# Patient Record
Sex: Female | Born: 1940 | Race: White | Hispanic: No | Marital: Married | Smoking: Former smoker
Health system: Southern US, Community
[De-identification: ages and names within clinical notes are randomized; demographics above are authoritative.]

## PROBLEM LIST (undated history)

## (undated) DIAGNOSIS — G43909 Migraine, unspecified, not intractable, without status migrainosus: Secondary | ICD-10-CM

## (undated) DIAGNOSIS — H409 Unspecified glaucoma: Secondary | ICD-10-CM

## (undated) DIAGNOSIS — J449 Chronic obstructive pulmonary disease, unspecified: Secondary | ICD-10-CM

## (undated) DIAGNOSIS — I10 Essential (primary) hypertension: Secondary | ICD-10-CM

## (undated) DIAGNOSIS — M8580 Other specified disorders of bone density and structure, unspecified site: Secondary | ICD-10-CM

## (undated) DIAGNOSIS — F5104 Psychophysiologic insomnia: Secondary | ICD-10-CM

## (undated) DIAGNOSIS — M545 Low back pain, unspecified: Secondary | ICD-10-CM

## (undated) DIAGNOSIS — E669 Obesity, unspecified: Secondary | ICD-10-CM

## (undated) DIAGNOSIS — R0602 Shortness of breath: Secondary | ICD-10-CM

## (undated) DIAGNOSIS — F0390 Unspecified dementia without behavioral disturbance: Secondary | ICD-10-CM

## (undated) DIAGNOSIS — T7840XA Allergy, unspecified, initial encounter: Secondary | ICD-10-CM

## (undated) DIAGNOSIS — J9383 Other pneumothorax: Secondary | ICD-10-CM

## (undated) DIAGNOSIS — K429 Umbilical hernia without obstruction or gangrene: Principal | ICD-10-CM

## (undated) DIAGNOSIS — J189 Pneumonia, unspecified organism: Secondary | ICD-10-CM

## (undated) DIAGNOSIS — M419 Scoliosis, unspecified: Secondary | ICD-10-CM

## (undated) DIAGNOSIS — K5730 Diverticulosis of large intestine without perforation or abscess without bleeding: Secondary | ICD-10-CM

## (undated) DIAGNOSIS — K219 Gastro-esophageal reflux disease without esophagitis: Secondary | ICD-10-CM

## (undated) DIAGNOSIS — E785 Hyperlipidemia, unspecified: Secondary | ICD-10-CM

## (undated) DIAGNOSIS — Z79890 Hormone replacement therapy (postmenopausal): Secondary | ICD-10-CM

## (undated) DIAGNOSIS — F419 Anxiety disorder, unspecified: Secondary | ICD-10-CM

## (undated) DIAGNOSIS — M1990 Unspecified osteoarthritis, unspecified site: Secondary | ICD-10-CM

## (undated) DIAGNOSIS — D649 Anemia, unspecified: Secondary | ICD-10-CM

## (undated) HISTORY — DX: Migraine, unspecified, not intractable, without status migrainosus: G43.909

## (undated) HISTORY — DX: Low back pain: M54.5

## (undated) HISTORY — PX: HIP ADDUCTOR TENOTOMY: SHX1747

## (undated) HISTORY — PX: SHOULDER ARTHROSCOPY: SHX128

## (undated) HISTORY — PX: LUMBAR DISC SURGERY: SHX700

## (undated) HISTORY — DX: Essential (primary) hypertension: I10

## (undated) HISTORY — DX: Other pneumothorax: J93.83

## (undated) HISTORY — DX: Chronic obstructive pulmonary disease, unspecified: J44.9

## (undated) HISTORY — DX: Hyperlipidemia, unspecified: E78.5

## (undated) HISTORY — PX: OTHER SURGICAL HISTORY: SHX169

## (undated) HISTORY — DX: Unspecified glaucoma: H40.9

## (undated) HISTORY — DX: Obesity, unspecified: E66.9

## (undated) HISTORY — DX: Psychophysiologic insomnia: F51.04

## (undated) HISTORY — DX: Other specified disorders of bone density and structure, unspecified site: M85.80

## (undated) HISTORY — DX: Diverticulosis of large intestine without perforation or abscess without bleeding: K57.30

## (undated) HISTORY — DX: Unspecified osteoarthritis, unspecified site: M19.90

## (undated) HISTORY — DX: Anxiety disorder, unspecified: F41.9

## (undated) HISTORY — DX: Allergy, unspecified, initial encounter: T78.40XA

## (undated) HISTORY — DX: Hormone replacement therapy: Z79.890

## (undated) HISTORY — DX: Umbilical hernia without obstruction or gangrene: K42.9

## (undated) HISTORY — PX: TONSILLECTOMY: SUR1361

## (undated) HISTORY — PX: TONSILLECTOMY: SHX5217

## (undated) HISTORY — PX: EYE SURGERY: SHX253

## (undated) HISTORY — DX: Low back pain, unspecified: M54.50

## (undated) HISTORY — PX: WRIST FRACTURE SURGERY: SHX121

## (undated) HISTORY — DX: Gastro-esophageal reflux disease without esophagitis: K21.9

---

## 1999-07-10 ENCOUNTER — Ambulatory Visit (HOSPITAL_COMMUNITY): Admission: RE | Admit: 1999-07-10 | Discharge: 1999-07-10 | Payer: Self-pay | Admitting: Internal Medicine

## 1999-07-10 ENCOUNTER — Encounter: Payer: Self-pay | Admitting: Internal Medicine

## 1999-10-05 ENCOUNTER — Other Ambulatory Visit: Admission: RE | Admit: 1999-10-05 | Discharge: 1999-10-05 | Payer: Self-pay | Admitting: Internal Medicine

## 2000-10-12 ENCOUNTER — Ambulatory Visit (HOSPITAL_COMMUNITY): Admission: RE | Admit: 2000-10-12 | Discharge: 2000-10-12 | Payer: Self-pay | Admitting: Internal Medicine

## 2000-10-12 ENCOUNTER — Encounter: Payer: Self-pay | Admitting: Internal Medicine

## 2000-11-09 ENCOUNTER — Other Ambulatory Visit: Admission: RE | Admit: 2000-11-09 | Discharge: 2000-11-09 | Payer: Self-pay | Admitting: Internal Medicine

## 2002-06-10 ENCOUNTER — Encounter: Payer: Self-pay | Admitting: Internal Medicine

## 2002-06-10 ENCOUNTER — Encounter: Admission: RE | Admit: 2002-06-10 | Discharge: 2002-06-10 | Payer: Self-pay | Admitting: Internal Medicine

## 2002-06-26 ENCOUNTER — Encounter: Payer: Self-pay | Admitting: Internal Medicine

## 2002-06-26 ENCOUNTER — Encounter: Admission: RE | Admit: 2002-06-26 | Discharge: 2002-06-26 | Payer: Self-pay | Admitting: Internal Medicine

## 2002-07-17 ENCOUNTER — Encounter: Payer: Self-pay | Admitting: Specialist

## 2002-07-25 ENCOUNTER — Encounter (INDEPENDENT_AMBULATORY_CARE_PROVIDER_SITE_OTHER): Payer: Self-pay | Admitting: Specialist

## 2002-07-25 ENCOUNTER — Observation Stay (HOSPITAL_COMMUNITY): Admission: RE | Admit: 2002-07-25 | Discharge: 2002-07-26 | Payer: Self-pay | Admitting: Specialist

## 2002-07-25 ENCOUNTER — Encounter: Payer: Self-pay | Admitting: Specialist

## 2003-03-18 ENCOUNTER — Ambulatory Visit (HOSPITAL_COMMUNITY): Admission: RE | Admit: 2003-03-18 | Discharge: 2003-03-18 | Payer: Self-pay | Admitting: Ophthalmology

## 2003-07-18 ENCOUNTER — Other Ambulatory Visit: Admission: RE | Admit: 2003-07-18 | Discharge: 2003-07-18 | Payer: Self-pay | Admitting: Internal Medicine

## 2003-12-30 ENCOUNTER — Inpatient Hospital Stay (HOSPITAL_COMMUNITY): Admission: RE | Admit: 2003-12-30 | Discharge: 2003-12-31 | Payer: Self-pay | Admitting: Specialist

## 2004-07-31 ENCOUNTER — Encounter: Admission: RE | Admit: 2004-07-31 | Discharge: 2004-07-31 | Payer: Self-pay | Admitting: Internal Medicine

## 2004-08-10 ENCOUNTER — Encounter: Admission: RE | Admit: 2004-08-10 | Discharge: 2004-08-10 | Payer: Self-pay | Admitting: Internal Medicine

## 2004-12-29 ENCOUNTER — Ambulatory Visit: Payer: Self-pay | Admitting: Internal Medicine

## 2005-01-28 ENCOUNTER — Ambulatory Visit: Payer: Self-pay | Admitting: Internal Medicine

## 2005-08-05 ENCOUNTER — Encounter: Admission: RE | Admit: 2005-08-05 | Discharge: 2005-08-05 | Payer: Self-pay | Admitting: Internal Medicine

## 2005-08-24 ENCOUNTER — Ambulatory Visit: Payer: Self-pay | Admitting: Internal Medicine

## 2006-09-08 ENCOUNTER — Encounter: Admission: RE | Admit: 2006-09-08 | Discharge: 2006-09-08 | Payer: Self-pay | Admitting: Internal Medicine

## 2007-01-10 ENCOUNTER — Ambulatory Visit: Payer: Self-pay | Admitting: Internal Medicine

## 2007-01-10 LAB — CONVERTED CEMR LAB
ALT: 14 units/L (ref 0–40)
AST: 29 units/L (ref 0–37)
Alkaline Phosphatase: 128 units/L — ABNORMAL HIGH (ref 39–117)
Basophils Relative: 0.8 % (ref 0.0–1.0)
Bilirubin, Direct: 0.1 mg/dL (ref 0.0–0.3)
CO2: 29 meq/L (ref 19–32)
Calcium: 9.3 mg/dL (ref 8.4–10.5)
Creatinine, Ser: 0.8 mg/dL (ref 0.4–1.2)
Eosinophils Relative: 3.3 % (ref 0.0–5.0)
GFR calc Af Amer: 93 mL/min
Glucose, Bld: 113 mg/dL — ABNORMAL HIGH (ref 70–99)
HDL: 39.6 mg/dL (ref >39.0)
Hemoglobin: 13.4 g/dL (ref 12.0–15.0)
Lymphocytes Relative: 20.9 % (ref 12.0–46.0)
Neutro Abs: 7.3 10*3/uL (ref 1.4–7.7)
Platelets: 323 10*3/uL (ref 150–400)
RDW: 12.5 % (ref 11.5–14.6)
Total Bilirubin: 0.5 mg/dL (ref 0.3–1.2)
Total CHOL/HDL Ratio: 4.2
Total Protein: 6.8 g/dL (ref 6.0–8.3)
VLDL: 54 mg/dL — ABNORMAL HIGH (ref 0–40)
WBC: 10.4 10*3/uL (ref 4.5–10.5)

## 2007-01-23 ENCOUNTER — Ambulatory Visit: Payer: Self-pay | Admitting: Internal Medicine

## 2007-03-02 ENCOUNTER — Ambulatory Visit: Payer: Self-pay | Admitting: Internal Medicine

## 2007-03-02 LAB — CONVERTED CEMR LAB
ALT: 16 units/L (ref 0–40)
AST: 26 units/L (ref 0–37)
Albumin: 3.5 g/dL (ref 3.5–5.2)
Alkaline Phosphatase: 154 units/L — ABNORMAL HIGH (ref 39–117)
Cholesterol: 147 mg/dL (ref 0–200)
Total CHOL/HDL Ratio: 4
VLDL: 73 mg/dL — ABNORMAL HIGH (ref 0–40)

## 2007-03-28 ENCOUNTER — Ambulatory Visit: Payer: Self-pay | Admitting: Internal Medicine

## 2007-09-18 ENCOUNTER — Encounter: Admission: RE | Admit: 2007-09-18 | Discharge: 2007-09-18 | Payer: Self-pay | Admitting: Internal Medicine

## 2007-10-01 ENCOUNTER — Encounter: Payer: Self-pay | Admitting: Internal Medicine

## 2007-10-01 DIAGNOSIS — E663 Overweight: Secondary | ICD-10-CM | POA: Insufficient documentation

## 2007-10-01 DIAGNOSIS — G43909 Migraine, unspecified, not intractable, without status migrainosus: Secondary | ICD-10-CM | POA: Insufficient documentation

## 2007-10-01 DIAGNOSIS — M1990 Unspecified osteoarthritis, unspecified site: Secondary | ICD-10-CM | POA: Insufficient documentation

## 2007-10-01 DIAGNOSIS — J939 Pneumothorax, unspecified: Secondary | ICD-10-CM | POA: Insufficient documentation

## 2007-10-01 DIAGNOSIS — M810 Age-related osteoporosis without current pathological fracture: Secondary | ICD-10-CM | POA: Insufficient documentation

## 2007-10-01 DIAGNOSIS — J930 Spontaneous tension pneumothorax: Secondary | ICD-10-CM | POA: Insufficient documentation

## 2007-10-01 DIAGNOSIS — E785 Hyperlipidemia, unspecified: Secondary | ICD-10-CM

## 2007-10-01 DIAGNOSIS — M545 Low back pain: Secondary | ICD-10-CM

## 2007-10-01 DIAGNOSIS — I10 Essential (primary) hypertension: Secondary | ICD-10-CM | POA: Insufficient documentation

## 2007-10-01 DIAGNOSIS — I87309 Chronic venous hypertension (idiopathic) without complications of unspecified lower extremity: Secondary | ICD-10-CM | POA: Insufficient documentation

## 2007-10-01 DIAGNOSIS — K219 Gastro-esophageal reflux disease without esophagitis: Secondary | ICD-10-CM | POA: Insufficient documentation

## 2007-11-15 ENCOUNTER — Ambulatory Visit: Payer: Self-pay | Admitting: Internal Medicine

## 2008-09-21 ENCOUNTER — Ambulatory Visit: Payer: Self-pay | Admitting: Family Medicine

## 2008-09-21 DIAGNOSIS — J069 Acute upper respiratory infection, unspecified: Secondary | ICD-10-CM | POA: Insufficient documentation

## 2008-10-01 ENCOUNTER — Encounter: Admission: RE | Admit: 2008-10-01 | Discharge: 2008-10-01 | Payer: Self-pay | Admitting: Internal Medicine

## 2008-12-12 ENCOUNTER — Ambulatory Visit: Payer: Self-pay | Admitting: Internal Medicine

## 2008-12-12 DIAGNOSIS — R5383 Other fatigue: Secondary | ICD-10-CM

## 2008-12-12 DIAGNOSIS — R5381 Other malaise: Secondary | ICD-10-CM

## 2008-12-13 LAB — CONVERTED CEMR LAB
Bilirubin, Direct: 0.1 mg/dL (ref 0.0–0.3)
CO2: 31 meq/L (ref 19–32)
Calcium: 9.6 mg/dL (ref 8.4–10.5)
Chloride: 104 meq/L (ref 96–112)
Creatinine, Ser: 0.7 mg/dL (ref 0.4–1.2)
Eosinophils Relative: 4.4 % (ref 0.0–5.0)
Folate: >20 ng/mL
GFR calc non Af Amer: 89 mL/min
HCT: 36.7 % (ref 36.0–46.0)
HDL: 37.4 mg/dL — ABNORMAL LOW (ref >39.0)
Hgb A1c MFr Bld: 6.2 % — ABNORMAL HIGH (ref 4.6–6.0)
LDL Cholesterol: 55 mg/dL (ref 0–99)
Monocytes Absolute: 0.5 10*3/uL (ref 0.1–1.0)
Monocytes Relative: 6.9 % (ref 3.0–12.0)
Neutrophils Relative %: 53.7 % (ref 43.0–77.0)
Platelets: 296 10*3/uL (ref 150–400)
RDW: 12.7 % (ref 11.5–14.6)
Sed Rate: 29 mm/hr — ABNORMAL HIGH (ref 0–22)
Sodium: 141 meq/L (ref 135–145)
Total Bilirubin: 0.7 mg/dL (ref 0.3–1.2)
Total CHOL/HDL Ratio: 3.5
Triglycerides: 191 mg/dL — ABNORMAL HIGH (ref 0–149)
VLDL: 38 mg/dL (ref 0–40)
Vit D, 1,25-Dihydroxy: 33 (ref 30–89)
Vitamin B-12: 377 pg/mL (ref 211–911)
WBC: 7.8 10*3/uL (ref 4.5–10.5)

## 2008-12-16 ENCOUNTER — Telehealth: Payer: Self-pay | Admitting: Internal Medicine

## 2008-12-16 ENCOUNTER — Telehealth (INDEPENDENT_AMBULATORY_CARE_PROVIDER_SITE_OTHER): Payer: Self-pay | Admitting: *Deleted

## 2009-02-06 ENCOUNTER — Ambulatory Visit: Payer: Self-pay | Admitting: Internal Medicine

## 2009-02-06 DIAGNOSIS — E119 Type 2 diabetes mellitus without complications: Secondary | ICD-10-CM

## 2009-02-10 ENCOUNTER — Telehealth (INDEPENDENT_AMBULATORY_CARE_PROVIDER_SITE_OTHER): Payer: Self-pay | Admitting: *Deleted

## 2009-03-03 ENCOUNTER — Ambulatory Visit: Payer: Self-pay | Admitting: Gastroenterology

## 2009-03-17 ENCOUNTER — Ambulatory Visit: Payer: Self-pay | Admitting: Gastroenterology

## 2009-03-17 ENCOUNTER — Encounter: Payer: Self-pay | Admitting: Internal Medicine

## 2009-03-17 ENCOUNTER — Encounter: Payer: Self-pay | Admitting: Gastroenterology

## 2009-03-17 LAB — HM COLONOSCOPY

## 2009-03-18 ENCOUNTER — Encounter: Payer: Self-pay | Admitting: Gastroenterology

## 2009-05-27 ENCOUNTER — Ambulatory Visit: Payer: Self-pay | Admitting: Internal Medicine

## 2009-05-27 DIAGNOSIS — J449 Chronic obstructive pulmonary disease, unspecified: Secondary | ICD-10-CM | POA: Insufficient documentation

## 2009-06-09 ENCOUNTER — Ambulatory Visit: Payer: Self-pay | Admitting: Internal Medicine

## 2009-10-02 ENCOUNTER — Encounter: Admission: RE | Admit: 2009-10-02 | Discharge: 2009-10-02 | Payer: Self-pay | Admitting: Internal Medicine

## 2009-10-29 ENCOUNTER — Telehealth: Payer: Self-pay | Admitting: Internal Medicine

## 2009-11-04 ENCOUNTER — Ambulatory Visit: Payer: Self-pay | Admitting: Internal Medicine

## 2009-11-04 DIAGNOSIS — J019 Acute sinusitis, unspecified: Secondary | ICD-10-CM

## 2009-12-02 ENCOUNTER — Ambulatory Visit: Payer: Self-pay | Admitting: Internal Medicine

## 2009-12-02 DIAGNOSIS — F411 Generalized anxiety disorder: Secondary | ICD-10-CM | POA: Insufficient documentation

## 2009-12-02 DIAGNOSIS — M549 Dorsalgia, unspecified: Secondary | ICD-10-CM | POA: Insufficient documentation

## 2009-12-02 LAB — CONVERTED CEMR LAB
BUN: 7 mg/dL (ref 6–23)
Basophils Absolute: 0.1 10*3/uL (ref 0.0–0.1)
Basophils Relative: 0.8 % (ref 0.0–3.0)
CO2: 29 meq/L (ref 19–32)
Calcium: 9.7 mg/dL (ref 8.4–10.5)
Chloride: 102 meq/L (ref 96–112)
Cholesterol: 156 mg/dL (ref 0–200)
Creatinine, Ser: 0.7 mg/dL (ref 0.4–1.2)
Direct LDL: 70.8 mg/dL
Eosinophils Relative: 3.8 % (ref 0.0–5.0)
Glucose, Bld: 112 mg/dL — ABNORMAL HIGH (ref 70–99)
Hemoglobin: 12.9 g/dL (ref 12.0–15.0)
Lymphocytes Relative: 32.7 % (ref 12.0–46.0)
Monocytes Relative: 7.5 % (ref 3.0–12.0)
Neutro Abs: 4.5 10*3/uL (ref 1.4–7.7)
RBC: 3.93 M/uL (ref 3.87–5.11)
RDW: 12.4 % (ref 11.5–14.6)
Saturation Ratios: 24.5 % (ref 20.0–50.0)
Transferrin: 204.3 mg/dL — ABNORMAL LOW (ref 212.0–360.0)
Triglycerides: 310 mg/dL — ABNORMAL HIGH (ref 0.0–149.0)
WBC: 8.2 10*3/uL (ref 4.5–10.5)

## 2010-10-05 ENCOUNTER — Encounter: Admission: RE | Admit: 2010-10-05 | Discharge: 2010-10-05 | Payer: Self-pay | Admitting: Internal Medicine

## 2010-10-29 ENCOUNTER — Telehealth: Payer: Self-pay | Admitting: Internal Medicine

## 2010-11-18 ENCOUNTER — Encounter: Payer: Self-pay | Admitting: Internal Medicine

## 2010-11-18 ENCOUNTER — Ambulatory Visit: Payer: Self-pay | Admitting: Internal Medicine

## 2010-11-18 DIAGNOSIS — M899 Disorder of bone, unspecified: Secondary | ICD-10-CM | POA: Insufficient documentation

## 2010-11-18 DIAGNOSIS — K5730 Diverticulosis of large intestine without perforation or abscess without bleeding: Secondary | ICD-10-CM | POA: Insufficient documentation

## 2010-11-18 DIAGNOSIS — Q2733 Arteriovenous malformation of digestive system vessel: Secondary | ICD-10-CM

## 2010-11-18 DIAGNOSIS — M949 Disorder of cartilage, unspecified: Secondary | ICD-10-CM

## 2010-11-18 DIAGNOSIS — M542 Cervicalgia: Secondary | ICD-10-CM | POA: Insufficient documentation

## 2010-11-18 LAB — CONVERTED CEMR LAB
AST: 29 units/L (ref 0–37)
Albumin: 3.9 g/dL (ref 3.5–5.2)
Alkaline Phosphatase: 99 units/L (ref 39–117)
Basophils Relative: 0.9 % (ref 0.0–3.0)
CO2: 30 meq/L (ref 19–32)
Calcium: 9.8 mg/dL (ref 8.4–10.5)
Direct LDL: 100.8 mg/dL
HDL: 41.2 mg/dL (ref >39.00)
Hemoglobin: 13.3 g/dL (ref 12.0–15.0)
Lymphocytes Relative: 23.6 % (ref 12.0–46.0)
MCHC: 34.2 g/dL (ref 30.0–36.0)
Monocytes Relative: 6 % (ref 3.0–12.0)
Neutro Abs: 8.2 10*3/uL — ABNORMAL HIGH (ref 1.4–7.7)
Potassium: 4.9 meq/L (ref 3.5–5.1)
RBC: 3.99 M/uL (ref 3.87–5.11)
Sodium: 142 meq/L (ref 135–145)
Total CHOL/HDL Ratio: 4
Total Protein: 6.8 g/dL (ref 6.0–8.3)
Triglycerides: 251 mg/dL — ABNORMAL HIGH (ref 0.0–149.0)

## 2010-12-20 ENCOUNTER — Encounter: Payer: Self-pay | Admitting: Internal Medicine

## 2010-12-31 NOTE — Progress Notes (Signed)
Phone Note Refill Request Message from:  Fax from Pharmacy on October 29, 2010 8:52 AM  Refills Requested: Medication #1:  SIMVASTATIN 40 MG TABS Take 1 tablet by mouth once a day   Dosage confirmed as above?Dosage Confirmed   Last Refilled: 10/31/2009   Notes: [facility]  Medication #2:  ATENOLOL 50 MG TABS 1 by mouth once daily   Dosage confirmed as above?Dosage Confirmed   Last Refilled: 10/31/2009   Notes: [facility]  Medication #3:  BENAZEPRIL HCL 40 MG TABS 1po once daily   Dosage confirmed as above?Dosage Confirmed   Last Refilled: 10/31/2009   Notes: [facility]  Medication #4:  OMEPRAZOLE 20 MG CPDR Take 2 capsule by mouth once a day   Dosage confirmed as above?Dosage Confirmed   Last Refilled: 10/31/2009   Notes: [facility] Initial call taken by: Robin Ewing CMA (AAMA),  October 29, 2010 8:53 AM    Prescriptions: SIMVASTATIN 40 MG TABS (SIMVASTATIN) Take 1 tablet by mouth once a day  #90 x 0   Entered by:   Scharlene Gloss CMA (AAMA)   Authorized by:   Corwin Levins MD   Signed by:   Scharlene Gloss CMA (AAMA) on 10/29/2010   Method used:   Faxed to ...       [facility] (mail-order)             , [place]         Ph:        Fax: [phone]   RxID:   [id number] OMEPRAZOLE 20 MG CPDR (OMEPRAZOLE) Take 2 capsule by mouth once a day  #60 x 0   Entered by:   Zella Ball Ewing CMA (AAMA)   Authorized by:   Corwin Levins MD   Signed by:   Scharlene Gloss CMA (AAMA) on 10/29/2010   Method used:   Faxed to ...       [facility] (mail-order)             , [place]         Ph:        Fax: [phone]   RxID:   [id number] MELOXICAM 15 MG TABS (MELOXICAM) Take 1 tablet by mouth once a day  #90 x 0   Entered by:   Zella Ball Ewing CMA (AAMA)   Authorized by:   Corwin Levins MD   Signed by:   Scharlene Gloss CMA (AAMA) on 10/29/2010   Method used:   Faxed to ...       [facility] (mail-order)             , [place]         Ph:        Fax: [phone]   RxID:    [id number] FUROSEMIDE 40 MG TABS (FUROSEMIDE) 1 tablet by mouth once a day  #90 x 0   Entered by:   Zella Ball Ewing CMA (AAMA)   Authorized by:   Corwin Levins MD   Signed by:   Scharlene Gloss CMA (AAMA) on 10/29/2010   Method used:   Faxed to ...       [facility] (mail-order)             , [place]         Ph:        Fax: [phone]   RxID:   [id number] BENAZEPRIL HCL 40 MG TABS (BENAZEPRIL HCL) 1po once daily  #90 x 0   Entered  by:   Scharlene Gloss CMA (AAMA)   Authorized by:   Corwin Levins MD   Signed by:   Scharlene Gloss CMA (AAMA) on 10/29/2010   Method used:   Faxed to ...       [facility] (mail-order)             , [place]         Ph:        Fax: [phone]   RxID:   [id number] ATENOLOL 50 MG TABS (ATENOLOL) 1 by mouth once daily  #90 x 0   Entered by:   Zella Ball Ewing CMA (AAMA)   Authorized by:   Corwin Levins MD   Signed by:   Scharlene Gloss CMA (AAMA) on 10/29/2010   Method used:   Faxed to ...       [facility] (mail-order)             , [place]         Ph:        Fax: [phone]   RxID:   [id number]

## 2010-12-31 NOTE — Assessment & Plan Note (Signed)
Summary: neck pain/cd   Vital Signs:  Patient profile:   70 year old female Height:      64 inches Weight:      167 pounds BMI:     28.77 O2 Sat:      98 % on Room air Temp:     98.4 degrees F oral Pulse rate:   74 / minute BP sitting:   172 / 100  (left arm) Cuff size:   regular  Vitals Entered ByZella Ball Ewing (December 02, 2009 11:12 AM)  O2 Flow:  Room air CC: neck pain/RE   CC:  neck pain/RE.  History of Present Illness: here with 1 wk right side post neck pain with radiation to the right shoulder only - does not go further but does have some tingling to the shoulder area aswell;  no ohter RUE pain, weakness, or numbness; severity apporox 8/10;  worse at night to lie at night and not as bad during the day but still constant;  incrased pillows and heating pad and icy hot no help,  worst  is trygin to lie flat so trying to sleep with head and shoulder somewhat elevated;  some pain does radiate to the right occiput area;  massage at the begnning did seem to help somewhat but "came right back";  c/o increased stress before pain started related to family issues and ex-duaghter in law;  the 9yo child stays with the pt part time of the week;  no falls or heavy lifing prior to pain, and no fever, wt loss, and no injury involved.  Does have some "burning up" all the time "for years"  Has FROM of the right shoudler but can "feel worse" to the right upper back. Pt denies CP, sob, doe, wheezing, orthopnea, pnd, worsening LE edema, palps, dizziness or syncope  Pt denies new neuro symptoms such as headache, facial or extremity weakness , although she does c/o increased forgetfullness, but not clear if from increased stress.   Pt denies polydipsia, polyuria, or low sugar symptoms such as shakiness improved with eating.  Overall good compliance with meds, trying to follow low chol, DM diet, wt stable, little excercise however   Problems Prior to Update: 1)  Fatigue  (ICD-780.79) 2)  Anxiety   (ICD-300.00) 3)  Back Pain  (ICD-724.5) 4)  Sinusitis- Acute-nos  (ICD-461.9) 5)  COPD  (ICD-496) 6)  Routine General Medical Exam@[facility]  (ICD-V70.0) 7)  Diabetes Mellitus, Type II  (ICD-250.00) 8)  Fatigue  (ICD-780.79) 9)  Uri  (ICD-465.9) 10)  Postmenopausal On Hormone Replacement Therapy  (ICD-V07.4) 11)  Osteoporosis  (ICD-733.00) 12)  Low Back Pain  (ICD-724.2) 13)  Osteoarthritis  (ICD-715.90) 14)  Gerd  (ICD-530.81) 15)  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 1 By Mouth Once Daily 2)  Benazepril Hcl 40 Mg Tabs (Benazepril Hcl) .Marland Kitchen.. 1po Once Daily 3)  Effexor Xr 150 Mg Cp24 (Venlafaxine Hcl) .Marland Kitchen.. 1 By Mouth Two Times A Day 4)  Fosamax 70 Mg Tabs (Alendronate Sodium) .Marland Kitchen.. 1 Tablet By Mouth Once A Week 5)  Furosemide 40 Mg Tabs (Furosemide) .Marland Kitchen.. 1 Tablet By Mouth Once A Day 6)  Meloxicam 15 Mg Tabs (Meloxicam) .... Take 1 Tablet By Mouth Once A  Day 7)  Omeprazole 20 Mg Cpdr (Omeprazole) .... Take 2 Capsule By Mouth Once A Day 8)  Simvastatin 40 Mg Tabs (Simvastatin) .... Take 1 Tablet By Mouth Once A Day 9)  Cetirizine Hcl 10 Mg  Tabs (Cetirizine Hcl) .Marland Kitchen.. 1 By Mouth Once Daily Prn 10)  Ecotrin Low Strength 81 Mg  Tbec (Aspirin) .Marland Kitchen.. 1 By Mouth Qd 11)  Promethazine-Codeine 6.25-10 Mg/62ml Syrp (Promethazine-Codeine) .Marland Kitchen.. 1 Tsp By Mouth Q 6 Hrs As Needed Cough 12)  Clarithromycin 500 Mg Tabs (Clarithromycin) .Marland Kitchen.. 1 By Mouth Two Times A Day  Current Medications (verified): 1)  Atenolol 50 Mg Tabs (Atenolol) .Marland Kitchen.. 1 By Mouth Once Daily 2)  Benazepril Hcl 40 Mg Tabs (Benazepril Hcl) .Marland Kitchen.. 1po Once Daily 3)  Effexor Xr 150 Mg Cp24 (Venlafaxine Hcl) .Marland Kitchen.. 1 By Mouth Two Times A Day 4)  Fosamax 70 Mg Tabs  (Alendronate Sodium) .Marland Kitchen.. 1 Tablet By Mouth Once A Week 5)  Furosemide 40 Mg Tabs (Furosemide) .Marland Kitchen.. 1 Tablet By Mouth Once A Day 6)  Meloxicam 15 Mg Tabs (Meloxicam) .... Take 1 Tablet By Mouth Once A Day 7)  Omeprazole 20 Mg Cpdr (Omeprazole) .... Take 2 Capsule By Mouth Once A Day 8)  Simvastatin 40 Mg Tabs (Simvastatin) .... Take 1 Tablet By Mouth Once A Day 9)  Cetirizine Hcl 10 Mg  Tabs (Cetirizine Hcl) .Marland Kitchen.. 1 By Mouth Once Daily Prn 10)  Ecotrin Low Strength 81 Mg  Tbec (Aspirin) .Marland Kitchen.. 1 By Mouth Qd 11)  Flexeril 5 Mg Tabs (Cyclobenzaprine Hcl) .Marland Kitchen.. 1po Three Times A Day As Needed Pain 12)  Hydrocodone-Acetaminophen 5-325 Mg Tabs (Hydrocodone-Acetaminophen) .Marland Kitchen.. 1po Q 6 Hrs As Needed 13)  Diazepam 5 Mg Tabs (Diazepam) .... 1/2 - 1 By Mouth Two Times A Day As Needed 14)  Prednisone 10 Mg Tabs (Prednisone) .... 3po Qd For 3days, Then 2po Qd For 3days, Then 1po Qd For 3days, Then Stop  Allergies (verified): 1)  ! Pcn 2)  ! Codeine 3)  ! 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   Head:  normocephalic and atraumatic.   Eyes:  vision grossly intact, pupils equal, and pupils round.   Ears:  R ear normal and L ear normal.   Nose:  no  external deformity and no nasal discharge.   Mouth:  no gingival abnormalities and pharynx pink and moist.   Neck:  supple and no masses.  and decreased ROM to horizontal turning head to right by approx 10 - 20 degrees Lungs:  normal respiratory effort and normal breath sounds.   Heart:  normal rate and regular rhythm.   Abdomen:  soft, non-tender, and normal bowel sounds.   Msk:  has mild decrased ROM to turn head to right horizontal by about 10 degress, o/w cervical FROM; no spine tender throughout; has marked tender right  trapezoid diffusely Extremities:  no edema, no erythema  Neurologic:  cranial nerves II-XII intact, strength normal in all extremities, and sensation intact to light touch.   Psych:  moderately anxious.     Impression & Recommendations:  Problem # 1:  BACK PAIN (ICD-724.5)  Her updated medication list for this problem includes:    Meloxicam 15 Mg Tabs (Meloxicam) .Marland Kitchen... Take 1 tablet by mouth once a day    Ecotrin Low Strength 81 Mg Tbec (Aspirin) .Marland Kitchen... 1 by mouth qd    Flexeril 5 Mg Tabs (Cyclobenzaprine hcl) .Marland Kitchen... 1po three times a day as needed pain    Hydrocodone-acetaminophen 5-325 Mg Tabs (Hydrocodone-acetaminophen) .Marland Kitchen... 1po q 6 hrs as needed c/w severe trapezoid strain with some ? of underloying cervical DJD and or even radiculitis - for now tx with pain med, muscle relaxer, and pred pack; to f/u any worsening s/s   Problem # 2:  ANXIETY (ICD-300.00)  Her updated medication list for this problem includes:    Effexor Xr 150 Mg Cp24 (Venlafaxine hcl) .Marland Kitchen... 1 by mouth two times a day    Diazepam 5 Mg Tabs (Diazepam) .Marland Kitchen... 1/2 - 1 by mouth two times a day as needed for diazepam two times a day as needed, lower dose, hopeful short term pending improvememnt of family issues, declines counseling at this time. Continue all previous medications as before this visit   Problem # 3:  DIABETES MELLITUS, TYPE II (ICD-250.00)  Her updated medication list for this problem  includes:    Benazepril Hcl 40 Mg Tabs (Benazepril hcl) .Marland Kitchen... 1po once daily    Ecotrin Low Strength 81 Mg Tbec (Aspirin) .Marland Kitchen... 1 by mouth qd  Orders: TLB-Lipid Panel (80061-LIPID) TLB-BMP (Basic Metabolic Panel-BMET) (80048-METABOL) TLB-A1C / Hgb A1C (Glycohemoglobin) (83036-A1C)  Labs Reviewed: Creat: 0.7 (12/12/2008)    Reviewed HgBA1c results: 6.2 (12/12/2008) stable overall by hx and exam, ok to continue meds/tx as is   Problem # 4:  FATIGUE (ICD-780.79) exam benign, to check labs below; follow with expectant management   Orders: TLB-Sedimentation Rate (ESR) (85652-ESR) TLB-TSH (Thyroid Stimulating Hormone) (84443-TSH) TLB-IBC Pnl (Iron/FE;Transferrin) (83550-IBC) TLB-CBC Platelet - w/Differential (85025-CBCD) TLB-B12 + Folate Pnl (16109_60454-U98/JXB)  Complete Medication List: 1)  Atenolol 50 Mg Tabs (Atenolol) .Marland Kitchen.. 1 by mouth once daily 2)  Benazepril Hcl 40 Mg Tabs (Benazepril hcl) .Marland Kitchen.. 1po once daily 3)  Effexor Xr 150 Mg Cp24 (Venlafaxine hcl) .Marland Kitchen.. 1 by mouth two times a day 4)  Fosamax 70 Mg Tabs (Alendronate sodium) .Marland Kitchen.. 1 tablet by mouth once a week 5)  Furosemide 40 Mg Tabs (Furosemide) .Marland Kitchen.. 1 tablet by mouth once a day 6)  Meloxicam 15 Mg Tabs (Meloxicam) .... Take 1 tablet by mouth once a day 7)  Omeprazole 20 Mg Cpdr (Omeprazole) .... Take 2 capsule by mouth once a day 8)  Simvastatin 40 Mg Tabs (Simvastatin) .... Take 1 tablet by mouth once a day 9)  Cetirizine Hcl 10 Mg Tabs (Cetirizine hcl) .Marland Kitchen.. 1 by mouth once daily prn 10)  Ecotrin Low Strength 81 Mg Tbec (Aspirin) .Marland Kitchen.. 1 by mouth qd 11)  Flexeril 5 Mg Tabs (Cyclobenzaprine hcl) .Marland Kitchen.. 1po three times a day as needed pain 12)  Hydrocodone-acetaminophen 5-325 Mg Tabs (Hydrocodone-acetaminophen) .Marland Kitchen.. 1po q 6 hrs as needed 13)  Diazepam 5 Mg Tabs (Diazepam) .... 1/2 - 1 by mouth two times a day as needed 14)  Prednisone 10 Mg Tabs (Prednisone) .... 3po qd for 3days, then 2po qd for 3days, then 1po  qd for 3days,  then stop  Patient Instructions: 1)  Please take all new medications as prescribed 2)  Continue all previous medications as before this visit  3)  Please go to the Lab in the basement for your blood and/or urine tests today 4)  Please schedule a follow-up appointment in 6 months or sooner if needed Prescriptions: PREDNISONE 10 MG TABS (PREDNISONE) 3po qd for 3days, then 2po qd for 3days, then 1po qd for 3days, then stop  #18 x 0   Entered and Authorized by:   Corwin Levins MD   Signed by:   Corwin Levins MD on 12/02/2009   Method used:   Print then Give to Patient   RxID:   [id number] DIAZEPAM 5 MG TABS (DIAZEPAM) 1/2 - 1 by mouth two times a day as needed  #60 x 1   Entered and Authorized by:   Corwin Levins MD   Signed by:   Corwin Levins MD on 12/02/2009   Method used:   Print then Give to Patient   RxID:   [id number] FLEXERIL 5 MG TABS (CYCLOBENZAPRINE HCL) 1po three times a day as needed pain  #60 x 1   Entered and Authorized by:   Corwin Levins MD   Signed by:   Corwin Levins MD on 12/02/2009   Method used:   Print then Give to Patient   RxID:   [id number] HYDROCODONE-ACETAMINOPHEN 5-325 MG TABS (HYDROCODONE-ACETAMINOPHEN) 1po q 6 hrs as needed  #60 x 1   Entered and Authorized by:   Corwin Levins MD   Signed by:   Corwin Levins MD on 12/02/2009   Method used:   Print then Give to Patient   RxID:   [id number]

## 2010-12-31 NOTE — Assessment & Plan Note (Signed)
Summary: YEARLY MEDICARE PHYSICAL-LB   Vital Signs:  Patient profile:   70 year old female Height:      64.5 inches Weight:      160.50 pounds BMI:     27.22 O2 Sat:      96 % on Room air Temp:     97.7 degrees F oral Pulse rate:   89 / minute BP sitting:   134 / 82  (left arm) Cuff size:   regular  Vitals Entered By: Zella Ball Ewing CMA Duncan Dull) (November 18, 2010 8:48 AM)  O2 Flow:  Room air  Preventive Care Screening  Bone Density:    Date:  06/11/2009    Next Due:  05/2011    Results:  abnormal std dev  Mammogram:    Next Due:  09/2011  Last Flu Shot:    Date:  09/29/2010    Results:  given   CC: yearly/RE   CC:  yearly/RE.  History of Present Illness: here for f/u; overall doing OK. Pt denies CP, worsening sob, doe, wheezing, orthopnea, pnd, worsening LE edema, palps, dizziness or syncope  Pt denies new neuro symptoms such as headache, facial or extremity weakness  Pt denies polydipsia, polyuria, or low sugar symptoms such as shakiness improved with eating.  Overall good compliance with meds, trying to follow low chol, DM diet, wt stable, little excercise however No fever, wt loss, night sweats, loss of appetite or other constitutional symptoms  Denies worsening depressive symptoms, suicidal ideation, or panic.   Overall good compliance with meds, and good tolerability.  Pt states good ability with ADL's, low fall risk, home safety reviewed and adequate, no significant change in hearing or vision.  Does have what seems to her some spontanesou bruising to the arms only (no other overt bleeding or bruising), has some ongoing problem with getting to sleep as well as getting to sleep, with ongoing anxiety,  also some right neck achy pain, mild, without radiation , intermittent, for 3 whks, without radiatoin, UE pain/weak/numb, bowel or bladder change, gait change, other injury or fall .  Does have ongoing fatigue as well , butr no daytime somnolence  Preventive  Screening-Counseling & Management      Drug Use:  no.    Problems Prior to Update: 1)  Neck Pain, Right  (ICD-723.1) 2)  Arteriovenous Malformation, Colon  (ICD-747.61) 3)  Diverticulosis, Colon  (ICD-562.10) 4)  Osteopenia  (ICD-733.90) 5)  Fatigue  (ICD-780.79) 6)  Anxiety  (ICD-300.00) 7)  Back Pain  (ICD-724.5) 8)  Sinusitis- Acute-nos  (ICD-461.9) 9)  COPD  (ICD-496) 10)  Routine General Medical Exam@[facility]  (ICD-V70.0) 11)  Diabetes Mellitus, Type II  (ICD-250.00) 12)  Fatigue  (ICD-780.79) 13)  Uri  (ICD-465.9) 14)  Postmenopausal On Hormone Replacement Therapy  (ICD-V07.4) 15)  Osteoporosis  (ICD-733.00) 16)  Low Back Pain  (ICD-724.2) 17)  Osteoarthritis  (ICD-715.90) 18)  Gerd  (ICD-530.81) 19)  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 1 By Mouth Once Daily 2)  Benazepril Hcl 40 Mg Tabs (Benazepril Hcl) .Marland Kitchen.. 1po Once Daily 3)  Effexor Xr 150 Mg Cp24 (Venlafaxine Hcl) .Marland Kitchen.. 1 By Mouth Two Times A Day 4)  Fosamax 70 Mg Tabs (Alendronate Sodium) .Marland Kitchen.. 1 Tablet By  Mouth Once A Week 5)  Furosemide 40 Mg Tabs (Furosemide) .Marland Kitchen.. 1 Tablet By Mouth Once A Day 6)  Meloxicam 15 Mg Tabs (Meloxicam) .... Take 1 Tablet By Mouth Once A Day 7)  Omeprazole 20 Mg Cpdr (Omeprazole) .... Take 2 Capsule By Mouth Once A Day 8)  Simvastatin 40 Mg Tabs (Simvastatin) .... Take 1 Tablet By Mouth Once A Day 9)  Cetirizine Hcl 10 Mg  Tabs (Cetirizine Hcl) .Marland Kitchen.. 1 By Mouth Once Daily Prn 10)  Ecotrin Low Strength 81 Mg  Tbec (Aspirin) .Marland Kitchen.. 1 By Mouth Qd 11)  Flexeril 5 Mg Tabs (Cyclobenzaprine Hcl) .Marland Kitchen.. 1po Three Times A Day As Needed Pain 12)  Hydrocodone-Acetaminophen 5-325 Mg Tabs (Hydrocodone-Acetaminophen) .Marland Kitchen.. 1po Q 6  Hrs As Needed 13)  Diazepam 5 Mg Tabs (Diazepam) .... 1/2 - 1 By Mouth Two Times A Day As Needed 14)  Prednisone 10 Mg Tabs (Prednisone) .... 3po Qd For 3days, Then 2po Qd For 3days, Then 1po Qd For 3days, Then Stop  Current Medications (verified): 1)  Atenolol 50 Mg Tabs (Atenolol) .Marland Kitchen.. 1 By Mouth Once Daily 2)  Benazepril Hcl 40 Mg Tabs (Benazepril Hcl) .Marland Kitchen.. 1po Once Daily 3)  Effexor Xr 150 Mg Cp24 (Venlafaxine Hcl) .Marland Kitchen.. 1 By Mouth Two Times A Day 4)  Fosamax 70 Mg Tabs (Alendronate Sodium) .Marland Kitchen.. 1 Tablet By Mouth Once A Week 5)  Furosemide 40 Mg Tabs (Furosemide) .Marland Kitchen.. 1 Tablet By Mouth Once A Day 6)  Meloxicam 15 Mg Tabs (Meloxicam) .... Take 1 Tablet By Mouth Once A Day 7)  Omeprazole 20 Mg Cpdr (Omeprazole) .... Take 2 Capsule By Mouth Once A Day 8)  Simvastatin 40 Mg Tabs (Simvastatin) .... Take 1 Tablet By Mouth Once A Day 9)  Cetirizine Hcl 10 Mg  Tabs (Cetirizine Hcl) .Marland Kitchen.. 1 By Mouth Once Daily Prn 10)  Ecotrin Low Strength 81 Mg  Tbec (Aspirin) .Marland Kitchen.. 1 By Mouth Qd 11)  Flexeril 5 Mg Tabs (Cyclobenzaprine Hcl) .Marland Kitchen.. 1po Three Times A Day As Needed Pain 12)  Hydrocodone-Acetaminophen 5-325 Mg Tabs (Hydrocodone-Acetaminophen) .Marland Kitchen.. 1po Q 6 Hrs As Needed 13)  Prednisone 10 Mg Tabs (Prednisone) .... 3po Qd For 3days, Then 2po Qd For 3days, Then 1po Qd For 3days, Then Stop 14)  Vitamin D3 1000 Unit Caps (Cholecalciferol) .Marland Kitchen.. 1 By Mouth Once Daily 15)  Multivitamins  Tabs (Multiple Vitamin) .Marland Kitchen.. 1 By Mouth Once Daily 16)  Calcium 600 Mg Tabs (Calcium) .Marland Kitchen.. 1 By Mouth Two Times A Day 17)  Fish Oil 1000 Mg Caps (Omega-3 Fatty Acids) .Marland Kitchen.. 1 By Mouth Once Daily 18)  Fluconazole 150 Mg Tabs (Fluconazole) .Marland Kitchen.. 1po Once Daily Per Dr Terri Piedra For Thrush/derm 19)  Zolpidem Tartrate 5 Mg Tabs (Zolpidem Tartrate) .Marland Kitchen.. 1po At Bedtime As Needed For Sleep  Allergies (verified): 1)  ! Pcn 2)  ! Codeine 3)  ! 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   Head:  normocephalic and atraumatic.   Eyes:  vision grossly intact, pupils equal, and pupils round.   Ears:  R ear normal and L ear normal.   Nose:  no external deformity and no nasal discharge.   Mouth:  no gingival abnormalities and pharynx pink and moist.   Neck:  supple and no masses.  and decreased ROM to horizontal turning head to right by approx 10 - 20 degrees, but nontender, no swelling, no rash Lungs:  normal respiratory effort and normal breath sounds.   Heart:  normal rate and regular rhythm.   Msk:  no joint tenderness and no joint swelling.   Extremities:  no edema, no erythema  Neurologic:  strength normal in all extremities, gait normal, and DTRs symmetrical and normal.     Impression & Recommendations:  Problem # 1:  FATIGUE (ICD-780.79) Assessment Deteriorated  exam benign, to check labs below; follow with expectant management   Orders: TLB-BMP (Basic Metabolic Panel-BMET) (80048-METABOL) TLB-CBC Platelet - w/Differential (85025-CBCD) TLB-Hepatic/Liver Function  Pnl (80076-HEPATIC) TLB-TSH (Thyroid Stimulating Hormone) (84443-TSH)  Problem # 2:  NECK PAIN, RIGHT (ICD-723.1) Assessment: Deteriorated  Her updated medication list for this problem includes:    Meloxicam 15 Mg Tabs (Meloxicam) .Marland Kitchen... Take 1 tablet by mouth once a day    Ecotrin Low Strength 81 Mg Tbec (Aspirin) .Marland Kitchen... 1 by mouth qd    Flexeril 5 Mg Tabs (Cyclobenzaprine hcl) .Marland Kitchen... 1po three times a day as needed pain    Hydrocodone-acetaminophen 5-325 Mg Tabs (Hydrocodone-acetaminophen) .Marland Kitchen... 1po q 6 hrs as needed nontender , suspect underlying spinal disc dz flare;  treat as above, f/u any worsening signs or symptoms   Problem # 3:  DIABETES MELLITUS, TYPE II (ICD-250.00)  Her updated medication list for this problem includes:    Benazepril Hcl 40 Mg Tabs (Benazepril hcl) .Marland Kitchen... 1po once daily    Ecotrin Low Strength 81 Mg Tbec (Aspirin) .Marland Kitchen... 1 by mouth qd  Labs Reviewed: Creat: 0.7 (12/02/2009)    Reviewed HgBA1c results: 6.1 (12/02/2009)  6.2 (12/12/2008) stable overall by hx and exam, ok to continue meds/tx as is , Pt to cont DM diet, excercise, wt control efforts; to check labs today   Orders: TLB-A1C / Hgb A1C (Glycohemoglobin) (83036-A1C) TLB-Microalbumin/Creat Ratio, Urine (82043-MALB) TLB-Lipid Panel (80061-LIPID)  Problem # 4:  HYPERTENSION (ICD-401.9)  Her updated medication list for this problem includes:    Atenolol 50 Mg Tabs (Atenolol) .Marland Kitchen... 1 by mouth once daily    Benazepril Hcl 40 Mg Tabs (Benazepril hcl) .Marland Kitchen... 1po once daily    Furosemide 40 Mg Tabs (Furosemide) .Marland Kitchen... 1 tablet by mouth once a day  BP today: 134/82 Prior BP: 172/100 (12/02/2009)  Labs Reviewed: K+: 4.7 (12/02/2009) Creat: : 0.7 (12/02/2009)   Chol: 156 (12/02/2009)   HDL: 40.20 (12/02/2009)   LDL: 55 (12/12/2008)   TG: 310.0 (12/02/2009) stable overall by hx and exam, ok to continue meds/tx as is   Problem # 5:  HYPERLIPIDEMIA (ICD-272.4)  Her updated medication list for this problem  includes:    Simvastatin 40 Mg Tabs (Simvastatin) .Marland Kitchen... Take 1 tablet by mouth once a day  Labs Reviewed: SGOT: 29 (12/12/2008)   SGPT: 15 (12/12/2008)   HDL:40.20 (12/02/2009), 37.4 (12/12/2008)  LDL:55 (  12/12/2008), DEL (03/02/2007)  Chol:156 (12/02/2009), 131 (12/12/2008)  Trig:310.0 (12/02/2009), 191 (12/12/2008) stable overall by hx and exam, ok to continue meds/tx as is, Pt to continue diet efforts, good med tolerance; to check labs - goal LDL less than 70   Complete Medication List: 1)  Atenolol 50 Mg Tabs (Atenolol) .Marland Kitchen.. 1 by mouth once daily 2)  Benazepril Hcl 40 Mg Tabs (Benazepril hcl) .Marland Kitchen.. 1po once daily 3)  Effexor Xr 150 Mg Cp24 (Venlafaxine hcl) .Marland Kitchen.. 1 by mouth two times a day 4)  Fosamax 70 Mg Tabs (Alendronate sodium) .Marland Kitchen.. 1 tablet by mouth once a week 5)  Furosemide 40 Mg Tabs (Furosemide) .Marland Kitchen.. 1 tablet by mouth once a day 6)  Meloxicam 15 Mg Tabs (Meloxicam) .... Take 1 tablet by mouth once a day 7)  Omeprazole 20 Mg Cpdr (Omeprazole) .... Take 2 capsule by mouth once a day 8)  Simvastatin 40 Mg Tabs (Simvastatin) .... Take 1 tablet by mouth once a day 9)  Cetirizine Hcl 10 Mg Tabs (Cetirizine hcl) .Marland Kitchen.. 1 by mouth once daily prn 10)  Ecotrin Low Strength 81 Mg Tbec (Aspirin) .Marland Kitchen.. 1 by mouth qd 11)  Flexeril 5 Mg Tabs (Cyclobenzaprine hcl) .Marland Kitchen.. 1po three times a day as needed pain 12)  Hydrocodone-acetaminophen 5-325 Mg Tabs (Hydrocodone-acetaminophen) .Marland Kitchen.. 1po q 6 hrs as needed 13)  Prednisone 10 Mg Tabs (Prednisone) .... 3po qd for 3days, then 2po qd for 3days, then 1po qd for 3days, then stop 14)  Vitamin D3 1000 Unit Caps (Cholecalciferol) .Marland Kitchen.. 1 by mouth once daily 15)  Multivitamins Tabs (Multiple vitamin) .Marland Kitchen.. 1 by mouth once daily 16)  Calcium 600 Mg Tabs (Calcium) .Marland Kitchen.. 1 by mouth two times a day 17)  Fish Oil 1000 Mg Caps (Omega-3 fatty acids) .Marland Kitchen.. 1 by mouth once daily 18)  Fluconazole 150 Mg Tabs (Fluconazole) .Marland Kitchen.. 1po once daily per dr Terri Piedra for  thrush/derm 19)  Zolpidem Tartrate 5 Mg Tabs (Zolpidem tartrate) .Marland Kitchen.. 1po at bedtime as needed for sleep  Other Orders: EKG w/ Interpretation (93000)  Patient Instructions: 1)  Please take all new medications as prescribed - fhe pain medicines for the neck, and the sleeping medicine 2)  Continue all previous medications as before this visit  3)  Please go to the Lab in the basement for your blood and/or urine tests today 4)  Please call the number on the [facility] Card for results of your testing 5)  Please schedule a follow-up appointment in 6 months Prescriptions: CETIRIZINE HCL 10 MG  TABS (CETIRIZINE HCL) 1 by mouth once daily prn  #90 x 3   Entered and Authorized by:   Corwin Levins MD   Signed by:   Corwin Levins MD on 11/18/2010   Method used:   Faxed to ...       [facility] (mail-order)             , [place]         Ph: [phone]       Fax: [phone]   RxID:   [id number] SIMVASTATIN 40 MG TABS (SIMVASTATIN) Take 1 tablet by mouth once a day  #90 x 3   Entered and Authorized by:   Corwin Levins MD   Signed by:   Corwin Levins MD on 11/18/2010   Method used:   Faxed to ...       [facility] Environmental education officer)             ,  [place]         Ph: [phone]       Fax: [phone]   RxID:   [id number] OMEPRAZOLE 20 MG CPDR (OMEPRAZOLE) Take 2 capsule by mouth once a day  #180 x 3   Entered and Authorized by:   Corwin Levins MD   Signed by:   Corwin Levins MD on 11/18/2010   Method used:   Faxed to ...       [facility] (mail-order)             , [place]         Ph: [phone]       Fax: [phone]   RxID:   [id number] MELOXICAM 15 MG TABS (MELOXICAM) Take 1 tablet by mouth once a day  #90 x 3   Entered and Authorized by:   Corwin Levins MD   Signed by:   Corwin Levins MD on 11/18/2010   Method used:   Faxed to ...       [facility] (mail-order)             , [place]         Ph: [phone]       Fax: [phone]   RxID:    [id number] FUROSEMIDE 40 MG TABS (FUROSEMIDE) 1 tablet by mouth once a day  #90 x 3   Entered and Authorized by:   Corwin Levins MD   Signed by:   Corwin Levins MD on 11/18/2010   Method used:   Faxed to ...       [facility] (mail-order)             , [place]         Ph: [phone]       Fax: [phone]   RxID:   [id number] FOSAMAX 70 MG TABS (ALENDRONATE SODIUM) 1 tablet by mouth once a week  #12 x 3   Entered and Authorized by:   Corwin Levins MD   Signed by:   Corwin Levins MD on 11/18/2010   Method used:   Faxed to ...       [facility] (mail-order)             , [place]         Ph: [phone]       Fax: [phone]   RxID:   [id number] EFFEXOR XR 150 MG CP24 (VENLAFAXINE HCL) 1 by mouth two times a day  #180 x 3   Entered and Authorized by:   Corwin Levins MD   Signed by:   Corwin Levins MD on 11/18/2010   Method used:   Faxed to ...       [facility] (mail-order)             , [place]         Ph: [phone]       Fax: [phone]   RxID:   [id number] BENAZEPRIL HCL 40 MG TABS (BENAZEPRIL HCL) 1po once daily  #90 x 3   Entered and Authorized by:   Corwin Levins MD   Signed by:   Corwin Levins MD on 11/18/2010   Method used:   Faxed to ...       [facility] (mail-order)             , [place]         Ph: [phone]  Fax: [phone]   RxID:   [id number] ATENOLOL 50 MG TABS (ATENOLOL) 1 by mouth once daily  #90 x 3   Entered and Authorized by:   Corwin Levins MD   Signed by:   Corwin Levins MD on 11/18/2010   Method used:   Faxed to ...       [facility] (mail-order)             , [place]         Ph: [phone]       Fax: [phone]   RxID:   [id number] PREDNISONE 10 MG TABS (PREDNISONE) 3po qd for 3days, then 2po qd for 3days, then 1po qd for 3days, then stop  #18 x 0   Entered and Authorized by:   Corwin Levins MD   Signed by:   Corwin Levins MD on 11/18/2010   Method used:   Print then Give to Patient    RxID:   [id number] HYDROCODONE-ACETAMINOPHEN 5-325 MG TABS (HYDROCODONE-ACETAMINOPHEN) 1po q 6 hrs as needed  #60 x 1   Entered and Authorized by:   Corwin Levins MD   Signed by:   Corwin Levins MD on 11/18/2010   Method used:   Print then Give to Patient   RxID:   [id number] FLEXERIL 5 MG TABS (CYCLOBENZAPRINE HCL) 1po three times a day as needed pain  #90 x 1   Entered and Authorized by:   Corwin Levins MD   Signed by:   Corwin Levins MD on 11/18/2010   Method used:   Print then Give to Patient   RxID:   [id number] ZOLPIDEM TARTRATE 5 MG TABS (ZOLPIDEM TARTRATE) 1po at bedtime as needed for sleep  #30 x 5   Entered and Authorized by:   Corwin Levins MD   Signed by:   Corwin Levins MD on 11/18/2010   Method used:   Print then Give to Patient   RxID:   [id number]    Orders Added: 1)  EKG w/ Interpretation [93000] 2)  TLB-BMP (Basic Metabolic Panel-BMET) [80048-METABOL] 3)  TLB-CBC Platelet - w/Differential [85025-CBCD] 4)  TLB-Hepatic/Liver Function Pnl [80076-HEPATIC] 5)  TLB-TSH (Thyroid Stimulating Hormone) [84443-TSH] 6)  TLB-A1C / Hgb A1C (Glycohemoglobin) [83036-A1C] 7)  TLB-Microalbumin/Creat Ratio, Urine [82043-MALB] 8)  TLB-Lipid Panel [80061-LIPID] 9)  Est. Patient Level IV [69485]

## 2011-04-16 NOTE — H&P (Signed)
NAME:  Tara Neal, Tara Neal                              ACCOUNT NO.:  [id number]   MEDICAL RECORD NO.:  [id number]                   PATIENT TYPE:   LOCATION:                                       FACILITY:   PHYSICIAN:  [id number]                                DATE OF BIRTH:   DATE OF ADMISSION:  07/25/2002  DATE OF DISCHARGE:                                HISTORY & PHYSICAL   CHIEF COMPLAINT:  Left lower extremity radicular pain and numbness.   HISTORY OF PRESENT ILLNESS:  The patient is a 70 year old female who comes  to clinic for the left lower extremity radicular pain and numbness.  She has  had radicular pain for the last two to three months.  She denies any  specific antecedent injury.  Pain is worse with activity and better with  rest.  She has numbness on the outer aspect of the left foot.  The pain is  particularly disabling at this time, worse when she sits and better when she  stands.  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  Dr. Shelle Iron has discussed the  risks and benefits with the patient and the patient wished to proceed with  procedure.   PAST MEDICAL HISTORY:  1. Hypertension.  2. Gastroesophageal reflux disease.  3. Osteoporosis.   PAST SURGICAL HISTORY:  Tonsils, removal of a growth in her throat,  bilateral cataracts.   MEDICATIONS:  1. Atenolol 50 mg one p.o. q.d.  2. Protonix 40 mg one p.o. q.d.  3. HCTZ 25 mg one p.o. q.d.  4. Actonel 37 mg one p.o. q.week.  5. Darvocet-N 100 one p.o. t.i.d.  6. Vicodin one to two p.o. q.4-6h. p.r.n.   ALLERGIES:  PENICILLIN causes a rash, CODEINE causes nausea, SULFA causes  nausea.   SOCIAL HISTORY:  The patient is married.  She denies any alcohol, tobacco  use.  She lives in a [place] house with about a four inch step up into the  house.  Husband will be her caregiver after surgery.   FAMILY HISTORY:  Mother deceased coronary artery disease,  hypertension,  diabetes, and stroke.  Father died when she was 12 years old so medical  history not known.   REVIEW OF SYMPTOMS:  GENERAL:  Denies fevers, chills, night sweats, bleeding  tendencies.  CNS:  Positive headache.  Denies blurry/double vision,  seizures, paralysis.  RESPIRATORY:  Denies shortness of breath, productive  cough, hemoptysis.  CARDIOVASCULAR:  Denies chest pain, angina, orthopnea.  GASTROINTESTINAL:  Denies nausea, vomiting, diarrhea, constipation, melena,  bloody stools.  GENITOURINARY:  Denies dysuria, hematuria, discharge.  MUSCULOSKELETAL:  Pertinent to HPI.   PHYSICAL EXAMINATION:  VITAL SIGNS:  Blood pressure 140/80, pulse 88,  respirations 12.  GENERAL:  Well-developed, well-nourished 70 year old female.  NECK:  Supple.  No carotid bruit.  CHEST:  Clear to auscultation bilaterally.  No wheezes or crackles.  HEART:  Regular rate and rhythm.  No murmurs, rubs, or gallops.  ABDOMEN:  Soft, nontender, nondistended.  Positive bowel sounds x4.  EXTREMITIES:  The patient walks with an antalgic gait.  She is tender in the  gluteus on the left.  Straight leg raise produces marked buttock, posterior  thigh, and calf pain on the left, negative on the right.  She has decreased  sensation in S1 dermatome, slightly diminished repetitive plantar flexion  and Achilles reflex on left.  Hips, knees, and ankles all have pain with  range of motion and periphery.  Pulses are intact.  Motor is 5/5.  SKIN:  No rashes or lesions.  NEUROLOGIC:  Alert and oriented x3.   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   IMPRESSION:  1. 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  2. Hypertension.  3. Gastroesophageal reflux disease.  4. Osteoporosis.   PLAN OF ACTION:  The patient will be admitted to [facility] on  July 25, 2002 to undergo microdiskectomy at L5-S1 and excision of synovial  cyst by Dr. Jene Every.     Clarene Reamer, [place]                      [id number]    SW/MEDQ  D:  07/16/2002  T:  07/16/2002  Job:  [id number]

## 2011-04-16 NOTE — Op Note (Signed)
TNAMEAVAEH, EWER                           ACCOUNT NO.:  [id number]   MEDICAL RECORD NO.:  [id number]                   PATIENT TYPE:  AMB   LOCATION:  DAY                                  FACILITY:  [facility]   PHYSICIAN:  Javier Docker, M.D.              DATE OF BIRTH:  [birth year]   DATE OF PROCEDURE:  07/25/2002  DATE OF DISCHARGE:                                 OPERATIVE REPORT   PREOPERATIVE DIAGNOSES:  Spinal stenosis, lateral recess stenosis, synovial  cyst L5-S1 left.   POSTOPERATIVE DIAGNOSES:  Spinal stenosis, lateral recess stenosis, synovial  cyst L5-S1 left.   PROCEDURE:  Hemilaminectomy L5-S1, lateral recess decompression with S1  foraminotomy, excision of synovial cyst.   BRIEF HISTORY:  A 70 year old with S1 radiculopathy, MRI indicating synovial  cyst compressing the S1 nerve root and lateral recess stenosis. Operative  intervention was indicated for decompression of the S1 nerve root by lateral  recessed decompression, excision of the synovial cyst. The risks and  benefits were discussed including bleeding, infection, injury to  neurovascular structures, no change in symptoms, recurrence of the cyst,  return of pathology, etc.   TECHNIQUE:  The patient in the supine position after an adequate level of  anesthesia and 1 gm of Kefzol, she was placed on the [place] frame. All bony  prominences were well padded. The lumbar region was prepped and draped in  the usual sterile fashion. Two 18 gauge spinal needles were utilized to  localize the L5-S1 interspace, confirmed with x-ray. An incision was made  through the spinous process of L5-S1, subcutaneous tissue was dissected,  electrocautery utilized to achieve hemostasis. The dorsolumbar fascia  identified and divided in line with the skin incision. The paraspinous  muscles were elevated from the lamina of L5 and S1. A McCullough retractor  was placed. The operating microscope draped and brought in the  surgical  field after Penfield 4 was placed confirming the interlaminar space. I  performed a hemilaminotomy with a 2 and a 3 mm Kerrison over the caudad edge  of L5-S1 piecemeal. I detached the ligamentum flavum from the cephalad edge  of S1 with straight curette and then performed a foraminotomy of S1  enlarging the laminotomy. I identified the S1 nerve root in the foramen  distally, tracked it proximally to the pedicle. There was a large  inflammatory mass compressing against the S1 nerve root that appeared to be  emanating from the L5-S1 joint. I meticulously dissected the mass from the  thecal sac and the S1 nerve root and it was not confluent with that but  marginally adherent. After I fully dissected and mobilized the thecal sac  and the S1 nerve root from this, we piecemeal removed the mass measuring a  centimeter by a centimeter. It appeared to be a synovial cyst although  somewhat atypical, we therefore sent it to pathology for frozen section.  This was then fully excised. Bipolar electrocautery was utilized to achieve  hemostasis, there was an epidural venous plexus noted. I examined the disk  at L5-S1 and there was no evidence of herniation. The foramen of L5 was  widely patent. The S1 foramen was widely patent, the S1 nerve root was  freely mobile following the excision with a 1 cm medial excursion. The wound  was copiously irrigated, bipolar electrocautery utilized to achieve  hemostasis. Inspection revealed no evidence of CSF leakage or active  bleeding. The S1 nerve root was found to be erythematous and edematous. I  placed thrombin soaked Gelfoam in the laminotomy defect, removed the  McCullough retractor. The paraspinous muscle was inspected with no evidence  of active bleeding. The dorsolumbar fascia was reapproximated with #1 Vicryl  interrupted figure-of-eight sutures. The subcutaneous tissue was  reapproximated with 2-0 Vicryl simple sutures. The skin was  reapproximated  with 4-0 subcuticular Prolene, reinforced with Steri-Strips. A sterile  dressing was applied. She was placed supine on the hospital bed, extubated  without difficulty and transported to the recovery room in satisfactory  condition.   The patient tolerated the procedure well with no complications.                                               Javier Docker, M.D.    JCB/MEDQ  D:  07/25/2002  T:  07/26/2002  Job:  [id number]

## 2011-04-16 NOTE — Op Note (Signed)
Tara Neal, Tara Neal                            ACCOUNT NO.:  [id number]   MEDICAL RECORD NO.:  [id number]                   PATIENT TYPE:  INP   LOCATION:  2899                                 FACILITY:  [facility]   PHYSICIAN:  Jene Every, M.D.                 DATE OF BIRTH:  1941/02/23   DATE OF PROCEDURE:  12/30/2003  DATE OF DISCHARGE:                                 OPERATIVE REPORT   PREOPERATIVE DIAGNOSIS:  Spinal stenosis, recurrent L5-S1, left.   POSTOPERATIVE DIAGNOSIS:  Spinal stenosis, recurrent L5-S1, left.   PROCEDURE:  1. Redo compression L5-S1 with foraminotomies of L5 and S1, and     decompression of the L5 and the S1 nerve roots.  2. Partial medial hemi-fasciectomies.  3. Removal of synovial cyst.   ANESTHESIA:  General.   SURGEON:  Jene Every, M.D.   ASSISTANT:  Roma Schanz, P.A.   INDICATIONS FOR PROCEDURE:  This 70 year old with degenerative scoliosis and  facet arthropathy and neural foraminal stenosis.  Operative intervention was  indicated for a decompression at L5 and S1 nerve roots.  She had a previous  synovial cyst and sinus.  She had subluxation of the facet at L5-S1.  We  discussed a decompression of L5 and S1 on the left.  The risks and limits  have been discussed, including bleeding, infection, hemorrhage, no change in  the symptoms, worsening of symptoms, need for a full fusion of the  degenerative scoliosis in the future.  We are trying to avoid that.   DESCRIPTION OF PROCEDURE:  With the patient in the supine position after the  induction of general anesthesia and 500 mg of vancomycin.  She was placed  prone on the [place] frame and all bony prominences were well-padded.  The  lumbar area was prepped and draped in the usual sterile fashion.  The  previous surgical incision was utilized.  We extended the incision cephalad  and caudad.  The subcutaneous tissue was checked by __________  to achieve  hemostasis.  We divided the fascia in  line with the skin incision.  The  paraspinous muscles were elevated from the lamina of 5 and partial of 4 and  of S-1.  Partially elevated the paraspinous musculature on the right.  A  Kocher was placed and two radiographs were obtained, demonstrating the 4 and  the 5 spinous processes.  These were then removed.  She had an increased  lumbosacral angle.  The McCullough retractor was placed.  The operating  microscope was draped and brought onto the surgical field.  First a curet  was utilized to skeletonize the lamina of L5-S1.  The facet was  hypertrophied.  It was first subluxed.  I performed a hemilaminotomy of the  caudad edge of L5 with a 2 mm and a 3 mm Kerrison, entering the joint of L5-  S1.  There was a large synovial  cyst extending into the lateral recess at L5-  S1.  I undercut the facet and the superior articular process of S1 with a 2  mm Kerrison.  It was found to project into the L5 foramen.  L5 and S1  stenosis with synovial cyst as well compressing the lateral recess.  We  removed the cephalad edge of the superior articular facet of S1, utilizing a  2 mm Kerrison.  We removed the hypertrophic ligament of flavum as well as a  synovial cyst.  Bipolar cautery was utilized to achieve hemostasis over the  venous plexus around the L5 root.  __________  were placed out in the L5  foramen and found to be widely patent following the foraminotomy.  We  decompressed the lateral recess of L5 and S1.  The disk as expected, no  evidence of herniation.  A foraminotomy was performed at S1, decompressing  the S1 nerve root as well.  After the decompression, the S1 and L5 nerve  roots were fully mobile with no residual compression.  Bipolar current was  utilized to achieve strict hemostasis.  Again re-inspection revealed old  decompression of the L5 root.  We could see the pedicle of L5 and of S1.  There was no residual S tissue.  Next, we copiously irrigated with  antibiotic irrigation.   Thrombin-soaked Gelfoam was placed in the laminar  defect.  Electrocautery was utilized to achieve strict hemostasis.  We  removed the [facility] retractor.  The paraspinous muscle was __________  .  Electrocautery was utilized to achieve hemostasis.  Placed a Hemovac drain  and brought out through a lateral stab wound in the skin.  Repaired the  dorsal lumbar fascia with #1 Vicryl in figure-of-eight sutures.  The  subcutaneous tissue reapproximated with #2-0 Vicryl .  The skin was  reapproximated with staples.  The wound was dressed sterilely.  She was  placed supine on the hospital bed and extubated without difficulty, and  transported to the recovery room in satisfactory condition.   There were no complications.  The estimated blood loss was approximately 50  mL.                                               Jene Every, M.D.    Cordelia Pen  D:  12/30/2003  T:  12/30/2003  Job:  [id number]

## 2011-04-16 NOTE — Op Note (Signed)
Tara Neal, Tara Neal                            ACCOUNT NO.:  [id number]   MEDICAL RECORD NO.:  [id number]                   PATIENT TYPE:  OIB   LOCATION:  5727                                 FACILITY:  [facility]   PHYSICIAN:  Alford Highland. Rankin, M.D.                DATE OF BIRTH:  December 18, 1940   DATE OF PROCEDURE:  03/18/2003  DATE OF DISCHARGE:  03/18/2003                                 OPERATIVE REPORT   PREOPERATIVE DIAGNOSES:  1. Epiretinal membrane, left eye, with macular topographic distortion.  2. Stage 1 macular hole, left eye, secondary to #1.   POSTOPERATIVE DIAGNOSES:  1. Epiretinal membrane, left eye, with macular topographic distortion.  2. Stage 1 macular hole, left eye, secondary to #1.   PROCEDURE:  Posterior vitrectomy with membrane peel, left eye.   SURGEON:  Alford Highland. Rankin, M.D.   ANESTHESIA:  General endotracheal anesthesia.   INDICATION FOR PROCEDURE:  The patient is a 70 year old woman with  significant impairment of activities of daily living on the basis of visual  acuity impairment from epiretinal membrane with topographic distortion of  the left eye affecting reading ability.  This is an attempt to surgically  remove and release the traction and the macular hole.  The patient  understands the risks of anesthesia, including the rare occurrence of death,  and loss to the eye including but not limited to hemorrhage, infection,  scarring, need for additional surgery, no change in vision, loss of vision,  and progressive disease despite intervention.  An appropriate signed consent  was obtained.   DESCRIPTION OF PROCEDURE:  She was taken to the operating room. In the  operating room general endotracheal anesthesia administered without  difficulty.  The left periocular region was sterilely prepped and draped in  the usual ophthalmic fashion.  A lid speculum applied.  Conjunctival  peritomy fashioned temporally and superonasally.  A 4 mm infusion was  secured  3.5 mm posterior to the limbus in the inferotemporal quadrant.  Placement in the vitreous cavity verified visually.  Superior sclerotomies  were then fashioned.  The wall microscope placed in position and BIOM  attached.  Core vitrectomy was then begun.  Iatrogenic posterior vitreous  detachment was then created.  Vitreous skirt elevated 360 degrees.  DORC  forceps were then used to engage the internal limiting membrane.  There was  a thin layer of cortical vitreous and this was removed, followed by  epiretinal membrane and the internal limiting membrane, which was removed as  a single sheet overlying the foveal region.  No complications occurred.  The  hole did not open.  There was no loss of the roof.  At this time it was  deemed not necessary to perform fluid-air exchange.  The peripheral retina  was inspected and found to be free of retinal holes or tears.  The superior  sclerotomy was then closed with  7-0 Vicryl suture.  The infusion removed and  similarly closed with 7-0 Vicryl suture.  Conjunctiva closed with 7-0 Vicryl  suture.  Subconjunctival injections of steroid applied.  Intravenous Cipro  given for prophylaxis.  The patient tolerated the procedure well without  complications.                                               Alford Highland Rankin, M.D.   GAR/MEDQ  D:  03/18/2003  T:  03/19/2003  Job:  [id number]

## 2011-04-16 NOTE — H&P (Signed)
NAMEARRIE, BORRELLI                            ACCOUNT NO.:  [id number]   MEDICAL RECORD NO.:  [id number]                   PATIENT TYPE:  INP   LOCATION:  5003                                 FACILITY:  [facility]   PHYSICIAN:  Jene Every, M.D.                 DATE OF BIRTH:  [birth year]   DATE OF ADMISSION:  12/30/2003  DATE OF DISCHARGE:                                HISTORY & PHYSICAL   CHIEF COMPLAINT:  Left lower extremity pain.   HISTORY:  Ms. Urquidi is a 70 year old female who was treated in 2003 for a  left lower extremity pain.  In August 2003, she underwent a lateral recess  decompression and excision of synovial cyst at the L5-S1 level.  The patient  did quite well for some time but noticed a slow reoccurrence of her  symptoms.  The patient underwent conservative treatment including selective  nerve root blocks, which did help for some time, however, she did note  worsening of her symptoms to the point where it was disabling.  Different  treatment options were discussed with the patient including continued  analgesics as well as continued selective nerve root blocks versus surgical  intervention.  The risks and benefits of this surgery were discussed with  the patient and she wishes to proceed with a foraminotomy at the L5-S1  level.   MEDICAL HISTORY:  Medical history is significant for:  1. Hypertension.  2. Gastroesophageal reflux disease.  3. Hyperlipidemia.   CURRENT MEDICATIONS:  1. Atenolol 50 mg 1 p.o. daily.  2. Actonel 35 mg 1 p.o. q.wk.  3. Protonix 40 mg 1 p.o. daily.  4. Lasix 40 mg 1 p.o. daily.  5. Effexor XR 150 mg 1 p.o. daily.  6. Mobic 7.5 mg 1 p.o. daily.  7. Hydrocodone 5/500 mg p.r.n.  8. Zanaflex 4 mg p.r.n.  9. Levaquin 500 mg daily for recent cold.  10.      Bromfenex 1 or 2 b.i.d.   ALLERGIES:  Allergies include CODEINE, SULFA and PENICILLIN which cause  nausea and vomiting as well as a rash.   PREVIOUS SURGERIES:  1. Tonsillectomy.  2. Cataracts bilaterally.  3. Lung collapse many years ago.  4. Retinal repair in 2004.   SOCIAL HISTORY:  The patient is married.  She is a housewife.  She does not  drink any alcohol.  She did smoke for approximately 20 years but has also  stopped for the past 20 years.  Husband will be her care-giver following  surgery.   FAMILY HISTORY:  Mother significant for coronary artery disease,  hypertension, diabetes, CVA as well as osteoarthritis.   REVIEW OF SYSTEMS:  GENERAL:  The patient denies any fevers, chills, night  sweats or bleeding tendencies.  CNS:  No blurred or double vision, seizure,  headache or paralysis.  RESPIRATORY:  No shortness of breath, productive  cough or hemoptysis.  CARDIAC:  No chest pain, angina or orthopnea.  GU:  No  dysuria, hematuria or discharge.  GI:  No nausea, vomiting, diarrhea,  constipation, melena or bloody stools.  MUSCULOSKELETAL:  Pertinent as in  HPI.   PHYSICAL EXAMINATION:  VITAL SIGNS:  On physical exam, pulse is 76,  respirations 16, BP 140/90.  GENERAL:  This is a well-developed, well-nourished 70 year old female in  mild distress.  HEENT:  Atraumatic, normocephalic.  Pupils equal, round and reactive to  light.  EOMs intact.  NECK:  Neck supple with no lymphadenopathy.  CHEST:  Chest clear to auscultation bilaterally.  No rhonchi, wheezes or  rales.  BREASTS AND GU:  Not examined, not pertinent to HPI.  HEART:  Regular rate and rhythm without murmurs, gallops or rubs.  ABDOMEN:  Abdomen is soft, nontender and nondistended.  Bowel sounds x4.  SKIN:  No rashes or lesions are noted.  EXTREMITIES:  The patient does have positive straight leg raise on the left  that produces buttocks tissue,  thigh and calf pain.  EHL is __________  in  comparison to the contralateral side.  Sensation is intact to light touch.   IMPRESSION:  Left lower extremity radicular pain at the L5 nerve root  distribution secondary to spinal stenosis.   PLAN:   The patient will undergo a lateral recess decompression with  foraminotomy at L5 on the left with partial facetectomy.  The patient did  receive medical clearance from [facility].      Roma Schanz, P.A.                   Jene Every, M.D.    CS/MEDQ  D:  12/31/2003  T:  12/31/2003  Job:  [id number]

## 2011-05-17 ENCOUNTER — Other Ambulatory Visit: Payer: Self-pay | Admitting: Internal Medicine

## 2011-05-17 NOTE — Telephone Encounter (Signed)
Faxed hardcopy to pharmacy. 

## 2011-06-07 ENCOUNTER — Other Ambulatory Visit: Payer: Self-pay | Admitting: Internal Medicine

## 2011-06-07 NOTE — Telephone Encounter (Signed)
Faxed hardcopy to pharmacy. 

## 2011-09-01 ENCOUNTER — Other Ambulatory Visit: Payer: Self-pay | Admitting: Internal Medicine

## 2011-09-01 DIAGNOSIS — Z1231 Encounter for screening mammogram for malignant neoplasm of breast: Secondary | ICD-10-CM

## 2011-10-13 ENCOUNTER — Ambulatory Visit
Admission: RE | Admit: 2011-10-13 | Discharge: 2011-10-13 | Disposition: A | Discharge status: Home / Self Care | Payer: Medicare Other | Source: Ambulatory Visit | Attending: Internal Medicine | Admitting: Internal Medicine

## 2011-10-13 DIAGNOSIS — Z1231 Encounter for screening mammogram for malignant neoplasm of breast: Secondary | ICD-10-CM

## 2011-12-03 ENCOUNTER — Other Ambulatory Visit: Payer: Self-pay

## 2011-12-03 MED ORDER — ATENOLOL 50 MG PO TABS: 50.0000 mg | ORAL_TABLET | Freq: Every day | ORAL | Status: DC

## 2011-12-03 MED ORDER — OMEPRAZOLE 20 MG PO CPDR: 20.0000 mg | DELAYED_RELEASE_CAPSULE | Freq: Every day | ORAL | Status: DC

## 2011-12-03 MED ORDER — FUROSEMIDE 40 MG PO TABS: 40.0000 mg | ORAL_TABLET | Freq: Every day | ORAL | Status: DC

## 2011-12-03 MED ORDER — SIMVASTATIN 40 MG PO TABS: 40.0000 mg | ORAL_TABLET | Freq: Every day | ORAL | Status: DC

## 2011-12-11 ENCOUNTER — Encounter: Payer: Self-pay | Admitting: Internal Medicine

## 2011-12-11 DIAGNOSIS — Z0000 Encounter for general adult medical examination without abnormal findings: Secondary | ICD-10-CM | POA: Insufficient documentation

## 2011-12-13 ENCOUNTER — Encounter: Payer: Self-pay | Admitting: Internal Medicine

## 2011-12-13 ENCOUNTER — Other Ambulatory Visit (INDEPENDENT_AMBULATORY_CARE_PROVIDER_SITE_OTHER): Payer: Medicare Other

## 2011-12-13 ENCOUNTER — Ambulatory Visit (INDEPENDENT_AMBULATORY_CARE_PROVIDER_SITE_OTHER): Payer: Medicare Other | Admitting: Internal Medicine

## 2011-12-13 ENCOUNTER — Other Ambulatory Visit: Payer: Self-pay | Admitting: Internal Medicine

## 2011-12-13 VITALS — BP 162/100 | HR 75 | Temp 97.9°F | Ht 64.0 in | Wt 150.1 lb

## 2011-12-13 DIAGNOSIS — R5381 Other malaise: Secondary | ICD-10-CM | POA: Diagnosis not present

## 2011-12-13 DIAGNOSIS — M25559 Pain in unspecified hip: Secondary | ICD-10-CM | POA: Diagnosis not present

## 2011-12-13 DIAGNOSIS — E119 Type 2 diabetes mellitus without complications: Secondary | ICD-10-CM

## 2011-12-13 DIAGNOSIS — E785 Hyperlipidemia, unspecified: Secondary | ICD-10-CM

## 2011-12-13 DIAGNOSIS — R5383 Other fatigue: Secondary | ICD-10-CM | POA: Diagnosis not present

## 2011-12-13 DIAGNOSIS — M25561 Pain in right knee: Secondary | ICD-10-CM

## 2011-12-13 DIAGNOSIS — M25551 Pain in right hip: Secondary | ICD-10-CM | POA: Insufficient documentation

## 2011-12-13 DIAGNOSIS — I10 Essential (primary) hypertension: Secondary | ICD-10-CM

## 2011-12-13 DIAGNOSIS — M25569 Pain in unspecified knee: Secondary | ICD-10-CM

## 2011-12-13 LAB — CBC WITH DIFFERENTIAL/PLATELET
Basophils Absolute: 0.1 10*3/uL (ref 0.0–0.1)
Eosinophils Relative: 1.6 % (ref 0.0–5.0)
HCT: 37.3 % (ref 36.0–46.0)
Hemoglobin: 12.7 g/dL (ref 12.0–15.0)
Lymphs Abs: 3.3 10*3/uL (ref 0.7–4.0)
MCV: 96.5 fl (ref 78.0–100.0)
Monocytes Absolute: 0.7 10*3/uL (ref 0.1–1.0)
Neutro Abs: 6.5 10*3/uL (ref 1.4–7.7)
Platelets: 328 10*3/uL (ref 150.0–400.0)
RDW: 12.9 % (ref 11.5–14.6)

## 2011-12-13 LAB — HEPATIC FUNCTION PANEL
AST: 23 U/L (ref 0–37)
Alkaline Phosphatase: 88 U/L (ref 39–117)
Total Bilirubin: 0.6 mg/dL (ref 0.3–1.2)

## 2011-12-13 LAB — URINALYSIS, ROUTINE W REFLEX MICROSCOPIC
Bilirubin Urine: NEGATIVE
Ketones, ur: NEGATIVE
Leukocytes, UA: NEGATIVE
Specific Gravity, Urine: 1.01 (ref 1.000–1.030)
Urine Glucose: NEGATIVE
pH: 6 (ref 5.0–8.0)

## 2011-12-13 LAB — BASIC METABOLIC PANEL
Calcium: 9.3 mg/dL (ref 8.4–10.5)
GFR: 73.26 mL/min (ref >60.00)
Potassium: 4.4 mEq/L (ref 3.5–5.1)
Sodium: 139 mEq/L (ref 135–145)

## 2011-12-13 LAB — LIPID PANEL
HDL: 44.9 mg/dL (ref >39.00)
Total CHOL/HDL Ratio: 4
VLDL: 56.4 mg/dL — ABNORMAL HIGH (ref 0.0–40.0)

## 2011-12-13 LAB — TSH: TSH: 0.96 u[IU]/mL (ref 0.35–5.50)

## 2011-12-13 MED ORDER — BENAZEPRIL HCL 40 MG PO TABS: 40.0000 mg | ORAL_TABLET | Freq: Every day | ORAL | Status: DC

## 2011-12-13 MED ORDER — FUROSEMIDE 40 MG PO TABS: 40.0000 mg | ORAL_TABLET | Freq: Every day | ORAL | Status: DC

## 2011-12-13 MED ORDER — HYDROCODONE-ACETAMINOPHEN 5-325 MG PO TABS: ORAL_TABLET | ORAL | Status: DC

## 2011-12-13 MED ORDER — ALENDRONATE SODIUM 70 MG PO TABS: 70.0000 mg | ORAL_TABLET | ORAL | Status: DC

## 2011-12-13 MED ORDER — SIMVASTATIN 40 MG PO TABS: 40.0000 mg | ORAL_TABLET | Freq: Every day | ORAL | Status: DC

## 2011-12-13 MED ORDER — MELOXICAM 15 MG PO TABS: 15.0000 mg | ORAL_TABLET | Freq: Every day | ORAL | Status: DC

## 2011-12-13 MED ORDER — ZOLPIDEM TARTRATE 5 MG PO TABS: 5.0000 mg | ORAL_TABLET | Freq: Every evening | ORAL | Status: DC | PRN

## 2011-12-13 MED ORDER — ATENOLOL 50 MG PO TABS: 50.0000 mg | ORAL_TABLET | Freq: Every day | ORAL | Status: DC

## 2011-12-13 MED ORDER — VENLAFAXINE HCL ER 150 MG PO CP24: 150.0000 mg | ORAL_CAPSULE | Freq: Two times a day (BID) | ORAL | Status: DC

## 2011-12-13 MED ORDER — OMEPRAZOLE 20 MG PO CPDR: 20.0000 mg | DELAYED_RELEASE_CAPSULE | Freq: Every day | ORAL | Status: DC

## 2011-12-13 NOTE — Assessment & Plan Note (Signed)
stable overall by hx and exam, most recent data reviewed with pt, and pt to continue medical treatment as before  Lab Results  Component Value Date   HGBA1C 6.0 11/18/2010

## 2011-12-13 NOTE — Assessment & Plan Note (Signed)
stable overall by hx and exam, most recent data reviewed with pt, and pt to continue medical treatment as before  Lab Results  Component Value Date   LDLCALC 55 12/12/2008

## 2011-12-13 NOTE — Patient Instructions (Addendum)
Continue all other medications as before Please go to LAB in the Basement for the blood and/or urine tests to be done today Please call the phone number [phone] (the PhoneTree System) for results of testing in 2-3 days;  When calling, simply dial the number, and when prompted enter the MRN number above (the Medical Record Number) and the # key, then the message should start. You will be contacted regarding the referral for: Dr Lequita Halt for the right hip All of the refills were sent to [facility], except for the hydrocodone pain medication Please return in 6 months, or sooner if needed

## 2011-12-13 NOTE — Assessment & Plan Note (Signed)
Etiology unclear, Exam otherwise benign, to check labs as documented, follow with expectant management  

## 2011-12-13 NOTE — Assessment & Plan Note (Signed)
C/ post traumatic swelling after twist and fall episode, o/w stable, Continue all other medications as before, should improve further over next 2-3 wks, consider ortho if pain persists or worsens

## 2011-12-13 NOTE — Assessment & Plan Note (Signed)
stable overall by hx and exam, most recent data reviewed with pt, and pt to continue medical treatment as before, follow closely as BP elev today likely situational

## 2011-12-13 NOTE — Progress Notes (Signed)
Subjective:    Patient ID: Tara Neal, female    DOB: 14-Sep-1941, 71 y.o.   MRN: [id number]  HPI  Here with several complaints; has ongoing 3-4 months moderate pain to right lateral hip area, worse to palpate and lie on right side, worse to walk, not better with anything, no radiation.  Also with a pain/soreness to the anteromed right knee with puffiness after an episode of off balance and fell backwards without other injury but believes she twisted the knee, pain overall mild, no radiation, worse to walk and palpate and improving, but concerned since it has lasted 2 wks.  Also mentions a pain to the right groin, mild, intermittent for several wks assoc with numbness to the anterior thigh, but wouldn't have mentioned it except for the other two pains on the same side. No back pain and no bowel or bladder change, fever, wt loss,  worsening LE pain/numbness/weakness, gait change or other falls. Pt denies chest pain, increased sob or doe, wheezing, orthopnea, PND, increased LE swelling, palpitations, dizziness or syncope.  Pt denies new neurological symptoms such as new headache, or facial or extremity weakness or numbness except for the above.   Pt denies polydipsia, polyuria, or low sugar symptoms such as weakness or confusion improved with po intake.  Pt states overall good compliance with meds, trying to follow lower cholesterol, diabetic diet, wt overall stable but little exercise however.     Does have sense of ongoing fatigue, but denies signficant hypersomnolence. Past Medical History  Diagnosis Date  . Hyperlipidemia   . Hypertension   . Mild obesity   . Migraine headache   . Spontaneous pneumothorax   . GERD (gastroesophageal reflux disease)   . Osteoarthritis   . Low back pain   . Osteoporosis   . Hormone replacement therapy (postmenopausal)   . Diabetes mellitus   . Anxiety   . Osteopenia   . COPD (chronic obstructive pulmonary disease)   . Diverticulosis of colon    Past Surgical  History  Procedure Date  . Tonsillectomy   . Lumbar disc surgery      x 2  . Eye surgery     cataract extraction    reports that she has quit smoking. She does not have any smokeless tobacco history on file. She reports that she does not drink alcohol or use illicit drugs. family history includes Diabetes in her mother; Heart disease in her mother; and Hyperlipidemia in her mother. Allergies  Allergen Reactions  . Codeine     REACTION: Nausea  . Penicillins     REACTION: Rash  . Sulfonamide Derivatives     REACTION: Nausea   Current Outpatient Prescriptions on File Prior to Visit  Medication Sig Dispense Refill  . calcium carbonate (OS-CAL) 600 MG TABS Take 600 mg by mouth 2 (two) times daily with a meal.      . cetirizine (ZYRTEC) 10 MG tablet Take 10 mg by mouth daily.      . Cholecalciferol (VITAMIN D3) 1000 UNITS CAPS Take 1 capsule by mouth daily.      . Multiple Vitamin (MULTIVITAMIN) tablet Take 1 tablet by mouth daily.      Marland Kitchen aspirin 81 MG tablet Take 160 mg by mouth daily.       Review of Systems Review of Systems  Constitutional: Negative for diaphoresis and unexpected weight change.  HENT: Negative for drooling and tinnitus.   Eyes: Negative for photophobia and visual disturbance.  Respiratory: Negative for choking  and stridor.   Gastrointestinal: Negative for vomiting and blood in stool.  Genitourinary: Negative for hematuria and decreased urine volume.  Musculoskeletal: Negative for gait problem.  Skin: Negative for color change and wound.  Neurological: Negative for tremors and numbness.  Psychiatric/Behavioral: Negative for decreased concentration. The patient is not hyperactive.       Objective:   Physical Exam BP 162/100  Pulse 75  Temp(Src) 97.9 F (36.6 C) (Oral)  Ht 5\' 4"  (1.626 m)  Wt 150 lb 2 oz (68.096 kg)  BMI 25.77 kg/m2  SpO2 97% Physical Exam  VS noted Constitutional: Pt appears well-developed and well-nourished.  HENT: Head:  Normocephalic.  Right Ear: External ear normal.  Left Ear: External ear normal.  Eyes: Conjunctivae and EOM are normal. Pupils are equal, round, and reactive to light.  Neck: Normal range of motion. Neck supple.  Cardiovascular: Normal rate and regular rhythm.   Pulmonary/Chest: Effort normal and breath sounds normal.  Abd:  Soft, NT, non-distended, + BS Neurological: Pt is alert. No cranial nerve deficit.  Skin: Skin is warm. No erythema.  Psychiatric: Pt behavior is normal. Thought content normal.  Right knee with mild tender/swelling medial joint line o/w FROM, NT Tender over right lateral ihp over greater trochanter Right hip with FROM    Assessment & Plan:

## 2011-12-13 NOTE — Assessment & Plan Note (Signed)
C/w bursitis, cont pain med, for ortho referral - dr Lequita Halt per pt request, also with ? Element of right hip joint DJD and/or meralgia, .follwoj

## 2011-12-14 LAB — MICROALBUMIN / CREATININE URINE RATIO: Microalb Creat Ratio: 1.2 mg/g (ref 0.0–30.0)

## 2011-12-14 LAB — LDL CHOLESTEROL, DIRECT: Direct LDL: 79.6 mg/dL

## 2011-12-20 DIAGNOSIS — H5315 Visual distortions of shape and size: Secondary | ICD-10-CM | POA: Diagnosis not present

## 2011-12-20 DIAGNOSIS — Z961 Presence of intraocular lens: Secondary | ICD-10-CM | POA: Diagnosis not present

## 2012-01-12 ENCOUNTER — Telehealth: Payer: Self-pay

## 2012-01-12 NOTE — Telephone Encounter (Signed)
Patient did check with insurance company on the shingles shot.  Please call in rx for it at [facility] and [address] as insurance will pay. Call back number when complete [phone]

## 2012-01-12 NOTE — Telephone Encounter (Signed)
Order done hardcopy on rx paper to [facility] to fax

## 2012-01-12 NOTE — Telephone Encounter (Signed)
Called left message to call back 

## 2012-01-12 NOTE — Telephone Encounter (Signed)
Faxed rx to [facility] and [address] Called informed the patient

## 2012-01-13 ENCOUNTER — Telehealth: Payer: Self-pay

## 2012-01-13 NOTE — Telephone Encounter (Signed)
Patient called to inform [facility] did not receive rx faxed 01/12/12  for shingles vaccine. The patient was informed to pickup rx at front desk to take to pharmacy.

## 2012-01-14 DIAGNOSIS — M25569 Pain in unspecified knee: Secondary | ICD-10-CM | POA: Diagnosis not present

## 2012-01-14 DIAGNOSIS — M1710 Unilateral primary osteoarthritis, unspecified knee: Secondary | ICD-10-CM | POA: Diagnosis not present

## 2012-01-14 DIAGNOSIS — M25559 Pain in unspecified hip: Secondary | ICD-10-CM | POA: Diagnosis not present

## 2012-02-24 DIAGNOSIS — M25559 Pain in unspecified hip: Secondary | ICD-10-CM | POA: Diagnosis not present

## 2012-03-01 DIAGNOSIS — M25559 Pain in unspecified hip: Secondary | ICD-10-CM | POA: Diagnosis not present

## 2012-03-07 DIAGNOSIS — M25559 Pain in unspecified hip: Secondary | ICD-10-CM | POA: Diagnosis not present

## 2012-03-09 ENCOUNTER — Other Ambulatory Visit: Payer: Self-pay

## 2012-03-09 DIAGNOSIS — M25559 Pain in unspecified hip: Secondary | ICD-10-CM | POA: Diagnosis not present

## 2012-03-09 MED ORDER — FUROSEMIDE 40 MG PO TABS: 40.0000 mg | ORAL_TABLET | Freq: Every day | ORAL | Status: DC

## 2012-03-09 MED ORDER — OMEPRAZOLE 20 MG PO CPDR: 20.0000 mg | DELAYED_RELEASE_CAPSULE | Freq: Every day | ORAL | Status: DC

## 2012-03-09 MED ORDER — VENLAFAXINE HCL ER 150 MG PO CP24: 150.0000 mg | ORAL_CAPSULE | Freq: Two times a day (BID) | ORAL | Status: DC

## 2012-03-09 MED ORDER — ATENOLOL 50 MG PO TABS: 50.0000 mg | ORAL_TABLET | Freq: Every day | ORAL | Status: DC

## 2012-03-09 MED ORDER — SIMVASTATIN 40 MG PO TABS: 40.0000 mg | ORAL_TABLET | Freq: Every day | ORAL | Status: DC

## 2012-03-14 DIAGNOSIS — M25559 Pain in unspecified hip: Secondary | ICD-10-CM | POA: Diagnosis not present

## 2012-03-16 DIAGNOSIS — M25559 Pain in unspecified hip: Secondary | ICD-10-CM | POA: Diagnosis not present

## 2012-03-21 DIAGNOSIS — M25559 Pain in unspecified hip: Secondary | ICD-10-CM | POA: Diagnosis not present

## 2012-03-23 DIAGNOSIS — M25569 Pain in unspecified knee: Secondary | ICD-10-CM | POA: Diagnosis not present

## 2012-03-28 DIAGNOSIS — M25569 Pain in unspecified knee: Secondary | ICD-10-CM | POA: Diagnosis not present

## 2012-04-03 DIAGNOSIS — M25569 Pain in unspecified knee: Secondary | ICD-10-CM | POA: Diagnosis not present

## 2012-04-11 DIAGNOSIS — M25559 Pain in unspecified hip: Secondary | ICD-10-CM | POA: Diagnosis not present

## 2012-04-13 ENCOUNTER — Other Ambulatory Visit: Payer: Self-pay | Admitting: Orthopedic Surgery

## 2012-04-13 ENCOUNTER — Encounter (HOSPITAL_COMMUNITY): Payer: Self-pay | Admitting: Pharmacy Technician

## 2012-04-13 NOTE — Progress Notes (Signed)
Preoperative surgical orders have been place into the [facility] hospital system for Tara Neal on 04/13/2012, 5:34 PM  by Patrica Duel for surgery on 04/26/2012.  Preop Hip orders including Experel Injecion, IV Tylenol, and IV Decadron as long as there are no contraindications to the above medications.

## 2012-04-18 ENCOUNTER — Encounter (HOSPITAL_COMMUNITY): Payer: Self-pay

## 2012-04-18 ENCOUNTER — Ambulatory Visit (HOSPITAL_COMMUNITY)
Admission: RE | Admit: 2012-04-18 | Discharge: 2012-04-18 | Disposition: A | Discharge status: Home / Self Care | Payer: Medicare Other | Source: Ambulatory Visit | Attending: Orthopedic Surgery | Admitting: Orthopedic Surgery

## 2012-04-18 ENCOUNTER — Encounter (HOSPITAL_COMMUNITY)
Admission: RE | Admit: 2012-04-18 | Discharge: 2012-04-18 | Disposition: A | Discharge status: Home / Self Care | Payer: Medicare Other | Source: Ambulatory Visit | Attending: Orthopedic Surgery | Admitting: Orthopedic Surgery

## 2012-04-18 DIAGNOSIS — Z01812 Encounter for preprocedural laboratory examination: Secondary | ICD-10-CM | POA: Diagnosis not present

## 2012-04-18 DIAGNOSIS — I7090 Unspecified atherosclerosis: Secondary | ICD-10-CM | POA: Diagnosis not present

## 2012-04-18 DIAGNOSIS — Z01818 Encounter for other preprocedural examination: Secondary | ICD-10-CM | POA: Diagnosis not present

## 2012-04-18 DIAGNOSIS — Z01811 Encounter for preprocedural respiratory examination: Secondary | ICD-10-CM | POA: Diagnosis not present

## 2012-04-18 DIAGNOSIS — J449 Chronic obstructive pulmonary disease, unspecified: Secondary | ICD-10-CM | POA: Diagnosis not present

## 2012-04-18 HISTORY — DX: Anemia, unspecified: D64.9

## 2012-04-18 HISTORY — DX: Shortness of breath: R06.02

## 2012-04-18 HISTORY — DX: Pneumonia, unspecified organism: J18.9

## 2012-04-18 LAB — BASIC METABOLIC PANEL
Calcium: 10 mg/dL (ref 8.4–10.5)
Creatinine, Ser: 0.87 mg/dL (ref 0.50–1.10)
GFR calc Af Amer: 76 mL/min — ABNORMAL LOW (ref >90)

## 2012-04-18 LAB — CBC
MCH: 32.6 pg (ref 26.0–34.0)
MCV: 97.7 fL (ref 78.0–100.0)
Platelets: 303 10*3/uL (ref 150–400)
RDW: 12.8 % (ref 11.5–15.5)

## 2012-04-18 NOTE — Patient Instructions (Signed)
20 Tara Neal  04/18/2012   Your procedure is scheduled on:  04/26/12 130pm-230pm  Report to [facility] at 1100 AM.  Call this number if you have problems the morning of surgery: [phone]   Remember:   Do not eat food:After Midnight.  May have clear liquids:until Midnight .    Take these medicines the morning of surgery with A SIP OF WATER:   Do not wear jewelry, make-up or nail polish.  Do not wear lotions, powders, or perfume  Do not shave 48 hours prior to surgery. .  Do not bring valuables to the hospital.  Contacts, dentures or bridgework may not be worn into surgery.  Leave suitcase in the car. After surgery it may be brought to your room.  For patients admitted to the hospital, checkout time is 11:00 AM the day of discharge.     Special Instructions: CHG Shower Use Special Wash: 1/2 bottle night before surgery and 1/2 bottle morning of surgery. shower chin to toes with CHG.  Wash face and private parts with regular soap.    Please read over the following fact sheets that you were given: MRSA Information, Incentive Spirometry Fact Sheet, coughing and deep breathing exercises, leg exercises

## 2012-04-22 ENCOUNTER — Encounter: Payer: Self-pay | Admitting: Internal Medicine

## 2012-04-22 DIAGNOSIS — G4700 Insomnia, unspecified: Secondary | ICD-10-CM | POA: Insufficient documentation

## 2012-04-22 DIAGNOSIS — F5104 Psychophysiologic insomnia: Secondary | ICD-10-CM

## 2012-04-22 HISTORY — DX: Psychophysiologic insomnia: F51.04

## 2012-04-26 ENCOUNTER — Ambulatory Visit (HOSPITAL_COMMUNITY)
Admission: RE | Admit: 2012-04-26 | Discharge: 2012-04-26 | Disposition: A | Discharge status: Home / Self Care | Payer: Medicare Other | Source: Ambulatory Visit | Attending: Orthopedic Surgery | Admitting: Orthopedic Surgery

## 2012-04-26 ENCOUNTER — Encounter (HOSPITAL_COMMUNITY)
Admission: RE | Disposition: A | Discharge status: Home / Self Care | Payer: Self-pay | Source: Ambulatory Visit | Attending: Orthopedic Surgery

## 2012-04-26 ENCOUNTER — Ambulatory Visit (HOSPITAL_COMMUNITY): Payer: Medicare Other | Admitting: Anesthesiology

## 2012-04-26 ENCOUNTER — Encounter (HOSPITAL_COMMUNITY): Payer: Self-pay | Admitting: *Deleted

## 2012-04-26 ENCOUNTER — Encounter (HOSPITAL_COMMUNITY): Payer: Self-pay | Admitting: Anesthesiology

## 2012-04-26 DIAGNOSIS — M810 Age-related osteoporosis without current pathological fracture: Secondary | ICD-10-CM | POA: Insufficient documentation

## 2012-04-26 DIAGNOSIS — D649 Anemia, unspecified: Secondary | ICD-10-CM | POA: Diagnosis not present

## 2012-04-26 DIAGNOSIS — K219 Gastro-esophageal reflux disease without esophagitis: Secondary | ICD-10-CM | POA: Diagnosis not present

## 2012-04-26 DIAGNOSIS — E785 Hyperlipidemia, unspecified: Secondary | ICD-10-CM | POA: Diagnosis not present

## 2012-04-26 DIAGNOSIS — J4489 Other specified chronic obstructive pulmonary disease: Secondary | ICD-10-CM | POA: Insufficient documentation

## 2012-04-26 DIAGNOSIS — M7061 Trochanteric bursitis, right hip: Secondary | ICD-10-CM | POA: Diagnosis present

## 2012-04-26 DIAGNOSIS — E119 Type 2 diabetes mellitus without complications: Secondary | ICD-10-CM | POA: Diagnosis not present

## 2012-04-26 DIAGNOSIS — I10 Essential (primary) hypertension: Secondary | ICD-10-CM | POA: Diagnosis not present

## 2012-04-26 DIAGNOSIS — M76899 Other specified enthesopathies of unspecified lower limb, excluding foot: Secondary | ICD-10-CM | POA: Insufficient documentation

## 2012-04-26 DIAGNOSIS — E669 Obesity, unspecified: Secondary | ICD-10-CM | POA: Insufficient documentation

## 2012-04-26 DIAGNOSIS — J449 Chronic obstructive pulmonary disease, unspecified: Secondary | ICD-10-CM | POA: Diagnosis not present

## 2012-04-26 DIAGNOSIS — Z79899 Other long term (current) drug therapy: Secondary | ICD-10-CM | POA: Diagnosis not present

## 2012-04-26 DIAGNOSIS — M6688 Spontaneous rupture of other tendons, other: Secondary | ICD-10-CM | POA: Insufficient documentation

## 2012-04-26 DIAGNOSIS — IMO0002 Reserved for concepts with insufficient information to code with codable children: Secondary | ICD-10-CM | POA: Diagnosis not present

## 2012-04-26 HISTORY — PX: EXCISION/RELEASE BURSA HIP: SHX5014

## 2012-04-26 LAB — GLUCOSE, CAPILLARY: Glucose-Capillary: 122 mg/dL — ABNORMAL HIGH (ref 70–99)

## 2012-04-26 SURGERY — RELEASE, BURSA, TROCHANTERIC
Anesthesia: General | Site: Hip | Laterality: Right | Wound class: Clean

## 2012-04-26 MED ORDER — ACETAMINOPHEN 10 MG/ML IV SOLN
INTRAVENOUS | Status: AC
  Filled 2012-04-26: qty 100

## 2012-04-26 MED ORDER — HYDROMORPHONE HCL PF 1 MG/ML IJ SOLN
INTRAMUSCULAR | Status: AC
  Filled 2012-04-26: qty 1

## 2012-04-26 MED ORDER — KETOROLAC TROMETHAMINE 30 MG/ML IJ SOLN: 15.0000 mg | Freq: Once | INTRAMUSCULAR | Status: DC | PRN

## 2012-04-26 MED ORDER — METHOCARBAMOL 500 MG PO TABS: 500.0000 mg | ORAL_TABLET | Freq: Four times a day (QID) | ORAL | Status: AC | PRN

## 2012-04-26 MED ORDER — SODIUM CHLORIDE 0.9 % IJ SOLN
INTRAMUSCULAR | Status: DC | PRN
  Administered 2012-04-26: 50 mL

## 2012-04-26 MED ORDER — ONDANSETRON HCL 4 MG PO TABS: 4.0000 mg | ORAL_TABLET | Freq: Three times a day (TID) | ORAL | Status: AC | PRN

## 2012-04-26 MED ORDER — DEXAMETHASONE SODIUM PHOSPHATE 10 MG/ML IJ SOLN
10.0000 mg | Freq: Once | INTRAMUSCULAR | Status: AC
  Administered 2012-04-26: 10 mg via INTRAVENOUS

## 2012-04-26 MED ORDER — BUPIVACAINE LIPOSOME 1.3 % IJ SUSP
20.0000 mL | Freq: Once | INTRAMUSCULAR | Status: DC
  Filled 2012-04-26: qty 20

## 2012-04-26 MED ORDER — VANCOMYCIN HCL IN DEXTROSE 1-5 GM/200ML-% IV SOLN
1000.0000 mg | INTRAVENOUS | Status: AC
  Administered 2012-04-26: 1000 mg via INTRAVENOUS

## 2012-04-26 MED ORDER — LACTATED RINGERS IV SOLN
INTRAVENOUS | Status: DC
  Administered 2012-04-26: 1000 mL via INTRAVENOUS
  Administered 2012-04-26: 15:00:00 via INTRAVENOUS

## 2012-04-26 MED ORDER — PROPOFOL 10 MG/ML IV EMUL
INTRAVENOUS | Status: DC | PRN
  Administered 2012-04-26: 150 mg via INTRAVENOUS

## 2012-04-26 MED ORDER — SODIUM CHLORIDE 0.9 % IV SOLN: INTRAVENOUS | Status: DC

## 2012-04-26 MED ORDER — ROCURONIUM BROMIDE 100 MG/10ML IV SOLN
INTRAVENOUS | Status: DC | PRN
  Administered 2012-04-26: 2 mg via INTRAVENOUS

## 2012-04-26 MED ORDER — LIDOCAINE HCL (CARDIAC) 20 MG/ML IV SOLN
INTRAVENOUS | Status: DC | PRN
  Administered 2012-04-26: 50 mg via INTRAVENOUS

## 2012-04-26 MED ORDER — EPHEDRINE SULFATE 50 MG/ML IJ SOLN
INTRAMUSCULAR | Status: DC | PRN
  Administered 2012-04-26 (×2): 10 mg via INTRAVENOUS

## 2012-04-26 MED ORDER — ACETAMINOPHEN 10 MG/ML IV SOLN
1000.0000 mg | Freq: Once | INTRAVENOUS | Status: AC
  Administered 2012-04-26: 1000 mg via INTRAVENOUS

## 2012-04-26 MED ORDER — SUCCINYLCHOLINE CHLORIDE 20 MG/ML IJ SOLN
INTRAMUSCULAR | Status: DC | PRN
  Administered 2012-04-26: 100 mg via INTRAVENOUS

## 2012-04-26 MED ORDER — HYDROMORPHONE HCL 2 MG PO TABS: 2.0000 mg | ORAL_TABLET | ORAL | Status: AC | PRN

## 2012-04-26 MED ORDER — PROMETHAZINE HCL 25 MG/ML IJ SOLN: 6.2500 mg | INTRAMUSCULAR | Status: DC | PRN

## 2012-04-26 MED ORDER — HYDROMORPHONE HCL PF 1 MG/ML IJ SOLN
0.2500 mg | INTRAMUSCULAR | Status: DC | PRN
  Administered 2012-04-26 (×4): 0.5 mg via INTRAVENOUS

## 2012-04-26 MED ORDER — FENTANYL CITRATE 0.05 MG/ML IJ SOLN
INTRAMUSCULAR | Status: DC | PRN
  Administered 2012-04-26: 100 ug via INTRAVENOUS
  Administered 2012-04-26 (×3): 50 ug via INTRAVENOUS

## 2012-04-26 MED ORDER — ONDANSETRON HCL 4 MG/2ML IJ SOLN
INTRAMUSCULAR | Status: DC | PRN
  Administered 2012-04-26: 4 mg via INTRAVENOUS

## 2012-04-26 MED ORDER — BUPIVACAINE LIPOSOME 1.3 % IJ SUSP
INTRAMUSCULAR | Status: DC | PRN
  Administered 2012-04-26: 20 mL

## 2012-04-26 MED ORDER — VANCOMYCIN HCL IN DEXTROSE 1-5 GM/200ML-% IV SOLN
INTRAVENOUS | Status: AC
  Filled 2012-04-26: qty 200

## 2012-04-26 MED ORDER — 0.9 % SODIUM CHLORIDE (POUR BTL) OPTIME
TOPICAL | Status: DC | PRN
  Administered 2012-04-26: 1000 mL

## 2012-04-26 MED ORDER — CHLORHEXIDINE GLUCONATE 4 % EX LIQD
60.0000 mL | Freq: Once | CUTANEOUS | Status: DC
  Filled 2012-04-26: qty 60

## 2012-04-26 SURGICAL SUPPLY — 36 items
BAG SPEC THK2 15X12 ZIP CLS (MISCELLANEOUS) ×1
BAG ZIPLOCK 12X15 (MISCELLANEOUS) ×2 IMPLANT
BIT DRILL 2.8X128 (BIT) ×1 IMPLANT
BLADE EXTENDED COATED 6.5IN (ELECTRODE) ×1 IMPLANT
CLOTH BEACON ORANGE TIMEOUT ST (SAFETY) ×2 IMPLANT
DRAPE INCISE IOBAN 66X45 STRL (DRAPES) ×2 IMPLANT
DRAPE ORTHO SPLIT 77X108 STRL (DRAPES) ×4
DRAPE POUCH INSTRU U-SHP 10X18 (DRAPES) ×2 IMPLANT
DRAPE SURG ORHT 6 SPLT 77X108 (DRAPES) ×2 IMPLANT
DRAPE U-SHAPE 47X51 STRL (DRAPES) ×2 IMPLANT
DRSG ADAPTIC 3X8 NADH LF (GAUZE/BANDAGES/DRESSINGS) ×2 IMPLANT
DRSG MEPILEX BORDER 4X4 (GAUZE/BANDAGES/DRESSINGS) IMPLANT
DRSG MEPILEX BORDER 4X8 (GAUZE/BANDAGES/DRESSINGS) ×1 IMPLANT
ELECT REM PT RETURN 9FT ADLT (ELECTROSURGICAL) ×2
ELECTRODE REM PT RTRN 9FT ADLT (ELECTROSURGICAL) ×1 IMPLANT
GLOVE BIO SURGEON STRL SZ7.5 (GLOVE) ×2 IMPLANT
GLOVE BIO SURGEON STRL SZ8 (GLOVE) ×3 IMPLANT
GOWN STRL NON-REIN LRG LVL3 (GOWN DISPOSABLE) ×3 IMPLANT
GOWN STRL REIN XL XLG (GOWN DISPOSABLE) ×2 IMPLANT
IV SET HUBERPLUS 22X1 SAFETY (NEEDLE) ×4 IMPLANT
MANIFOLD NEPTUNE II (INSTRUMENTS) ×2 IMPLANT
NS IRRIG 1000ML POUR BTL (IV SOLUTION) ×2 IMPLANT
PACK TOTAL JOINT (CUSTOM PROCEDURE TRAY) ×2 IMPLANT
PASSER SUT SWANSON 36MM LOOP (INSTRUMENTS) ×1 IMPLANT
POSITIONER SURGICAL ARM (MISCELLANEOUS) ×2 IMPLANT
SPONGE GAUZE 4X4 12PLY (GAUZE/BANDAGES/DRESSINGS) ×2 IMPLANT
STAPLER VISISTAT 35W (STAPLE) ×1 IMPLANT
STRIP CLOSURE SKIN 1/2X4 (GAUZE/BANDAGES/DRESSINGS) ×3 IMPLANT
SUT ETHIBOND NAB CT1 #1 30IN (SUTURE) ×2 IMPLANT
SUT MNCRL AB 4-0 PS2 18 (SUTURE) ×2 IMPLANT
SUT VIC AB 1 CT1 27 (SUTURE) ×4
SUT VIC AB 1 CT1 27XBRD ANTBC (SUTURE) ×3 IMPLANT
SUT VIC AB 2-0 CT1 27 (SUTURE) ×6
SUT VIC AB 2-0 CT1 TAPERPNT 27 (SUTURE) ×3 IMPLANT
SYR 30ML LL (SYRINGE) ×2 IMPLANT
TOWEL OR 17X26 10 PK STRL BLUE (TOWEL DISPOSABLE) ×4 IMPLANT

## 2012-04-26 NOTE — H&P (Signed)
CC- Tara Neal is a 71 y.o. female who presents with right hip pain  Hip Pain: Patient complains of right hip pain. Onset of the symptoms was several months ago. Inciting event: none. Current symptoms include lateral pain and limp. Associated symptoms: none. Aggravating symptoms: going up and down stairs, rising after sitting and walking. Patient has had no prior hip problems. Evaluation to date: plain films, which were normal and MRI showing gluteal tendon tear.  Treatment to date: injection.  Past Medical History  Diagnosis Date  . Hyperlipidemia   . Hypertension   . Mild obesity   . Migraine headache   . Spontaneous pneumothorax   . GERD (gastroesophageal reflux disease)   . Osteoarthritis   . Low back pain   . Osteoporosis   . Hormone replacement therapy (postmenopausal)   . Anxiety   . Osteopenia   . COPD (chronic obstructive pulmonary disease)   . Diverticulosis of colon   . Pneumonia     hx of 5 years ago   . Shortness of breath     with exertion   . Anemia     hx of   . Diabetes mellitus     pt on no medications  . Chronic insomnia 04/22/2012    Past Surgical History  Procedure Date  . Tonsillectomy   . Lumbar disc surgery      x 2  . Eye surgery     cataract extraction  . Other surgical history     benign growth removed from right under ear at age 43     Prior to Admission medications   Medication Sig Start Date End Date Taking? Authorizing Provider  alendronate (FOSAMAX) 70 MG tablet Take 70 mg by mouth every 7 (seven) days. On Mondays. Take with a full glass of water on an empty stomach. 12/13/11   Corwin Levins, MD  aspirin 81 MG tablet Take 81 mg by mouth daily with breakfast.     Historical Provider, MD  atenolol (TENORMIN) 50 MG tablet Take 50 mg by mouth daily with breakfast. 03/09/12 03/09/13  Corwin Levins, MD  benazepril (LOTENSIN) 40 MG tablet Take 40 mg by mouth daily with breakfast. 12/13/11   Corwin Levins, MD  calcium carbonate (OS-CAL) 600 MG TABS  Take 600 mg by mouth 2 (two) times daily with a meal.    Historical Provider, MD  cetirizine (ZYRTEC) 10 MG tablet Take 10 mg by mouth daily with breakfast.     Historical Provider, MD  Cholecalciferol (VITAMIN D3) 1000 UNITS CAPS Take 1 capsule by mouth daily.    Historical Provider, MD  furosemide (LASIX) 40 MG tablet Take 40 mg by mouth daily with breakfast. 03/09/12 03/09/13  Corwin Levins, MD  HYDROcodone-acetaminophen (NORCO) 5-325 MG per tablet Take 1 tablet by mouth every 6 (six) hours as needed. 1 tab by mouth every 6 hrs as needed for pain 12/13/11   Corwin Levins, MD  meloxicam [facility]) 15 MG tablet Take 15 mg by mouth daily with breakfast. 12/13/11   Corwin Levins, MD  Multiple Vitamin (MULTIVITAMIN) tablet Take 1 tablet by mouth daily.    Historical Provider, MD  omeprazole (PRILOSEC) 20 MG capsule Take 20 mg by mouth daily with breakfast. 03/09/12 03/09/13  Corwin Levins, MD  simvastatin (ZOCOR) 40 MG tablet Take 40 mg by mouth daily with breakfast. 03/09/12 03/09/13  Corwin Levins, MD  venlafaxine XR (EFFEXOR-XR) 150 MG 24 hr capsule Take 150 mg  by mouth daily with breakfast. 03/09/12   Corwin Levins, MD    Physical Examination: General appearance - alert, well appearing, and in no distress Mental status - alert, oriented to person, place, and time Chest - clear to auscultation, no wheezes, rales or rhonchi, symmetric air entry Heart - normal rate, regular rhythm, normal S1, S2, no murmurs, rubs, clicks or gallops Abdomen - soft, nontender, nondistended, no masses or organomegaly Neurological - alert, oriented, normal speech, no focal findings or movement disorder noted  A right hip exam was performed. TENDERNESS: lateral ROM: normal STRENGTH: normal GAIT: antalgic  ASSESSMENT/PLAN: Right hip intractable bursitis and gluteal tendon tear- Plan bursectomy and gluteal tendon repair. Discussed in detail with patient who elects to proceed. Goals are decreased pain and improved function, both of  which should be achieved.  Gus Rankin Cullen Vanallen, MD    04/26/2012, 11:57 AM

## 2012-04-26 NOTE — Anesthesia Preprocedure Evaluation (Signed)
Anesthesia Evaluation  Patient identified by MRN, date of birth, ID band Patient awake    Reviewed: Allergy & Precautions, H&P , NPO status , Patient's Chart, lab work & pertinent test results  Airway Mallampati: II TM Distance: <3 FB Neck ROM: Full    Dental No notable dental hx.    Pulmonary neg pulmonary ROS, COPD breath sounds clear to auscultation  Pulmonary exam normal       Cardiovascular hypertension, Pt. on medications Rhythm:Regular Rate:Normal     Neuro/Psych Anxiety negative neurological ROS     GI/Hepatic Neg liver ROS, GERD-  Medicated,  Endo/Other  negative endocrine ROS  Renal/GU negative Renal ROS  negative genitourinary   Musculoskeletal negative musculoskeletal ROS (+)   Abdominal   Peds negative pediatric ROS (+)  Hematology negative hematology ROS (+)   Anesthesia Other Findings   Reproductive/Obstetrics negative OB ROS                           Anesthesia Physical Anesthesia Plan  ASA: II  Anesthesia Plan: General   Post-op Pain Management:    Induction: Intravenous  Airway Management Planned: Oral ETT  Additional Equipment:   Intra-op Plan:   Post-operative Plan: Extubation in OR  Informed Consent: I have reviewed the patients History and Physical, chart, labs and discussed the procedure including the risks, benefits and alternatives for the proposed anesthesia with the patient or authorized representative who has indicated his/her understanding and acceptance.   Dental advisory given  Plan Discussed with: CRNA  Anesthesia Plan Comments:         Anesthesia Quick Evaluation

## 2012-04-26 NOTE — Interval H&P Note (Signed)
History and Physical Interval Note:  04/26/2012 1:48 PM  Tara Neal  has presented today for surgery, with the diagnosis of right hip bursitis   The various methods of treatment have been discussed with the patient and family. After consideration of risks, benefits and other options for treatment, the patient has consented to  Procedure(s) (LRB): EXCISION/RELEASE BURSA HIP (Right) as a surgical intervention .  The patients' history has been reviewed, patient examined, no change in status, stable for surgery.  I have reviewed the patients' chart and labs.  Questions were answered to the patient's satisfaction.     Loanne [name]

## 2012-04-26 NOTE — Brief Op Note (Signed)
04/26/2012  3:02 PM  PATIENT:  Tara Neal  71 y.o. female  PRE-OPERATIVE DIAGNOSIS:  right hip bursitis, gluteal tendon tear  POST-OPERATIVE DIAGNOSIS:  right hip bursitis, gluteal tendon tear  PROCEDURE:  Procedure(s) (LRB): EXCISION/RELEASE BURSA HIP (Right) OPEN SURGICAL REPAIR OF GLUTEAL TENDON (Right)  SURGEON:  Surgeon(s) and Role:    * Loanne [name], MD - Primary  PHYSICIAN ASSISTANT:   ASSISTANTS: Avel Peace, PA-C   ANESTHESIA:   general  EBL:  Total I/O In: 1000 [I.V.:1000] Out: -   BLOOD ADMINISTERED:none  DRAINS: none   LOCAL MEDICATIONS USED:  OTHER Exparel  DICTATION: .Other Dictation: Dictation Number [id number]  PLAN OF CARE: Discharge to home after PACU  PATIENT DISPOSITION:  PACU - hemodynamically stable.  Tara Rankin Abuk Selleck, MD    04/26/2012, 3:07 PM

## 2012-04-26 NOTE — Transfer of Care (Signed)
Immediate Anesthesia Transfer of Care Note  Patient: Tara Neal  Procedure(s) Performed: Procedure(s) (LRB): EXCISION/RELEASE BURSA HIP (Right) OPEN SURGICAL REPAIR OF GLUTEAL TENDON (Right)  Patient Location: PACU  Anesthesia Type: General  Level of Consciousness: awake, sedated and patient cooperative  Airway & Oxygen Therapy: Patient Spontanous Breathing and Patient connected to face mask oxygen  Post-op Assessment: Report given to PACU RN and Post -op Vital signs reviewed and stable  Post vital signs: Reviewed and stable  Complications: No apparent anesthesia complications

## 2012-04-26 NOTE — Preoperative (Signed)
Beta Blockers   Reason not to administer Beta Blockers:Took atenolol 50 mg po this am at 0900.

## 2012-04-26 NOTE — Anesthesia Postprocedure Evaluation (Signed)
  Anesthesia Post-op Note  Patient: Tara Neal  Procedure(s) Performed: Procedure(s) (LRB): EXCISION/RELEASE BURSA HIP (Right) OPEN SURGICAL REPAIR OF GLUTEAL TENDON (Right)  Patient Location: PACU  Anesthesia Type: General  Level of Consciousness: awake and alert   Airway and Oxygen Therapy: Patient Spontanous Breathing  Post-op Pain: mild  Post-op Assessment: Post-op Vital signs reviewed, Patient's Cardiovascular Status Stable, Respiratory Function Stable, Patent Airway and No signs of Nausea or vomiting  Post-op Vital Signs: stable  Complications: No apparent anesthesia complications

## 2012-04-27 ENCOUNTER — Encounter (HOSPITAL_COMMUNITY): Payer: Self-pay | Admitting: Orthopedic Surgery

## 2012-04-27 NOTE — Op Note (Signed)
[name], [name]                  ACCOUNT NO.:  [id number]  MEDICAL RECORD NO.:  [id number]  LOCATION:  [facility]                         FACILITY:  [facility]  PHYSICIAN:  Ollen Gross, M.D.    DATE OF BIRTH:  Jun 11, 1941  DATE OF PROCEDURE: DATE OF DISCHARGE:  04/26/2012                              OPERATIVE REPORT   PREOPERATIVE DIAGNOSIS:  Intractable bursitis, right hip with gluteus minimus tendon tear.  POSTOPERATIVE DIAGNOSIS:  Intractable bursitis, right hip with gluteus minimus tendon tear.  PROCEDURE:  Right hip bursectomy with gluteus minimus tendon repair.  SURGEON:  Ollen Gross, M.D.  ASSISTANT:  Alexzandrew L. Perkins, P.A.C.  ANESTHESIA:  General.  ESTIMATED BLOOD LOSS:  Minimal.  DRAINS:  None.  COMPLICATION:  None.  CONDITION:  Stable to Recovery.  BRIEF CLINICAL NOTE:  [name] is a 71 year old female, intractable right hip pain.  She has attempted injections, physical therapy, and medications without benefit.  Exam and history suggested a tendon tear. This was confirmed by MRI.  She presents now for bursectomy and tendon repair.  PROCEDURE IN DETAIL:  After successful administration of general anesthetic, the patient was placed in left lateral decubitus position with the right side up and held with the hip positioner.  Right lower extremity was isolated from perineum with plastic drapes and prepped and draped in usual sterile fashion.  Short lateral incision was made, centered over the tip of the greater trochanter.  The skin was cut with a 10 blade through subcutaneous tissue to the level of fascia lata which incised in line with the skin incision.  Retractors were placed and the bursa was identified.  The bursa was removed.  It was calcified thickened bursa.  The gluteus medius muscle and tendon appeared intact. I split between the fibers of the medius to get to the minimus.  There was an obvious tear with about a cm retraction of the posterior half  of the gluteus minimus tendon.  I placed Ethibond sutures through the free edges of the tendon.  I then drilled two holes in the tip of the greater trochanter.  I passed the suture passer through the holes in, brought the edges of the suture up through those.  They were tied over the greater trochanter and edge of the tendon is directly advanced to the trochanter.  It felt to be a very stable repair.  Meticulous hemostasis was achieved.  Then the wound was copiously irrigated with saline solution.  Fascia lata was closed with interrupted #1 Vicryl leaving open a triangular area over the tip of the greater trochanter.  I removed that tissue.  This was to prevent rubbing of the tendon over the tip of the trochanter.  Prior to this, I injected a total of 20 mL of Exparel mixed with 50 mL of saline into the gluteal muscles, the fascia lata, and subcu tissues.  Subcu was then closed with interrupted #1 and interrupted 2-0 Vicryl and subcuticular running 4-0 Monocryl.  Incision was cleaned and dried, and Steri-Strips and a bulky sterile dressing applied.  She was then awakened and transported to Recovery in stable condition.     Ollen Gross, M.D.  FA/MEDQ  D:  04/26/2012  T:  04/27/2012  Job:  [id number]

## 2012-08-01 DIAGNOSIS — M76899 Other specified enthesopathies of unspecified lower limb, excluding foot: Secondary | ICD-10-CM | POA: Diagnosis not present

## 2012-08-02 DIAGNOSIS — Z23 Encounter for immunization: Secondary | ICD-10-CM | POA: Diagnosis not present

## 2012-08-19 ENCOUNTER — Encounter (HOSPITAL_COMMUNITY): Payer: Self-pay | Admitting: Emergency Medicine

## 2012-08-19 ENCOUNTER — Emergency Department (HOSPITAL_COMMUNITY): Payer: Medicare Other

## 2012-08-19 ENCOUNTER — Emergency Department (HOSPITAL_COMMUNITY)
Admission: EM | Admit: 2012-08-19 | Discharge: 2012-08-19 | Disposition: A | Discharge status: Home / Self Care | Payer: Medicare Other | Source: Home / Self Care | Attending: Emergency Medicine | Admitting: Emergency Medicine

## 2012-08-19 DIAGNOSIS — Z79899 Other long term (current) drug therapy: Secondary | ICD-10-CM | POA: Insufficient documentation

## 2012-08-19 DIAGNOSIS — Z043 Encounter for examination and observation following other accident: Secondary | ICD-10-CM | POA: Diagnosis not present

## 2012-08-19 DIAGNOSIS — Y92009 Unspecified place in unspecified non-institutional (private) residence as the place of occurrence of the external cause: Secondary | ICD-10-CM | POA: Insufficient documentation

## 2012-08-19 DIAGNOSIS — E119 Type 2 diabetes mellitus without complications: Secondary | ICD-10-CM | POA: Insufficient documentation

## 2012-08-19 DIAGNOSIS — S52599A Other fractures of lower end of unspecified radius, initial encounter for closed fracture: Secondary | ICD-10-CM | POA: Insufficient documentation

## 2012-08-19 DIAGNOSIS — S52509A Unspecified fracture of the lower end of unspecified radius, initial encounter for closed fracture: Secondary | ICD-10-CM

## 2012-08-19 DIAGNOSIS — I10 Essential (primary) hypertension: Secondary | ICD-10-CM | POA: Insufficient documentation

## 2012-08-19 DIAGNOSIS — J4489 Other specified chronic obstructive pulmonary disease: Secondary | ICD-10-CM | POA: Insufficient documentation

## 2012-08-19 DIAGNOSIS — W010XXA Fall on same level from slipping, tripping and stumbling without subsequent striking against object, initial encounter: Secondary | ICD-10-CM | POA: Insufficient documentation

## 2012-08-19 DIAGNOSIS — J449 Chronic obstructive pulmonary disease, unspecified: Secondary | ICD-10-CM | POA: Diagnosis not present

## 2012-08-19 HISTORY — DX: Scoliosis, unspecified: M41.9

## 2012-08-19 MED ORDER — OXYCODONE-ACETAMINOPHEN 5-325 MG PO TABS
1.0000 | ORAL_TABLET | Freq: Once | ORAL | Status: AC
  Administered 2012-08-19: 1 via ORAL
  Filled 2012-08-19: qty 1

## 2012-08-19 MED ORDER — OXYCODONE-ACETAMINOPHEN 5-325 MG PO TABS: 1.0000 | ORAL_TABLET | Freq: Four times a day (QID) | ORAL | Status: DC | PRN

## 2012-08-19 NOTE — ED Provider Notes (Signed)
History     CSN: [id number]  Arrival date & time 08/19/12  1346   First MD Initiated Contact with Patient 08/19/12 1406      Chief Complaint  Patient presents with  . Wrist Pain    (Consider location/radiation/quality/duration/timing/severity/associated sxs/prior treatment) HPI Comments: 71 y/o female presents tot he ED with her family with elft wrist pain s/p tripping and falling in her garden earlier today. States she fell with her hand stretched out. Denies hitting her head or LOC. Pain has increased since coming to ED rated 10/10 radiating up her arm. Swelling is increasing. Has not tried any alleviating factors due to coming directly to ED. Denies any numbness or tingling in her arm or hand.   Patient is a 71 y.o. female presenting with wrist pain. The history is provided by the patient and a relative.  Wrist Pain Associated symptoms include joint swelling. Pertinent negatives include no numbness.    Past Medical History  Diagnosis Date  . Hyperlipidemia   . Hypertension   . Mild obesity   . Migraine headache   . Spontaneous pneumothorax   . GERD (gastroesophageal reflux disease)   . Osteoarthritis   . Low back pain   . Osteoporosis   . Hormone replacement therapy (postmenopausal)   . Anxiety   . Osteopenia   . COPD (chronic obstructive pulmonary disease)   . Diverticulosis of colon   . Pneumonia     hx of 5 years ago   . Shortness of breath     with exertion   . Anemia     hx of   . Diabetes mellitus     pt on no medications  . Chronic insomnia 04/22/2012  . Scoliosis     Past Surgical History  Procedure Date  . Tonsillectomy   . Lumbar disc surgery      x 2  . Eye surgery     cataract extraction  . Other surgical history     benign growth removed from right under ear at age 72   . Excision/release bursa hip 04/26/2012    Procedure: EXCISION/RELEASE BURSA HIP;  Surgeon: Loanne [name], MD;  Location: [facility];  Service: Orthopedics;  Laterality: Right;    . Hip adductor tenotomy     Family History  Problem Relation Age of Onset  . Hyperlipidemia Mother   . Heart disease Mother   . Diabetes Mother     History  Substance Use Topics  . Smoking status: Former Smoker -- 20 years    Types: Cigarettes    Quit date: 11/29/1981  . Smokeless tobacco: Never Used  . Alcohol Use: No    OB History    Grav Para Term Preterm Abortions TAB SAB Ect Mult Living                  Review of Systems  Constitutional: Negative for activity change.  Musculoskeletal: Positive for joint swelling.       Left wrist pain  Skin: Negative for color change.  Neurological: Negative for syncope and numbness.       No LOC    Allergies  Codeine; Penicillins; and Sulfonamide derivatives  Home Medications   Current Outpatient Rx  Name Route Sig Dispense Refill  . ALENDRONATE SODIUM 70 MG PO TABS Oral Take 70 mg by mouth every 7 (seven) days. On Mondays. Take with a full glass of water on an empty stomach.    . ATENOLOL 50 MG PO TABS Oral  Take 50 mg by mouth daily with breakfast.    . BENAZEPRIL HCL 40 MG PO TABS Oral Take 40 mg by mouth daily with breakfast.    . CETIRIZINE HCL 10 MG PO TABS Oral Take 10 mg by mouth daily with breakfast.     . VITAMIN D3 1000 UNITS PO CAPS Oral Take 1 capsule by mouth daily.    . FUROSEMIDE 40 MG PO TABS Oral Take 40 mg by mouth daily with breakfast.    . HYDROCODONE-ACETAMINOPHEN 5-325 MG PO TABS Oral Take 1 tablet by mouth every 6 (six) hours as needed. 1 tab by mouth every 6 hrs as needed for pain    . MELOXICAM 15 MG PO TABS Oral Take 15 mg by mouth daily with breakfast.    . ONE-DAILY MULTI VITAMINS PO TABS Oral Take 1 tablet by mouth daily.    Marland Kitchen OMEPRAZOLE 20 MG PO CPDR Oral Take 20 mg by mouth daily.     Marland Kitchen SIMVASTATIN 40 MG PO TABS Oral Take 40 mg by mouth daily with breakfast.    . VENLAFAXINE HCL ER 150 MG PO CP24 Oral Take 150 mg by mouth daily with breakfast.    . ASPIRIN 81 MG PO TABS Oral Take 81 mg by  mouth daily with breakfast.     . CALCIUM CARBONATE 600 MG PO TABS Oral Take 600 mg by mouth 2 (two) times daily with a meal.      BP 145/80  Pulse 70  Temp 98.1 F (36.7 C) (Oral)  SpO2 100%  Physical Exam  Constitutional: She is oriented to person, place, and time. She appears well-developed and well-nourished. No distress.  HENT:  Head: Normocephalic and atraumatic.  Eyes: Conjunctivae normal are normal.  Neck: Normal range of motion.  Cardiovascular: Normal rate, regular rhythm and normal heart sounds.   Pulses:      Radial pulses are 2+ on the left side.       Capillary refill < 3 seconds  Pulmonary/Chest: Effort normal and breath sounds normal.  Musculoskeletal:       Left wrist: She exhibits decreased range of motion, tenderness, bony tenderness, swelling and deformity (hand dorsally displaced).       Left hand: She exhibits tenderness (mild) and swelling (mild). She exhibits normal capillary refill. normal sensation noted.  Neurological: She is alert and oriented to person, place, and time. No sensory deficit.  Skin: Skin is warm, dry and intact. No bruising and no ecchymosis noted.  Psychiatric: She has a normal mood and affect. Her speech is normal and behavior is normal.    ED Course  Procedures (including critical care time)  Labs Reviewed - No data to display Dg Wrist Complete Left  08/19/2012  *RADIOLOGY REPORT*  Clinical Data: Left wrist pain post fall  LEFT WRIST - COMPLETE 3+ VIEW  Comparison: None.  Findings: Four views of the left wrist submitted.  There is a displaced mild impacted fracture of distal left radius.  IMPRESSION: Displaced mild impacted fracture distal left radius.   Original Report Authenticated By: Natasha Mead, M.D.      1. Fracture, radius, distal       MDM  71 y/o female with displaced mildly impacted fracture of left distal radius. Obvious deformity on exam. Dr. Shelle Iron consulted- spoke with him on phone who feels as if she can be splinted  with follow up with Dr. Amanda Pea Monday since he is the hand specialist. Splint and sling applied. Pain medication given. Case  discussed with Dr. Lynelle Doctor who also evaluated patient and agrees with plan of care.      Trevor Mace, PA-C 08/19/12 1610

## 2012-08-19 NOTE — ED Provider Notes (Signed)
Medical screening examination/treatment/procedure(s) were performed by non-physician practitioner and as supervising physician I was immediately available for consultation/collaboration.   Celene Kras, MD 08/19/12 [phone]

## 2012-08-19 NOTE — ED Notes (Signed)
Pt fell outside in flower bed while working, left wrist has pain, swelling, and deformity. Pulses present radial and ulnar, can move fingers but painful

## 2012-08-21 ENCOUNTER — Encounter (HOSPITAL_COMMUNITY): Payer: Self-pay | Admitting: *Deleted

## 2012-08-21 ENCOUNTER — Other Ambulatory Visit: Payer: Self-pay | Admitting: Orthopedic Surgery

## 2012-08-21 DIAGNOSIS — S52599A Other fractures of lower end of unspecified radius, initial encounter for closed fracture: Secondary | ICD-10-CM | POA: Diagnosis not present

## 2012-08-21 MED ORDER — CHLORHEXIDINE GLUCONATE 4 % EX LIQD: 60.0000 mL | Freq: Once | CUTANEOUS | Status: DC

## 2012-08-21 MED ORDER — SODIUM CHLORIDE 0.45 % IV SOLN: INTRAVENOUS | Status: DC

## 2012-08-21 MED ORDER — VANCOMYCIN HCL IN DEXTROSE 1-5 GM/200ML-% IV SOLN
1000.0000 mg | INTRAVENOUS | Status: AC
  Administered 2012-08-22: 1000 mg via INTRAVENOUS
  Filled 2012-08-21: qty 200

## 2012-08-22 ENCOUNTER — Encounter (HOSPITAL_COMMUNITY): Payer: Self-pay | Admitting: Anesthesiology

## 2012-08-22 ENCOUNTER — Encounter (HOSPITAL_COMMUNITY): Payer: Self-pay | Admitting: Pharmacy Technician

## 2012-08-22 ENCOUNTER — Encounter (HOSPITAL_COMMUNITY)
Admission: RE | Disposition: A | Discharge status: Home / Self Care | Payer: Self-pay | Source: Ambulatory Visit | Attending: Orthopedic Surgery

## 2012-08-22 ENCOUNTER — Encounter (HOSPITAL_COMMUNITY): Payer: Self-pay | Admitting: *Deleted

## 2012-08-22 ENCOUNTER — Ambulatory Visit (HOSPITAL_COMMUNITY): Payer: Medicare Other | Admitting: Anesthesiology

## 2012-08-22 ENCOUNTER — Observation Stay (HOSPITAL_COMMUNITY)
Admission: RE | Admit: 2012-08-22 | Discharge: 2012-08-23 | Disposition: A | Discharge status: Home / Self Care | Payer: Medicare Other | Source: Ambulatory Visit | Attending: Orthopedic Surgery | Admitting: Orthopedic Surgery

## 2012-08-22 DIAGNOSIS — S52509A Unspecified fracture of the lower end of unspecified radius, initial encounter for closed fracture: Secondary | ICD-10-CM

## 2012-08-22 DIAGNOSIS — J449 Chronic obstructive pulmonary disease, unspecified: Secondary | ICD-10-CM | POA: Insufficient documentation

## 2012-08-22 DIAGNOSIS — K219 Gastro-esophageal reflux disease without esophagitis: Secondary | ICD-10-CM | POA: Diagnosis not present

## 2012-08-22 DIAGNOSIS — I10 Essential (primary) hypertension: Secondary | ICD-10-CM | POA: Insufficient documentation

## 2012-08-22 DIAGNOSIS — S52599A Other fractures of lower end of unspecified radius, initial encounter for closed fracture: Principal | ICD-10-CM | POA: Insufficient documentation

## 2012-08-22 DIAGNOSIS — E119 Type 2 diabetes mellitus without complications: Secondary | ICD-10-CM | POA: Insufficient documentation

## 2012-08-22 DIAGNOSIS — Y92009 Unspecified place in unspecified non-institutional (private) residence as the place of occurrence of the external cause: Secondary | ICD-10-CM | POA: Insufficient documentation

## 2012-08-22 DIAGNOSIS — E669 Obesity, unspecified: Secondary | ICD-10-CM | POA: Diagnosis not present

## 2012-08-22 DIAGNOSIS — W010XXA Fall on same level from slipping, tripping and stumbling without subsequent striking against object, initial encounter: Secondary | ICD-10-CM | POA: Insufficient documentation

## 2012-08-22 DIAGNOSIS — M549 Dorsalgia, unspecified: Secondary | ICD-10-CM | POA: Diagnosis not present

## 2012-08-22 DIAGNOSIS — J4489 Other specified chronic obstructive pulmonary disease: Secondary | ICD-10-CM | POA: Insufficient documentation

## 2012-08-22 LAB — COMPREHENSIVE METABOLIC PANEL
Albumin: 3.7 g/dL (ref 3.5–5.2)
Alkaline Phosphatase: 74 U/L (ref 39–117)
BUN: 19 mg/dL (ref 6–23)
CO2: 26 mEq/L (ref 19–32)
Chloride: 99 mEq/L (ref 96–112)
Potassium: 3.9 mEq/L (ref 3.5–5.1)
Total Bilirubin: 0.5 mg/dL (ref 0.3–1.2)

## 2012-08-22 LAB — CBC WITH DIFFERENTIAL/PLATELET
Basophils Relative: 0 % (ref 0–1)
Hemoglobin: 11.7 g/dL — ABNORMAL LOW (ref 12.0–15.0)
Lymphocytes Relative: 44 % (ref 12–46)
Lymphs Abs: 5 10*3/uL — ABNORMAL HIGH (ref 0.7–4.0)
MCHC: 33.3 g/dL (ref 30.0–36.0)
Monocytes Relative: 7 % (ref 3–12)
Neutro Abs: 5.4 10*3/uL (ref 1.7–7.7)
Neutrophils Relative %: 47 % (ref 43–77)
RBC: 3.72 MIL/uL — ABNORMAL LOW (ref 3.87–5.11)
WBC: 11.4 10*3/uL — ABNORMAL HIGH (ref 4.0–10.5)

## 2012-08-22 LAB — APTT: aPTT: 33 seconds (ref 24–37)

## 2012-08-22 LAB — SURGICAL PCR SCREEN: Staphylococcus aureus: NEGATIVE

## 2012-08-22 LAB — PROTIME-INR: Prothrombin Time: 13.3 seconds (ref 11.6–15.2)

## 2012-08-22 SURGERY — OPEN REDUCTION INTERNAL FIXATION (ORIF) DISTAL RADIUS FRACTURE
Anesthesia: General | Site: Wrist | Laterality: Left | Wound class: Clean

## 2012-08-22 MED ORDER — BENAZEPRIL HCL 40 MG PO TABS
40.0000 mg | ORAL_TABLET | Freq: Every day | ORAL | Status: DC
  Administered 2012-08-23: 40 mg via ORAL
  Filled 2012-08-22 (×2): qty 1

## 2012-08-22 MED ORDER — METHOCARBAMOL 100 MG/ML IJ SOLN
500.0000 mg | Freq: Four times a day (QID) | INTRAVENOUS | Status: DC | PRN
  Administered 2012-08-22: 500 mg via INTRAVENOUS
  Filled 2012-08-22: qty 5

## 2012-08-22 MED ORDER — ONDANSETRON HCL 4 MG PO TABS: 4.0000 mg | ORAL_TABLET | Freq: Four times a day (QID) | ORAL | Status: DC | PRN

## 2012-08-22 MED ORDER — METHOCARBAMOL 500 MG PO TABS
500.0000 mg | ORAL_TABLET | Freq: Four times a day (QID) | ORAL | Status: DC
  Administered 2012-08-23 (×2): 500 mg via ORAL
  Filled 2012-08-22 (×5): qty 1

## 2012-08-22 MED ORDER — 0.9 % SODIUM CHLORIDE (POUR BTL) OPTIME
TOPICAL | Status: DC | PRN
  Administered 2012-08-22: 1000 mL

## 2012-08-22 MED ORDER — DOCUSATE SODIUM 100 MG PO CAPS
100.0000 mg | ORAL_CAPSULE | Freq: Two times a day (BID) | ORAL | Status: DC
  Administered 2012-08-23: 100 mg via ORAL
  Filled 2012-08-22: qty 1

## 2012-08-22 MED ORDER — ACETAMINOPHEN 10 MG/ML IV SOLN
INTRAVENOUS | Status: AC
  Filled 2012-08-22: qty 100

## 2012-08-22 MED ORDER — VITAMIN C 500 MG PO TABS
1000.0000 mg | ORAL_TABLET | Freq: Every day | ORAL | Status: DC
  Administered 2012-08-23: 1000 mg via ORAL
  Filled 2012-08-22: qty 2

## 2012-08-22 MED ORDER — ACETAMINOPHEN 10 MG/ML IV SOLN
INTRAVENOUS | Status: DC | PRN
  Administered 2012-08-22: 1000 mg via INTRAVENOUS

## 2012-08-22 MED ORDER — FENTANYL CITRATE 0.05 MG/ML IJ SOLN
INTRAMUSCULAR | Status: AC
  Administered 2012-08-22: 50 ug via INTRAVENOUS
  Filled 2012-08-22: qty 2

## 2012-08-22 MED ORDER — VITAMIN D3 25 MCG (1000 UNIT) PO TABS
1000.0000 [IU] | ORAL_TABLET | Freq: Every day | ORAL | Status: DC
  Administered 2012-08-23: 1000 [IU] via ORAL
  Filled 2012-08-22: qty 1

## 2012-08-22 MED ORDER — PHENYLEPHRINE HCL 10 MG/ML IJ SOLN
INTRAMUSCULAR | Status: DC | PRN
  Administered 2012-08-22: 50 ug via INTRAVENOUS

## 2012-08-22 MED ORDER — CALCIUM CARBONATE 1250 (500 CA) MG PO TABS
1.0000 | ORAL_TABLET | Freq: Two times a day (BID) | ORAL | Status: DC
  Administered 2012-08-23: 500 mg via ORAL
  Filled 2012-08-22 (×3): qty 1

## 2012-08-22 MED ORDER — SIMVASTATIN 40 MG PO TABS
40.0000 mg | ORAL_TABLET | Freq: Every day | ORAL | Status: DC
  Administered 2012-08-23: 40 mg via ORAL
  Filled 2012-08-22 (×2): qty 1

## 2012-08-22 MED ORDER — VANCOMYCIN HCL IN DEXTROSE 1-5 GM/200ML-% IV SOLN
1000.0000 mg | INTRAVENOUS | Status: DC
  Administered 2012-08-23: 1000 mg via INTRAVENOUS
  Filled 2012-08-22: qty 200

## 2012-08-22 MED ORDER — PROPOFOL 10 MG/ML IV EMUL
INTRAVENOUS | Status: DC | PRN
  Administered 2012-08-22: 150 mL via INTRAVENOUS

## 2012-08-22 MED ORDER — LORATADINE 10 MG PO TABS
10.0000 mg | ORAL_TABLET | Freq: Every day | ORAL | Status: DC
  Administered 2012-08-23: 10 mg via ORAL
  Filled 2012-08-22: qty 1

## 2012-08-22 MED ORDER — FENTANYL CITRATE 0.05 MG/ML IJ SOLN
INTRAMUSCULAR | Status: DC | PRN
  Administered 2012-08-22: 100 ug via INTRAVENOUS

## 2012-08-22 MED ORDER — LACTATED RINGERS IV SOLN: INTRAVENOUS | Status: DC

## 2012-08-22 MED ORDER — ALPRAZOLAM 0.5 MG PO TABS: 0.5000 mg | ORAL_TABLET | Freq: Four times a day (QID) | ORAL | Status: DC | PRN

## 2012-08-22 MED ORDER — CALCIUM CARBONATE 1250 (500 CA) MG PO TABS
600.0000 mg | ORAL_TABLET | Freq: Two times a day (BID) | ORAL | Status: DC
  Filled 2012-08-22: qty 0.5

## 2012-08-22 MED ORDER — ADULT MULTIVITAMIN W/MINERALS CH
1.0000 | ORAL_TABLET | Freq: Every day | ORAL | Status: DC
  Administered 2012-08-23: 1 via ORAL
  Filled 2012-08-22: qty 1

## 2012-08-22 MED ORDER — LACTATED RINGERS IV SOLN
INTRAVENOUS | Status: DC | PRN
  Administered 2012-08-22 (×2): via INTRAVENOUS

## 2012-08-22 MED ORDER — MUPIROCIN 2 % EX OINT
TOPICAL_OINTMENT | Freq: Two times a day (BID) | CUTANEOUS | Status: DC
  Filled 2012-08-22: qty 22

## 2012-08-22 MED ORDER — ONDANSETRON HCL 4 MG/2ML IJ SOLN
INTRAMUSCULAR | Status: DC | PRN
  Administered 2012-08-22: 4 mg via INTRAVENOUS

## 2012-08-22 MED ORDER — MUPIROCIN 2 % EX OINT
TOPICAL_OINTMENT | CUTANEOUS | Status: AC
  Filled 2012-08-22: qty 22

## 2012-08-22 MED ORDER — TAPENTADOL HCL 50 MG PO TABS
100.0000 mg | ORAL_TABLET | ORAL | Status: DC | PRN
  Administered 2012-08-23 (×2): 100 mg via ORAL
  Filled 2012-08-22 (×2): qty 2

## 2012-08-22 MED ORDER — PANTOPRAZOLE SODIUM 40 MG PO TBEC
40.0000 mg | DELAYED_RELEASE_TABLET | Freq: Every day | ORAL | Status: DC
  Administered 2012-08-23: 40 mg via ORAL
  Filled 2012-08-22: qty 1

## 2012-08-22 MED ORDER — FUROSEMIDE 40 MG PO TABS
40.0000 mg | ORAL_TABLET | Freq: Every day | ORAL | Status: DC
  Administered 2012-08-23: 40 mg via ORAL
  Filled 2012-08-22 (×2): qty 1

## 2012-08-22 MED ORDER — ONDANSETRON HCL 4 MG/2ML IJ SOLN: 4.0000 mg | Freq: Four times a day (QID) | INTRAMUSCULAR | Status: DC | PRN

## 2012-08-22 MED ORDER — VENLAFAXINE HCL ER 150 MG PO CP24
150.0000 mg | ORAL_CAPSULE | Freq: Every day | ORAL | Status: DC
  Administered 2012-08-23: 150 mg via ORAL
  Filled 2012-08-22 (×2): qty 1

## 2012-08-22 MED ORDER — PHENYLEPHRINE HCL 10 MG/ML IJ SOLN
INTRAMUSCULAR | Status: DC | PRN
  Administered 2012-08-22: 80 ug via INTRAVENOUS

## 2012-08-22 MED ORDER — DEXTROSE 5 % IV SOLN
INTRAVENOUS | Status: DC | PRN
  Administered 2012-08-22: 18:00:00 via INTRAVENOUS

## 2012-08-22 MED ORDER — METHOCARBAMOL 500 MG PO TABS
500.0000 mg | ORAL_TABLET | Freq: Four times a day (QID) | ORAL | Status: DC | PRN
  Administered 2012-08-23: 500 mg via ORAL
  Filled 2012-08-22 (×2): qty 1

## 2012-08-22 MED ORDER — MIDAZOLAM HCL 5 MG/5ML IJ SOLN
INTRAMUSCULAR | Status: DC | PRN
  Administered 2012-08-22: 1 mg via INTRAVENOUS

## 2012-08-22 MED ORDER — PROMETHAZINE HCL 25 MG RE SUPP: 12.5000 mg | Freq: Four times a day (QID) | RECTAL | Status: DC | PRN

## 2012-08-22 MED ORDER — LIDOCAINE HCL (CARDIAC) 20 MG/ML IV SOLN
INTRAVENOUS | Status: DC | PRN
  Administered 2012-08-22: 80 mg via INTRAVENOUS

## 2012-08-22 MED ORDER — ATENOLOL 50 MG PO TABS
50.0000 mg | ORAL_TABLET | Freq: Every day | ORAL | Status: DC
  Administered 2012-08-23: 50 mg via ORAL
  Filled 2012-08-22 (×2): qty 1

## 2012-08-22 MED ORDER — FENTANYL CITRATE 0.05 MG/ML IJ SOLN
INTRAMUSCULAR | Status: AC
  Filled 2012-08-22: qty 2

## 2012-08-22 MED ORDER — FENTANYL CITRATE 0.05 MG/ML IJ SOLN
25.0000 ug | INTRAMUSCULAR | Status: DC | PRN
  Administered 2012-08-22 (×3): 50 ug via INTRAVENOUS

## 2012-08-22 MED ORDER — EPHEDRINE SULFATE 50 MG/ML IJ SOLN
INTRAMUSCULAR | Status: DC | PRN
  Administered 2012-08-22: 10 mg via INTRAVENOUS
  Administered 2012-08-22: 50 mg via INTRAVENOUS
  Administered 2012-08-22 (×2): 15 mg via INTRAVENOUS
  Administered 2012-08-22: 10 mg via INTRAVENOUS

## 2012-08-22 SURGICAL SUPPLY — 63 items
BANDAGE ELASTIC 4 VELCRO ST LF (GAUZE/BANDAGES/DRESSINGS) ×3 IMPLANT
BANDAGE GAUZE ELAST BULKY 4 IN (GAUZE/BANDAGES/DRESSINGS) ×2 IMPLANT
BIT DRILL 2 FAST STEP (BIT) ×1 IMPLANT
BIT DRILL 2.5X4 QC (BIT) ×1 IMPLANT
BLADE SURG ROTATE 9660 (MISCELLANEOUS) IMPLANT
BNDG CMPR 9X4 STRL LF SNTH (GAUZE/BANDAGES/DRESSINGS) ×1
BNDG ESMARK 4X9 LF (GAUZE/BANDAGES/DRESSINGS) ×2 IMPLANT
CLOTH BEACON ORANGE TIMEOUT ST (SAFETY) ×2 IMPLANT
CORDS BIPOLAR (ELECTRODE) ×2 IMPLANT
COVER SURGICAL LIGHT HANDLE (MISCELLANEOUS) ×2 IMPLANT
CUFF TOURNIQUET SINGLE 18IN (TOURNIQUET CUFF) ×2 IMPLANT
CUFF TOURNIQUET SINGLE 24IN (TOURNIQUET CUFF) IMPLANT
DECANTER SPIKE VIAL GLASS SM (MISCELLANEOUS) IMPLANT
DRAIN TLS ROUND 10FR (DRAIN) IMPLANT
DRAPE INCISE IOBAN 66X45 STRL (DRAPES) IMPLANT
DRAPE OEC MINIVIEW 54X84 (DRAPES) IMPLANT
DRAPE U-SHAPE 47X51 STRL (DRAPES) ×2 IMPLANT
ELECT REM PT RETURN 9FT ADLT (ELECTROSURGICAL) ×2
ELECTRODE REM PT RTRN 9FT ADLT (ELECTROSURGICAL) IMPLANT
GAUZE XEROFORM 1X8 LF (GAUZE/BANDAGES/DRESSINGS) ×1 IMPLANT
GLOVE ORTHO TXT STRL SZ7.5 (GLOVE) ×2 IMPLANT
GLOVE SS BIOGEL STRL SZ 8 (GLOVE) ×1 IMPLANT
GLOVE SUPERSENSE BIOGEL SZ 8 (GLOVE) ×1
GOWN PREVENTION PLUS XLARGE (GOWN DISPOSABLE) ×2 IMPLANT
GOWN STRL NON-REIN LRG LVL3 (GOWN DISPOSABLE) ×4 IMPLANT
GOWN STRL REIN XL XLG (GOWN DISPOSABLE) ×2 IMPLANT
KIT BASIN OR (CUSTOM PROCEDURE TRAY) ×2 IMPLANT
KIT ROOM TURNOVER OR (KITS) ×2 IMPLANT
LOOP VESSEL MAXI BLUE (MISCELLANEOUS) ×1 IMPLANT
MANIFOLD NEPTUNE II (INSTRUMENTS) ×1 IMPLANT
NDL BLUNT 16X1.5 OR ONLY (NEEDLE) IMPLANT
NEEDLE 22X1 1/2 (OR ONLY) (NEEDLE) IMPLANT
NEEDLE BLUNT 16X1.5 OR ONLY (NEEDLE) IMPLANT
NS IRRIG 1000ML POUR BTL (IV SOLUTION) ×2 IMPLANT
PACK ORTHO EXTREMITY (CUSTOM PROCEDURE TRAY) ×2 IMPLANT
PAD ARMBOARD 7.5X6 YLW CONV (MISCELLANEOUS) ×3 IMPLANT
PAD CAST 4YDX4 CTTN HI CHSV (CAST SUPPLIES) ×1 IMPLANT
PADDING CAST COTTON 4X4 STRL (CAST SUPPLIES) ×2
PEG SUBCHONDRAL SMOOTH 2.0X18 (Peg) ×2 IMPLANT
PEG SUBCHONDRAL SMOOTH 2.0X20 (Peg) ×3 IMPLANT
PEG SUBCHONDRAL SMOOTH 2.0X22 (Peg) ×1 IMPLANT
PLATE STAN 24.4X59.5 LT (Plate) ×1 IMPLANT
SAFETY SPLINT ×1 IMPLANT
SCREW BN 12X3.5XNS CORT TI (Screw) IMPLANT
SCREW BN 13X3.5XNS CORT TI (Screw) IMPLANT
SCREW CORT 3.5X10 LNG (Screw) ×1 IMPLANT
SCREW CORT 3.5X12 (Screw) ×4 IMPLANT
SCREW CORT 3.5X13 (Screw) ×2 IMPLANT
SPONGE GAUZE 4X4 12PLY (GAUZE/BANDAGES/DRESSINGS) ×3 IMPLANT
SPONGE LAP 4X18 X RAY DECT (DISPOSABLE) ×2 IMPLANT
STAPLER VISISTAT 35W (STAPLE) ×1 IMPLANT
SUCTION FRAZIER TIP 10 FR DISP (SUCTIONS) ×2 IMPLANT
SUT ETHILON 4 0 PS 2 18 (SUTURE) ×1 IMPLANT
SUT PROLENE 4 0 PS 2 18 (SUTURE) ×2 IMPLANT
SUT VIC AB 3-0 FS2 27 (SUTURE) ×2 IMPLANT
SYR CONTROL 10ML LL (SYRINGE) IMPLANT
SYSTEM CHEST DRAIN TLS 7FR (DRAIN) ×1 IMPLANT
TOWEL OR 17X24 6PK STRL BLUE (TOWEL DISPOSABLE) ×2 IMPLANT
TOWEL OR 17X26 10 PK STRL BLUE (TOWEL DISPOSABLE) ×2 IMPLANT
TUBE CONNECTING 12X1/4 (SUCTIONS) ×2 IMPLANT
TUBE EVACUATION TLS (MISCELLANEOUS) ×1 IMPLANT
WATER STERILE IRR 1000ML POUR (IV SOLUTION) ×1 IMPLANT
YANKAUER SUCT BULB TIP NO VENT (SUCTIONS) IMPLANT

## 2012-08-22 NOTE — Preoperative (Signed)
Beta Blockers   Took Atenolol @ 8:25 this am

## 2012-08-22 NOTE — Progress Notes (Signed)
ANTIBIOTIC CONSULT NOTE - INITIAL  Pharmacy Consult for vancomycin  Indication: surgical prophylaxis  Allergies  Allergen Reactions  . Codeine     REACTION: Nausea  . Penicillins     REACTION: Rash  . Sulfonamide Derivatives     REACTION: Nausea  . Percocet (Oxycodone-Acetaminophen) Itching    Patient Measurements: Height: 5\' 4"  (162.6 cm) Weight: 145 lb (65.772 kg) IBW/kg (Calculated) : 54.7    Vital Signs: Temp: 97.9 F (36.6 C) (09/24 2213) Temp src: Oral (09/24 1552) BP: 100/50 mmHg (09/24 2213) Pulse Rate: 73  (09/24 2213) Intake/Output from previous day:   Intake/Output from this shift: Total I/O In: 200 [I.V.:150; IV Piggyback:50] Out: 25 [Blood:25]  Labs:  [facility] 08/22/12 1555  WBC 11.4*  HGB 11.7*  PLT 318  LABCREA --  CREATININE 0.91   Estimated Creatinine Clearance: 53.7 ml/min (by C-G formula based on Cr of 0.91). No results found for this basename: VANCOTROUGH:2,VANCOPEAK:2,VANCORANDOM:2,GENTTROUGH:2,GENTPEAK:2,GENTRANDOM:2,TOBRATROUGH:2,TOBRAPEAK:2,TOBRARND:2,AMIKACINPEAK:2,AMIKACINTROU:2,AMIKACIN:2, in the last 72 hours   Microbiology: Recent Results (from the past 720 hour(s))  SURGICAL PCR SCREEN     Status: Normal   Collection Time   08/22/12  3:26 PM      Component Value Range Status Comment   MRSA, PCR NEGATIVE  NEGATIVE Final    Staphylococcus aureus NEGATIVE  NEGATIVE Final     Medical History: Past Medical History  Diagnosis Date  . Hyperlipidemia   . Hypertension   . Mild obesity   . Spontaneous pneumothorax     1968  . GERD (gastroesophageal reflux disease)   . Osteoarthritis   . Low back pain   . Osteoporosis   . Hormone replacement therapy (postmenopausal)   . Anxiety   . Osteopenia   . COPD (chronic obstructive pulmonary disease)   . Diverticulosis of colon   . Shortness of breath     with exertion   . Anemia     hx of   . Chronic insomnia 04/22/2012  . Scoliosis   . Pneumonia     hx of 5 years ago   .  Migraine headache     denies   Assessment: 71 year old female s/p ORIF left distal radius fracture. Orders for vancomycin for post-op prophylaxis given pcn allergy. Scr within normal limits at 0.9.  Goal of Therapy:  Vancomycin trough level 10-15 mcg/ml  Plan:  Vancomycin 1g iv q 24h Follow LOT Severiano Gilbert 08/22/2012,10:16 PM

## 2012-08-22 NOTE — Op Note (Signed)
See full dictation #[id number] Dominica Severin MD

## 2012-08-22 NOTE — Anesthesia Preprocedure Evaluation (Addendum)
Anesthesia Evaluation  Patient identified by MRN, date of birth, ID band Patient awake    Reviewed: Allergy & Precautions, H&P , NPO status , Patient's Chart, lab work & pertinent test results, reviewed documented beta blocker date and time   Airway Mallampati: II TM Distance: >3 FB     Dental  (+) Edentulous Upper   Pulmonary neg shortness of breath, neg pneumonia -, COPD         Cardiovascular Exercise Tolerance: Good hypertension, Pt. on medications and Pt. on home beta blockers Rhythm:Regular Rate:Normal  11:31:40 [facility] PRE ROUTINE RECORD Normal sinus rhythm with sinus arrhythmia Non-specific ST-t changes Normal ECG No significant change since last tracing   Neuro/Psych Anxiety    GI/Hepatic GERD-  Medicated and Controlled,  Endo/Other  diabetes, Well Controlled, Type 2  Renal/GU      Musculoskeletal  (+) Arthritis -, Osteoarthritis,    Abdominal   Peds  Hematology   Anesthesia Other Findings   Reproductive/Obstetrics                        Anesthesia Physical Anesthesia Plan  ASA: III  Anesthesia Plan: General   Post-op Pain Management:    Induction: Intravenous  Airway Management Planned: Oral ETT  Additional Equipment:   Intra-op Plan:   Post-operative Plan: Possible Post-op intubation/ventilation  Informed Consent:   Plan Discussed with: Anesthesiologist, CRNA and Surgeon  Anesthesia Plan Comments:         Anesthesia Quick Evaluation

## 2012-08-22 NOTE — H&P (Signed)
Tara Neal is an 71 y.o. female.   Chief Complaint: Fx Left wrist HPI: Marland KitchenMarland KitchenPatient presents for evaluation and treatment of the of their upper extremity predicament. The patient denies neck back chest or of abdominal pain. The patient notes that they have no lower extremity problems. The patient from primarily complains of the upper extremity pain noted.  Past Medical History  Diagnosis Date  . Hyperlipidemia   . Hypertension   . Mild obesity   . Spontaneous pneumothorax     1968  . GERD (gastroesophageal reflux disease)   . Osteoarthritis   . Low back pain   . Osteoporosis   . Hormone replacement therapy (postmenopausal)   . Anxiety   . Osteopenia   . COPD (chronic obstructive pulmonary disease)   . Diverticulosis of colon   . Shortness of breath     with exertion   . Anemia     hx of   . Chronic insomnia 04/22/2012  . Scoliosis   . Pneumonia     hx of 5 years ago   . Migraine headache     denies    Past Surgical History  Procedure Date  . Tonsillectomy   . Lumbar disc surgery      x 2  . Other surgical history     benign growth removed from right under ear at age 47   . Excision/release bursa hip 04/26/2012    Procedure: EXCISION/RELEASE BURSA HIP;  Surgeon: Loanne [name], MD;  Location: [facility];  Service: Orthopedics;  Laterality: Right;  . Hip adductor tenotomy   . Eye surgery     - bil and Retina surgery to left eye  . Tonsillectomy     Family History  Problem Relation Age of Onset  . Hyperlipidemia Mother   . Heart disease Mother   . Diabetes Mother    Social History:  reports that she quit smoking about 30 years ago. Her smoking use included Cigarettes. She quit after 20 years of use. She has never used smokeless tobacco. She reports that she drinks about 1.8 ounces of alcohol per week. She reports that she does not use illicit drugs.  Allergies:  Allergies  Allergen Reactions  . Codeine     REACTION: Nausea  . Penicillins     REACTION: Rash  .  Sulfonamide Derivatives     REACTION: Nausea  . Percocet (Oxycodone-Acetaminophen) Itching    Medications Prior to Admission  Medication Sig Dispense Refill  . alendronate (FOSAMAX) 70 MG tablet Take 70 mg by mouth every 7 (seven) days. On Saturdays. Take with a full glass of water on an empty stomach.      Marland Kitchen atenolol (TENORMIN) 50 MG tablet Take 50 mg by mouth daily with breakfast.      . benazepril (LOTENSIN) 40 MG tablet Take 40 mg by mouth daily with breakfast.      . calcium carbonate (OS-CAL) 600 MG TABS Take 600 mg by mouth 2 (two) times daily with a meal.      . cetirizine (ZYRTEC) 10 MG tablet Take 10 mg by mouth daily with breakfast.       . Cholecalciferol (VITAMIN D3) 1000 UNITS CAPS Take 1 capsule by mouth daily.      . furosemide (LASIX) 40 MG tablet Take 40 mg by mouth daily with breakfast.      . HYDROcodone-acetaminophen (NORCO) 5-325 MG per tablet Take 1 tablet by mouth every 6 (six) hours as needed. 1 tab by mouth  every 6 hrs as needed for pain      . meloxicam (MOBIC) 15 MG tablet Take 15 mg by mouth daily with breakfast.      . methocarbamol (ROBAXIN) 500 MG tablet Take 500 mg by mouth 4 (four) times daily.      . Multiple Vitamin (MULTIVITAMIN) tablet Take 1 tablet by mouth daily.      Marland Kitchen omeprazole (PRILOSEC) 20 MG capsule Take 20 mg by mouth daily.       . simvastatin (ZOCOR) 40 MG tablet Take 40 mg by mouth daily with breakfast.      . tapentadol (NUCYNTA) 50 MG TABS Take 50-100 mg by mouth every 4 (four) hours as needed. For pain      . venlafaxine XR (EFFEXOR-XR) 150 MG 24 hr capsule Take 150 mg by mouth daily with breakfast.        Results for orders placed during the hospital encounter of 08/22/12 (from the past 48 hour(s))  APTT     Status: Normal   Collection Time   08/22/12  3:55 PM      Component Value Range Comment   aPTT 33  24 - 37 seconds   CBC WITH DIFFERENTIAL     Status: Abnormal   Collection Time   08/22/12  3:55 PM      Component Value Range  Comment   WBC 11.4 (*) 4.0 - 10.5 K/uL    RBC 3.72 (*) 3.87 - 5.11 MIL/uL    Hemoglobin 11.7 (*) 12.0 - 15.0 g/dL    HCT 29.5 (*) 28.4 - 46.0 %    MCV 94.4  78.0 - 100.0 fL    MCH 31.5  26.0 - 34.0 pg    MCHC 33.3  30.0 - 36.0 g/dL    RDW 13.2  44.0 - 10.2 %    Platelets 318  150 - 400 K/uL    Neutrophils Relative 47  43 - 77 %    Neutro Abs 5.4  1.7 - 7.7 K/uL    Lymphocytes Relative 44  12 - 46 %    Lymphs Abs 5.0 (*) 0.7 - 4.0 K/uL    Monocytes Relative 7  3 - 12 %    Monocytes Absolute 0.8  0.1 - 1.0 K/uL    Eosinophils Relative 2  0 - 5 %    Eosinophils Absolute 0.2  0.0 - 0.7 K/uL    Basophils Relative 0  0 - 1 %    Basophils Absolute 0.0  0.0 - 0.1 K/uL   COMPREHENSIVE METABOLIC PANEL     Status: Abnormal   Collection Time   08/22/12  3:55 PM      Component Value Range Comment   Sodium 135  135 - 145 mEq/L    Potassium 3.9  3.5 - 5.1 mEq/L    Chloride 99  96 - 112 mEq/L    CO2 26  19 - 32 mEq/L    Glucose, Bld 92  70 - 99 mg/dL    BUN 19  6 - 23 mg/dL    Creatinine, Ser 7.25  0.50 - 1.10 mg/dL    Calcium 9.1  8.4 - 36.6 mg/dL    Total Protein 6.6  6.0 - 8.3 g/dL    Albumin 3.7  3.5 - 5.2 g/dL    AST 17  0 - 37 U/L    ALT 7  0 - 35 U/L    Alkaline Phosphatase 74  39 - 117 U/L    Total  Bilirubin 0.5  0.3 - 1.2 mg/dL    GFR calc non Af Amer 62 (*) >90 mL/min    GFR calc Af Amer 72 (*) >90 mL/min   PROTIME-INR     Status: Normal   Collection Time   08/22/12  3:55 PM      Component Value Range Comment   Prothrombin Time 13.3  11.6 - 15.2 seconds    INR 1.02  0.00 - 1.49    No results found.  Review of Systems  Constitutional: Negative.   Eyes: Negative.   Respiratory: Negative.   Cardiovascular: Negative.   Gastrointestinal: Negative.   Genitourinary: Negative.   Neurological: Negative.   Endo/Heme/Allergies: Negative.     Blood pressure 105/68, pulse 63, temperature 97.7 F (36.5 C), temperature source Oral, resp. rate 18, height 5\' 4"  (1.626 m), weight  65.772 kg (145 lb), SpO2 93.00%. Physical Exam ..The patient is alert and oriented in no acute distress the patient complains of pain in the affected upper extremity. The patient is noted to have a normal HEENT exam. Lung fields show equal chest expansion and no shortness of breath abdomen exam is nontender without distention. Lower extremity examination does not show any fracture dislocation or blood clot symptoms. Pelvis is stable neck and back are stable and nontender  Left wrist fx closed NVI Lt hand  Assessment/Plan Left distal radius fracture DIABETES MELLITUS, TYPE II HYPERLIPIDEMIA OBESITY, MILD ANXIETY MIGRAINE HEADACHE HYPERTENSION CHRONIC VENOUS HYPERTENSION WITHOUT COMPS COPD Pneumothorax GERD OSTEOARTHRITIS LOW BACK PAIN OSTEOPOROSIS FATIGUE DIVERTICULOSIS, COLON ARTERIOVENOUS MALFORMATION, COLON Preventative health care Right hip pain Right knee pain Chronic insomnia Trochanteric bursitis of right hip  .Marland KitchenWe are planning surgery for your upper extremity. The risk and benefits of surgery include risk of bleeding infection anesthesia damage to normal structures and failure of the surgery to accomplish its intended goals of relieving symptoms and restoring function with this in mind we'll going to proceed. I have specifically discussed with the patient the pre-and postoperative regime and the does and don'ts and risk and benefits in great detail. Risk and benefits of surgery also include risk of dystrophy chronic nerve pain failure of the healing process to go onto completion and other inherent risks of surgery The relavent the pathophysiology of the disease/injury process, as well as the alternatives for treatment and postoperative course of action has been discussed in great detail with the patient who desires to proceed.  We will do everything in our power to help you (the patient) restore function to the upper extremity. Is a pleasure to see this patient today.   Karen Chafe 08/22/2012, 5:46 PM

## 2012-08-22 NOTE — Transfer of Care (Signed)
Immediate Anesthesia Transfer of Care Note  Patient: Tara Neal  Procedure(s) Performed: Procedure(s) (LRB) with comments: OPEN REDUCTION INTERNAL FIXATION (ORIF) DISTAL RADIAL FRACTURE (Left) - OPEN REDUCTION INTERNAL FIXATION LEFT DISTAL RADIUS FRACURE WITH ALLOGRAFT AND AUTOGRAFT BONE GRAFT  REPAIR AND RECONSTRUCTION AS NECESSARY   Patient Location: PACU  Anesthesia Type: General and GA combined with regional for post-op pain  Level of Consciousness: sedated and patient cooperative  Airway & Oxygen Therapy: Patient Spontanous Breathing and Patient connected to nasal cannula oxygen  Post-op Assessment: Report given to PACU RN and Post -op Vital signs reviewed and stable  Post vital signs: Reviewed and stable  Complications: No apparent anesthesia complications

## 2012-08-22 NOTE — Anesthesia Postprocedure Evaluation (Signed)
  Anesthesia Post-op Note  Patient: Tara Neal  Procedure(s) Performed: Procedure(s) (LRB) with comments: OPEN REDUCTION INTERNAL FIXATION (ORIF) DISTAL RADIAL FRACTURE (Left) - OPEN REDUCTION INTERNAL FIXATION LEFT DISTAL RADIUS FRACURE WITH ALLOGRAFT AND AUTOGRAFT BONE GRAFT  REPAIR AND RECONSTRUCTION AS NECESSARY   Patient Location: PACU  Anesthesia Type: General  Level of Consciousness: awake, alert  and oriented  Airway and Oxygen Therapy: Patient Spontanous Breathing and Patient connected to nasal cannula oxygen  Post-op Pain: mild  Post-op Assessment: Post-op Vital signs reviewed  Post-op Vital Signs: Reviewed  Complications: No apparent anesthesia complications

## 2012-08-23 LAB — GLUCOSE, CAPILLARY

## 2012-08-23 MED ORDER — HYDROCODONE-ACETAMINOPHEN 5-325 MG PO TABS: 1.0000 | ORAL_TABLET | ORAL | Status: DC | PRN

## 2012-08-23 MED ORDER — HYDROCODONE-ACETAMINOPHEN 5-325 MG PO TABS
1.0000 | ORAL_TABLET | Freq: Four times a day (QID) | ORAL | Status: DC | PRN
  Administered 2012-08-23: 1 via ORAL
  Filled 2012-08-23: qty 1

## 2012-08-23 MED FILL — Mupirocin Oint 2%: CUTANEOUS | Qty: 22 | Status: AC

## 2012-08-23 NOTE — Op Note (Signed)
NAMELANDRY, Tara Neal                  ACCOUNT NO.:  [id number]  MEDICAL RECORD NO.:  [id number]  LOCATION:  6N26C                        FACILITY:  [facility]  PHYSICIAN:  Dionne Ano. [place] Current, M.D.DATE OF BIRTH:  Jul 29, 1941  DATE OF PROCEDURE: DATE OF DISCHARGE:                              OPERATIVE REPORT   PREOPERATIVE DIAGNOSIS:  Comminuted left distal radius fracture.  POSTOPERATIVE DIAGNOSIS:  Comminuted left distal radius fracture.  PROCEDURE: 1. Open reduction and internal fixation of left distal radius     fracture. 2. Sliding brachioradialis tenotomy. 3. Stress radiography.  SURGEON:  Dionne Ano. Amanda Pea, MD  ASSISTANT:  None.  COMPLICATION:  None.  ANESTHESIA:  General.  INDICATIONS:  A 71 year old female with displaced distal radius fracture.  She desires to proceed with surgical intervention and try to restore her anatomic integrity and functional characteristics of the wrist.  Options and operation was discussed at length, she desires to proceed.  OPERATION IN DETAIL:  The patient was seen by myself and anesthesia, taken to the operating suite, underwent smooth induction of general anesthesia.  She was laid supine, appropriately padded, and prepped and draped in usual sterile fashion with Betadine scrub and paint about the left upper extremity.  Once this was complete, we then called final time- out, secured the pre and postop checklist and made sure that she was sterilely draped.  At this juncture, I performed a volar radial incision.  Dissection was carried down and FCR tendon sheath was incised palmarly and dorsally.  Carpal canal contents were retracted ulnarly. Following this, pronator was incised, access to the fracture occurred. I then placed the DVR 4-hole plate.  I was able to restore the radial inclination, volar tilt, and radial height without difficulty.  The patient tolerated this well.  There were no complicating features. Standard AO technique was used  for insertion of the screws and the patient did have a sliding brachioradialis tenotomy performed during the surgery to prevent deforming forces off the styloid.  The patient tolerated this quite well.  There were no complicating features.  Thus, ORIF of the distal radius with DVR fracture system from [employer] was accomplished.  Excellent height, volar tilt and inclination were restored and I was pleased with this.  Following this, the area was irrigated, pronator was closed, intraoperative fluoroscopy demonstrated excellent range of motion and stability and I was quite pleased.  At this juncture, we then placed a TLS drain and sutured the wound with 1 Vicryl in the subcu followed by Prolene stitches.  The patient did have a stout lateral antebrachial cutaneous nerve branch, which was injured with the fracture.  I would suspect she would have some peri-incisional numbness postoperatively.  The median nerve was intact.  The patient will be monitored in the recovery room, admitted overnight for observation, antibiotics and general postop measures.  Should any problems occur, she will notify us.  She was placed in a short-arm splint.  Her distal radioulnar joint looked excellent and she had excellent mechanics and instability about the DRUJ on stress testing.  It has been absolutely pleasure to see her today and participating in her care.  We look forward to participate  in her postop recovery.     Dionne Ano. Amanda Pea, M.D.     [facility]  D:  08/22/2012  T:  08/23/2012  Job:  [id number]

## 2012-08-23 NOTE — Evaluation (Signed)
Occupational Therapy Evaluation Patient Details Name: Tara Neal MRN: [id number] DOB: [birth year] Today's Date: 08/23/2012 Time: 1140-1200 OT Time Calculation (min): 20 min  OT Assessment / Plan / Recommendation Clinical Impression  Pt presents to OT s/p wrist surgery. Pt educated in digit AROM and edema control. Pt will benefit from skilled OT at OP as MD indicates. Education complete regarding digit ROM, as well as edema control post surgery    OT Assessment  All further OT needs can be met in the next venue of care    Follow Up Recommendations  Outpatient OT;Other (comment) (as Md indicates)                Precautions / Restrictions Precautions Precaution Comments: NWB LUE Restrictions Other Position/Activity Restrictions: cast on L wrist       ADL  Transfers/Ambulation Related to ADLs: OT order for LUE finger ROM and edema control.  OT eval and educationfocused on L finger AROM including flexion, extension, and opposition, as well as keeping LUe elevevated on 3 pillows to prevent edema while sitting  or in bed. Education completed regarding digit ROM and elevation of LUE. ADL Comments: husband will help pt as needed with ADL activity    OT Diagnosis: Generalized weakness  OT Problem List: Decreased strength;Decreased range of motion   OT Goals    Visit Information  Last OT Received On: 08/23/12    Subjective Data  Subjective: how will i shower?  OT instructed pt to obtain a water proof cast sleeve from medical supply store.   Prior Functioning     Home Living Lives With: Spouse Available Help at Discharge: Family Type of Home: House            Cognition  Overall Cognitive Status: Appears within functional limits for tasks assessed/performed Arousal/Alertness: Awake/alert Orientation Level: Appears intact for tasks assessed Behavior During Session: [facility] for tasks performed    Extremity/Trunk Assessment Right Upper Extremity Assessment RUE  ROM/Strength/Tone: [facility] for tasks assessed Left Upper Extremity Assessment LUE ROM/Strength/Tone: Unable to fully assess LUE ROM/Strength/Tone Deficits: due to surgery                 End of Session OT - End of Session Activity Tolerance: Patient tolerated treatment well Patient left: in bed  GO     Hernan Turnage, Metro Kung 08/23/2012, 12:05 PM

## 2012-08-23 NOTE — Discharge Summary (Signed)
Physician Discharge Summary  Patient ID: Tara Neal MRN: [id number] DOB/AGE: 70/18/1942 70 y.o.  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 COMPS COPD Pneumothorax GERD OSTEOARTHRITIS LOW BACK PAIN OSTEOPOROSIS FATIGUE DIVERTICULOSIS, COLON ARTERIOVENOUS MALFORMATION, COLON Preventative health care Right hip pain Right knee pain Chronic insomnia Trochanteric bursitis of right hip  Discharge Diagnoses: S/P Orif left distal radius fracture DIABETES MELLITUS, TYPE II HYPERLIPIDEMIA OBESITY, MILD ANXIETY MIGRAINE HEADACHE HYPERTENSION CHRONIC VENOUS HYPERTENSION WITHOUT COMPS COPD Pneumothorax GERD OSTEOARTHRITIS LOW BACK PAIN OSTEOPOROSIS FATIGUE DIVERTICULOSIS, COLON ARTERIOVENOUS MALFORMATION, COLON Preventative health care Right hip pain Right knee pain Chronic insomnia Trochanteric bursitis of right hip   Discharged Condition: Improved Hospital Course: The patient is a very pleasant 71 year old female who is admitted 9/ 24/ 2013 after sustaining a left displaced distal radius fracture. The patient was seen initially in the office setting and was noted to have a displaced left distal radius fracture. We discussed with the patient given the degree of displacement and propensity for continued progressive collapse from arthritis and stiffness as well as chronic pain at recommendation for surgical intervention. Decision was made proceed with surgical endeavors. The patient was admitted 08/22/2012, and underwent open reduction internal fixation of her left distal radius fracture. She tolerated procedure very well there were no interoperative complications present, please see operative report for full details.  The patient was admitted for pain control, IV antibiotics, elevation, and close observation.  Postoperative day #1 patient is doing very well she was tolerating a regular diet without difficulties and voiding without difficulties. Her pain was well controlled on hydrocodone, she does have a codeine allergy but tolerates hydrocodone very well and without difficulties. She denied shortness of breath, fevers,she was noted to be moving the digits quite well and had no complicating features at that juncture. The decision was made to discharge her home to the care of her husband. We discussed with she and her husband all discharge instructions at  Consults: None  Treatments: Open reduction and internal fixation left distal radius fracture  Discharge Exam: Blood pressure 116/59, pulse 69, temperature 97.9 F (36.6 C), temperature source Oral, resp. rate 18, height 5\' 4"  (1.626 m), weight 65.772 kg (145 lb), SpO2 95.00%. Marland Kitchen.The patient is alert and oriented in no acute distress the patient complains of pain in the affected upper extremity. The patient is noted to have a normal HEENT exam. Lung fields show equal chest expansion and no shortness of breath abdomen exam is nontender without distention. Lower extremity examination does not show any fracture dislocation or blood clot symptoms. Pelvis is stable neck and back are stable and nontender  examination of the left upper extremity shows that her splint was clean and intact, she had excellent digital range of motion, neurovascularly she was intact, no signs of infection are present. I should note her drain was previously removed.  Disposition: 01-Home or Self Care  Discharge Orders    Future Orders Please Complete By Expires   Diet - low sodium heart healthy      Call MD / Call 911      Comments:   If you experience chest pain or shortness of breath, CALL 911 and be transported to the hospital emergency room.  If you develope a fever above 101 F, pus (white drainage) or increased drainage or redness at the wound, or calf pain, call  your surgeon's  office.   Constipation Prevention      Comments:   Drink plenty of fluids.  Prune juice may be helpful.  You may use a stool softener, such as Colace (over the counter) 100 mg twice a day.  Use MiraLax (over the counter) for constipation as needed.   Increase activity slowly as tolerated      Discharge instructions      Comments:   Marland KitchenMarland KitchenKeep bandage clean and dry.  Call for any problems.  No smoking.  Criteria for driving a car: you should be off your pain medicine for 7-8 hours, able to drive one handed(confident), thinking clearly and feeling able in your judgement to drive. Continue elevation as it will decrease swelling.  If instructed by MD move your fingers within the confines of the bandage/splint.  Use ice if instructed by your MD. Call immediately for any sudden loss of feeling in your hand/arm or change in functional abilities of the extremity. Marland Kitchen.We recommend that you to take vitamin C 1000 mg a day to promote healing we also recommend that if you require her pain medicine that he take a stool softener to prevent constipation as most pain medicines will have constipation side effects. We recommend either Peri-Colace or Senokot and recommend that you also consider adding MiraLAX to prevent the constipation affects from pain medicine if you are required to use them. These medicines are over the counter and maybe purchased at a local pharmacy.       Medication List     As of 08/23/2012 12:55 PM    STOP taking these medications         tapentadol 50 MG Tabs   Commonly known as: NUCYNTA      TAKE these medications         alendronate 70 MG tablet   Commonly known as: FOSAMAX   Take 70 mg by mouth every 7 (seven) days. On Saturdays. Take with a full glass of water on an empty stomach.      atenolol 50 MG tablet   Commonly known as: TENORMIN   Take 50 mg by mouth daily with breakfast.      benazepril 40 MG tablet   Commonly known as: LOTENSIN   Take 40 mg by mouth daily with  breakfast.      calcium carbonate 600 MG Tabs   Commonly known as: OS-CAL   Take 600 mg by mouth 2 (two) times daily with a meal.      cetirizine 10 MG tablet   Commonly known as: ZYRTEC   Take 10 mg by mouth daily with breakfast.      furosemide 40 MG tablet   Commonly known as: LASIX   Take 40 mg by mouth daily with breakfast.      HYDROcodone-acetaminophen 5-325 MG per tablet   Commonly known as: NORCO/VICODIN   Take 1 tablet by mouth every 6 (six) hours as needed. 1 tab by mouth every 6 hrs as needed for pain      HYDROcodone-acetaminophen 5-325 MG per tablet   Commonly known as: NORCO/VICODIN   Take 1 tablet by mouth every 4 (four) hours as needed.      meloxicam 15 MG tablet   Commonly known as: MOBIC   Take 15 mg by mouth daily with breakfast.      methocarbamol 500 MG tablet   Commonly known as: ROBAXIN   Take 500 mg by mouth 4 (four) times daily.  multivitamin tablet   Take 1 tablet by mouth daily.      omeprazole 20 MG capsule   Commonly known as: PRILOSEC   Take 20 mg by mouth daily.      simvastatin 40 MG tablet   Commonly known as: ZOCOR   Take 40 mg by mouth daily with breakfast.      venlafaxine XR 150 MG 24 hr capsule   Commonly known as: EFFEXOR-XR   Take 150 mg by mouth daily with breakfast.      Vitamin D3 1000 UNITS Caps   Take 1 capsule by mouth daily.           Follow-up Information    Follow up with Karen Chafe, MD. Schedule an appointment as soon as possible for a visit in 10 days. (call for any questions or concerns)    Contact information:   [address] [place] [address] [place] [address] [phone]          Signed: Sheran Lawless 08/23/2012, 12:55 PM

## 2012-08-24 NOTE — Progress Notes (Signed)
Discharge instructions reviewed with patient and husband. Verbalizes understanding. Printed AVS and prescriptions given to patient. Discharged home.

## 2012-08-24 NOTE — Op Note (Signed)
NAMEELLEEN, COULIBALY                  ACCOUNT NO.:  [id number]  MEDICAL RECORD NO.:  [id number]  LOCATION:  6N26C                        FACILITY:  [facility]  PHYSICIAN:  Dionne Ano. Markie Frith, M.D.DATE OF BIRTH:  08/05/1941  DATE OF PROCEDURE: DATE OF DISCHARGE:  08/23/2012                              OPERATIVE REPORT   ADDENDUM:  Ms. Montelongo fracture was greater than 3-part comminuted complex intra-articular fracture.  This was not noted in the dictation, but should be noted for purposes of description of the fracture.  Once again, she had a DVR plate and screw construct placed about a greater than 3-part intra-articular distal radius fracture.  She had intra- articular extension into the area in question.     Dionne Ano. Amanda Pea, M.D.     [facility]  D:  08/24/2012  T:  08/24/2012  Job:  [id number]

## 2012-08-31 DIAGNOSIS — S52599A Other fractures of lower end of unspecified radius, initial encounter for closed fracture: Secondary | ICD-10-CM | POA: Diagnosis not present

## 2012-09-05 DIAGNOSIS — M76899 Other specified enthesopathies of unspecified lower limb, excluding foot: Secondary | ICD-10-CM | POA: Diagnosis not present

## 2012-09-06 ENCOUNTER — Other Ambulatory Visit: Payer: Self-pay | Admitting: Internal Medicine

## 2012-09-06 DIAGNOSIS — Z1231 Encounter for screening mammogram for malignant neoplasm of breast: Secondary | ICD-10-CM

## 2012-09-21 ENCOUNTER — Other Ambulatory Visit: Payer: Self-pay

## 2012-09-21 DIAGNOSIS — S52599A Other fractures of lower end of unspecified radius, initial encounter for closed fracture: Secondary | ICD-10-CM | POA: Diagnosis not present

## 2012-09-21 DIAGNOSIS — M76899 Other specified enthesopathies of unspecified lower limb, excluding foot: Secondary | ICD-10-CM | POA: Diagnosis not present

## 2012-09-21 MED ORDER — ALENDRONATE SODIUM 70 MG PO TABS: 70.0000 mg | ORAL_TABLET | ORAL | Status: DC

## 2012-09-28 DIAGNOSIS — S52599A Other fractures of lower end of unspecified radius, initial encounter for closed fracture: Secondary | ICD-10-CM | POA: Diagnosis not present

## 2012-10-04 DIAGNOSIS — S52599A Other fractures of lower end of unspecified radius, initial encounter for closed fracture: Secondary | ICD-10-CM | POA: Diagnosis not present

## 2012-10-12 DIAGNOSIS — S52599A Other fractures of lower end of unspecified radius, initial encounter for closed fracture: Secondary | ICD-10-CM | POA: Diagnosis not present

## 2012-10-16 ENCOUNTER — Ambulatory Visit
Admission: RE | Admit: 2012-10-16 | Discharge: 2012-10-16 | Disposition: A | Discharge status: Home / Self Care | Payer: Medicare Other | Source: Ambulatory Visit | Attending: Internal Medicine | Admitting: Internal Medicine

## 2012-10-16 DIAGNOSIS — Z1231 Encounter for screening mammogram for malignant neoplasm of breast: Secondary | ICD-10-CM

## 2012-10-19 DIAGNOSIS — S52599A Other fractures of lower end of unspecified radius, initial encounter for closed fracture: Secondary | ICD-10-CM | POA: Diagnosis not present

## 2012-10-19 DIAGNOSIS — Z4789 Encounter for other orthopedic aftercare: Secondary | ICD-10-CM | POA: Diagnosis not present

## 2012-11-02 DIAGNOSIS — S52599A Other fractures of lower end of unspecified radius, initial encounter for closed fracture: Secondary | ICD-10-CM | POA: Diagnosis not present

## 2012-11-09 DIAGNOSIS — S52599A Other fractures of lower end of unspecified radius, initial encounter for closed fracture: Secondary | ICD-10-CM | POA: Diagnosis not present

## 2012-11-16 DIAGNOSIS — S52599A Other fractures of lower end of unspecified radius, initial encounter for closed fracture: Secondary | ICD-10-CM | POA: Diagnosis not present

## 2012-11-20 DIAGNOSIS — M25519 Pain in unspecified shoulder: Secondary | ICD-10-CM | POA: Diagnosis not present

## 2012-11-20 DIAGNOSIS — S52599A Other fractures of lower end of unspecified radius, initial encounter for closed fracture: Secondary | ICD-10-CM | POA: Diagnosis not present

## 2012-11-25 DIAGNOSIS — M25519 Pain in unspecified shoulder: Secondary | ICD-10-CM | POA: Diagnosis not present

## 2012-12-04 DIAGNOSIS — S52599A Other fractures of lower end of unspecified radius, initial encounter for closed fracture: Secondary | ICD-10-CM | POA: Diagnosis not present

## 2012-12-20 DIAGNOSIS — H52209 Unspecified astigmatism, unspecified eye: Secondary | ICD-10-CM | POA: Diagnosis not present

## 2012-12-20 DIAGNOSIS — H5315 Visual distortions of shape and size: Secondary | ICD-10-CM | POA: Diagnosis not present

## 2012-12-20 DIAGNOSIS — H40019 Open angle with borderline findings, low risk, unspecified eye: Secondary | ICD-10-CM | POA: Diagnosis not present

## 2012-12-20 DIAGNOSIS — Z961 Presence of intraocular lens: Secondary | ICD-10-CM | POA: Diagnosis not present

## 2012-12-21 ENCOUNTER — Encounter: Payer: Self-pay | Admitting: Internal Medicine

## 2012-12-21 ENCOUNTER — Ambulatory Visit (INDEPENDENT_AMBULATORY_CARE_PROVIDER_SITE_OTHER)
Admission: RE | Admit: 2012-12-21 | Discharge: 2012-12-21 | Disposition: A | Discharge status: Home / Self Care | Payer: Medicare Other | Source: Ambulatory Visit | Attending: Internal Medicine | Admitting: Internal Medicine

## 2012-12-21 ENCOUNTER — Other Ambulatory Visit (INDEPENDENT_AMBULATORY_CARE_PROVIDER_SITE_OTHER): Payer: Medicare Other

## 2012-12-21 ENCOUNTER — Ambulatory Visit (INDEPENDENT_AMBULATORY_CARE_PROVIDER_SITE_OTHER): Payer: Medicare Other | Admitting: Internal Medicine

## 2012-12-21 VITALS — BP 134/88 | HR 65 | Temp 98.0°F | Ht 64.5 in | Wt 146.0 lb

## 2012-12-21 DIAGNOSIS — D649 Anemia, unspecified: Secondary | ICD-10-CM | POA: Insufficient documentation

## 2012-12-21 DIAGNOSIS — M810 Age-related osteoporosis without current pathological fracture: Secondary | ICD-10-CM

## 2012-12-21 DIAGNOSIS — E538 Deficiency of other specified B group vitamins: Secondary | ICD-10-CM

## 2012-12-21 DIAGNOSIS — E119 Type 2 diabetes mellitus without complications: Secondary | ICD-10-CM | POA: Diagnosis not present

## 2012-12-21 DIAGNOSIS — J209 Acute bronchitis, unspecified: Secondary | ICD-10-CM

## 2012-12-21 DIAGNOSIS — J449 Chronic obstructive pulmonary disease, unspecified: Secondary | ICD-10-CM

## 2012-12-21 LAB — CBC WITH DIFFERENTIAL/PLATELET
Basophils Relative: 0.5 % (ref 0.0–3.0)
Eosinophils Absolute: 0.1 10*3/uL (ref 0.0–0.7)
Eosinophils Relative: 1.5 % (ref 0.0–5.0)
HCT: 38.1 % (ref 36.0–46.0)
Lymphs Abs: 2.7 10*3/uL (ref 0.7–4.0)
MCHC: 34 g/dL (ref 30.0–36.0)
MCV: 95.7 fl (ref 78.0–100.0)
Monocytes Absolute: 0.8 10*3/uL (ref 0.1–1.0)
Neutro Abs: 5.4 10*3/uL (ref 1.4–7.7)
RBC: 3.98 Mil/uL (ref 3.87–5.11)
WBC: 9.1 10*3/uL (ref 4.5–10.5)

## 2012-12-21 LAB — LDL CHOLESTEROL, DIRECT: Direct LDL: 69.4 mg/dL

## 2012-12-21 LAB — HEMOGLOBIN A1C: Hgb A1c MFr Bld: 5.8 % (ref 4.6–6.5)

## 2012-12-21 LAB — LIPID PANEL
Cholesterol: 153 mg/dL (ref 0–200)
HDL: 40.1 mg/dL (ref >39.00)
Total CHOL/HDL Ratio: 4
VLDL: 55.8 mg/dL — ABNORMAL HIGH (ref 0.0–40.0)

## 2012-12-21 LAB — BASIC METABOLIC PANEL
BUN: 16 mg/dL (ref 6–23)
Chloride: 104 mEq/L (ref 96–112)
Creatinine, Ser: 0.9 mg/dL (ref 0.4–1.2)
GFR: 65.61 mL/min (ref >60.00)
Potassium: 4 mEq/L (ref 3.5–5.1)

## 2012-12-21 LAB — HEPATIC FUNCTION PANEL
Bilirubin, Direct: 0 mg/dL (ref 0.0–0.3)
Total Bilirubin: 0.5 mg/dL (ref 0.3–1.2)

## 2012-12-21 LAB — IBC PANEL: Transferrin: 219.3 mg/dL (ref 212.0–360.0)

## 2012-12-21 MED ORDER — FUROSEMIDE 40 MG PO TABS: 40.0000 mg | ORAL_TABLET | Freq: Every day | ORAL | Status: DC

## 2012-12-21 MED ORDER — HYDROCOD POLST-CHLORPHEN POLST 10-8 MG/5ML PO LQCR: 5.0000 mL | Freq: Two times a day (BID) | ORAL | Status: DC | PRN

## 2012-12-21 MED ORDER — BENAZEPRIL HCL 40 MG PO TABS: 40.0000 mg | ORAL_TABLET | Freq: Every day | ORAL | Status: DC

## 2012-12-21 MED ORDER — LEVOFLOXACIN 250 MG PO TABS: 250.0000 mg | ORAL_TABLET | Freq: Every day | ORAL | Status: DC

## 2012-12-21 MED ORDER — ATENOLOL 50 MG PO TABS: 50.0000 mg | ORAL_TABLET | Freq: Every day | ORAL | Status: DC

## 2012-12-21 MED ORDER — ALENDRONATE SODIUM 70 MG PO TABS: 70.0000 mg | ORAL_TABLET | ORAL | Status: DC

## 2012-12-21 MED ORDER — MELOXICAM 15 MG PO TABS: 15.0000 mg | ORAL_TABLET | Freq: Every day | ORAL | Status: DC

## 2012-12-21 MED ORDER — OMEPRAZOLE 20 MG PO CPDR: DELAYED_RELEASE_CAPSULE | ORAL | Status: DC

## 2012-12-21 MED ORDER — SIMVASTATIN 40 MG PO TABS: 40.0000 mg | ORAL_TABLET | Freq: Every day | ORAL | Status: DC

## 2012-12-21 NOTE — Progress Notes (Signed)
Subjective:    Patient ID: Tara Neal, female    DOB: January 24, 1941, 72 y.o.   MRN: [id number]  HPI  Here with husband, Here with acute onset mild to mod 2-3 days ST, HA, general weakness and malaise, with prod cough greenish sputum, but Pt denies chest pain, increased sob or doe, wheezing, orthopnea, PND, increased LE swelling, palpitations, dizziness or syncope.  Pt denies polydipsia, polyuria, or low sugar symptoms Pt states overall good compliance with meds, trying to follow lower cholesterol diet, wt overall stable but little exercise however.     Due for feb 4 left rotater cuff surgury; also fell with broken wrist about sept with surgury - Dr Amanda Pea.  No overt bleeding or bruising on current meds.  Needs mutl refills.  Prilosec inadvertantly prescribed at one per day with most recent rx,, normally requires 2 per day,  Has some recent breakthrough, o/w ,Denies worseng abd pain, dysphagia, n/v, bowel change or blood. Past Medical History  Diagnosis Date  . Hyperlipidemia   . Hypertension   . Mild obesity   . Spontaneous pneumothorax     1968  . GERD (gastroesophageal reflux disease)   . Osteoarthritis   . Low back pain   . Osteoporosis   . Hormone replacement therapy (postmenopausal)   . Anxiety   . Osteopenia   . COPD (chronic obstructive pulmonary disease)   . Diverticulosis of colon   . Shortness of breath     with exertion   . Anemia     hx of   . Chronic insomnia 04/22/2012  . Scoliosis   . Pneumonia     hx of 5 years ago   . Migraine headache     denies   Past Surgical History  Procedure Date  . Tonsillectomy   . Lumbar disc surgery      x 2  . Other surgical history     benign growth removed from right under ear at age 89   . Excision/release bursa hip 04/26/2012    Procedure: EXCISION/RELEASE BURSA HIP;  Surgeon: Loanne [name], MD;  Location: [facility];  Service: Orthopedics;  Laterality: Right;  . Hip adductor tenotomy   . Eye surgery     - bil and Retina surgery to  left eye  . Tonsillectomy     reports that she quit smoking about 31 years ago. Her smoking use included Cigarettes. She quit after 20 years of use. She has never used smokeless tobacco. She reports that she drinks about 1.8 ounces of alcohol per week. She reports that she does not use illicit drugs. family history includes Diabetes in her mother; Heart disease in her mother; and Hyperlipidemia in her mother. Allergies  Allergen Reactions  . Codeine     REACTION: Nausea  . Penicillins     REACTION: Rash  . Sulfonamide Derivatives     REACTION: Nausea  . Percocet (Oxycodone-Acetaminophen) Itching   Current Outpatient Prescriptions on File Prior to Visit  Medication Sig Dispense Refill  . atenolol (TENORMIN) 50 MG tablet Take 1 tablet (50 mg total) by mouth daily with breakfast.  90 tablet  3  . benazepril (LOTENSIN) 40 MG tablet Take 1 tablet (40 mg total) by mouth daily with breakfast.  90 tablet  3  . calcium carbonate (OS-CAL) 600 MG TABS Take 600 mg by mouth 2 (two) times daily with a meal.      . cetirizine (ZYRTEC) 10 MG tablet Take 10 mg by mouth daily with  breakfast.       . Cholecalciferol (VITAMIN D3) 1000 UNITS CAPS Take 1 capsule by mouth daily.      . furosemide (LASIX) 40 MG tablet Take 1 tablet (40 mg total) by mouth daily with breakfast.  90 tablet  3  . HYDROcodone-acetaminophen (NORCO) 5-325 MG per tablet Take 1 tablet by mouth every 6 (six) hours as needed. 1 tab by mouth every 6 hrs as needed for pain      . HYDROcodone-acetaminophen (NORCO/VICODIN) 5-325 MG per tablet Take 1 tablet by mouth every 4 (four) hours as needed.  40 tablet  0  . methocarbamol (ROBAXIN) 500 MG tablet Take 500 mg by mouth 4 (four) times daily.      . Multiple Vitamin (MULTIVITAMIN) tablet Take 1 tablet by mouth daily.      Marland Kitchen omeprazole (PRILOSEC) 20 MG capsule Take 2 by mouth daily  180 capsule  3  . simvastatin (ZOCOR) 40 MG tablet Take 1 tablet (40 mg total) by mouth daily with breakfast.  90  tablet  3  . venlafaxine XR (EFFEXOR-XR) 150 MG 24 hr capsule Take 150 mg by mouth daily with breakfast.         Review of Systems  Constitutional: Negative for unexpected weight change, or unusual diaphoresis  HENT: Negative for tinnitus.   Eyes: Negative for photophobia and visual disturbance.  Respiratory: Negative for choking and stridor.   Gastrointestinal: Negative for vomiting and blood in stool.  Genitourinary: Negative for hematuria and decreased urine volume.  Musculoskeletal: Negative for acute joint swelling Skin: Negative for color change and wound.  Neurological: Negative for tremors and numbness other than noted  Psychiatric/Behavioral: Negative for decreased concentration or  hyperactivity.       Objective:   Physical Exam BP 134/88  Pulse 65  Temp 98 F (36.7 C) (Oral)  Ht 5' 4.5" (1.638 m)  Wt 146 lb (66.225 kg)  BMI 24.67 kg/m2  SpO2 98% VS noted, mild ill Constitutional: Pt appears well-developed and well-nourished.  HENT: Head: NCAT.  Right Ear: External ear normal.  Left Ear: External ear normal.  Bilat tm's with mild erythema.  Max sinus areas mild tender.  Pharynx with mild erythema, no exudate Eyes: Conjunctivae and EOM are normal. Pupils are equal, round, and reactive to light.  Neck: Normal range of motion. Neck supple.  Cardiovascular: Normal rate and regular rhythm.   Pulmonary/Chest: Effort normal and breath sounds normal.  - no rales or wheezing Neurological: Pt is alert. Not confused  Skin: Skin is warm. No erythema. No bruising noted Psychiatric: Pt behavior is normal. Thought content normal.  Recent wrist fx healed    Assessment & Plan:

## 2012-12-21 NOTE — Patient Instructions (Addendum)
Please take all new medication as prescribed Please continue all other medications as before, and refills have been done if requested. The prilosec is refilled at 2 per day Please go to the LAB in the Basement (turn left off the elevator) for the tests to be done today You will be contacted by phone if any changes need to be made immediately.  Otherwise, you will receive a letter about your results with an explanation, but please check with MyChart first. Thank you for enrolling in MyChart. Please follow the instructions below to securely access your online medical record. MyChart allows you to send messages to your doctor, view your test results, renew your prescriptions, schedule appointments, and more. To Log into My Chart online, please go by [employer] or [employer] to [email or web address], or download the MyChart App from the [employer] of [employer] .  Your Username is: [email or web address] (pass:  Engineer, drilling) Please send a Engineer, water on Mychart later today. Please schedule the bone density test before leaving today at the scheduling desk (where you check out) Tara Neal with your surgury for the left shoulder. Please return in 6 months, or sooner if needed

## 2012-12-23 ENCOUNTER — Encounter: Payer: Self-pay | Admitting: Internal Medicine

## 2012-12-23 NOTE — Assessment & Plan Note (Signed)
With recent wrist fx, due for f/u dxa

## 2012-12-23 NOTE — Assessment & Plan Note (Signed)
stable overall by history and exam, recent data reviewed with pt, and pt to continue medical treatment as before,  to f/u any worsening symptoms or concerns Lab Results  Component Value Date   HGBA1C 5.8 12/21/2012

## 2012-12-23 NOTE — Assessment & Plan Note (Addendum)
Mild to mod, for antibx course,  to f/u any worsening symptoms or concerns  Note:  Total time for pt hx, exam, review of record with pt in the room, determination of diagnoses and plan for further eval and tx is > 40 min, with over 50% spent in coordination and counseling of patient 

## 2012-12-23 NOTE — Assessment & Plan Note (Signed)
stable overall by history and exam, recent data reviewed with pt, and pt to continue medical treatment as before,  to f/u any worsening symptoms or concerns, for cbc f/u Lab Results  Component Value Date   WBC 9.1 12/21/2012   HGB 12.9 12/21/2012   HCT 38.1 12/21/2012   MCV 95.7 12/21/2012   PLT 279.0 12/21/2012

## 2012-12-23 NOTE — Assessment & Plan Note (Signed)
stable overall by history and exam, recent data reviewed with pt, and pt to continue medical treatment as before,  to f/u any worsening symptoms or concerns SpO2 Readings from Last 3 Encounters:  12/21/12 98%  08/23/12 95%  08/23/12 95%

## 2012-12-28 ENCOUNTER — Encounter: Payer: Medicare Other | Admitting: Internal Medicine

## 2013-01-02 DIAGNOSIS — G8918 Other acute postprocedural pain: Secondary | ICD-10-CM | POA: Diagnosis not present

## 2013-01-02 DIAGNOSIS — M24119 Other articular cartilage disorders, unspecified shoulder: Secondary | ICD-10-CM | POA: Diagnosis not present

## 2013-01-02 DIAGNOSIS — M719 Bursopathy, unspecified: Secondary | ICD-10-CM | POA: Diagnosis not present

## 2013-01-02 DIAGNOSIS — M19019 Primary osteoarthritis, unspecified shoulder: Secondary | ICD-10-CM | POA: Diagnosis not present

## 2013-01-03 DIAGNOSIS — S43429A Sprain of unspecified rotator cuff capsule, initial encounter: Secondary | ICD-10-CM | POA: Diagnosis not present

## 2013-01-10 ENCOUNTER — Other Ambulatory Visit: Payer: Self-pay | Admitting: Internal Medicine

## 2013-01-12 DIAGNOSIS — S43429A Sprain of unspecified rotator cuff capsule, initial encounter: Secondary | ICD-10-CM | POA: Diagnosis not present

## 2013-01-15 DIAGNOSIS — S43429A Sprain of unspecified rotator cuff capsule, initial encounter: Secondary | ICD-10-CM | POA: Diagnosis not present

## 2013-01-18 DIAGNOSIS — S43429A Sprain of unspecified rotator cuff capsule, initial encounter: Secondary | ICD-10-CM | POA: Diagnosis not present

## 2013-01-22 DIAGNOSIS — S43429A Sprain of unspecified rotator cuff capsule, initial encounter: Secondary | ICD-10-CM | POA: Diagnosis not present

## 2013-01-26 DIAGNOSIS — S43429A Sprain of unspecified rotator cuff capsule, initial encounter: Secondary | ICD-10-CM | POA: Diagnosis not present

## 2013-02-01 DIAGNOSIS — S43429A Sprain of unspecified rotator cuff capsule, initial encounter: Secondary | ICD-10-CM | POA: Diagnosis not present

## 2013-02-06 DIAGNOSIS — H4011X0 Primary open-angle glaucoma, stage unspecified: Secondary | ICD-10-CM | POA: Diagnosis not present

## 2013-02-06 DIAGNOSIS — H409 Unspecified glaucoma: Secondary | ICD-10-CM | POA: Diagnosis not present

## 2013-02-08 DIAGNOSIS — S43429A Sprain of unspecified rotator cuff capsule, initial encounter: Secondary | ICD-10-CM | POA: Diagnosis not present

## 2013-02-12 DIAGNOSIS — S43429A Sprain of unspecified rotator cuff capsule, initial encounter: Secondary | ICD-10-CM | POA: Diagnosis not present

## 2013-02-15 DIAGNOSIS — S43429A Sprain of unspecified rotator cuff capsule, initial encounter: Secondary | ICD-10-CM | POA: Diagnosis not present

## 2013-02-16 ENCOUNTER — Telehealth: Payer: Self-pay | Admitting: Internal Medicine

## 2013-02-16 MED ORDER — SIMVASTATIN 40 MG PO TABS: 40.0000 mg | ORAL_TABLET | Freq: Every day | ORAL | Status: DC

## 2013-02-16 NOTE — Telephone Encounter (Signed)
Request refill on simvastatin, Please send into to [facility]. Pt request call once sent

## 2013-02-16 NOTE — Telephone Encounter (Signed)
Patient informed. 

## 2013-02-19 DIAGNOSIS — S43429A Sprain of unspecified rotator cuff capsule, initial encounter: Secondary | ICD-10-CM | POA: Diagnosis not present

## 2013-02-22 DIAGNOSIS — S43429A Sprain of unspecified rotator cuff capsule, initial encounter: Secondary | ICD-10-CM | POA: Diagnosis not present

## 2013-02-22 DIAGNOSIS — Z4889 Encounter for other specified surgical aftercare: Secondary | ICD-10-CM | POA: Diagnosis not present

## 2013-02-26 DIAGNOSIS — S43429A Sprain of unspecified rotator cuff capsule, initial encounter: Secondary | ICD-10-CM | POA: Diagnosis not present

## 2013-03-01 DIAGNOSIS — S43429A Sprain of unspecified rotator cuff capsule, initial encounter: Secondary | ICD-10-CM | POA: Diagnosis not present

## 2013-03-05 DIAGNOSIS — S43429A Sprain of unspecified rotator cuff capsule, initial encounter: Secondary | ICD-10-CM | POA: Diagnosis not present

## 2013-03-08 DIAGNOSIS — S43429A Sprain of unspecified rotator cuff capsule, initial encounter: Secondary | ICD-10-CM | POA: Diagnosis not present

## 2013-03-12 DIAGNOSIS — S43429A Sprain of unspecified rotator cuff capsule, initial encounter: Secondary | ICD-10-CM | POA: Diagnosis not present

## 2013-03-15 DIAGNOSIS — S43429A Sprain of unspecified rotator cuff capsule, initial encounter: Secondary | ICD-10-CM | POA: Diagnosis not present

## 2013-03-19 DIAGNOSIS — S43429A Sprain of unspecified rotator cuff capsule, initial encounter: Secondary | ICD-10-CM | POA: Diagnosis not present

## 2013-03-22 DIAGNOSIS — S43429A Sprain of unspecified rotator cuff capsule, initial encounter: Secondary | ICD-10-CM | POA: Diagnosis not present

## 2013-03-23 DIAGNOSIS — H409 Unspecified glaucoma: Secondary | ICD-10-CM | POA: Diagnosis not present

## 2013-03-23 DIAGNOSIS — H401290 Low-tension glaucoma, unspecified eye, stage unspecified: Secondary | ICD-10-CM | POA: Diagnosis not present

## 2013-03-26 DIAGNOSIS — S43429A Sprain of unspecified rotator cuff capsule, initial encounter: Secondary | ICD-10-CM | POA: Diagnosis not present

## 2013-03-29 DIAGNOSIS — S43429A Sprain of unspecified rotator cuff capsule, initial encounter: Secondary | ICD-10-CM | POA: Diagnosis not present

## 2013-04-02 DIAGNOSIS — S43429A Sprain of unspecified rotator cuff capsule, initial encounter: Secondary | ICD-10-CM | POA: Diagnosis not present

## 2013-04-09 DIAGNOSIS — S43429A Sprain of unspecified rotator cuff capsule, initial encounter: Secondary | ICD-10-CM | POA: Diagnosis not present

## 2013-04-18 DIAGNOSIS — S43429A Sprain of unspecified rotator cuff capsule, initial encounter: Secondary | ICD-10-CM | POA: Diagnosis not present

## 2013-04-19 DIAGNOSIS — Z4889 Encounter for other specified surgical aftercare: Secondary | ICD-10-CM | POA: Diagnosis not present

## 2013-04-24 DIAGNOSIS — S43429A Sprain of unspecified rotator cuff capsule, initial encounter: Secondary | ICD-10-CM | POA: Diagnosis not present

## 2013-04-26 DIAGNOSIS — S43429A Sprain of unspecified rotator cuff capsule, initial encounter: Secondary | ICD-10-CM | POA: Diagnosis not present

## 2013-04-30 DIAGNOSIS — S43429A Sprain of unspecified rotator cuff capsule, initial encounter: Secondary | ICD-10-CM | POA: Diagnosis not present

## 2013-05-03 DIAGNOSIS — S43429A Sprain of unspecified rotator cuff capsule, initial encounter: Secondary | ICD-10-CM | POA: Diagnosis not present

## 2013-05-07 ENCOUNTER — Other Ambulatory Visit: Payer: Self-pay

## 2013-05-07 DIAGNOSIS — S43429A Sprain of unspecified rotator cuff capsule, initial encounter: Secondary | ICD-10-CM | POA: Diagnosis not present

## 2013-05-07 MED ORDER — VENLAFAXINE HCL ER 150 MG PO CP24: 150.0000 mg | ORAL_CAPSULE | Freq: Every day | ORAL | Status: DC

## 2013-05-10 DIAGNOSIS — S43429A Sprain of unspecified rotator cuff capsule, initial encounter: Secondary | ICD-10-CM | POA: Diagnosis not present

## 2013-05-14 DIAGNOSIS — S43429A Sprain of unspecified rotator cuff capsule, initial encounter: Secondary | ICD-10-CM | POA: Diagnosis not present

## 2013-05-17 DIAGNOSIS — S43429A Sprain of unspecified rotator cuff capsule, initial encounter: Secondary | ICD-10-CM | POA: Diagnosis not present

## 2013-05-17 DIAGNOSIS — M76899 Other specified enthesopathies of unspecified lower limb, excluding foot: Secondary | ICD-10-CM | POA: Diagnosis not present

## 2013-05-17 DIAGNOSIS — M7020 Olecranon bursitis, unspecified elbow: Secondary | ICD-10-CM | POA: Diagnosis not present

## 2013-05-17 DIAGNOSIS — Z4889 Encounter for other specified surgical aftercare: Secondary | ICD-10-CM | POA: Diagnosis not present

## 2013-05-21 DIAGNOSIS — S43429A Sprain of unspecified rotator cuff capsule, initial encounter: Secondary | ICD-10-CM | POA: Diagnosis not present

## 2013-05-22 DIAGNOSIS — H4011X0 Primary open-angle glaucoma, stage unspecified: Secondary | ICD-10-CM | POA: Diagnosis not present

## 2013-05-22 DIAGNOSIS — Z961 Presence of intraocular lens: Secondary | ICD-10-CM | POA: Diagnosis not present

## 2013-05-22 DIAGNOSIS — H401290 Low-tension glaucoma, unspecified eye, stage unspecified: Secondary | ICD-10-CM | POA: Diagnosis not present

## 2013-05-28 DIAGNOSIS — S43429A Sprain of unspecified rotator cuff capsule, initial encounter: Secondary | ICD-10-CM | POA: Diagnosis not present

## 2013-05-31 DIAGNOSIS — S43429A Sprain of unspecified rotator cuff capsule, initial encounter: Secondary | ICD-10-CM | POA: Diagnosis not present

## 2013-07-04 ENCOUNTER — Other Ambulatory Visit: Payer: Self-pay

## 2013-07-24 ENCOUNTER — Encounter: Payer: Self-pay | Admitting: Internal Medicine

## 2013-07-24 ENCOUNTER — Ambulatory Visit (INDEPENDENT_AMBULATORY_CARE_PROVIDER_SITE_OTHER): Payer: Medicare Other | Admitting: Internal Medicine

## 2013-07-24 VITALS — BP 140/80 | HR 61 | Temp 97.0°F | Ht 64.0 in | Wt 144.4 lb

## 2013-07-24 DIAGNOSIS — M25559 Pain in unspecified hip: Secondary | ICD-10-CM | POA: Diagnosis not present

## 2013-07-24 DIAGNOSIS — L989 Disorder of the skin and subcutaneous tissue, unspecified: Secondary | ICD-10-CM

## 2013-07-24 DIAGNOSIS — K429 Umbilical hernia without obstruction or gangrene: Secondary | ICD-10-CM | POA: Diagnosis not present

## 2013-07-24 DIAGNOSIS — I10 Essential (primary) hypertension: Secondary | ICD-10-CM | POA: Diagnosis not present

## 2013-07-24 DIAGNOSIS — M25551 Pain in right hip: Secondary | ICD-10-CM

## 2013-07-24 HISTORY — DX: Umbilical hernia without obstruction or gangrene: K42.9

## 2013-07-24 MED ORDER — HYDROCODONE-ACETAMINOPHEN 5-325 MG PO TABS: 1.0000 | ORAL_TABLET | Freq: Four times a day (QID) | ORAL | Status: DC | PRN

## 2013-07-24 NOTE — Progress Notes (Signed)
Subjective:    Patient ID: Tara Neal, female    DOB: 1941-04-24, 72 y.o.   MRN: [id number]  HPI  Here with 1 mo enlarging lump at the navel, no pain or n/v, bowel change, fever.  Also with a skin lesion to LUQ for approx 6 mo no change but wanted looked at.  Also s/p right hip surgury x 2 yrs, but has persistent significant tender and soreness at the site, worse with walking, stil require hydrocodone.  Pt denies chest pain, increased sob or doe, wheezing, orthopnea, PND, increased LE swelling, palpitations, dizziness or syncope.  Pt denies new neurological symptoms such as new headache, or facial or extremity weakness or numbness Past Medical History  Diagnosis Date  . Hyperlipidemia   . Hypertension   . Mild obesity   . Spontaneous pneumothorax     1968  . GERD (gastroesophageal reflux disease)   . Osteoarthritis   . Low back pain   . Osteoporosis   . Hormone replacement therapy (postmenopausal)   . Anxiety   . Osteopenia   . COPD (chronic obstructive pulmonary disease)   . Diverticulosis of colon   . Shortness of breath     with exertion   . Anemia     hx of   . Chronic insomnia 04/22/2012  . Scoliosis   . Pneumonia     hx of 5 years ago   . Migraine headache     denies  . Umbilical hernia 07/24/2013   Past Surgical History  Procedure Laterality Date  . Tonsillectomy    . Lumbar disc surgery       x 2  . Other surgical history      benign growth removed from right under ear at age 31   . Excision/release bursa hip  04/26/2012    Procedure: EXCISION/RELEASE BURSA HIP;  Surgeon: Loanne [name], MD;  Location: [facility];  Service: Orthopedics;  Laterality: Right;  . Hip adductor tenotomy    . Eye surgery      - bil and Retina surgery to left eye  . Tonsillectomy      reports that she quit smoking about 31 years ago. Her smoking use included Cigarettes. She smoked 0.00 packs per day for 20 years. She has never used smokeless tobacco. She reports that she drinks about 1.8  ounces of alcohol per week. She reports that she does not use illicit drugs. family history includes Diabetes in her mother; Heart disease in her mother; Hyperlipidemia in her mother. Allergies  Allergen Reactions  . Codeine     REACTION: Nausea  . Penicillins     REACTION: Rash  . Sulfonamide Derivatives     REACTION: Nausea  . Percocet [Oxycodone-Acetaminophen] Itching   Current Outpatient Prescriptions on File Prior to Visit  Medication Sig Dispense Refill  . alendronate (FOSAMAX) 70 MG tablet Take 1 tablet (70 mg total) by mouth every 7 (seven) days. On Saturdays. Take with a full glass of water on an empty stomach.  12 tablet  3  . atenolol (TENORMIN) 50 MG tablet TAKE 1 TABLET EVERY DAY  90 tablet  3  . benazepril (LOTENSIN) 40 MG tablet Take 1 tablet (40 mg total) by mouth daily with breakfast.  90 tablet  3  . calcium carbonate (OS-CAL) 600 MG TABS Take 600 mg by mouth 2 (two) times daily with a meal.      . cetirizine (ZYRTEC) 10 MG tablet Take 10 mg by mouth daily with breakfast.       .  chlorpheniramine-HYDROcodone (TUSSIONEX PENNKINETIC ER) 10-8 MG/5ML LQCR Take 5 mLs by mouth every 12 (twelve) hours as needed.  140 mL  1  . Cholecalciferol (VITAMIN D3) 1000 UNITS CAPS Take 1 capsule by mouth daily.      . furosemide (LASIX) 40 MG tablet TAKE 1 TABLET EVERY DAY  90 tablet  3  . meloxicam (MOBIC) 15 MG tablet Take 1 tablet (15 mg total) by mouth daily with breakfast.  90 tablet  3  . methocarbamol (ROBAXIN) 500 MG tablet Take 500 mg by mouth 4 (four) times daily.      . Multiple Vitamin (MULTIVITAMIN) tablet Take 1 tablet by mouth daily.      Marland Kitchen omeprazole (PRILOSEC) 20 MG capsule Take 2 by mouth daily  180 capsule  3  . simvastatin (ZOCOR) 40 MG tablet Take 1 tablet (40 mg total) by mouth daily with breakfast.  90 tablet  3  . venlafaxine XR (EFFEXOR-XR) 150 MG 24 hr capsule Take 1 capsule (150 mg total) by mouth daily with breakfast.  90 capsule  2   No current  facility-administered medications on file prior to visit.      Review of Systems  Constitutional: Negative for unexpected weight change, or unusual diaphoresis  HENT: Negative for tinnitus.   Eyes: Negative for photophobia and visual disturbance.  Respiratory: Negative for choking and stridor.   Gastrointestinal: Negative for vomiting and blood in stool.  Genitourinary: Negative for hematuria and decreased urine volume.  Musculoskeletal: Negative for acute joint swelling Skin: Negative for color change and wound.  Neurological: Negative for tremors and numbness other than noted  Psychiatric/Behavioral: Negative for decreased concentration or  hyperactivity.       Objective:   Physical Exam BP 140/80  Pulse 61  Temp(Src) 97 F (36.1 C) (Oral)  Ht 5\' 4"  (1.626 m)  Wt 144 lb 6 oz (65.488 kg)  BMI 24.77 kg/m2  SpO2 97% VS noted, not ill appearing Constitutional: Pt appears well-developed and well-nourished.  HENT: Head: NCAT.  Right Ear: External ear normal.  Left Ear: External ear normal.  Eyes: Conjunctivae and EOM are normal. Pupils are equal, round, and reactive to light.  Neck: Normal range of motion. Neck supple.  Cardiovascular: Normal rate and regular rhythm.   Pulmonary/Chest: Effort normal and breath sounds normal.  Abd:  Soft, NT, non-distended, + BS, with smiall umbilical hernia NT and reducible Neurological: Pt is alert. Not confused  Skin: Skin is warm. No erythema. Skin lesion LUQ flat, tan, 1/2 cm, c/w keratosis, has several on back as well Psychiatric: Pt behavior is normal. Thought content normal.     Assessment & Plan:

## 2013-07-24 NOTE — Assessment & Plan Note (Signed)
Unclear etiology, for pain control.refill, refer ortho

## 2013-07-24 NOTE — Assessment & Plan Note (Signed)
stable overall by history and exam, recent data reviewed with pt, and pt to continue medical treatment as before,  to f/u any worsening symptoms or concerns BP Readings from Last 3 Encounters:  07/24/13 140/80  12/21/12 134/88  08/23/12 91/56

## 2013-07-24 NOTE — Assessment & Plan Note (Signed)
New but asympt, ok to follow

## 2013-07-24 NOTE — Assessment & Plan Note (Signed)
C/w keratosis by exam, ok to follow

## 2013-07-24 NOTE — Patient Instructions (Signed)
Please return if the hernia has pain in the future Please continue all other medications as before, and refills have been done if requested, including the pain medication You will be contacted regarding the referral for: Dr Lequita Halt

## 2013-08-10 DIAGNOSIS — M76899 Other specified enthesopathies of unspecified lower limb, excluding foot: Secondary | ICD-10-CM | POA: Diagnosis not present

## 2013-08-16 DIAGNOSIS — M25559 Pain in unspecified hip: Secondary | ICD-10-CM | POA: Diagnosis not present

## 2013-08-20 DIAGNOSIS — M25559 Pain in unspecified hip: Secondary | ICD-10-CM | POA: Diagnosis not present

## 2013-08-22 DIAGNOSIS — M25559 Pain in unspecified hip: Secondary | ICD-10-CM | POA: Diagnosis not present

## 2013-08-27 DIAGNOSIS — M25559 Pain in unspecified hip: Secondary | ICD-10-CM | POA: Diagnosis not present

## 2013-08-29 DIAGNOSIS — M25559 Pain in unspecified hip: Secondary | ICD-10-CM | POA: Diagnosis not present

## 2013-09-03 DIAGNOSIS — M25559 Pain in unspecified hip: Secondary | ICD-10-CM | POA: Diagnosis not present

## 2013-09-04 DIAGNOSIS — Z23 Encounter for immunization: Secondary | ICD-10-CM | POA: Diagnosis not present

## 2013-09-05 DIAGNOSIS — M25559 Pain in unspecified hip: Secondary | ICD-10-CM | POA: Diagnosis not present

## 2013-09-10 DIAGNOSIS — M25559 Pain in unspecified hip: Secondary | ICD-10-CM | POA: Diagnosis not present

## 2013-09-13 DIAGNOSIS — M25559 Pain in unspecified hip: Secondary | ICD-10-CM | POA: Diagnosis not present

## 2013-09-18 DIAGNOSIS — M25559 Pain in unspecified hip: Secondary | ICD-10-CM | POA: Diagnosis not present

## 2013-09-19 ENCOUNTER — Ambulatory Visit (INDEPENDENT_AMBULATORY_CARE_PROVIDER_SITE_OTHER): Payer: BLUE CROSS/BLUE SHIELD | Admitting: General Surgery

## 2013-09-25 DIAGNOSIS — H4011X0 Primary open-angle glaucoma, stage unspecified: Secondary | ICD-10-CM | POA: Diagnosis not present

## 2013-09-25 DIAGNOSIS — H401290 Low-tension glaucoma, unspecified eye, stage unspecified: Secondary | ICD-10-CM | POA: Diagnosis not present

## 2013-09-25 DIAGNOSIS — H409 Unspecified glaucoma: Secondary | ICD-10-CM | POA: Diagnosis not present

## 2013-10-02 ENCOUNTER — Encounter (INDEPENDENT_AMBULATORY_CARE_PROVIDER_SITE_OTHER): Payer: Self-pay | Admitting: Surgery

## 2013-10-02 ENCOUNTER — Ambulatory Visit (INDEPENDENT_AMBULATORY_CARE_PROVIDER_SITE_OTHER): Payer: Medicare Other | Admitting: Surgery

## 2013-10-02 VITALS — BP 144/82 | HR 64 | Resp 16 | Ht 64.5 in | Wt 146.2 lb

## 2013-10-02 DIAGNOSIS — K429 Umbilical hernia without obstruction or gangrene: Secondary | ICD-10-CM

## 2013-10-02 NOTE — Progress Notes (Signed)
Patient ID: Tara Neal, female   DOB: [birth year], 72 y.o.   MRN: [id number]  Chief Complaint  Patient presents with  . New Evaluation    eval poss umb hernia    HPI Tara Neal is a 72 y.o. female.  Referred by Dr. Oliver Barre for evaluation of umbilical hernia  HPI This is a 72 year old female who presents with a one-year history of an enlarging bulge in her umbilicus. This has become fairly large and uncomfortable. It is very noticeable. She denies any obstructive symptoms. She was examined by Dr. Jonny Ruiz who diagnosed an umbilical hernia. She is now referred for surgical evaluation. She has not had any previous  Abdominal surgery.    Past Medical History  Diagnosis Date  . Hyperlipidemia   . Hypertension   . Mild obesity   . Spontaneous pneumothorax     1968  . GERD (gastroesophageal reflux disease)   . Osteoarthritis   . Low back pain   . Osteoporosis   . Hormone replacement therapy (postmenopausal)   . Anxiety   . Osteopenia   . COPD (chronic obstructive pulmonary disease)   . Diverticulosis of colon   . Shortness of breath     with exertion   . Anemia     hx of   . Chronic insomnia 04/22/2012  . Scoliosis   . Pneumonia     hx of 5 years ago   . Migraine headache     denies  . Umbilical hernia 07/24/2013    Past Surgical History  Procedure Laterality Date  . Tonsillectomy    . Lumbar disc surgery       x 2  . Other surgical history      benign growth removed from right under ear at age 37   . Excision/release bursa hip  04/26/2012    Procedure: EXCISION/RELEASE BURSA HIP;  Surgeon: Loanne [name], MD;  Location: [facility];  Service: Orthopedics;  Laterality: Right;  . Hip adductor tenotomy    . Eye surgery      - bil and Retina surgery to left eye  . Tonsillectomy      Family History  Problem Relation Age of Onset  . Hyperlipidemia Mother   . Heart disease Mother   . Diabetes Mother     Social History History  Substance Use Topics  . Smoking status:  Former Smoker -- 20 years    Types: Cigarettes    Quit date: 11/29/1981  . Smokeless tobacco: Never Used  . Alcohol Use: 1.8 oz/week    3 Glasses of wine per week    Allergies  Allergen Reactions  . Codeine     REACTION: Nausea  . Penicillins     REACTION: Rash  . Sulfonamide Derivatives     REACTION: Nausea  . Percocet [Oxycodone-Acetaminophen] Itching    Current Outpatient Prescriptions  Medication Sig Dispense Refill  . alendronate (FOSAMAX) 70 MG tablet Take 1 tablet (70 mg total) by mouth every 7 (seven) days. On Saturdays. Take with a full glass of water on an empty stomach.  12 tablet  3  . atenolol (TENORMIN) 50 MG tablet TAKE 1 TABLET EVERY DAY  90 tablet  3  . benazepril (LOTENSIN) 40 MG tablet Take 1 tablet (40 mg total) by mouth daily with breakfast.  90 tablet  3  . calcium carbonate (OS-CAL) 600 MG TABS Take 600 mg by mouth 2 (two) times daily with a meal.      .  cetirizine (ZYRTEC) 10 MG tablet Take 10 mg by mouth daily with breakfast.       . chlorpheniramine-HYDROcodone (TUSSIONEX PENNKINETIC ER) 10-8 MG/5ML LQCR Take 5 mLs by mouth every 12 (twelve) hours as needed.  140 mL  1  . Cholecalciferol (VITAMIN D3) 1000 UNITS CAPS Take 1 capsule by mouth daily.      . furosemide (LASIX) 40 MG tablet TAKE 1 TABLET EVERY DAY  90 tablet  3  . meloxicam (MOBIC) 15 MG tablet Take 1 tablet (15 mg total) by mouth daily with breakfast.  90 tablet  3  . methocarbamol (ROBAXIN) 500 MG tablet Take 500 mg by mouth 4 (four) times daily.      . Multiple Vitamin (MULTIVITAMIN) tablet Take 1 tablet by mouth daily.      Marland Kitchen omeprazole (PRILOSEC) 20 MG capsule Take 2 by mouth daily  180 capsule  3  . simvastatin (ZOCOR) 40 MG tablet Take 1 tablet (40 mg total) by mouth daily with breakfast.  90 tablet  3  . venlafaxine XR (EFFEXOR-XR) 150 MG 24 hr capsule Take 1 capsule (150 mg total) by mouth daily with breakfast.  90 capsule  2  . HYDROcodone-acetaminophen (NORCO/VICODIN) 5-325 MG per  tablet Take 1 tablet by mouth every 6 (six) hours as needed. 1 tab by mouth every 6 hrs as needed for pain  60 tablet  1   No current facility-administered medications for this visit.    Review of Systems Review of Systems  Constitutional: Negative for fever, chills and unexpected weight change.  HENT: Negative for congestion, hearing loss, sore throat, trouble swallowing and voice change.   Eyes: Negative for visual disturbance.  Respiratory: Negative for cough and wheezing.   Cardiovascular: Negative for chest pain, palpitations and leg swelling.  Gastrointestinal: Positive for abdominal distention. Negative for nausea, vomiting, abdominal pain, diarrhea, constipation, blood in stool and anal bleeding.  Genitourinary: Negative for hematuria, vaginal bleeding and difficulty urinating.  Musculoskeletal: Positive for arthralgias, back pain and joint swelling.  Skin: Negative for rash and wound.  Neurological: Negative for seizures, syncope and headaches.  Hematological: Negative for adenopathy. Does not bruise/bleed easily.  Psychiatric/Behavioral: Negative for confusion.    Blood pressure 144/82, pulse 64, resp. rate 16, height 5' 4.5" (1.638 m), weight 146 lb 3.2 oz (66.316 kg).  Physical Exam Physical Exam WDWN in NAD HEENT:  EOMI, sclera anicteric Neck:  No masses, no thyromegaly Lungs:  CTA bilaterally; normal respiratory effort CV:  Regular rate and rhythm; no murmurs Abd:  +bowel sounds, soft, non-tender, Protruding umbilicus-partially reducible. Ext:  Well-perfused; no edema Skin:  Warm, dry; no sign of jaundice  Data Reviewed None  Assessment    Umbilical hernia-reducible     Plan    Umbilical hernia repair with mesh. The surgical procedure has been discussed with the patient.  Potential risks, benefits, alternative treatments, and expected outcomes have been explained.  All of the patient's questions at this time have been answered.  The likelihood of reaching the  patient's treatment goal is good.  The patient understand the proposed surgical procedure and wishes to proceed.         Tara Armetta K. 10/02/2013, 11:24 AM

## 2013-10-15 DIAGNOSIS — K429 Umbilical hernia without obstruction or gangrene: Secondary | ICD-10-CM | POA: Diagnosis not present

## 2013-10-15 HISTORY — PX: HERNIA REPAIR: SHX51

## 2013-10-16 ENCOUNTER — Telehealth (INDEPENDENT_AMBULATORY_CARE_PROVIDER_SITE_OTHER): Payer: Self-pay | Admitting: *Deleted

## 2013-10-16 ENCOUNTER — Other Ambulatory Visit (INDEPENDENT_AMBULATORY_CARE_PROVIDER_SITE_OTHER): Payer: Self-pay | Admitting: *Deleted

## 2013-10-16 MED ORDER — OXYCODONE-ACETAMINOPHEN 5-325 MG PO TABS: 1.0000 | ORAL_TABLET | ORAL | Status: DC | PRN

## 2013-10-16 NOTE — Telephone Encounter (Signed)
I called pt to check on her postoperatively.  She states she is doing good just a little sore, but less than expected.  She states she is eating fine.  She did have some itching yesterday evening; however, this has resolved.  I informed pt of her post op appt with Dr. Corliss Skains on 12/8 with an arrival time of 1:20pm.  She is agreeable with this appt.  I also instructed pt to call our office if she has any questions or concerns.

## 2013-10-24 ENCOUNTER — Telehealth: Payer: Self-pay

## 2013-10-24 MED ORDER — ATENOLOL 50 MG PO TABS: 50.0000 mg | ORAL_TABLET | Freq: Every day | ORAL | Status: DC

## 2013-10-24 MED ORDER — ALENDRONATE SODIUM 70 MG PO TABS: 70.0000 mg | ORAL_TABLET | ORAL | Status: DC

## 2013-10-24 NOTE — Telephone Encounter (Signed)
mailorder

## 2013-10-31 ENCOUNTER — Other Ambulatory Visit: Payer: Self-pay

## 2013-10-31 DIAGNOSIS — Z1231 Encounter for screening mammogram for malignant neoplasm of breast: Secondary | ICD-10-CM

## 2013-11-05 ENCOUNTER — Ambulatory Visit (INDEPENDENT_AMBULATORY_CARE_PROVIDER_SITE_OTHER): Payer: Medicare Other | Admitting: Surgery

## 2013-11-05 ENCOUNTER — Encounter (INDEPENDENT_AMBULATORY_CARE_PROVIDER_SITE_OTHER): Payer: Self-pay | Admitting: Surgery

## 2013-11-05 VITALS — BP 128/84 | HR 71 | Temp 98.8°F | Resp 16 | Ht 64.0 in | Wt 147.8 lb

## 2013-11-05 DIAGNOSIS — K429 Umbilical hernia without obstruction or gangrene: Secondary | ICD-10-CM

## 2013-11-05 NOTE — Progress Notes (Signed)
Status post umbilical hernia repair with mesh on 10/15/13 for a 1.5 cm hernia defect. She is doing quite well. She is no longer using pain medication. Her incision is well-healed with no sign of infection. No sign of recurrent hernia. She may resume full extubated. Followup as needed.  Wilmon Arms. Corliss Skains, MD, [facility]  General/ Trauma Surgery  11/05/2013 1:40 PM

## 2013-11-23 ENCOUNTER — Encounter: Payer: Self-pay | Admitting: Internal Medicine

## 2013-11-23 ENCOUNTER — Ambulatory Visit (INDEPENDENT_AMBULATORY_CARE_PROVIDER_SITE_OTHER): Payer: Medicare Other | Admitting: Internal Medicine

## 2013-11-23 VITALS — BP 112/72 | HR 79 | Temp 99.2°F | Wt 146.1 lb

## 2013-11-23 DIAGNOSIS — R05 Cough: Secondary | ICD-10-CM | POA: Diagnosis not present

## 2013-11-23 DIAGNOSIS — J329 Chronic sinusitis, unspecified: Secondary | ICD-10-CM

## 2013-11-23 MED ORDER — AZITHROMYCIN 250 MG PO TABS: ORAL_TABLET | ORAL | Status: DC

## 2013-11-23 MED ORDER — HYDROCOD POLST-CHLORPHEN POLST 10-8 MG/5ML PO LQCR: 5.0000 mL | Freq: Two times a day (BID) | ORAL | Status: DC | PRN

## 2013-11-23 NOTE — Patient Instructions (Signed)
It was good to see you today.  Zpak antibiotics and prescription cough syrup - Your prescription(s) have been submitted to your pharmacy (syrup written prescription given to you to take to your pharmacy). Please take as directed and contact our office if you believe you are having problem(s) with the medication(s).  Alternate between ibuprofen and tylenol for aches, pain and fever symptoms as discussed  Hydrate, rest and call if worse or unimproved

## 2013-11-23 NOTE — Progress Notes (Signed)
Pre-visit discussion using our clinic review tool. No additional management support is needed unless otherwise documented below in the visit note.  

## 2013-11-23 NOTE — Progress Notes (Signed)
Subjective:    Patient ID: Tara Neal, female    DOB: 1941/03/01, 72 y.o.   MRN: [id number]  URI  This is a new problem. The current episode started yesterday. The problem has been gradually worsening. There has been no fever. The fever has been present for 1 to 2 days. Associated symptoms include congestion (nasal>chest), coughing, ear pain, headaches, a plugged ear sensation, sneezing and a sore throat. Pertinent negatives include no chest pain, diarrhea, dysuria, joint pain, joint swelling, nausea, neck pain, rash, rhinorrhea, sinus pain, swollen glands, vomiting or wheezing. She has tried acetaminophen (Mucinex DM) for the symptoms. The treatment provided mild relief.  Family concerned due to pt hx pneumonia and spouse need for back surgery next week  Past Medical History  Diagnosis Date  . Hyperlipidemia   . Hypertension   . Mild obesity   . Spontaneous pneumothorax     1968  . GERD (gastroesophageal reflux disease)   . Osteoarthritis   . Low back pain   . Osteoporosis   . Hormone replacement therapy (postmenopausal)   . Anxiety   . Osteopenia   . COPD (chronic obstructive pulmonary disease)   . Diverticulosis of colon   . Shortness of breath     with exertion   . Anemia     hx of   . Chronic insomnia 04/22/2012  . Scoliosis   . Pneumonia     hx of 5 years ago   . Migraine headache     denies  . Umbilical hernia 07/24/2013    Review of Systems  HENT: Positive for congestion (nasal>chest), ear pain, sneezing and sore throat. Negative for rhinorrhea.   Respiratory: Positive for cough. Negative for wheezing.   Cardiovascular: Negative for chest pain.  Gastrointestinal: Negative for nausea, vomiting and diarrhea.  Genitourinary: Negative for dysuria.  Musculoskeletal: Negative for joint pain and neck pain.  Skin: Negative for rash.  Neurological: Positive for headaches.       Objective:   Physical Exam BP 112/72  Pulse 79  Temp(Src) 99.2 F (37.3 C) (Oral)  Wt  146 lb 1.9 oz (66.28 kg)  SpO2 97% Wt Readings from Last 3 Encounters:  11/23/13 146 lb 1.9 oz (66.28 kg)  11/05/13 147 lb 12.8 oz (67.042 kg)  10/02/13 146 lb 3.2 oz (66.316 kg)   Constitutional: She appears well-developed and well-nourished. Mildly fatigued but no acute distress. spouse at side HENT: Head: Normocephalic and atraumatic. Ears: B TMs ok, no erythema or effusion; Nose: swollen turbinates, thick DC. Mouth/Throat: Oropharynx is red but clear and moist. No oropharyngeal exudate.  Eyes: Conjunctivae and EOM are normal. Pupils are equal, round, and reactive to light. No scleral icterus.  Neck: Normal range of motion. Neck supple. No LAD or JVD present. No thyromegaly present.  Cardiovascular: Normal rate, regular rhythm and normal heart sounds.  No murmur heard. No BLE edema. Pulmonary/Chest: Effort normal. Scattered rhonchi but no respiratory distress. She has no wheezes.  Neurological: She is alert and oriented to person, place, and time. No cranial nerve deficit. Coordination, balance, strength, speech and gait are normal.  Skin: Skin is warm and dry. No rash noted. No erythema.  Psychiatric: She has a normal mood and affect. Her behavior is normal. Judgment and thought content normal.   Lab Results  Component Value Date   WBC 9.1 12/21/2012   HGB 12.9 12/21/2012   HCT 38.1 12/21/2012   PLT 279.0 12/21/2012   GLUCOSE 93 12/21/2012   CHOL 153  12/21/2012   TRIG 279.0* 12/21/2012   HDL 40.10 12/21/2012   LDLDIRECT 69.4 12/21/2012   LDLCALC 55 12/12/2008   ALT 8 12/21/2012   AST 24 12/21/2012   NA 139 12/21/2012   K 4.0 12/21/2012   CL 104 12/21/2012   CREATININE 0.9 12/21/2012   BUN 16 12/21/2012   CO2 27 12/21/2012   TSH 0.96 12/13/2011   INR 1.02 08/22/2012   HGBA1C 5.8 12/21/2012   MICROALBUR 0.6 12/13/2011       Assessment & Plan:   Cough, early bronchitis - given comorbid disease and history of pneumonia, will treat with empiric antibiotics for atypical infection though  explained lack of efficacy for facial and body aches and viral disease  Tussionex -Patient request same, reports tolerance of same in past without adverse reaction despite reported allergy  Patient to call if symptoms worse or unimproved

## 2013-11-26 ENCOUNTER — Other Ambulatory Visit: Payer: Self-pay | Admitting: Internal Medicine

## 2013-12-05 ENCOUNTER — Other Ambulatory Visit: Payer: Self-pay | Admitting: *Deleted

## 2013-12-05 MED ORDER — SIMVASTATIN 40 MG PO TABS: 40.0000 mg | ORAL_TABLET | Freq: Every day | ORAL | Status: DC

## 2013-12-05 MED ORDER — FUROSEMIDE 40 MG PO TABS: 40.0000 mg | ORAL_TABLET | Freq: Every day | ORAL | Status: DC

## 2013-12-05 MED ORDER — MELOXICAM 15 MG PO TABS: 15.0000 mg | ORAL_TABLET | Freq: Every day | ORAL | Status: DC

## 2013-12-05 MED ORDER — BENAZEPRIL HCL 40 MG PO TABS: 40.0000 mg | ORAL_TABLET | Freq: Every day | ORAL | Status: DC

## 2013-12-05 NOTE — Telephone Encounter (Signed)
Done erx 

## 2013-12-10 ENCOUNTER — Ambulatory Visit
Admission: RE | Admit: 2013-12-10 | Discharge: 2013-12-10 | Disposition: A | Discharge status: Home / Self Care | Payer: Medicare Other | Source: Ambulatory Visit

## 2013-12-10 DIAGNOSIS — Z1231 Encounter for screening mammogram for malignant neoplasm of breast: Secondary | ICD-10-CM

## 2013-12-10 LAB — HM MAMMOGRAPHY

## 2013-12-11 ENCOUNTER — Encounter: Payer: Self-pay | Admitting: Internal Medicine

## 2013-12-11 ENCOUNTER — Ambulatory Visit (INDEPENDENT_AMBULATORY_CARE_PROVIDER_SITE_OTHER): Payer: Medicare Other | Admitting: Internal Medicine

## 2013-12-11 VITALS — BP 120/82 | HR 78 | Temp 98.0°F | Ht 64.0 in | Wt 148.1 lb

## 2013-12-11 DIAGNOSIS — J209 Acute bronchitis, unspecified: Secondary | ICD-10-CM

## 2013-12-11 DIAGNOSIS — I10 Essential (primary) hypertension: Secondary | ICD-10-CM

## 2013-12-11 DIAGNOSIS — E119 Type 2 diabetes mellitus without complications: Secondary | ICD-10-CM | POA: Diagnosis not present

## 2013-12-11 DIAGNOSIS — J441 Chronic obstructive pulmonary disease with (acute) exacerbation: Secondary | ICD-10-CM | POA: Diagnosis not present

## 2013-12-11 MED ORDER — VENLAFAXINE HCL ER 150 MG PO CP24: 150.0000 mg | ORAL_CAPSULE | Freq: Every day | ORAL | Status: DC

## 2013-12-11 MED ORDER — PREDNISONE 10 MG PO TABS: ORAL_TABLET | ORAL | Status: DC

## 2013-12-11 MED ORDER — OMEPRAZOLE 20 MG PO CPDR: DELAYED_RELEASE_CAPSULE | ORAL | Status: DC

## 2013-12-11 MED ORDER — BENAZEPRIL HCL 40 MG PO TABS: 40.0000 mg | ORAL_TABLET | Freq: Every day | ORAL | Status: DC

## 2013-12-11 MED ORDER — SIMVASTATIN 40 MG PO TABS: 40.0000 mg | ORAL_TABLET | Freq: Every day | ORAL | Status: DC

## 2013-12-11 MED ORDER — MELOXICAM 15 MG PO TABS: 15.0000 mg | ORAL_TABLET | Freq: Every day | ORAL | Status: DC

## 2013-12-11 MED ORDER — HYDROCODONE-HOMATROPINE 5-1.5 MG/5ML PO SYRP: 5.0000 mL | ORAL_SOLUTION | Freq: Four times a day (QID) | ORAL | Status: DC | PRN

## 2013-12-11 MED ORDER — LEVOFLOXACIN 250 MG PO TABS: 250.0000 mg | ORAL_TABLET | Freq: Every day | ORAL | Status: DC

## 2013-12-11 MED ORDER — FUROSEMIDE 40 MG PO TABS: 40.0000 mg | ORAL_TABLET | Freq: Every day | ORAL | Status: DC

## 2013-12-11 MED ORDER — ATENOLOL 50 MG PO TABS: 50.0000 mg | ORAL_TABLET | Freq: Every day | ORAL | Status: DC

## 2013-12-11 MED ORDER — ALENDRONATE SODIUM 70 MG PO TABS: 70.0000 mg | ORAL_TABLET | ORAL | Status: DC

## 2013-12-11 NOTE — Assessment & Plan Note (Signed)
Mild to mod, for predpack asd,  to f/u any worsening symptoms or concerns 

## 2013-12-11 NOTE — Assessment & Plan Note (Signed)
stable overall by history and exam, recent data reviewed with pt, and pt to continue medical treatment as before,  to f/u any worsening symptoms or concerns Lab Results  Component Value Date   HGBA1C 5.8 12/21/2012   To f/u with any onset polys with steroid tx, or Cbg > 200

## 2013-12-11 NOTE — Assessment & Plan Note (Signed)
stable overall by history and exam, recent data reviewed with pt, and pt to continue medical treatment as before,  to f/u any worsening symptoms or concerns BP Readings from Last 3 Encounters:  12/11/13 120/82  11/23/13 112/72  11/05/13 128/84

## 2013-12-11 NOTE — Progress Notes (Signed)
Subjective:    Patient ID: Tara Neal, female    DOB: [birth year], 73 y.o.   MRN: [id number]  HPI  Here with acute onset mild to mod 2-3 days after 4 wks illness overall -  ST, HA, general weakness and malaise, with prod cough greenish sputum, but Pt denies chest pain, increased sob or doe, wheezing, orthopnea, PND, increased LE swelling, palpitations, dizziness or syncope, with few wheezes.  Pt denies polydipsia, polyuria Past Medical History  Diagnosis Date  . Hyperlipidemia   . Hypertension   . Mild obesity   . Spontaneous pneumothorax     1968  . GERD (gastroesophageal reflux disease)   . Osteoarthritis   . Low back pain   . Osteoporosis   . Hormone replacement therapy (postmenopausal)   . Anxiety   . Osteopenia   . COPD (chronic obstructive pulmonary disease)   . Diverticulosis of colon   . Shortness of breath     with exertion   . Anemia     hx of   . Chronic insomnia 04/22/2012  . Scoliosis   . Pneumonia     hx of 5 years ago   . Migraine headache     denies  . Umbilical hernia 2/45/8099   Past Surgical History  Procedure Laterality Date  . Tonsillectomy    . Lumbar disc surgery       x 2  . Other surgical history      benign growth removed from right under ear at age 20   . Excision/release bursa hip  04/26/2012    Procedure: EXCISION/RELEASE BURSA HIP;  Surgeon: Gearlean Alf, MD;  Location: [facility];  Service: Orthopedics;  Laterality: Right;  . Hip adductor tenotomy    . Eye surgery      - bil and Retina surgery to left eye  . Tonsillectomy    . Hernia repair  83/38/2505    umbilical hernia    reports that she quit smoking about 32 years ago. Her smoking use included Cigarettes. She smoked 0.00 packs per day for 20 years. She has never used smokeless tobacco. She reports that she drinks about 1.8 ounces of alcohol per week. She reports that she does not use illicit drugs. family history includes Diabetes in her mother; Heart disease in her mother;  Hyperlipidemia in her mother. Allergies  Allergen Reactions  . Codeine     REACTION: Nausea  . Penicillins     REACTION: Rash  . Sulfonamide Derivatives     REACTION: Nausea  . Percocet [Oxycodone-Acetaminophen] Itching   Current Outpatient Prescriptions on File Prior to Visit  Medication Sig Dispense Refill  . calcium carbonate (OS-CAL) 600 MG TABS Take 600 mg by mouth 2 (two) times daily with a meal.      . cetirizine (ZYRTEC) 10 MG tablet Take 10 mg by mouth daily with breakfast.       . chlorpheniramine-HYDROcodone (TUSSIONEX PENNKINETIC ER) 10-8 MG/5ML LQCR Take 5 mLs by mouth every 12 (twelve) hours as needed.  140 mL  0  . Cholecalciferol (VITAMIN D3) 1000 UNITS CAPS Take 1 capsule by mouth daily.      Marland Kitchen HYDROcodone-acetaminophen (NORCO/VICODIN) 5-325 MG per tablet Take 1 tablet by mouth every 6 (six) hours as needed. 1 tab by mouth every 6 hrs as needed for pain  60 tablet  1  . latanoprost (XALATAN) 0.005 % ophthalmic solution       . methocarbamol (ROBAXIN) 500 MG tablet Take 500 mg  by mouth 4 (four) times daily.      . Multiple Vitamin (MULTIVITAMIN) tablet Take 1 tablet by mouth daily.      Marland Kitchen oxyCODONE-acetaminophen (ROXICET) 5-325 MG per tablet Take 1-2 tablets by mouth every 4 (four) hours as needed (Given at discharge from [facility]).  30 tablet  0   No current facility-administered medications on file prior to visit.   Review of Systems  Constitutional: Negative for unexpected weight change, or unusual diaphoresis  HENT: Negative for tinnitus.   Eyes: Negative for photophobia and visual disturbance.  Respiratory: Negative for choking and stridor.   Gastrointestinal: Negative for vomiting and blood in stool.  Genitourinary: Negative for hematuria and decreased urine volume.  Musculoskeletal: Negative for acute joint swelling Skin: Negative for color change and wound.  Neurological: Negative for tremors and numbness other than noted  Psychiatric/Behavioral: Negative for  decreased concentration or  hyperactivity.       Objective:   Physical Exam BP 120/82  Pulse 78  Temp(Src) 98 F (36.7 C) (Oral)  Ht 5\' 4"  (1.626 m)  Wt 148 lb 2 oz (67.189 kg)  BMI 25.41 kg/m2  SpO2 96% VS noted, mild ill Constitutional: Pt appears well-developed and well-nourished.  HENT: Head: NCAT.  Right Ear: External ear normal.  Left Ear: External ear normal.  Bilat tm's with mild erythema.  Max sinus areas non tender.  Pharynx with mild erythema, no exudate Eyes: Conjunctivae and EOM are normal. Pupils are equal, round, and reactive to light.  Neck: Normal range of motion. Neck supple.  Cardiovascular: Normal rate and regular rhythm.   Pulmonary/Chest: Effort normal and breath sounds decr bilat with few wheezes bilat.  Neurological: Pt is alert. Not confused  Skin: Skin is warm. No erythema.  Psychiatric: Pt behavior is normal. Thought content normal.     Assessment & Plan:

## 2013-12-11 NOTE — Progress Notes (Signed)
Pre-visit discussion using our clinic review tool. No additional management support is needed unless otherwise documented below in the visit note.  

## 2013-12-11 NOTE — Patient Instructions (Signed)
Please take all new medication as prescribed - the antibiotic, cough med, and low dose prednisone for just a few days Please continue all other medications as before, and refills have been done if requested. Please have the pharmacy call with any other refills you may need.  Please remember to sign up for My Chart if you have not done so, as this will be important to you in the future with finding out test results, communicating by private email, and scheduling acute appointments online when needed.

## 2013-12-11 NOTE — Assessment & Plan Note (Signed)
Mild to mod, for antibx course,  to f/u any worsening symptoms or concerns 

## 2013-12-12 ENCOUNTER — Telehealth: Payer: Self-pay | Admitting: Internal Medicine

## 2013-12-12 NOTE — Telephone Encounter (Signed)
Relevant patient education assigned to patient using Emmi. ° °

## 2014-01-18 ENCOUNTER — Encounter: Payer: Self-pay | Admitting: Gastroenterology

## 2014-02-01 ENCOUNTER — Other Ambulatory Visit: Payer: Self-pay

## 2014-02-01 MED ORDER — BENAZEPRIL HCL 40 MG PO TABS: 40.0000 mg | ORAL_TABLET | Freq: Every day | ORAL | Status: DC

## 2014-02-01 MED ORDER — SIMVASTATIN 40 MG PO TABS: 40.0000 mg | ORAL_TABLET | Freq: Every day | ORAL | Status: DC

## 2014-02-01 MED ORDER — FUROSEMIDE 40 MG PO TABS: 40.0000 mg | ORAL_TABLET | Freq: Every day | ORAL | Status: DC

## 2014-02-01 MED ORDER — MELOXICAM 15 MG PO TABS: 15.0000 mg | ORAL_TABLET | Freq: Every day | ORAL | Status: DC

## 2014-02-01 NOTE — Addendum Note (Signed)
Addended by: Sharon Seller B on: 02/01/2014 01:19 PM   Modules accepted: Orders

## 2014-02-16 ENCOUNTER — Encounter (HOSPITAL_COMMUNITY): Payer: Self-pay | Admitting: Emergency Medicine

## 2014-02-16 ENCOUNTER — Emergency Department (HOSPITAL_COMMUNITY): Payer: Medicare Other

## 2014-02-16 ENCOUNTER — Emergency Department (HOSPITAL_COMMUNITY)
Admission: EM | Admit: 2014-02-16 | Discharge: 2014-02-16 | Disposition: A | Discharge status: Home / Self Care | Payer: Medicare Other | Attending: Emergency Medicine | Admitting: Emergency Medicine

## 2014-02-16 DIAGNOSIS — Z880 Allergy status to penicillin: Secondary | ICD-10-CM | POA: Insufficient documentation

## 2014-02-16 DIAGNOSIS — R609 Edema, unspecified: Secondary | ICD-10-CM | POA: Diagnosis not present

## 2014-02-16 DIAGNOSIS — F411 Generalized anxiety disorder: Secondary | ICD-10-CM | POA: Insufficient documentation

## 2014-02-16 DIAGNOSIS — S59909A Unspecified injury of unspecified elbow, initial encounter: Secondary | ICD-10-CM | POA: Diagnosis not present

## 2014-02-16 DIAGNOSIS — S1093XA Contusion of unspecified part of neck, initial encounter: Principal | ICD-10-CM

## 2014-02-16 DIAGNOSIS — M1990 Unspecified osteoarthritis, unspecified site: Secondary | ICD-10-CM | POA: Diagnosis not present

## 2014-02-16 DIAGNOSIS — I10 Essential (primary) hypertension: Secondary | ICD-10-CM | POA: Diagnosis not present

## 2014-02-16 DIAGNOSIS — Z79899 Other long term (current) drug therapy: Secondary | ICD-10-CM | POA: Diagnosis not present

## 2014-02-16 DIAGNOSIS — M4120 Other idiopathic scoliosis, site unspecified: Secondary | ICD-10-CM | POA: Diagnosis not present

## 2014-02-16 DIAGNOSIS — IMO0002 Reserved for concepts with insufficient information to code with codable children: Secondary | ICD-10-CM | POA: Diagnosis not present

## 2014-02-16 DIAGNOSIS — M25539 Pain in unspecified wrist: Secondary | ICD-10-CM | POA: Insufficient documentation

## 2014-02-16 DIAGNOSIS — Z87891 Personal history of nicotine dependence: Secondary | ICD-10-CM | POA: Insufficient documentation

## 2014-02-16 DIAGNOSIS — K219 Gastro-esophageal reflux disease without esophagitis: Secondary | ICD-10-CM | POA: Diagnosis not present

## 2014-02-16 DIAGNOSIS — W010XXA Fall on same level from slipping, tripping and stumbling without subsequent striking against object, initial encounter: Secondary | ICD-10-CM | POA: Insufficient documentation

## 2014-02-16 DIAGNOSIS — S199XXA Unspecified injury of neck, initial encounter: Secondary | ICD-10-CM | POA: Diagnosis not present

## 2014-02-16 DIAGNOSIS — S0003XA Contusion of scalp, initial encounter: Secondary | ICD-10-CM | POA: Insufficient documentation

## 2014-02-16 DIAGNOSIS — J449 Chronic obstructive pulmonary disease, unspecified: Secondary | ICD-10-CM | POA: Diagnosis not present

## 2014-02-16 DIAGNOSIS — M810 Age-related osteoporosis without current pathological fracture: Secondary | ICD-10-CM | POA: Diagnosis not present

## 2014-02-16 DIAGNOSIS — Y9389 Activity, other specified: Secondary | ICD-10-CM | POA: Insufficient documentation

## 2014-02-16 DIAGNOSIS — Y929 Unspecified place or not applicable: Secondary | ICD-10-CM | POA: Insufficient documentation

## 2014-02-16 DIAGNOSIS — E785 Hyperlipidemia, unspecified: Secondary | ICD-10-CM | POA: Diagnosis not present

## 2014-02-16 DIAGNOSIS — W19XXXA Unspecified fall, initial encounter: Secondary | ICD-10-CM

## 2014-02-16 DIAGNOSIS — J4489 Other specified chronic obstructive pulmonary disease: Secondary | ICD-10-CM | POA: Insufficient documentation

## 2014-02-16 DIAGNOSIS — S0993XA Unspecified injury of face, initial encounter: Secondary | ICD-10-CM | POA: Diagnosis not present

## 2014-02-16 DIAGNOSIS — S59919A Unspecified injury of unspecified forearm, initial encounter: Secondary | ICD-10-CM | POA: Diagnosis not present

## 2014-02-16 DIAGNOSIS — M25569 Pain in unspecified knee: Secondary | ICD-10-CM | POA: Diagnosis not present

## 2014-02-16 DIAGNOSIS — S0083XA Contusion of other part of head, initial encounter: Principal | ICD-10-CM

## 2014-02-16 NOTE — ED Provider Notes (Signed)
CSN: [id number]     Arrival date & time 02/16/14  1336 History  This chart was scribed for non-physician practitioner Margarita Mail working with Hoy Morn, MD by Donato Schultz, ED Scribe. This patient was seen in room WTR5/WTR5 and the patient's care was started at 2:30 PM.     Chief Complaint  Patient presents with  . Fall  . Facial Injury  . Wrist Pain  . Knee Pain     Patient is a 73 y.o. female presenting with fall, facial injury, wrist pain, and knee pain. The history is provided by the patient and the spouse. No language interpreter was used.  Fall Pertinent negatives include no chest pain and no abdominal pain.  Facial Injury Wrist Pain Pertinent negatives include no chest pain and no abdominal pain.  Knee Pain  HPI Comments: Tara Neal is a 73 y.o. female who presents to the Emergency Department complaining of abrasions on the bridge of her nose and right knee and pain to her left wrist that started today at 1:30 PM after the patient tripped over a pipe and fell onto gravel.  She denies chest pain, abdominal pain, and epistaxis as associated symptoms.  The patient's husband states that the patient broke left wrist in September 2014.  She states that she had a fracture repair surgery performed on the left wrist by Dr. Loney Loh but did not have any hardware placed in her wrist during the surgery.   She denies taking anticoagulants.  The patient states that she has a history of osteoporosis.    Past Medical History  Diagnosis Date  . Hyperlipidemia   . Hypertension   . Mild obesity   . Spontaneous pneumothorax     1968  . GERD (gastroesophageal reflux disease)   . Osteoarthritis   . Low back pain   . Osteoporosis   . Hormone replacement therapy (postmenopausal)   . Anxiety   . Osteopenia   . COPD (chronic obstructive pulmonary disease)   . Diverticulosis of colon   . Shortness of breath     with exertion   . Anemia     hx of   . Chronic insomnia 04/22/2012  .  Scoliosis   . Pneumonia     hx of 5 years ago   . Migraine headache     denies  . Umbilical hernia 2/48/2500   Past Surgical History  Procedure Laterality Date  . Tonsillectomy    . Lumbar disc surgery       x 2  . Other surgical history      benign growth removed from right under ear at age 48   . Excision/release bursa hip  04/26/2012    Procedure: EXCISION/RELEASE BURSA HIP;  Surgeon: Gearlean Alf, MD;  Location: [facility];  Service: Orthopedics;  Laterality: Right;  . Hip adductor tenotomy    . Eye surgery      - bil and Retina surgery to left eye  . Tonsillectomy    . Hernia repair  37/02/8888    umbilical hernia   Family History  Problem Relation Age of Onset  . Hyperlipidemia Mother   . Heart disease Mother   . Diabetes Mother    History  Substance Use Topics  . Smoking status: Former Smoker -- 20 years    Types: Cigarettes    Quit date: 11/29/1981  . Smokeless tobacco: Never Used  . Alcohol Use: 1.8 oz/week    3 Glasses of wine per week  OB History   Grav Para Term Preterm Abortions TAB SAB Ect Mult Living                 Review of Systems  Cardiovascular: Negative for chest pain.  Gastrointestinal: Negative for abdominal pain.  Skin: Positive for wound (bridge of nose and right knee).  All other systems reviewed and are negative.      Allergies  Codeine; Penicillins; Sulfonamide derivatives; and Percocet  Home Medications   Current Outpatient Rx  Name  Route  Sig  Dispense  Refill  . alendronate (FOSAMAX) 70 MG tablet   Oral   Take 1 tablet (70 mg total) by mouth every 7 (seven) days. On Saturdays. Take with a full glass of water on an empty stomach.   12 tablet   3   . Ascorbic Acid (VITAMIN C) 1000 MG tablet   Oral   Take 1,000 mg by mouth daily.         Marland Kitchen atenolol (TENORMIN) 50 MG tablet   Oral   Take 1 tablet (50 mg total) by mouth daily.   90 tablet   3   . benazepril (LOTENSIN) 40 MG tablet   Oral   Take 1 tablet (40 mg  total) by mouth daily with breakfast.   90 tablet   3   . calcium carbonate (OS-CAL) 600 MG TABS   Oral   Take 600 mg by mouth 2 (two) times daily with a meal.         . cetirizine (ZYRTEC) 10 MG tablet   Oral   Take 10 mg by mouth daily with breakfast.          . Cholecalciferol (VITAMIN D3) 1000 UNITS CAPS   Oral   Take 1 capsule by mouth daily.         . furosemide (LASIX) 40 MG tablet   Oral   Take 40 mg by mouth.         . latanoprost (XALATAN) 0.005 % ophthalmic solution   Both Eyes   Place 1 drop into both eyes at bedtime.          . meloxicam (MOBIC) 15 MG tablet   Oral   Take 15 mg by mouth daily.         . methocarbamol (ROBAXIN) 500 MG tablet   Oral   Take 500 mg by mouth every 6 (six) hours as needed for muscle spasms.          . Multiple Vitamin (MULTIVITAMIN) tablet   Oral   Take 1 tablet by mouth daily.         Marland Kitchen omeprazole (PRILOSEC) 20 MG capsule   Oral   Take 20 mg by mouth daily.         . simvastatin (ZOCOR) 40 MG tablet   Oral   Take 40 mg by mouth daily.         Marland Kitchen venlafaxine XR (EFFEXOR-XR) 150 MG 24 hr capsule   Oral   Take 150 mg by mouth 2 (two) times daily.          Triage Vitals: BP 165/63  Pulse 67  Temp(Src) 98.6 F (37 C) (Oral)  Resp 18  SpO2 100%  Physical Exam  Nursing note and vitals reviewed. Constitutional: She is oriented to person, place, and time. She appears well-developed and well-nourished. No distress.  HENT:  Head: Normocephalic and atraumatic.  Eyes: EOM are normal.  Neck: Neck supple. No tracheal deviation present.  Cardiovascular: Normal rate.   Pulmonary/Chest: Effort normal. No respiratory distress.  Musculoskeletal: Normal range of motion.  Clear deformity of the left wrist with swelling.  Full range of motion of the right knee.  No crepitus.    Neurological: She is alert and oriented to person, place, and time.  Skin: Skin is warm and dry.  3 cm hematoma over the right  eyebrow.  Punctate center of skin break.  Multiple superficial abrasion of the nose with areas of skin loss.  Bleeding is well controlled.    Psychiatric: She has a normal mood and affect. Her behavior is normal.    ED Course  Procedures (including critical care time)  DIAGNOSTIC STUDIES: Oxygen Saturation is 100% on room air, normal by my interpretation.    COORDINATION OF CARE: 2:35 PM- Discussed obtaining an x-ray of the patient's left wrist and nose and administering Motrin in the ED.  Discussed   The patient agreed to the treatment plan.    Labs Review Labs Reviewed - No data to display Imaging Review No results found.   EKG Interpretation None      MDM   Final diagnoses:  Fall    Patient X-Ray/CT negative for obvious fracture or dislocation. No neurologic or ocular disturbances. Wounds cleansed and TDAP updated. Pain managed in ED. Pt advised to follow up with Dr.Gramig for her wrist.and PCP if symptoms persist for possibility of missed fracture diagnosis. Patient given brace while in ED, conservative therapy recommended and discussed. Patient will be dc home & is agreeable with above plan.   I personally performed the services described in this documentation, which was scribed in my presence. The recorded information has been reviewed and is accurate.    Margarita Mail, PA-C 02/17/14 2119

## 2014-02-16 NOTE — Discharge Instructions (Signed)
Head Injury, Adult °You have received a head injury. It does not appear serious at this time. Headaches and vomiting are common following head injury. It should be easy to awaken from sleeping. Sometimes it is necessary for you to stay in the emergency department for a while for observation. Sometimes admission to the hospital may be needed. After injuries such as yours, most problems occur within the first 24 hours, but side effects may occur up to 7 10 days after the injury. It is important for you to carefully monitor your condition and contact your health care provider or seek immediate medical care if there is a change in your condition. °WHAT ARE THE TYPES OF HEAD INJURIES? °Head injuries can be as minor as a bump. Some head injuries can be more severe. More severe head injuries include: °· A jarring injury to the brain (concussion). °· A bruise of the brain (contusion). This mean there is bleeding in the brain that can cause swelling. °· A cracked skull (skull fracture). °· Bleeding in the brain that collects, clots, and forms a bump (hematoma). °WHAT CAUSES A HEAD INJURY? °A serious head injury is most likely to happen to someone who is in a car wreck and is not wearing a seat belt. Other causes of major head injuries include bicycle or motorcycle accidents, sports injuries, and falls. °HOW ARE HEAD INJURIES DIAGNOSED? °A complete history of the event leading to the injury and your current symptoms will be helpful in diagnosing head injuries. Many times, pictures of the brain, such as CT or MRI are needed to see the extent of the injury. Often, an overnight hospital stay is necessary for observation.  °WHEN SHOULD I SEEK IMMEDIATE MEDICAL CARE?  °You should get help right away if: °· You have confusion or drowsiness. °· You feel sick to your stomach (nauseous) or have continued, forceful vomiting. °· You have dizziness or unsteadiness that is getting worse. °· You have severe, continued headaches not  relieved by medicine. Only take over-the-counter or prescription medicines for pain, fever, or discomfort as directed by your health care provider. °· You do not have normal function of the arms or legs or are unable to walk. °· You notice changes in the black spots in the center of the colored part of your eye (pupil). °· You have a clear or bloody fluid coming from your nose or ears. °· You have a loss of vision. °During the next 24 hours after the injury, you must stay with someone who can watch you for the warning signs. This person should contact local emergency services (911 in the [place] if you have seizures, you become unconscious, or you are unable to wake up. °HOW CAN I PREVENT A HEAD INJURY IN THE FUTURE? °The most important factor for preventing major head injuries is avoiding motor vehicle accidents.  To minimize the potential for damage to your head, it is crucial to wear seat belts while riding in motor vehicles. Wearing helmets while bike riding and playing collision sports (like football) is also helpful. Also, avoiding dangerous activities around the house will further help reduce your risk of head injury.  °WHEN CAN I RETURN TO NORMAL ACTIVITIES AND ATHLETICS? °You should be reevaluated by your health care provider before returning to these activities. If you have any of the following symptoms, you should not return to activities or contact sports until 1 week after the symptoms have stopped: °· Persistent headache. °· Dizziness or vertigo. °· Poor attention and concentration. °·   Confusion.  Memory problems.  Nausea or vomiting.  Fatigue or tire easily.  Irritability.  Intolerant of bright lights or loud noises.  Anxiety or depression.  Disturbed sleep. MAKE SURE YOU:   Understand these instructions.  Will watch your condition.  Will get help right away if you are not doing well or get worse. Document Released: 11/15/2005 Document Revised: 09/05/2013 Document Reviewed:  07/23/2013 [facility] Patient Information 2014 [place]. Hematoma A hematoma is a collection of blood under the skin, in an organ, in a body space, in a joint space, or in other tissue. The blood can clot to form a lump that you can see and feel. The lump is often firm and may sometimes become sore and tender. Most hematomas get better in a few days to weeks. However, some hematomas may be serious and require medical care. Hematomas can range in size from very small to very large. CAUSES  A hematoma can be caused by a blunt or penetrating injury. It can also be caused by spontaneous leakage from a blood vessel under the skin. Spontaneous leakage from a blood vessel is more likely to occur in older people, especially those taking blood thinners. Sometimes, a hematoma can develop after certain medical procedures. SIGNS AND SYMPTOMS   A firm lump on the body.  Possible pain and tenderness in the area.  Bruising.Blue, dark blue, purple-red, or yellowish skin may appear at the site of the hematoma if the hematoma is close to the surface of the skin. For hematomas in deeper tissues or body spaces, the signs and symptoms may be subtle. For example, an intra-abdominal hematoma may cause abdominal pain, weakness, fainting, and shortness of breath. An intracranial hematoma may cause a headache or symptoms such as weakness, trouble speaking, or a change in consciousness. DIAGNOSIS  A hematoma can usually be diagnosed based on your medical history and a physical exam. Imaging tests may be needed if your health care provider suspects a hematoma in deeper tissues or body spaces, such as the abdomen, head, or chest. These tests may include ultrasonography or a CT scan.  TREATMENT  Hematomas usually go away on their own over time. Rarely does the blood need to be drained out of the body. Large hematomas or those that may affect vital organs will sometimes need surgical drainage or monitoring. HOME CARE  INSTRUCTIONS   Apply ice to the injured area:   Put ice in a plastic bag.   Place a towel between your skin and the bag.   Leave the ice on for 20 minutes, 2 3 times a day for the first 1 to 2 days.   After the first 2 days, switch to using warm compresses on the hematoma.   Elevate the injured area to help decrease pain and swelling. Wrapping the area with an elastic bandage may also be helpful. Compression helps to reduce swelling and promotes shrinking of the hematoma. Make sure the bandage is not wrapped too tight.   If your hematoma is on a lower extremity and is painful, crutches may be helpful for a couple days.   Only take over-the-counter or prescription medicines as directed by your health care provider. SEEK IMMEDIATE MEDICAL CARE IF:   You have increasing pain, or your pain is not controlled with medicine.   You have a fever.   You have worsening swelling or discoloration.   Your skin over the hematoma breaks or starts bleeding.   Your hematoma is in your chest or abdomen  and you have weakness, shortness of breath, or a change in consciousness.  Your hematoma is on your scalp (caused by a fall or injury) and you have a worsening headache or a change in alertness or consciousness. MAKE SURE YOU:   Understand these instructions.  Will watch your condition.  Will get help right away if you are not doing well or get worse. Document Released: 06/29/2004 Document Revised: 07/18/2013 Document Reviewed: 04/25/2013 [facility] Patient Information 2014 [place]. Fall Prevention and Home Safety Falls cause injuries and can affect all age groups. It is possible to use preventive measures to significantly decrease the likelihood of falls. There are many simple measures which can make your home safer and prevent falls. OUTDOORS  Repair cracks and edges of walkways and driveways.  Remove high doorway thresholds.  Trim shrubbery on the main path into your  home.  Have good outside lighting.  Clear walkways of tools, rocks, debris, and clutter.  Check that handrails are not broken and are securely fastened. Both sides of steps should have handrails.  Have leaves, snow, and ice cleared regularly.  Use sand or salt on walkways during winter months.  In the garage, clean up grease or oil spills. BATHROOM  Install night lights.  Install grab bars by the toilet and in the tub and shower.  Use non-skid mats or decals in the tub or shower.  Place a plastic non-slip stool in the shower to sit on, if needed.  Keep floors dry and clean up all water on the floor immediately.  Remove soap buildup in the tub or shower on a regular basis.  Secure bath mats with non-slip, double-sided rug tape.  Remove throw rugs and tripping hazards from the floors. BEDROOMS  Install night lights.  Make sure a bedside light is easy to reach.  Do not use oversized bedding.  Keep a telephone by your bedside.  Have a firm chair with side arms to use for getting dressed.  Remove throw rugs and tripping hazards from the floor. KITCHEN  Keep handles on pots and pans turned toward the center of the stove. Use back burners when possible.  Clean up spills quickly and allow time for drying.  Avoid walking on wet floors.  Avoid hot utensils and knives.  Position shelves so they are not too high or low.  Place commonly used objects within easy reach.  If necessary, use a sturdy step stool with a grab bar when reaching.  Keep electrical cables out of the way.  Do not use floor polish or wax that makes floors slippery. If you must use wax, use non-skid floor wax.  Remove throw rugs and tripping hazards from the floor. STAIRWAYS  Never leave objects on stairs.  Place handrails on both sides of stairways and use them. Fix any loose handrails. Make sure handrails on both sides of the stairways are as long as the stairs.  Check carpeting to make  sure it is firmly attached along stairs. Make repairs to worn or loose carpet promptly.  Avoid placing throw rugs at the top or bottom of stairways, or properly secure the rug with carpet tape to prevent slippage. Get rid of throw rugs, if possible.  Have an electrician put in a light switch at the top and bottom of the stairs. OTHER FALL PREVENTION TIPS  Wear low-heel or rubber-soled shoes that are supportive and fit well. Wear closed toe shoes.  When using a stepladder, make sure it is fully opened and both  spreaders are firmly locked. Do not climb a closed stepladder.  Add color or contrast paint or tape to grab bars and handrails in your home. Place contrasting color strips on first and last steps.  Learn and use mobility aids as needed. Install an electrical emergency response system.  Turn on lights to avoid dark areas. Replace light bulbs that burn out immediately. Get light switches that glow.  Arrange furniture to create clear pathways. Keep furniture in the same place.  Firmly attach carpet with non-skid or double-sided tape.  Eliminate uneven floor surfaces.  Select a carpet pattern that does not visually hide the edge of steps.  Be aware of all pets. OTHER HOME SAFETY TIPS  Set the water temperature for 120 F (48.8 C).  Keep emergency numbers on or near the telephone.  Keep smoke detectors on every level of the home and near sleeping areas. Document Released: 11/05/2002 Document Revised: 05/16/2012 Document Reviewed: 02/04/2012 [facility] Patient Information 2014 [place].

## 2014-02-16 NOTE — ED Notes (Signed)
She states she "tripped over a pipe", falling forward.  She has abrasions at bridge-of-nose area; and c/o pain at left wrist and fright knee areas.  She is in no distress.

## 2014-02-16 NOTE — ED Notes (Signed)
Pt tripped over piping outside while running in the house to grab something, had no loss of consciousness, or dizziness

## 2014-02-19 NOTE — ED Provider Notes (Signed)
Medical screening examination/treatment/procedure(s) were performed by non-physician practitioner and as supervising physician I was immediately available for consultation/collaboration.   EKG Interpretation None        Hoy Morn, MD 02/19/14 [phone]

## 2014-02-20 DIAGNOSIS — M25569 Pain in unspecified knee: Secondary | ICD-10-CM | POA: Diagnosis not present

## 2014-02-20 DIAGNOSIS — M79609 Pain in unspecified limb: Secondary | ICD-10-CM | POA: Diagnosis not present

## 2014-02-25 DIAGNOSIS — Z961 Presence of intraocular lens: Secondary | ICD-10-CM | POA: Diagnosis not present

## 2014-02-25 DIAGNOSIS — H4011X0 Primary open-angle glaucoma, stage unspecified: Secondary | ICD-10-CM | POA: Diagnosis not present

## 2014-02-25 DIAGNOSIS — H409 Unspecified glaucoma: Secondary | ICD-10-CM | POA: Diagnosis not present

## 2014-02-25 DIAGNOSIS — H52209 Unspecified astigmatism, unspecified eye: Secondary | ICD-10-CM | POA: Diagnosis not present

## 2014-03-06 DIAGNOSIS — IMO0001 Reserved for inherently not codable concepts without codable children: Secondary | ICD-10-CM | POA: Diagnosis not present

## 2014-03-06 DIAGNOSIS — M25539 Pain in unspecified wrist: Secondary | ICD-10-CM | POA: Diagnosis not present

## 2014-03-12 ENCOUNTER — Encounter: Payer: Self-pay | Admitting: Internal Medicine

## 2014-03-12 ENCOUNTER — Telehealth: Payer: Self-pay | Admitting: Internal Medicine

## 2014-03-12 ENCOUNTER — Ambulatory Visit (INDEPENDENT_AMBULATORY_CARE_PROVIDER_SITE_OTHER): Payer: Medicare Other | Admitting: Internal Medicine

## 2014-03-12 VITALS — BP 130/70 | HR 59 | Temp 98.2°F | Resp 15 | Wt 148.0 lb

## 2014-03-12 DIAGNOSIS — J069 Acute upper respiratory infection, unspecified: Secondary | ICD-10-CM

## 2014-03-12 DIAGNOSIS — J209 Acute bronchitis, unspecified: Secondary | ICD-10-CM | POA: Diagnosis not present

## 2014-03-12 MED ORDER — BIAXIN XL 500 MG PO TB24: 1000.0000 mg | ORAL_TABLET | Freq: Every day | ORAL | Status: DC

## 2014-03-12 NOTE — Progress Notes (Signed)
Pre visit review using our clinic review tool, if applicable. No additional management support is needed unless otherwise documented below in the visit note. 

## 2014-03-12 NOTE — Patient Instructions (Addendum)
Plain Mucinex (NOT D) for thick secretions ;force NON dairy fluids .   Nasal cleansing in the shower as discussed with lather of mild shampoo.After 10 seconds wash off lather while  exhaling through nostrils. Make sure that all residual soap is removed to prevent irritation.  Flonase OR Nasacort AQ 1 spray in each nostril twice a day as needed. Use the "crossover" technique into opposite nostril spraying toward opposite ear @ 45 degree angle, not straight up into nostril.  Use a Neti pot daily only  as needed for significant sinus congestion; going from open side to congested side . Plain Allegra (NOT D )  160 daily , Loratidine 10 mg , OR Zyrtec 10 mg @ bedtime  as needed for itchy eyes & sneezing. Hold the simvastatin until you've completed the antibiotic; it could raise the level of the statin. Also hold Alendronate while on antibiotic.

## 2014-03-12 NOTE — Progress Notes (Signed)
   Subjective:    Patient ID: Tara Neal, female    DOB: 08-Jun-1941, 73 y.o.   MRN: [id number]  HPI Symptoms began Thursday as clear rhinitis, progressing to frontal, maxillary, and dental pain.  She reports nasal secretions are now yellow-green; coughing up green-yellow sputum, unsure if PND or chest congestion; reports increased dyspnea since symptoms began.  She also reports her L eye has been itching for the last several days, with green discharge from eye this morning. Low-grade fever at home yesterday. She has been taking Tylenol and Mucinex DM at home.  Last abx was January for bronchitis - Levaquin. She has COPD.   Her husband was seen last week for dry cough; he is experiencing yellow sputum production with his symptoms at this time.   PMH significant for glaucoma.   Review of Systems Denies chills, sweats, sore throat, wheezing.     Objective:   Physical Exam General appearance:good health ;well nourished; no acute distress or increased work of breathing is present.  No  lymphadenopathy about the head, neck, or axilla noted.   Eyes: No conjunctival inflammation or lid edema is present. There is no scleral icterus.  Ears:  External ear exam shows no significant lesions or deformities.  Otoscopic examination reveals clear canals, tympanic membranes are intact bilaterally without bulging, retraction, inflammation or discharge.  Nose:  External nasal examination shows no deformity or inflammation. Nasal mucosa are pink and moist without lesions or exudates. No septal dislocation or deviation.No obstruction to airflow.   Oral exam: Dental hygiene is good; lips and gums are healthy appearing.There is no oropharyngeal erythema or exudate noted.   Neck:  No deformities, thyromegaly, masses, or tenderness noted.   Supple with full range of motion without pain.   Heart:  Normal rate and regular rhythm. S1 and S2 normal without gallop, murmur, click, rub or other extra sounds.    Lungs:No wheezes. No increased work of breathing.  Diffuse rales noted in R lower lobe.   Extremities:  No cyanosis, edema, or clubbing  noted    Skin: Warm & dry w/o jaundice or tenting.     Assessment & Plan:  #1 pneumonia v bronchitis ?

## 2014-03-12 NOTE — Telephone Encounter (Signed)
Pt went to [facility] in [place] to pick up the Biaxin.  [facility] does not carry the brand, but carries the generic.  Could she use the generic and can that be called in to the [facility].

## 2014-03-12 NOTE — Telephone Encounter (Signed)
Spoke with the pharmacist advised of MDs message to switch to generic

## 2014-03-12 NOTE — Progress Notes (Signed)
   Subjective:    Patient ID: Tara Neal, female    DOB: [birth year], 73 y.o.   MRN: [id number]  HPI Symptoms began Thursday as clear rhinitis. She reports nasal secretions are now yellow-green; coughing up green-yellow sputum, unsure if PND or chest congestion; reports increased dyspnea since symptoms began. She also complains L eye has been itching for the last several days, with green discharge from eye this morning.  Low-grade fever at home yesterday. She has been taking Tylenol and Mucinex DM at home.  Last abx was January for bronchitis - Levaquin. She has COPD.  Her husband was seen last week for dry cough; he is experiencing yellow sputum production with his symptoms at this time.  PMH significant for glaucoma.   Review of Systems   She denies chills, sweats, sore throat, or wheezing.  She had some frontal headache but this was not persistent and resolved with Tylenol. She is not having frontal headache or facial pain.     Objective:   Physical Exam General appearance:good health ;well nourished; no acute distress or increased work of breathing is present. No lymphadenopathy about the head, neck, or axilla noted.  Eyes: No conjunctival inflammation or lid edema is present. There is no scleral icterus. EOMI ; vision intact with lenses. Minimal scleral erythema w/o discharge Ears: External ear exam shows no significant lesions or deformities. Otoscopic examination reveals clear canals, tympanic membranes are intact bilaterally without bulging, retraction, inflammation or discharge.  Nose: External nasal examination shows no deformity or inflammation. Nasal mucosa are pink and moist without lesions or exudates. No septal dislocation or deviation.No obstruction to airflow. Hyponasal speech.  Oral exam: Dental hygiene is good; lips and gums are healthy appearing.There is no oropharyngeal erythema or exudate noted.  Neck: No deformities, thyromegaly, masses, or tenderness noted. Supple  with full range of motion without pain.  Heart: Normal rate and regular rhythm. S1 and S2 normal without gallop, murmur, click, rub or other extra sounds.  Lungs:No wheezes. No increased work of breathing. Diffuse rales noted in R lower lobe.  Extremities: No cyanosis, edema, or clubbing noted  Skin: Warm & dry w/o jaundice or tenting.          Assessment & Plan:  #1 ? acute bronchitis w/o bronchospasm #2  ? URI, acute Plan: See orders and recommendations

## 2014-03-12 NOTE — Telephone Encounter (Signed)
Generic is fine thanks!

## 2014-03-18 DIAGNOSIS — S62109A Fracture of unspecified carpal bone, unspecified wrist, initial encounter for closed fracture: Secondary | ICD-10-CM | POA: Diagnosis not present

## 2014-03-18 DIAGNOSIS — IMO0001 Reserved for inherently not codable concepts without codable children: Secondary | ICD-10-CM | POA: Diagnosis not present

## 2014-04-01 DIAGNOSIS — H409 Unspecified glaucoma: Secondary | ICD-10-CM | POA: Diagnosis not present

## 2014-04-01 DIAGNOSIS — Z961 Presence of intraocular lens: Secondary | ICD-10-CM | POA: Diagnosis not present

## 2014-04-01 DIAGNOSIS — T887XXA Unspecified adverse effect of drug or medicament, initial encounter: Secondary | ICD-10-CM | POA: Diagnosis not present

## 2014-04-01 DIAGNOSIS — H401290 Low-tension glaucoma, unspecified eye, stage unspecified: Secondary | ICD-10-CM | POA: Diagnosis not present

## 2014-04-03 DIAGNOSIS — M25569 Pain in unspecified knee: Secondary | ICD-10-CM | POA: Diagnosis not present

## 2014-04-03 DIAGNOSIS — S62109A Fracture of unspecified carpal bone, unspecified wrist, initial encounter for closed fracture: Secondary | ICD-10-CM | POA: Diagnosis not present

## 2014-04-04 ENCOUNTER — Encounter: Payer: Self-pay | Admitting: Internal Medicine

## 2014-04-04 ENCOUNTER — Ambulatory Visit (INDEPENDENT_AMBULATORY_CARE_PROVIDER_SITE_OTHER): Payer: Medicare Other | Admitting: Internal Medicine

## 2014-04-04 VITALS — BP 132/80 | HR 58 | Temp 98.7°F | Ht 64.5 in | Wt 144.4 lb

## 2014-04-04 DIAGNOSIS — J449 Chronic obstructive pulmonary disease, unspecified: Secondary | ICD-10-CM | POA: Diagnosis not present

## 2014-04-04 DIAGNOSIS — J209 Acute bronchitis, unspecified: Secondary | ICD-10-CM | POA: Diagnosis not present

## 2014-04-04 DIAGNOSIS — B370 Candidal stomatitis: Secondary | ICD-10-CM | POA: Diagnosis not present

## 2014-04-04 MED ORDER — NYSTATIN 100000 UNIT/ML MT SUSP: 500000.0000 [IU] | Freq: Four times a day (QID) | OROMUCOSAL | Status: DC

## 2014-04-04 NOTE — Assessment & Plan Note (Signed)
Mild to mod, for antibx course,  to f/u any worsening symptoms or concerns 

## 2014-04-04 NOTE — Assessment & Plan Note (Signed)
stable overall by history and exam, recent data reviewed with pt, and pt to continue medical treatment as before,  to f/u any worsening symptoms or concerns SpO2 Readings from Last 3 Encounters:  04/04/14 96%  03/12/14 96%  02/16/14 100%

## 2014-04-04 NOTE — Patient Instructions (Signed)
Please take all new medication as prescribed  Please continue all other medications as before, and refills have been done if requested.  Please have the pharmacy call with any other refills you may need.  Please continue your efforts at being more active, low cholesterol diet, and weight control.    

## 2014-04-04 NOTE — Assessment & Plan Note (Signed)
Recent symptoms resolved,  to f/u any worsening symptoms or concerns

## 2014-04-04 NOTE — Progress Notes (Signed)
Pre visit review using our clinic review tool, if applicable. No additional management support is needed unless otherwise documented below in the visit note. 

## 2014-04-04 NOTE — Progress Notes (Signed)
Subjective:    Patient ID: Tara Neal, female    DOB: December 27, 1940, 73 y.o.   MRN: [id number]  HPI  Here after pt finished biaxin course, with coating to mouth and tongue without pain but unusual for her, mild to mod for several days.   Pt denies fever, wt loss, night sweats, loss of appetite, or other constitutional symptoms  Pt denies chest pain, increased sob or doe, wheezing, orthopnea, PND, increased LE swelling, palpitations, dizziness or syncope.  Pt denies new neurological symptoms such as new headache, or facial or extremity weakness or numbness Past Medical History  Diagnosis Date  . Hyperlipidemia   . Hypertension   . Mild obesity   . Spontaneous pneumothorax     1968  . GERD (gastroesophageal reflux disease)   . Osteoarthritis   . Low back pain   . Osteoporosis   . Hormone replacement therapy (postmenopausal)   . Anxiety   . Osteopenia   . COPD (chronic obstructive pulmonary disease)   . Diverticulosis of colon   . Shortness of breath     with exertion   . Anemia     hx of   . Chronic insomnia 04/22/2012  . Scoliosis   . Pneumonia     hx of 5 years ago   . Migraine headache     denies  . Umbilical hernia 04/07/2584   Past Surgical History  Procedure Laterality Date  . Tonsillectomy    . Lumbar disc surgery       x 2  . Other surgical history      benign growth removed from right under ear at age 14   . Excision/release bursa hip  04/26/2012    Procedure: EXCISION/RELEASE BURSA HIP;  Surgeon: Gearlean Alf, MD;  Location: [facility];  Service: Orthopedics;  Laterality: Right;  . Hip adductor tenotomy    . Eye surgery      - bil and Retina surgery to left eye  . Tonsillectomy    . Hernia repair  27/78/2423    umbilical hernia    reports that she quit smoking about 32 years ago. Her smoking use included Cigarettes. She smoked 0.00 packs per day for 20 years. She has never used smokeless tobacco. She reports that she drinks about 1.8 ounces of alcohol per week.  She reports that she does not use illicit drugs. family history includes Diabetes in her mother; Heart disease in her mother; Hyperlipidemia in her mother. Allergies  Allergen Reactions  . Codeine     REACTION: Nausea  . Penicillins     REACTION: Rash  . Sulfonamide Derivatives     REACTION: Nausea  . Percocet [Oxycodone-Acetaminophen] Itching   Current Outpatient Prescriptions on File Prior to Visit  Medication Sig Dispense Refill  . alendronate (FOSAMAX) 70 MG tablet Take 1 tablet (70 mg total) by mouth every 7 (seven) days. On Saturdays. Take with a full glass of water on an empty stomach.  12 tablet  3  . Ascorbic Acid (VITAMIN C) 1000 MG tablet Take 1,000 mg by mouth daily.      Marland Kitchen atenolol (TENORMIN) 50 MG tablet Take 1 tablet (50 mg total) by mouth daily.  90 tablet  3  . benazepril (LOTENSIN) 40 MG tablet Take 1 tablet (40 mg total) by mouth daily with breakfast.  90 tablet  3  . BIAXIN XL 500 MG 24 hr tablet Take 2 tablets (1,000 mg total) by mouth daily.  14 tablet  0  .  calcium carbonate (OS-CAL) 600 MG TABS Take 600 mg by mouth 2 (two) times daily with a meal.      . cetirizine (ZYRTEC) 10 MG tablet Take 10 mg by mouth daily with breakfast.       . Cholecalciferol (VITAMIN D3) 1000 UNITS CAPS Take 1 capsule by mouth daily.      . furosemide (LASIX) 40 MG tablet Take 40 mg by mouth.      . latanoprost (XALATAN) 0.005 % ophthalmic solution Place 1 drop into both eyes at bedtime.       . meloxicam (MOBIC) 15 MG tablet Take 15 mg by mouth daily.      . methocarbamol (ROBAXIN) 500 MG tablet Take 500 mg by mouth every 6 (six) hours as needed for muscle spasms.       . Multiple Vitamin (MULTIVITAMIN) tablet Take 1 tablet by mouth daily.      Marland Kitchen omeprazole (PRILOSEC) 20 MG capsule Take 20 mg by mouth daily.      . simvastatin (ZOCOR) 40 MG tablet Take 40 mg by mouth daily.      Marland Kitchen venlafaxine XR (EFFEXOR-XR) 150 MG 24 hr capsule Take 150 mg by mouth 2 (two) times daily.       No  current facility-administered medications on file prior to visit.   Review of Systems  Constitutional: Negative for unusual diaphoresis or other sweats  HENT: Negative for ringing in ear Eyes: Negative for double vision or worsening visual disturbance.  Respiratory: Negative for choking and stridor.   Gastrointestinal: Negative for vomiting or other signifcant bowel change Genitourinary: Negative for hematuria or decreased urine volume.  Musculoskeletal: Negative for other MSK pain or swelling Skin: Negative for color change and worsening wound.  Neurological: Negative for tremors and numbness other than noted  Psychiatric/Behavioral: Negative for decreased concentration or agitation other than above       Objective:   Physical Exam BP 132/80  Pulse 58  Temp(Src) 98.7 F (37.1 C) (Oral)  Ht 5' 4.5" (1.638 m)  Wt 144 lb 6 oz (65.488 kg)  BMI 24.41 kg/m2  SpO2 96% VS noted,  Constitutional: Pt appears well-developed, well-nourished.  HENT: Head: NCAT.  Right Ear: External ear normal.  Left Ear: External ear normal.  Mouth with hard palate candida, lesser but on tongue diffuse white rash as well Eyes: . Pupils are equal, round, and reactive to light. Conjunctivae and EOM are normal Neck: Normal range of motion. Neck supple.  Cardiovascular: Normal rate and regular rhythm.   Pulmonary/Chest: Effort normal and breath sounds  - no rales or wheezing.  Neurological: Pt is alert. Not confused , motor grossly intact Skin: Skin is warm. No rash Psychiatric: Pt behavior is normal. No agitation.     Assessment & Plan:

## 2014-04-29 DIAGNOSIS — H409 Unspecified glaucoma: Secondary | ICD-10-CM | POA: Diagnosis not present

## 2014-04-29 DIAGNOSIS — Z961 Presence of intraocular lens: Secondary | ICD-10-CM | POA: Diagnosis not present

## 2014-04-29 DIAGNOSIS — H401290 Low-tension glaucoma, unspecified eye, stage unspecified: Secondary | ICD-10-CM | POA: Diagnosis not present

## 2014-05-01 DIAGNOSIS — S5290XD Unspecified fracture of unspecified forearm, subsequent encounter for closed fracture with routine healing: Secondary | ICD-10-CM | POA: Diagnosis not present

## 2014-06-03 ENCOUNTER — Telehealth: Payer: Self-pay | Admitting: *Deleted

## 2014-06-03 DIAGNOSIS — E119 Type 2 diabetes mellitus without complications: Secondary | ICD-10-CM

## 2014-06-03 NOTE — Telephone Encounter (Signed)
Patient is coming in for follow up DM and hyperlipidemia Lipid, a1c, bmet Diabetic bundle.

## 2014-06-06 ENCOUNTER — Other Ambulatory Visit (INDEPENDENT_AMBULATORY_CARE_PROVIDER_SITE_OTHER): Payer: Medicare Other

## 2014-06-06 DIAGNOSIS — E119 Type 2 diabetes mellitus without complications: Secondary | ICD-10-CM | POA: Diagnosis not present

## 2014-06-06 LAB — BASIC METABOLIC PANEL
BUN: 17 mg/dL (ref 6–23)
CO2: 28 mEq/L (ref 19–32)
Calcium: 9.6 mg/dL (ref 8.4–10.5)
Chloride: 103 mEq/L (ref 96–112)
Creatinine, Ser: 1 mg/dL (ref 0.4–1.2)
GFR: 59.93 mL/min — AB (ref >60.00)
Glucose, Bld: 104 mg/dL — ABNORMAL HIGH (ref 70–99)
POTASSIUM: 4.9 meq/L (ref 3.5–5.1)
SODIUM: 138 meq/L (ref 135–145)

## 2014-06-06 LAB — HEMOGLOBIN A1C: Hgb A1c MFr Bld: 5.8 % (ref 4.6–6.5)

## 2014-06-06 LAB — LIPID PANEL
Cholesterol: 172 mg/dL (ref 0–200)
HDL: 39.3 mg/dL (ref >39.00)
LDL Cholesterol: 47 mg/dL (ref 0–99)
NonHDL: 132.7
TRIGLYCERIDES: 429 mg/dL — AB (ref 0.0–149.0)
Total CHOL/HDL Ratio: 4
VLDL: 85.8 mg/dL — AB (ref 0.0–40.0)

## 2014-06-10 DIAGNOSIS — H409 Unspecified glaucoma: Secondary | ICD-10-CM | POA: Diagnosis not present

## 2014-06-10 DIAGNOSIS — Z961 Presence of intraocular lens: Secondary | ICD-10-CM | POA: Diagnosis not present

## 2014-06-10 DIAGNOSIS — H401290 Low-tension glaucoma, unspecified eye, stage unspecified: Secondary | ICD-10-CM | POA: Diagnosis not present

## 2014-06-10 DIAGNOSIS — H47239 Glaucomatous optic atrophy, unspecified eye: Secondary | ICD-10-CM | POA: Diagnosis not present

## 2014-06-19 ENCOUNTER — Ambulatory Visit (INDEPENDENT_AMBULATORY_CARE_PROVIDER_SITE_OTHER): Payer: Medicare Other | Admitting: Internal Medicine

## 2014-06-19 ENCOUNTER — Encounter: Payer: Self-pay | Admitting: Internal Medicine

## 2014-06-19 VITALS — BP 140/80 | HR 96 | Temp 98.2°F | Ht 64.0 in | Wt 151.2 lb

## 2014-06-19 DIAGNOSIS — E119 Type 2 diabetes mellitus without complications: Secondary | ICD-10-CM | POA: Diagnosis not present

## 2014-06-19 DIAGNOSIS — I10 Essential (primary) hypertension: Secondary | ICD-10-CM

## 2014-06-19 DIAGNOSIS — J449 Chronic obstructive pulmonary disease, unspecified: Secondary | ICD-10-CM

## 2014-06-19 DIAGNOSIS — Z0000 Encounter for general adult medical examination without abnormal findings: Secondary | ICD-10-CM

## 2014-06-19 DIAGNOSIS — E785 Hyperlipidemia, unspecified: Secondary | ICD-10-CM

## 2014-06-19 MED ORDER — ALENDRONATE SODIUM 70 MG PO TABS: 70.0000 mg | ORAL_TABLET | ORAL | Status: DC

## 2014-06-19 NOTE — Progress Notes (Signed)
Pre visit review using our clinic review tool, if applicable. No additional management support is needed unless otherwise documented below in the visit note. 

## 2014-06-19 NOTE — Assessment & Plan Note (Signed)
stable overall by history and exam, recent data reviewed with pt, and pt to continue medical treatment as before,  to f/u any worsening symptoms or concerns le Lab Results  Component Value Date   LDLCALC 47 06/06/2014

## 2014-06-19 NOTE — Patient Instructions (Addendum)
Please continue all other medications as before, and refills have been done if requested.  Please have the pharmacy call with any other refills you may need.  Please continue your efforts at being more active, low cholesterol diet, and weight control.  You are otherwise up to date with prevention measures today.  Please keep your appointments with your specialists as you may have planned  You will be contacted regarding the referral for: colonoscopy  Please return in 6 months, or sooner if needed

## 2014-06-19 NOTE — Progress Notes (Signed)
Subjective:    Patient ID: Tara Neal, female    DOB: [birth year], 73 y.o.   MRN: [id number]  HPI  Here to f/u; overall doing ok,  Pt denies chest pain, increased sob or doe, wheezing, orthopnea, PND, increased LE swelling, palpitations, dizziness or syncope.  Pt denies polydipsia, polyuria, or low sugar symptoms such as weakness or confusion improved with po intake.  Pt denies new neurological symptoms such as new headache, or facial or extremity weakness or numbness.   Pt states overall good compliance with meds, has been trying to follow lower cholesterol, diabetic diet, with wt overall stable,  but little exercise however. Past Medical History  Diagnosis Date  . Hyperlipidemia   . Hypertension   . Mild obesity   . Spontaneous pneumothorax     1968  . GERD (gastroesophageal reflux disease)   . Osteoarthritis   . Low back pain   . Osteoporosis   . Hormone replacement therapy (postmenopausal)   . Anxiety   . Osteopenia   . COPD (chronic obstructive pulmonary disease)   . Diverticulosis of colon   . Shortness of breath     with exertion   . Anemia     hx of   . Chronic insomnia 04/22/2012  . Scoliosis   . Pneumonia     hx of 5 years ago   . Migraine headache     denies  . Umbilical hernia 2/94/7654   Past Surgical History  Procedure Laterality Date  . Tonsillectomy    . Lumbar disc surgery       x 2  . Other surgical history      benign growth removed from right under ear at age 15   . Excision/release bursa hip  04/26/2012    Procedure: EXCISION/RELEASE BURSA HIP;  Surgeon: Gearlean Alf, MD;  Location: [facility];  Service: Orthopedics;  Laterality: Right;  . Hip adductor tenotomy    . Eye surgery      - bil and Retina surgery to left eye  . Tonsillectomy    . Hernia repair  65/01/5464    umbilical hernia    reports that she quit smoking about 32 years ago. Her smoking use included Cigarettes. She smoked 0.00 packs per day for 20 years. She has never used smokeless  tobacco. She reports that she drinks about 1.8 ounces of alcohol per week. She reports that she does not use illicit drugs. family history includes Diabetes in her mother; Heart disease in her mother; Hyperlipidemia in her mother. Allergies  Allergen Reactions  . Codeine     REACTION: Nausea  . Penicillins     REACTION: Rash  . Sulfonamide Derivatives     REACTION: Nausea  . Percocet [Oxycodone-Acetaminophen] Itching   Current Outpatient Prescriptions on File Prior to Visit  Medication Sig Dispense Refill  . Ascorbic Acid (VITAMIN C) 1000 MG tablet Take 1,000 mg by mouth daily.      Marland Kitchen atenolol (TENORMIN) 50 MG tablet Take 1 tablet (50 mg total) by mouth daily.  90 tablet  3  . benazepril (LOTENSIN) 40 MG tablet Take 1 tablet (40 mg total) by mouth daily with breakfast.  90 tablet  3  . BIAXIN XL 500 MG 24 hr tablet Take 2 tablets (1,000 mg total) by mouth daily.  14 tablet  0  . calcium carbonate (OS-CAL) 600 MG TABS Take 600 mg by mouth 2 (two) times daily with a meal.      .  cetirizine (ZYRTEC) 10 MG tablet Take 10 mg by mouth daily with breakfast.       . Cholecalciferol (VITAMIN D3) 1000 UNITS CAPS Take 1 capsule by mouth daily.      . furosemide (LASIX) 40 MG tablet Take 40 mg by mouth.      . latanoprost (XALATAN) 0.005 % ophthalmic solution Place 1 drop into both eyes at bedtime.       . meloxicam (MOBIC) 15 MG tablet Take 15 mg by mouth daily.      . methocarbamol (ROBAXIN) 500 MG tablet Take 500 mg by mouth every 6 (six) hours as needed for muscle spasms.       . Multiple Vitamin (MULTIVITAMIN) tablet Take 1 tablet by mouth daily.      Marland Kitchen nystatin (MYCOSTATIN) 100000 UNIT/ML suspension Take 5 mLs (500,000 Units total) by mouth 4 (four) times daily.  60 mL  0  . omeprazole (PRILOSEC) 20 MG capsule Take 20 mg by mouth daily.      . simvastatin (ZOCOR) 40 MG tablet Take 40 mg by mouth daily.      Marland Kitchen venlafaxine XR (EFFEXOR-XR) 150 MG 24 hr capsule Take 150 mg by mouth 2 (two) times  daily.       No current facility-administered medications on file prior to visit.   Review of Systems  Constitutional: Negative for unusual diaphoresis or other sweats  HENT: Negative for ringing in ear Eyes: Negative for double vision or worsening visual disturbance.  Respiratory: Negative for choking and stridor.   Gastrointestinal: Negative for vomiting or other signifcant bowel change Genitourinary: Negative for hematuria or decreased urine volume.  Musculoskeletal: Negative for other MSK pain or swelling Skin: Negative for color change and worsening wound.  Neurological: Negative for tremors and numbness other than noted  Psychiatric/Behavioral: Negative for decreased concentration or agitation other than above       Objective:   Physical Exam BP 140/80  Pulse 96  Temp(Src) 98.2 F (36.8 C) (Oral)  Ht 5\' 4"  (1.626 m)  Wt 151 lb 4 oz (68.607 kg)  BMI 25.95 kg/m2  SpO2 98% VS noted,  Constitutional: Pt appears well-developed, well-nourished.  HENT: Head: NCAT.  Right Ear: External ear normal.  Left Ear: External ear normal.  Eyes: . Pupils are equal, round, and reactive to light. Conjunctivae and EOM are normal Neck: Normal range of motion. Neck supple.  Cardiovascular: Normal rate and regular rhythm.   Pulmonary/Chest: Effort normal and breath sounds normal.  Neurological: Pt is alert. Not confused , motor grossly intact Skin: Skin is warm. No rash Psychiatric: Pt behavior is normal. No agitation.     Assessment & Plan:

## 2014-06-19 NOTE — Assessment & Plan Note (Signed)
stable overall by history and exam, recent data reviewed with pt, and pt to continue medical treatment as before,  to f/u any worsening symptoms or concerns SpO2 Readings from Last 3 Encounters:  06/19/14 98%  04/04/14 96%  03/12/14 96%

## 2014-06-19 NOTE — Assessment & Plan Note (Signed)
stable overall by history and exam, recent data reviewed with pt, and pt to continue medical treatment as before,  to f/u any worsening symptoms or concerns BP Readings from Last 3 Encounters:  06/19/14 140/80  04/04/14 132/80  03/12/14 130/70

## 2014-06-19 NOTE — Assessment & Plan Note (Signed)
stable overall by history and exam, recent data reviewed with pt, and pt to continue medical treatment as before,  to f/u any worsening symptoms or concerns Lab Results  Component Value Date   HGBA1C 5.8 06/06/2014

## 2014-06-20 ENCOUNTER — Encounter: Payer: Self-pay | Admitting: Gastroenterology

## 2014-07-30 ENCOUNTER — Encounter: Payer: Self-pay | Admitting: Gastroenterology

## 2014-08-13 ENCOUNTER — Ambulatory Visit (AMBULATORY_SURGERY_CENTER): Payer: Self-pay

## 2014-08-13 VITALS — Ht 64.5 in | Wt 153.6 lb

## 2014-08-13 DIAGNOSIS — Z86010 Personal history of colonic polyps: Secondary | ICD-10-CM

## 2014-08-13 MED ORDER — MOVIPREP 100 G PO SOLR: ORAL | Status: DC

## 2014-08-13 NOTE — Progress Notes (Signed)
Per pt, no allergies to soy or egg products.Pt not taking any weight loss meds or using  O2 at home. 

## 2014-08-21 DIAGNOSIS — Z23 Encounter for immunization: Secondary | ICD-10-CM | POA: Diagnosis not present

## 2014-08-27 ENCOUNTER — Encounter: Payer: Self-pay | Admitting: Gastroenterology

## 2014-08-27 ENCOUNTER — Ambulatory Visit (AMBULATORY_SURGERY_CENTER): Payer: Medicare Other | Admitting: Gastroenterology

## 2014-08-27 VITALS — BP 122/65 | HR 63 | Temp 96.7°F | Resp 16 | Ht 64.0 in | Wt 153.0 lb

## 2014-08-27 DIAGNOSIS — I10 Essential (primary) hypertension: Secondary | ICD-10-CM | POA: Diagnosis not present

## 2014-08-27 DIAGNOSIS — Z86010 Personal history of colonic polyps: Secondary | ICD-10-CM

## 2014-08-27 DIAGNOSIS — J449 Chronic obstructive pulmonary disease, unspecified: Secondary | ICD-10-CM | POA: Diagnosis not present

## 2014-08-27 DIAGNOSIS — E119 Type 2 diabetes mellitus without complications: Secondary | ICD-10-CM | POA: Diagnosis not present

## 2014-08-27 MED ORDER — SODIUM CHLORIDE 0.9 % IV SOLN: 500.0000 mL | INTRAVENOUS | Status: DC

## 2014-08-27 NOTE — Progress Notes (Signed)
Procedure ends, to recovery, report given and VSS. 

## 2014-08-27 NOTE — Patient Instructions (Signed)
YOU HAD AN ENDOSCOPIC PROCEDURE TODAY AT THE [facility]: Refer to the procedure report that was given to you for any specific questions about what was found during the examination.  If the procedure report does not answer your questions, please call your gastroenterologist to clarify.  If you requested that your care partner not be given the details of your procedure findings, then the procedure report has been included in a sealed envelope for you to review at your convenience later.  YOU SHOULD EXPECT: Some feelings of bloating in the abdomen. Passage of more gas than usual.  Walking can help get rid of the air that was put into your GI tract during the procedure and reduce the bloating. If you had a lower endoscopy (such as a colonoscopy or flexible sigmoidoscopy) you may notice spotting of blood in your stool or on the toilet paper. If you underwent a bowel prep for your procedure, then you may not have a normal bowel movement for a few days.  DIET: Your first meal following the procedure should be a light meal and then it is ok to progress to your normal diet.  A half-sandwich or bowl of soup is an example of a good first meal.  Heavy or fried foods are harder to digest and may make you feel nauseous or bloated.  Likewise meals heavy in dairy and vegetables can cause extra gas to form and this can also increase the bloating.  Drink plenty of fluids but you should avoid alcoholic beverages for 24 hours.  ACTIVITY: Your care partner should take you home directly after the procedure.  You should plan to take it easy, moving slowly for the rest of the day.  You can resume normal activity the day after the procedure however you should NOT DRIVE or use heavy machinery for 24 hours (because of the sedation medicines used during the test).    SYMPTOMS TO REPORT IMMEDIATELY: A gastroenterologist can be reached at any hour.  During normal business hours, 8:30 AM to 5:00 PM Monday through Friday,  call [phone].  After hours and on weekends, please call the GI answering service at [phone] who will take a message and have the physician on call contact you.   Following lower endoscopy (colonoscopy or flexible sigmoidoscopy):  Excessive amounts of blood in the stool  Significant tenderness or worsening of abdominal pains  Swelling of the abdomen that is new, acute  Fever of 100F or higher    FOLLOW UP: If any biopsies were taken you will be contacted by phone or by letter within the next 1-3 weeks.  Call your gastroenterologist if you have not heard about the biopsies in 3 weeks.  Our staff will call the home number listed on your records the next business day following your procedure to check on you and address any questions or concerns that you may have at that time regarding the information given to you following your procedure. This is a courtesy call and so if there is no answer at the home number and we have not heard from you through the emergency physician on call, we will assume that you have returned to your regular daily activities without incident.  SIGNATURES/CONFIDENTIALITY: You and/or your care partner have signed paperwork which will be entered into your electronic medical record.  These signatures attest to the fact that that the information above on your After Visit Summary has been reviewed and is understood.  Full responsibility of the confidentiality   of this discharge information lies with you and/or your care-partner.     

## 2014-08-27 NOTE — Op Note (Signed)
[place]  [address]. [place], [address]   COLONOSCOPY PROCEDURE REPORT  PATIENT: Tara, Neal  MR#: [id number] BIRTHDATE: 1941-09-25 , 73  yrs. old GENDER: female ENDOSCOPIST: Ladene Artist, MD, [employer] PROCEDURE DATE:  08/27/2014 PROCEDURE:   Colonoscopy, surveillance First Screening Colonoscopy - Avg.  risk and is 50 yrs.  old or older - No.  Prior Negative Screening - Now for repeat screening. N/A  History of Adenoma - Now for follow-up colonoscopy & has been > or = to 3 yrs.  Yes hx of adenoma.  Has been 3 or more years since last colonoscopy.  Polyps Removed Today? No.  Polyps Removed Today? No.  Recommend repeat exam, <10 yrs? Polyps Removed Today? No.  Recommend repeat exam, <10 yrs? Yes.  Polyps Removed Today? No.  Recommend repeat exam, <10 yrs? Yes.  High risk (family or personal hx). ASA CLASS:   Class II INDICATIONS:surveillance colonoscopy based on a history of adenomatous colonic polyp(s). MEDICATIONS: Monitored anesthesia care and Propofol 200 mg IV DESCRIPTION OF PROCEDURE:   After the risks benefits and alternatives of the procedure were thoroughly explained, informed consent was obtained.  The digital rectal exam revealed no abnormalities of the rectum.   The [id number]  endoscope was introduced through the anus and advanced to the cecum, which was identified by both the appendix and ileocecal valve. No adverse events experienced.   The quality of the prep was good, using MoviPrep  The instrument was then slowly withdrawn as the colon was fully examined.  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 Three  3-4 mm angiodysplastic lesions were found in the ascending colon. There was mild diverticulosis noted in the sigmoid colon.   he colon mucosa was otherwise normal.  Retroflexed views revealed internal Grade I hemorrhoids. The time to cecum=2 minutes 41 seconds.  Withdrawal time=9 minutes 1  seconds.  The scope was withdrawn and the procedure completed.  COMPLICATIONS: There were no immediate complications.  ENDOSCOPIC IMPRESSION: 1.   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   3 angiodysplastic lesions in the ascending colon 3.   Mild diverticulosis was noted in the sigmoid colon 4.   Grade I internal hemorrhoids  RECOMMENDATIONS: 1.  High fiber diet with liberal fluid intake. 2.  Repeat Colonoscopy in 5 years.  eSigned:  Ladene Artist, MD, [facility] 08/27/2014 11:08 AM

## 2014-08-28 ENCOUNTER — Telehealth: Payer: Self-pay | Admitting: *Deleted

## 2014-08-28 NOTE — Telephone Encounter (Signed)
  Follow up Call-  Call back number 08/27/2014  Post procedure Call Back phone  # [phone]  Permission to leave phone message Yes     Patient questions:  Do you have a fever, pain , or abdominal swelling? No. Pain Score  0 *  Have you tolerated food without any problems? Yes.    Have you been able to return to your normal activities? Yes.    Do you have any questions about your discharge instructions: Diet   No. Medications  No. Follow up visit  No.  Do you have questions or concerns about your Care? No.  Actions: * If pain score is 4 or above: No action needed, pain <4.

## 2014-09-04 DIAGNOSIS — H4011X2 Primary open-angle glaucoma, moderate stage: Secondary | ICD-10-CM | POA: Diagnosis not present

## 2014-11-13 DIAGNOSIS — H4011X2 Primary open-angle glaucoma, moderate stage: Secondary | ICD-10-CM | POA: Diagnosis not present

## 2014-11-18 ENCOUNTER — Ambulatory Visit (INDEPENDENT_AMBULATORY_CARE_PROVIDER_SITE_OTHER): Payer: Medicare Other | Admitting: Family

## 2014-11-18 ENCOUNTER — Encounter: Payer: Self-pay | Admitting: Family

## 2014-11-18 VITALS — BP 152/90 | HR 59 | Temp 98.0°F | Resp 18 | Ht 64.0 in | Wt 147.1 lb

## 2014-11-18 DIAGNOSIS — R05 Cough: Secondary | ICD-10-CM

## 2014-11-18 DIAGNOSIS — R059 Cough, unspecified: Secondary | ICD-10-CM | POA: Insufficient documentation

## 2014-11-18 MED ORDER — CLARITHROMYCIN 250 MG PO TABS: 250.0000 mg | ORAL_TABLET | Freq: Two times a day (BID) | ORAL | Status: DC

## 2014-11-18 MED ORDER — HYDROCODONE-HOMATROPINE 5-1.5 MG/5ML PO SYRP: 5.0000 mL | ORAL_SOLUTION | Freq: Three times a day (TID) | ORAL | Status: DC | PRN

## 2014-11-18 NOTE — Assessment & Plan Note (Signed)
Symptoms and exam consistent with developing bronchitis. Start clarithromycin. Hycodan as needed for cough and sleep. Continue over-the-counter medications as needed for supportive care and symptom relief. Follow up if symptoms worsen or fail to improve.

## 2014-11-18 NOTE — Progress Notes (Signed)
Pre visit review using our clinic review tool, if applicable. No additional management support is needed unless otherwise documented below in the visit note. 

## 2014-11-18 NOTE — Progress Notes (Signed)
Subjective:    Patient ID: Tara Neal, female    DOB: Apr 13, 1941, 73 y.o.   MRN: [id number]  Chief Complaint  Patient presents with  . Cough    cough and hoarsness, x3 days   HPI:  Tara Neal is a 73 y.o. female who presents today for an acute visit.  Acute symptoms of a cold started about 3 days ago. She has taken Mucinex, but it has had little effect. Currently experiencing a cough and hoarseness. Denies any fevers or sinus pressure. Has a significant history for developing bronchitis. Denies anything that makes it better or worse.  Allergies  Allergen Reactions  . Codeine     REACTION: Nausea  . Penicillins     REACTION: Rash  . Sulfonamide Derivatives     REACTION: Nausea  . Percocet [Oxycodone-Acetaminophen] Itching    Current Outpatient Prescriptions on File Prior to Visit  Medication Sig Dispense Refill  . alendronate (FOSAMAX) 70 MG tablet Take 1 tablet (70 mg total) by mouth every 7 (seven) days. On Saturdays. Take with a full glass of water on an empty stomach. 12 tablet 3  . Ascorbic Acid (VITAMIN C) 1000 MG tablet Take 1,000 mg by mouth daily.    Marland Kitchen atenolol (TENORMIN) 50 MG tablet Take 1 tablet (50 mg total) by mouth daily. 90 tablet 3  . benazepril (LOTENSIN) 40 MG tablet Take 1 tablet (40 mg total) by mouth daily with breakfast. 90 tablet 3  . BIAXIN XL 500 MG 24 hr tablet Take 2 tablets (1,000 mg total) by mouth daily. 14 tablet 0  . calcium carbonate (OS-CAL) 600 MG TABS Take 600 mg by mouth 2 (two) times daily with a meal.    . cetirizine (ZYRTEC) 10 MG tablet Take 10 mg by mouth daily with breakfast.     . Cholecalciferol (VITAMIN D3) 1000 UNITS CAPS Take 1 capsule by mouth daily.    . furosemide (LASIX) 40 MG tablet Take 40 mg by mouth.    . latanoprost (XALATAN) 0.005 % ophthalmic solution Place 1 drop into both eyes at bedtime.     . meloxicam (MOBIC) 15 MG tablet Take 15 mg by mouth daily.    . methocarbamol (ROBAXIN) 500 MG tablet Take 500 mg by  mouth every 6 (six) hours as needed for muscle spasms.     . Multiple Vitamin (MULTIVITAMIN) tablet Take 1 tablet by mouth daily.    Marland Kitchen nystatin (MYCOSTATIN) 100000 UNIT/ML suspension Take 5 mLs (500,000 Units total) by mouth 4 (four) times daily. 60 mL 0  . Omega-3 Fatty Acids (FISH OIL) 1200 MG CAPS Take by mouth. Take 2 a day    . omeprazole (PRILOSEC) 20 MG capsule Take 20 mg by mouth daily.    . simvastatin (ZOCOR) 40 MG tablet Take 40 mg by mouth daily.    . timolol (TIMOPTIC) 0.5 % ophthalmic solution Place 1 drop into both eyes daily.    Marland Kitchen venlafaxine XR (EFFEXOR-XR) 150 MG 24 hr capsule Take 150 mg by mouth 2 (two) times daily.     No current facility-administered medications on file prior to visit.     Review of Systems    See HPI  Objective:    BP 152/90 mmHg  Pulse 59  Temp(Src) 98 F (36.7 C) (Oral)  Resp 18  Ht 5\' 4"  (1.626 m)  Wt 147 lb 1.9 oz (66.733 kg)  BMI 25.24 kg/m2  SpO2 97% Nursing note and vital signs reviewed.  Physical Exam  Constitutional: She is oriented to person, place, and time. She appears well-developed and well-nourished. No distress.  HENT:  Right Ear: Hearing, tympanic membrane, external ear and ear canal normal.  Left Ear: Hearing, tympanic membrane, external ear and ear canal normal.  Nose: Nose normal. Right sinus exhibits no maxillary sinus tenderness and no frontal sinus tenderness. Left sinus exhibits no maxillary sinus tenderness and no frontal sinus tenderness.  Mouth/Throat: Uvula is midline, oropharynx is clear and moist and mucous membranes are normal.  Cardiovascular: Normal rate, regular rhythm, normal heart sounds and intact distal pulses.   Pulmonary/Chest: Effort normal and breath sounds normal.  Neurological: She is alert and oriented to person, place, and time.  Skin: Skin is warm and dry.  Psychiatric: She has a normal mood and affect. Her behavior is normal. Judgment and thought content normal.       Assessment &  Plan:

## 2014-11-18 NOTE — Patient Instructions (Signed)
Thank you for choosing [facility].  Summary/Instructions:  Your prescription(s) have been submitted to your pharmacy. Please take as directed and contact our office if you believe you are having problem(s) with the medication(s).  If your symptoms worsen or fail to improve, please contact our office for further instruction, or in case of emergency go directly to the emergency room at the closest medical facility.    Acute Bronchitis Bronchitis is inflammation of the airways that extend from the windpipe into the lungs (bronchi). The inflammation often causes mucus to develop. This leads to a cough, which is the most common symptom of bronchitis.  In acute bronchitis, the condition usually develops suddenly and goes away over time, usually in a couple weeks. Smoking, allergies, and asthma can make bronchitis worse. Repeated episodes of bronchitis may cause further lung problems.  CAUSES Acute bronchitis is most often caused by the same virus that causes a cold. The virus can spread from person to person (contagious) through coughing, sneezing, and touching contaminated objects. SIGNS AND SYMPTOMS   Cough.   Fever.   Coughing up mucus.   Body aches.   Chest congestion.   Chills.   Shortness of breath.   Sore throat.  DIAGNOSIS  Acute bronchitis is usually diagnosed through a physical exam. Your health care provider will also ask you questions about your medical history. Tests, such as chest X-rays, are sometimes done to rule out other conditions.  TREATMENT  Acute bronchitis usually goes away in a couple weeks. Oftentimes, no medical treatment is necessary. Medicines are sometimes given for relief of fever or cough. Antibiotic medicines are usually not needed but may be prescribed in certain situations. In some cases, an inhaler may be recommended to help reduce shortness of breath and control the cough. A cool mist vaporizer may also be used to help thin bronchial  secretions and make it easier to clear the chest.  HOME CARE INSTRUCTIONS  Get plenty of rest.   Drink enough fluids to keep your urine clear or pale yellow (unless you have a medical condition that requires fluid restriction). Increasing fluids may help thin your respiratory secretions (sputum) and reduce chest congestion, and it will prevent dehydration.   Take medicines only as directed by your health care provider.  If you were prescribed an antibiotic medicine, finish it all even if you start to feel better.  Avoid smoking and secondhand smoke. Exposure to cigarette smoke or irritating chemicals will make bronchitis worse. If you are a smoker, consider using nicotine gum or skin patches to help control withdrawal symptoms. Quitting smoking will help your lungs heal faster.   Reduce the chances of another bout of acute bronchitis by washing your hands frequently, avoiding people with cold symptoms, and trying not to touch your hands to your mouth, nose, or eyes.   Keep all follow-up visits as directed by your health care provider.  SEEK MEDICAL CARE IF: Your symptoms do not improve after 1 week of treatment.  SEEK IMMEDIATE MEDICAL CARE IF:  You develop an increased fever or chills.   You have chest pain.   You have severe shortness of breath.  You have bloody sputum.   You develop dehydration.  You faint or repeatedly feel like you are going to pass out.  You develop repeated vomiting.  You develop a severe headache. MAKE SURE YOU:   Understand these instructions.  Will watch your condition.  Will get help right away if you are not doing well or   get worse. Document Released: 12/23/2004 Document Revised: 04/01/2014 Document Reviewed: 05/08/2013 ExitCare Patient Information 2015 [employer]. This information is not intended to replace advice given to you by your health care provider. Make sure you discuss any questions you have with your health care  provider.  

## 2014-11-19 ENCOUNTER — Other Ambulatory Visit: Payer: Self-pay | Admitting: Internal Medicine

## 2014-11-20 ENCOUNTER — Telehealth: Payer: Self-pay | Admitting: *Deleted

## 2014-11-20 NOTE — Telephone Encounter (Signed)
[place] Night - Client TELEPHONE [place] Patient Name: Tara Neal Gender: Female DOB: May 15, 1941 Age: 74 Y 10 M 16 D Return Phone Number: [phone] (Primary) Address: City/State/Zip: [place] [employer] [facility] [place] Night - Client Client Site [place] - Night Physician [place], [place] Type Call Call Type Triage / [place] Name [facility] Relationship To Patient Spouse Return Phone Number [phone] (Primary) Chief Complaint Prescription Refill or Medication Request (non symptomatic) Initial Comment caller states wife was seen yesterday and prescribed a medication, the pharm still doesn't have script PreDisposition Call Doctor Nurse Assessment Nurse: Einar Gip, RN, Neoma Laming Date/Time Eilene Ghazi Time): 11/19/2014 6:01:45 PM Confirm and document reason for call. If symptomatic, describe symptoms. ---Patient states she was seen yesterday for cold and the pharmacy has still not gotten the prescription. Thinks it was an antibiotic. Allergic to penicillin, sulfa, percocet and codeine. [place]- [phone] Has the patient traveled out of the country within the last 30 days? ---Not Applicable Does the patient require triage? ---No Guidelines Guideline Title Affirmed Question Affirmed Notes Nurse Date/Time Eilene Ghazi Time) Medication Question Call [1] Prescription not at pharmacy AND [2] was prescribed today by PCP Einar Gip, RN, Neoma Laming 11/19/2014 6:05:44 PM Disp. Time Eilene Ghazi Time) Disposition Final User 11/19/2014 5:29:23 PM Send To Clinical Follow Up [place] 11/19/2014 6:09:56 PM Pharmacy Call Einar Gip, RN, Neoma Laming Reason: Pharmacy says the got a prescription for clindamycin but it is out of stock. States they have not checked with any other pharmacy to see if they have it available. States they will have the medication about lunch tomorrow. Advised patient.  Patient verbalized understanding. 11/19/2014 6:10:11 PM Call Completed Einar Gip RN, Neoma Laming 11/19/2014 6:06:20 PM Call PCP Now Yes Einar Gip, RN, Rochel Brome NOTE: All timestamps contained within this report are represented as [place] Standard Time. CONFIDENTIALTY NOTICE: This fax transmission is intended only for the addressee. It contains information that is legally privileged, confidential or otherwise protected from use or disclosure. If you are not the intended recipient, you are strictly prohibited from reviewing, disclosing, copying using or disseminating any of this information or taking any action in reliance on or regarding this information. If you have received this fax in error, please notify us immediately by telephone so that we can arrange for its return to [place]. Phone: [phone], Toll-Free: [phone], Fax: [phone] Page: 2 of 2 Call Id: [id number] Caller Understands: Yes Disagree/Comply: Comply Care Advice Given Per Guideline CALL PCP NOW: You need to discuss this with your doctor. I'll page him now. If you haven't heard from the on-call doctor within 30 minutes, call again. CARE ADVICE given per Medication Question Call (Adult) guideline. After Care Instructions Given Call Event Type User Date / Time Description

## 2015-01-03 ENCOUNTER — Ambulatory Visit: Payer: Medicare Other | Admitting: Podiatrist

## 2015-01-08 ENCOUNTER — Ambulatory Visit (INDEPENDENT_AMBULATORY_CARE_PROVIDER_SITE_OTHER): Payer: Medicare Other

## 2015-01-08 ENCOUNTER — Ambulatory Visit (INDEPENDENT_AMBULATORY_CARE_PROVIDER_SITE_OTHER): Payer: Medicare Other | Admitting: Podiatrist

## 2015-01-08 ENCOUNTER — Encounter: Payer: Self-pay | Admitting: Podiatrist

## 2015-01-08 VITALS — BP 143/72 | HR 52 | Resp 16

## 2015-01-08 DIAGNOSIS — L600 Ingrowing nail: Secondary | ICD-10-CM | POA: Diagnosis not present

## 2015-01-08 DIAGNOSIS — M779 Enthesopathy, unspecified: Secondary | ICD-10-CM

## 2015-01-08 NOTE — Progress Notes (Signed)
   Subjective:    Patient ID: Tara Neal, female    DOB: 1940/12/22, 74 y.o.   MRN: [id number]  HPI Comments: "I have these ingrowns and two sore feet on the bottom"  Patient c/o tenderness plantar forefoot bilateral for about 1 year. Walking a lot makes worse.   Also, c/o tender 1st toes bilateral, both borders of nail, for few months. Tried trimming out-some help.     Review of Systems  HENT: Positive for hearing loss.   Musculoskeletal: Positive for back pain and gait problem.  Neurological: Positive for light-headedness.  All other systems reviewed and are negative.         Physical Exam   Allergies  Allergen Reactions  . Codeine     REACTION: Nausea  . Penicillins     REACTION: Rash  . Sulfonamide Derivatives     REACTION: Nausea  . Percocet [Oxycodone-Acetaminophen] Itching    Patient is awake, alert, and oriented x 3.  In no acute distress.  Vascular status is intact with palpable pedal pulses at 2/4 DP and PT bilateral and capillary refill time within normal limits. Neurological sensation is also intact bilaterally via Semmes Weinstein monofilament at 5/5 sites. Light touch, vibratory sensation, Achilles tendon reflex is intact. Dermatological exam reveals ingrown toenails lateral side of bilateral halluces.  Pain on palpation is present no redness, drainage or sign of infection present.  otherwise skin color, turger and texture as normal. No open lesions present.  Musculature intact with dorsiflexion, plantarflexion, inversion, eversion. Generalized forefoot pain noted consistent with metatarsalgia bilateral.       Assessment & Plan:  Metatarsalgia bilateral, ingrown toenails bilateral first  Plan:  Treatment options and alternatives discussed.  Recommended permanent phenol matrixectomy and patient agreed.  Bilateral halluces were prepped with alcohol and a 1 to 1 mix of 0.5% marcaine plain and 2% lidocaine plain was administered in a digital block fashion.  The toe  bilateral first was then prepped with betadine solution and exsanguinated.  The offending lateral nail border was then excised and matrix tissue exposed.  Phenol was then applied to the matrix tissue followed by an alcohol wash.  Antibiotic ointment and a dry sterile dressing was applied.  The patient was dispensed instructions for aftercare.  Dispensed powerstep inserts for the forefoot pain.  May be a candidate for custom inserts if powerstep inserts is not beneficial.

## 2015-01-08 NOTE — Patient Instructions (Signed)

## 2015-01-20 ENCOUNTER — Other Ambulatory Visit: Payer: Self-pay

## 2015-01-20 DIAGNOSIS — Z1231 Encounter for screening mammogram for malignant neoplasm of breast: Secondary | ICD-10-CM

## 2015-01-22 ENCOUNTER — Other Ambulatory Visit: Payer: Self-pay | Admitting: Internal Medicine

## 2015-01-29 ENCOUNTER — Ambulatory Visit
Admission: RE | Admit: 2015-01-29 | Discharge: 2015-01-29 | Disposition: A | Discharge status: Home / Self Care | Payer: Medicare Other | Source: Ambulatory Visit

## 2015-01-29 DIAGNOSIS — Z1231 Encounter for screening mammogram for malignant neoplasm of breast: Secondary | ICD-10-CM

## 2015-01-30 ENCOUNTER — Other Ambulatory Visit: Payer: Self-pay | Admitting: Podiatrist

## 2015-01-30 ENCOUNTER — Telehealth: Payer: Self-pay | Admitting: *Deleted

## 2015-01-30 MED ORDER — CLINDAMYCIN HCL 150 MG PO CAPS: 150.0000 mg | ORAL_CAPSULE | Freq: Three times a day (TID) | ORAL | Status: DC

## 2015-01-30 NOTE — Telephone Encounter (Signed)
Pt states the antibiotic is not at the [facility].  Dr. Valentina Lucks states she will e-scribe now, and I informed pt.

## 2015-02-03 ENCOUNTER — Other Ambulatory Visit: Payer: Self-pay | Admitting: Internal Medicine

## 2015-02-18 DIAGNOSIS — L720 Epidermal cyst: Secondary | ICD-10-CM | POA: Diagnosis not present

## 2015-02-18 DIAGNOSIS — L821 Other seborrheic keratosis: Secondary | ICD-10-CM | POA: Diagnosis not present

## 2015-02-18 DIAGNOSIS — L02821 Furuncle of head [any part, except face]: Secondary | ICD-10-CM | POA: Diagnosis not present

## 2015-02-18 DIAGNOSIS — D225 Melanocytic nevi of trunk: Secondary | ICD-10-CM | POA: Diagnosis not present

## 2015-02-18 DIAGNOSIS — D485 Neoplasm of uncertain behavior of skin: Secondary | ICD-10-CM | POA: Diagnosis not present

## 2015-02-19 DIAGNOSIS — Z4789 Encounter for other orthopedic aftercare: Secondary | ICD-10-CM | POA: Diagnosis not present

## 2015-02-19 DIAGNOSIS — M7061 Trochanteric bursitis, right hip: Secondary | ICD-10-CM | POA: Diagnosis not present

## 2015-02-20 ENCOUNTER — Ambulatory Visit (INDEPENDENT_AMBULATORY_CARE_PROVIDER_SITE_OTHER)
Admission: RE | Admit: 2015-02-20 | Discharge: 2015-02-20 | Disposition: A | Discharge status: Home / Self Care | Payer: Medicare Other | Source: Ambulatory Visit | Attending: Internal Medicine | Admitting: Internal Medicine

## 2015-02-20 ENCOUNTER — Ambulatory Visit (INDEPENDENT_AMBULATORY_CARE_PROVIDER_SITE_OTHER): Payer: Medicare Other | Admitting: Internal Medicine

## 2015-02-20 ENCOUNTER — Encounter: Payer: Self-pay | Admitting: Internal Medicine

## 2015-02-20 VITALS — BP 122/74 | HR 61 | Temp 98.2°F | Resp 18 | Ht 64.0 in | Wt 144.0 lb

## 2015-02-20 DIAGNOSIS — R938 Abnormal findings on diagnostic imaging of other specified body structures: Secondary | ICD-10-CM | POA: Diagnosis not present

## 2015-02-20 DIAGNOSIS — E119 Type 2 diabetes mellitus without complications: Secondary | ICD-10-CM | POA: Diagnosis not present

## 2015-02-20 DIAGNOSIS — M25551 Pain in right hip: Secondary | ICD-10-CM | POA: Diagnosis not present

## 2015-02-20 DIAGNOSIS — R9389 Abnormal findings on diagnostic imaging of other specified body structures: Secondary | ICD-10-CM | POA: Insufficient documentation

## 2015-02-20 DIAGNOSIS — I10 Essential (primary) hypertension: Secondary | ICD-10-CM | POA: Diagnosis not present

## 2015-02-20 NOTE — Assessment & Plan Note (Signed)
stable overall by history and exam, recent data reviewed with pt, and pt to continue medical treatment as before,  to f/u any worsening symptoms or concerns BP Readings from Last 3 Encounters:  02/20/15 122/74  01/08/15 143/72  11/18/14 152/90

## 2015-02-20 NOTE — Assessment & Plan Note (Signed)
With RLQ calcification by report on film yesterday, ? Stone vs vascular calcification vs other - pt asympt, exam benign, will repeat film but would on LE art dopplers or even CTA abd/pelvis given pt asymptomatic

## 2015-02-20 NOTE — Progress Notes (Signed)
Pre visit review using our clinic review tool, if applicable. No additional management support is needed unless otherwise documented below in the visit note. 

## 2015-02-20 NOTE — Progress Notes (Signed)
Subjective:    Patient ID: Tara Neal, female    DOB: 06-Mar-1941, 74 y.o.   MRN: [id number]  HPI   Here to f/u, saw [place] [facility] yesterday (physician assistant) who noticed new RLQ area calcification of unclear etiology on right hip film, reportedly new from previous film approx 2 yrs ago.  Pt without hx of PVD, intestinal angina or LE claudication. Has no prior surgury (no [facility] BSO).  No complaints  Pt denies fever, wt loss, night sweats, loss of appetite, or other constitutional symptoms  Pt denies chest pain, increased sob or doe, wheezing, orthopnea, PND, increased LE swelling, palpitations, dizziness or syncope. Right hip not yet apprec better after cortisone shot yesterday  Denies worsening reflux, abd pain, dysphagia, n/v, bowel change or blood. No hx of renal stone- Denies urinary symptoms such as dysuria, frequency, urgency, flank pain, hematuria or n/v, fever, chills.  Past Medical History  Diagnosis Date  . Hyperlipidemia   . Hypertension   . Mild obesity   . Spontaneous pneumothorax     1968  . GERD (gastroesophageal reflux disease)   . Osteoarthritis   . Low back pain   . Osteoporosis   . Hormone replacement therapy (postmenopausal)   . Anxiety   . Osteopenia   . COPD (chronic obstructive pulmonary disease)   . Diverticulosis of colon   . Shortness of breath     with exertion   . Anemia     hx of   . Chronic insomnia 04/22/2012  . Scoliosis   . Pneumonia     hx of 5 years ago   . Migraine headache     denies  . Umbilical hernia 8/65/7846  . Glaucoma   . Allergy    Past Surgical History  Procedure Laterality Date  . Tonsillectomy    . Lumbar disc surgery       x 2  . Other surgical history      benign growth removed from right under ear at age 66   . Excision/release bursa hip  04/26/2012    Procedure: EXCISION/RELEASE BURSA HIP;  Surgeon: Gearlean Alf, MD;  Location: [facility];  Service: Orthopedics;  Laterality: Right;  . Hip adductor tenotomy    . Eye surgery       - bil cataract surg and Retina surgery to left eye  . Tonsillectomy    . Hernia repair  96/29/5284    umbilical hernia  . Shoulder arthroscopy      left shoulder  . Wrist fracture surgery      plate in left wrist/ fractured 2 times    reports that she quit smoking about 33 years ago. Her smoking use included Cigarettes. She quit after 20 years of use. She has never used smokeless tobacco. She reports that she does not drink alcohol or use illicit drugs. family history includes Diabetes in her mother; Heart disease in her mother; Hyperlipidemia in her mother. Allergies  Allergen Reactions  . Codeine     REACTION: Nausea  . Penicillins     REACTION: Rash  . Sulfonamide Derivatives     REACTION: Nausea  . Percocet [Oxycodone-Acetaminophen] Itching   Current Outpatient Prescriptions on File Prior to Visit  Medication Sig Dispense Refill  . alendronate (FOSAMAX) 70 MG tablet Take 1 tablet (70 mg total) by mouth every 7 (seven) days. On Saturdays. Take with a full glass of water on an empty stomach. 12 tablet 3  . Ascorbic Acid (VITAMIN C) 1000  MG tablet Take 1,000 mg by mouth daily.    Marland Kitchen atenolol (TENORMIN) 50 MG tablet TAKE 1 TABLET  DAILY 90 tablet 3  . benazepril (LOTENSIN) 40 MG tablet TAKE 1 TABLET EVERY DAY WITH BREAKFAST 90 tablet 1  . calcium carbonate (OS-CAL) 600 MG TABS Take 600 mg by mouth 2 (two) times daily with a meal.    . cetirizine (ZYRTEC) 10 MG tablet Take 10 mg by mouth daily with breakfast.     . Cholecalciferol (VITAMIN D3) 1000 UNITS CAPS Take 1 capsule by mouth daily.    . clarithromycin (BIAXIN) 250 MG tablet Take 1 tablet (250 mg total) by mouth 2 (two) times daily. 20 tablet 0  . furosemide (LASIX) 40 MG tablet TAKE 1 TABLET EVERY DAY WITH BREAKFAST 90 tablet 1  . HYDROcodone-homatropine (HYCODAN) 5-1.5 MG/5ML syrup Take 5 mLs by mouth every 8 (eight) hours as needed for cough. 120 mL 0  . latanoprost (XALATAN) 0.005 % ophthalmic solution Place 1 drop  into both eyes at bedtime.     . meloxicam (MOBIC) 15 MG tablet TAKE 1 TABLET EVERY DAY WITH BREAKFAST 90 tablet 1  . methocarbamol (ROBAXIN) 500 MG tablet Take 500 mg by mouth every 6 (six) hours as needed for muscle spasms.     . Multiple Vitamin (MULTIVITAMIN) tablet Take 1 tablet by mouth daily.    Marland Kitchen nystatin (MYCOSTATIN) 100000 UNIT/ML suspension Take 5 mLs (500,000 Units total) by mouth 4 (four) times daily. 60 mL 0  . Omega-3 Fatty Acids (FISH OIL) 1200 MG CAPS Take by mouth. Take 2 a day    . omeprazole (PRILOSEC) 20 MG capsule TAKE 2 CAPSULES EVERY DAY 180 capsule 1  . simvastatin (ZOCOR) 40 MG tablet TAKE 1 TABLET EVERY DAY WITH BREAKFAST 90 tablet 1  . timolol (TIMOPTIC) 0.5 % ophthalmic solution Place 1 drop into both eyes daily.    Marland Kitchen venlafaxine XR (EFFEXOR-XR) 150 MG 24 hr capsule TAKE 1 CAPSULE EVERY DAY WITH BREAKFAST 90 capsule 1   No current facility-administered medications on file prior to visit.    Review of Systems  Constitutional: Negative for unusual diaphoresis or night sweats HENT: Negative for ringing in ear or discharge Eyes: Negative for double vision or worsening visual disturbance.  Respiratory: Negative for choking and stridor.   Gastrointestinal: Negative for vomiting or other signifcant bowel change Genitourinary: Negative for hematuria or change in urine volume.  Musculoskeletal: Negative for other MSK pain or swelling Skin: Negative for color change and worsening wound.  Neurological: Negative for tremors and numbness other than noted  Psychiatric/Behavioral: Negative for decreased concentration or agitation other than above       Objective:   Physical Exam BP 122/74 mmHg  Pulse 61  Temp(Src) 98.2 F (36.8 C) (Oral)  Resp 18  Ht 5\' 4"  (1.626 m)  Wt 144 lb (65.318 kg)  BMI 24.71 kg/m2  SpO2 94% VS noted, no ill appearing Constitutional: Pt appears in no significant distress HENT: Head: NCAT.  Right Ear: External ear normal.  Left Ear:  External ear normal.  Eyes: . Pupils are equal, round, and reactive to light. Conjunctivae and EOM are normal Neck: Normal range of motion. Neck supple.  Cardiovascular: Normal rate and regular rhythm.   Pulmonary/Chest: Effort normal and breath sounds without rales or wheezing.  Abd:  Soft, NT, ND, + BS Neurological: Pt is alert. Not confused , motor grossly intact Skin: Skin is warm. No rash, no LE edema., 1-2+ dorsalis pedis bilat  Psychiatric: Pt behavior is normal. No agitation.     Assessment & Plan:

## 2015-02-20 NOTE — Patient Instructions (Signed)
Please continue all other medications as before, and refills have been done if requested.  Please have the pharmacy call with any other refills you may need.  Please keep your appointments with your specialists as you may have planned  Please go to the XRAY Department in the Basement (go straight as you get off the elevator) for the x-ray testing  You will be contacted by phone if any changes need to be made immediately.  Otherwise, you will receive a letter about your results with an explanation, but please check with MyChart first.  Please remember to sign up for MyChart if you have not done so, as this will be important to you in the future with finding out test results, communicating by private email, and scheduling acute appointments online when needed.  

## 2015-03-04 DIAGNOSIS — M25511 Pain in right shoulder: Secondary | ICD-10-CM | POA: Diagnosis not present

## 2015-03-04 DIAGNOSIS — M25551 Pain in right hip: Secondary | ICD-10-CM | POA: Diagnosis not present

## 2015-03-06 DIAGNOSIS — M7061 Trochanteric bursitis, right hip: Secondary | ICD-10-CM | POA: Diagnosis not present

## 2015-03-11 DIAGNOSIS — M7061 Trochanteric bursitis, right hip: Secondary | ICD-10-CM | POA: Diagnosis not present

## 2015-03-13 DIAGNOSIS — M7061 Trochanteric bursitis, right hip: Secondary | ICD-10-CM | POA: Diagnosis not present

## 2015-03-19 ENCOUNTER — Other Ambulatory Visit: Payer: Self-pay | Admitting: Internal Medicine

## 2015-04-03 DIAGNOSIS — L02828 Furuncle of other sites: Secondary | ICD-10-CM | POA: Diagnosis not present

## 2015-04-03 DIAGNOSIS — L720 Epidermal cyst: Secondary | ICD-10-CM | POA: Diagnosis not present

## 2015-04-10 ENCOUNTER — Encounter: Payer: Self-pay | Admitting: Internal Medicine

## 2015-04-10 ENCOUNTER — Ambulatory Visit (INDEPENDENT_AMBULATORY_CARE_PROVIDER_SITE_OTHER): Payer: Medicare Other | Admitting: Internal Medicine

## 2015-04-10 VITALS — BP 172/98 | HR 82 | Temp 98.2°F | Wt 140.0 lb

## 2015-04-10 DIAGNOSIS — R42 Dizziness and giddiness: Secondary | ICD-10-CM

## 2015-04-10 DIAGNOSIS — J449 Chronic obstructive pulmonary disease, unspecified: Secondary | ICD-10-CM

## 2015-04-10 DIAGNOSIS — I951 Orthostatic hypotension: Secondary | ICD-10-CM

## 2015-04-10 DIAGNOSIS — R296 Repeated falls: Secondary | ICD-10-CM

## 2015-04-10 DIAGNOSIS — E119 Type 2 diabetes mellitus without complications: Secondary | ICD-10-CM

## 2015-04-10 DIAGNOSIS — R413 Other amnesia: Secondary | ICD-10-CM | POA: Diagnosis not present

## 2015-04-10 NOTE — Assessment & Plan Note (Signed)
Likely related to lasix use, but for labs as documented r/o other metabolic or UTI

## 2015-04-10 NOTE — Assessment & Plan Note (Signed)
stable overall by history and exam, recent data reviewed with pt, and pt to continue medical treatment as before,  to f/u any worsening symptoms or concerns Lab Results  Component Value Date   HGBA1C 5.8 06/06/2014   For f/u a1c

## 2015-04-10 NOTE — Assessment & Plan Note (Signed)
stable overall by history and exam, recent data reviewed with pt, and pt to continue medical treatment as before,  to f/u any worsening symptoms or concerns SpO2 Readings from Last 3 Encounters:  04/10/15 99%  02/20/15 94%  11/18/14 97%

## 2015-04-10 NOTE — Progress Notes (Signed)
Subjective:    Patient ID: Tara Neal, female    DOB: Feb 13, 1941, 74 y.o.   MRN: [id number]  HPI    Here with 2 wks onset sudden dizziness and difficulty with ambulation, at least 2-3 falls (with one or two in the last 6 mo prior to that per husband here today).  She cant really relate whether it seems positional or postural or peripheral neuropathic; Seems intermittent, mild but occas severe, not assoc with tremor but with gait change with shortened steps and off balance in gait, husband feels has to help support her in ambulation sometimes.  Denies HA, ST, sinus symptoms, cough, fever, and Pt denies chest pain, increased sob or doe, wheezing, orthopnea, PND, increased LE swelling, palpitations, dizziness or syncope.  Pt denies new neurological symptoms such as new headache, or facial or extremity weakness or numbness   Pt denies polydipsia, polyuria.  No overt bleeding or bruising or hx of anemia. No hx of stroke or similar symptoms prior.  Does also with c/o several months increasing ST memory difficulty. Takes regular BB and lasix, no recent hx of n/v, abd pain, bowel change or blood.  Past Medical History  Diagnosis Date  . Hyperlipidemia   . Hypertension   . Mild obesity   . Spontaneous pneumothorax     1968  . GERD (gastroesophageal reflux disease)   . Osteoarthritis   . Low back pain   . Osteoporosis   . Hormone replacement therapy (postmenopausal)   . Anxiety   . Osteopenia   . COPD (chronic obstructive pulmonary disease)   . Diverticulosis of colon   . Shortness of breath     with exertion   . Anemia     hx of   . Chronic insomnia 04/22/2012  . Scoliosis   . Pneumonia     hx of 5 years ago   . Migraine headache     denies  . Umbilical hernia 3/00/7622  . Glaucoma   . Allergy    Past Surgical History  Procedure Laterality Date  . Tonsillectomy    . Lumbar disc surgery       x 2  . Other surgical history      benign growth removed from right under ear at age 39   .  Excision/release bursa hip  04/26/2012    Procedure: EXCISION/RELEASE BURSA HIP;  Surgeon: Gearlean Alf, MD;  Location: [facility];  Service: Orthopedics;  Laterality: Right;  . Hip adductor tenotomy    . Eye surgery      - bil cataract surg and Retina surgery to left eye  . Tonsillectomy    . Hernia repair  63/33/5456    umbilical hernia  . Shoulder arthroscopy      left shoulder  . Wrist fracture surgery      plate in left wrist/ fractured 2 times    reports that she quit smoking about 33 years ago. Her smoking use included Cigarettes. She quit after 20 years of use. She has never used smokeless tobacco. She reports that she does not drink alcohol or use illicit drugs. family history includes Diabetes in her mother; Heart disease in her mother; Hyperlipidemia in her mother. Allergies  Allergen Reactions  . Codeine     REACTION: Nausea  . Penicillins     REACTION: Rash  . Sulfonamide Derivatives     REACTION: Nausea  . Percocet [Oxycodone-Acetaminophen] Itching   Current Outpatient Prescriptions on File Prior to Visit  Medication  Sig Dispense Refill  . alendronate (FOSAMAX) 70 MG tablet Take 1 tablet (70 mg total) by mouth every 7 (seven) days. On Saturdays. Take with a full glass of water on an empty stomach. 12 tablet 3  . Ascorbic Acid (VITAMIN C) 1000 MG tablet Take 1,000 mg by mouth daily.    Marland Kitchen atenolol (TENORMIN) 50 MG tablet TAKE 1 TABLET  DAILY 90 tablet 3  . benazepril (LOTENSIN) 40 MG tablet TAKE 1 TABLET EVERY DAY WITH BREAKFAST 90 tablet 1  . calcium carbonate (OS-CAL) 600 MG TABS Take 600 mg by mouth 2 (two) times daily with a meal.    . cetirizine (ZYRTEC) 10 MG tablet Take 10 mg by mouth daily with breakfast.     . Cholecalciferol (VITAMIN D3) 1000 UNITS CAPS Take 1 capsule by mouth daily.    . clarithromycin (BIAXIN) 250 MG tablet Take 1 tablet (250 mg total) by mouth 2 (two) times daily. 20 tablet 0  . furosemide (LASIX) 40 MG tablet TAKE 1 TABLET EVERY DAY WITH  BREAKFAST 90 tablet 3  . HYDROcodone-homatropine (HYCODAN) 5-1.5 MG/5ML syrup Take 5 mLs by mouth every 8 (eight) hours as needed for cough. 120 mL 0  . latanoprost (XALATAN) 0.005 % ophthalmic solution Place 1 drop into both eyes at bedtime.     . meloxicam (MOBIC) 15 MG tablet TAKE 1 TABLET EVERY DAY WITH BREAKFAST 90 tablet 1  . methocarbamol (ROBAXIN) 500 MG tablet Take 500 mg by mouth every 6 (six) hours as needed for muscle spasms.     . Multiple Vitamin (MULTIVITAMIN) tablet Take 1 tablet by mouth daily.    Marland Kitchen nystatin (MYCOSTATIN) 100000 UNIT/ML suspension Take 5 mLs (500,000 Units total) by mouth 4 (four) times daily. 60 mL 0  . Omega-3 Fatty Acids (FISH OIL) 1200 MG CAPS Take by mouth. Take 2 a day    . omeprazole (PRILOSEC) 20 MG capsule TAKE 2 CAPSULES EVERY DAY 180 capsule 1  . simvastatin (ZOCOR) 40 MG tablet TAKE 1 TABLET EVERY DAY WITH BREAKFAST 90 tablet 1  . timolol (TIMOPTIC) 0.5 % ophthalmic solution Place 1 drop into both eyes daily.    Marland Kitchen venlafaxine XR (EFFEXOR-XR) 150 MG 24 hr capsule TAKE 1 CAPSULE EVERY DAY WITH BREAKFAST 90 capsule 1   No current facility-administered medications on file prior to visit.   Review of Systems  Constitutional: Negative for unusual diaphoresis or night sweats HENT: Negative for ringing in ear or discharge Eyes: Negative for double vision or worsening visual disturbance.  Respiratory: Negative for choking and stridor.   Gastrointestinal: Negative for vomiting or other signifcant bowel change Genitourinary: Negative for hematuria or change in urine volume.  Musculoskeletal: Negative for other MSK pain or swelling Skin: Negative for color change and worsening wound.  Neurological: Negative for tremors and numbness other than noted  Psychiatric/Behavioral: Negative for decreased concentration or agitation other than above       Objective:   Physical Exam BP 122/84 mmHg  Pulse 82  Temp(Src) 98.2 F (36.8 C) (Oral)  Wt 140 lb 0.6 oz  (63.522 kg)  SpO2 99% VS noted, including orthostatics as documented today Constitutional: Pt appears in no significant distress HENT: Head: NCAT.  Right Ear: External ear normal.  Left Ear: External ear normal.  Right tm only with mild erythema.  Max sinus areas non tender.  Pharynx with no erythema, no exudate Eyes: . Pupils are equal, round, and reactive to light. Conjunctivae and EOM are normal Neck: Normal range  of motion. Neck supple.  Cardiovascular: Normal rate and regular rhythm.   Pulmonary/Chest: Effort normal and breath sounds without rales or wheezing.  Abd:  Soft, NT, ND, + BS Neurological: Pt is alert. ? mild confused , motor grossly intact but general weak, cn 2-12 intact, weak and staggery gait, supported by husband Skin: Skin is warm. No rash, no LE edema Psychiatric: Pt behavior is normal. No agitation.      Assessment & Plan:

## 2015-04-10 NOTE — Assessment & Plan Note (Signed)
To stay home over the next few days, avoid further falls

## 2015-04-10 NOTE — Assessment & Plan Note (Addendum)
Marked, likely related to ongoing lasix - to d/c lasix, increase po fluids  Note:  Total time for pt hx, exam, review of record with pt in the room, determination of diagnoses and plan for further eval and tx is > 40 min, with over 50% spent in coordination and counseling of patient

## 2015-04-10 NOTE — Patient Instructions (Signed)
OK to stop the lasix  Please drink more fluids over the next 5 days than you would usually drink, with sips all the time at least  Please continue all other medications as before, and refills have been done if requested.  Please have the pharmacy call with any other refills you may need.  Please keep your appointments with your specialists as you may have planned  Please go to the LAB in the Basement (turn left off the elevator) for the tests to be done tomorrow  You will be contacted by phone if any changes need to be made immediately.  Otherwise, you will receive a letter about your results with an explanation, but please check with MyChart first.  Please remember to sign up for MyChart if you have not done so, as this will be important to you in the future with finding out test results, communicating by private email, and scheduling acute appointments online when needed.  The office will call about a follow up appt for Tues May 17

## 2015-04-10 NOTE — Assessment & Plan Note (Signed)
Would need reassesed when more hydrated, consider MRI, neuro referral

## 2015-04-11 ENCOUNTER — Other Ambulatory Visit (INDEPENDENT_AMBULATORY_CARE_PROVIDER_SITE_OTHER): Payer: Medicare Other

## 2015-04-11 DIAGNOSIS — R296 Repeated falls: Secondary | ICD-10-CM

## 2015-04-11 DIAGNOSIS — R413 Other amnesia: Secondary | ICD-10-CM

## 2015-04-11 DIAGNOSIS — R42 Dizziness and giddiness: Secondary | ICD-10-CM | POA: Diagnosis not present

## 2015-04-11 DIAGNOSIS — E119 Type 2 diabetes mellitus without complications: Secondary | ICD-10-CM

## 2015-04-11 DIAGNOSIS — I951 Orthostatic hypotension: Secondary | ICD-10-CM

## 2015-04-11 LAB — BASIC METABOLIC PANEL
BUN: 8 mg/dL (ref 6–23)
CO2: 26 mEq/L (ref 19–32)
CREATININE: 0.77 mg/dL (ref 0.40–1.20)
Calcium: 10.1 mg/dL (ref 8.4–10.5)
Chloride: 100 mEq/L (ref 96–112)
GFR: 78.04 mL/min (ref >60.00)
Glucose, Bld: 113 mg/dL — ABNORMAL HIGH (ref 70–99)
POTASSIUM: 4.3 meq/L (ref 3.5–5.1)
Sodium: 133 mEq/L — ABNORMAL LOW (ref 135–145)

## 2015-04-11 LAB — CBC WITH DIFFERENTIAL/PLATELET
BASOS ABS: 0.1 10*3/uL (ref 0.0–0.1)
Basophils Relative: 0.5 % (ref 0.0–3.0)
EOS ABS: 0.1 10*3/uL (ref 0.0–0.7)
EOS PCT: 1.1 % (ref 0.0–5.0)
HCT: 39.7 % (ref 36.0–46.0)
Hemoglobin: 13.7 g/dL (ref 12.0–15.0)
LYMPHS ABS: 2.3 10*3/uL (ref 0.7–4.0)
LYMPHS PCT: 22.7 % (ref 12.0–46.0)
MCHC: 34.5 g/dL (ref 30.0–36.0)
MCV: 92.3 fl (ref 78.0–100.0)
Monocytes Absolute: 0.9 10*3/uL (ref 0.1–1.0)
Monocytes Relative: 8.6 % (ref 3.0–12.0)
Neutro Abs: 6.9 10*3/uL (ref 1.4–7.7)
Neutrophils Relative %: 67.1 % (ref 43.0–77.0)
PLATELETS: 332 10*3/uL (ref 150.0–400.0)
RBC: 4.31 Mil/uL (ref 3.87–5.11)
RDW: 13.1 % (ref 11.5–15.5)
WBC: 10.2 10*3/uL (ref 4.0–10.5)

## 2015-04-11 LAB — URINALYSIS, ROUTINE W REFLEX MICROSCOPIC
BILIRUBIN URINE: NEGATIVE
Hgb urine dipstick: NEGATIVE
KETONES UR: NEGATIVE
Leukocytes, UA: NEGATIVE
Nitrite: NEGATIVE
PH: 6 (ref 5.0–8.0)
Specific Gravity, Urine: <=1.005 — AB (ref 1.000–1.030)
Total Protein, Urine: NEGATIVE
Urine Glucose: NEGATIVE
Urobilinogen, UA: 0.2 (ref 0.0–1.0)

## 2015-04-11 LAB — HEPATIC FUNCTION PANEL
ALBUMIN: 4 g/dL (ref 3.5–5.2)
ALT: 7 U/L (ref 0–35)
AST: 21 U/L (ref 0–37)
Alkaline Phosphatase: 74 U/L (ref 39–117)
Bilirubin, Direct: 0.1 mg/dL (ref 0.0–0.3)
TOTAL PROTEIN: 7 g/dL (ref 6.0–8.3)
Total Bilirubin: 0.5 mg/dL (ref 0.2–1.2)

## 2015-04-11 LAB — TSH: TSH: 0.82 u[IU]/mL (ref 0.35–4.50)

## 2015-04-11 LAB — HEMOGLOBIN A1C: HEMOGLOBIN A1C: 5.8 % (ref 4.6–6.5)

## 2015-04-15 ENCOUNTER — Ambulatory Visit (INDEPENDENT_AMBULATORY_CARE_PROVIDER_SITE_OTHER): Payer: Medicare Other | Admitting: Internal Medicine

## 2015-04-15 ENCOUNTER — Encounter: Payer: Self-pay | Admitting: Internal Medicine

## 2015-04-15 VITALS — BP 146/86 | HR 75 | Temp 98.6°F | Wt 143.0 lb

## 2015-04-15 DIAGNOSIS — E119 Type 2 diabetes mellitus without complications: Secondary | ICD-10-CM

## 2015-04-15 DIAGNOSIS — R269 Unspecified abnormalities of gait and mobility: Secondary | ICD-10-CM | POA: Diagnosis not present

## 2015-04-15 DIAGNOSIS — R413 Other amnesia: Secondary | ICD-10-CM

## 2015-04-15 DIAGNOSIS — I10 Essential (primary) hypertension: Secondary | ICD-10-CM | POA: Diagnosis not present

## 2015-04-15 NOTE — Progress Notes (Signed)
Subjective:    Patient ID: Tara Neal, female    DOB: [birth year], 74 y.o.   MRN: [id number]  HPI  Here to f/u, much less orthostatic than last wk, but unfort still with significant gait disorder, prox leg weakness - tends to "flop" down with sitting, and memory difficulty persists.  No HA or other specific neuro symtpoms.  Wt up 4 lbs off diuretic, has really been working on pushing gatorade this past wk.  No further falls, but husband remains required to watch and support her with every standing up and walking.  Wt Readings from Last 3 Encounters:  04/15/15 143 lb (64.864 kg)  04/10/15 140 lb 0.6 oz (63.522 kg)  02/20/15 144 lb (65.318 kg)   Past Medical History  Diagnosis Date  . Hyperlipidemia   . Hypertension   . Mild obesity   . Spontaneous pneumothorax     1968  . GERD (gastroesophageal reflux disease)   . Osteoarthritis   . Low back pain   . Osteoporosis   . Hormone replacement therapy (postmenopausal)   . Anxiety   . Osteopenia   . COPD (chronic obstructive pulmonary disease)   . Diverticulosis of colon   . Shortness of breath     with exertion   . Anemia     hx of   . Chronic insomnia 04/22/2012  . Scoliosis   . Pneumonia     hx of 5 years ago   . Migraine headache     denies  . Umbilical hernia 7/82/4235  . Glaucoma   . Allergy    Past Surgical History  Procedure Laterality Date  . Tonsillectomy    . Lumbar disc surgery       x 2  . Other surgical history      benign growth removed from right under ear at age 85   . Excision/release bursa hip  04/26/2012    Procedure: EXCISION/RELEASE BURSA HIP;  Surgeon: Gearlean Alf, MD;  Location: [facility];  Service: Orthopedics;  Laterality: Right;  . Hip adductor tenotomy    . Eye surgery      - bil cataract surg and Retina surgery to left eye  . Tonsillectomy    . Hernia repair  36/14/4315    umbilical hernia  . Shoulder arthroscopy      left shoulder  . Wrist fracture surgery      plate in left wrist/  fractured 2 times    reports that she quit smoking about 33 years ago. Her smoking use included Cigarettes. She quit after 20 years of use. She has never used smokeless tobacco. She reports that she does not drink alcohol or use illicit drugs. family history includes Diabetes in her mother; Heart disease in her mother; Hyperlipidemia in her mother. Allergies  Allergen Reactions  . Codeine     REACTION: Nausea  . Penicillins     REACTION: Rash  . Sulfonamide Derivatives     REACTION: Nausea  . Percocet [Oxycodone-Acetaminophen] Itching   Current Outpatient Prescriptions on File Prior to Visit  Medication Sig Dispense Refill  . alendronate (FOSAMAX) 70 MG tablet Take 1 tablet (70 mg total) by mouth every 7 (seven) days. On Saturdays. Take with a full glass of water on an empty stomach. 12 tablet 3  . Ascorbic Acid (VITAMIN C) 1000 MG tablet Take 1,000 mg by mouth daily.    Marland Kitchen atenolol (TENORMIN) 50 MG tablet TAKE 1 TABLET  DAILY 90 tablet 3  .  benazepril (LOTENSIN) 40 MG tablet TAKE 1 TABLET EVERY DAY WITH BREAKFAST 90 tablet 1  . calcium carbonate (OS-CAL) 600 MG TABS Take 600 mg by mouth 2 (two) times daily with a meal.    . cetirizine (ZYRTEC) 10 MG tablet Take 10 mg by mouth daily with breakfast.     . Cholecalciferol (VITAMIN D3) 1000 UNITS CAPS Take 1 capsule by mouth daily.    . clarithromycin (BIAXIN) 250 MG tablet Take 1 tablet (250 mg total) by mouth 2 (two) times daily. 20 tablet 0  . HYDROcodone-homatropine (HYCODAN) 5-1.5 MG/5ML syrup Take 5 mLs by mouth every 8 (eight) hours as needed for cough. 120 mL 0  . latanoprost (XALATAN) 0.005 % ophthalmic solution Place 1 drop into both eyes at bedtime.     . meloxicam (MOBIC) 15 MG tablet TAKE 1 TABLET EVERY DAY WITH BREAKFAST 90 tablet 1  . methocarbamol (ROBAXIN) 500 MG tablet Take 500 mg by mouth every 6 (six) hours as needed for muscle spasms.     . Multiple Vitamin (MULTIVITAMIN) tablet Take 1 tablet by mouth daily.    Marland Kitchen  nystatin (MYCOSTATIN) 100000 UNIT/ML suspension Take 5 mLs (500,000 Units total) by mouth 4 (four) times daily. 60 mL 0  . Omega-3 Fatty Acids (FISH OIL) 1200 MG CAPS Take by mouth. Take 2 a day    . omeprazole (PRILOSEC) 20 MG capsule TAKE 2 CAPSULES EVERY DAY 180 capsule 1  . simvastatin (ZOCOR) 40 MG tablet TAKE 1 TABLET EVERY DAY WITH BREAKFAST 90 tablet 1  . timolol (TIMOPTIC) 0.5 % ophthalmic solution Place 1 drop into both eyes daily.    Marland Kitchen venlafaxine XR (EFFEXOR-XR) 150 MG 24 hr capsule TAKE 1 CAPSULE EVERY DAY WITH BREAKFAST 90 capsule 1   No current facility-administered medications on file prior to visit.   Review of Systems  Constitutional: Negative for unusual diaphoresis or night sweats HENT: Negative for ringing in ear or discharge Eyes: Negative for double vision or worsening visual disturbance.  Respiratory: Negative for choking and stridor.   Gastrointestinal: Negative for vomiting or other signifcant bowel change Genitourinary: Negative for hematuria or change in urine volume.  Musculoskeletal: Negative for other MSK pain or swelling Skin: Negative for color change and worsening wound.  Neurological: Negative for tremors and numbness other than noted  Psychiatric/Behavioral: Negative for decreased concentration or agitation other than above       Objective:   Physical Exam BP 146/86 mmHg  Pulse 75  Temp(Src) 98.6 F (37 C) (Oral)  Wt 143 lb (64.864 kg)  SpO2 98% BP Readings from Last 3 Encounters:  04/15/15 146/86  04/10/15 172/98  02/20/15 122/74  VS noted,  Constitutional: Pt appears in no significant distress HENT: Head: NCAT.  Right Ear: External ear normal.  Left Ear: External ear normal.  Eyes: . Pupils are equal, round, and reactive to light. Conjunctivae and EOM are normal Neck: Normal range of motion. Neck supple.  Cardiovascular: Normal rate and regular rhythm.   Pulmonary/Chest: Effort normal and breath sounds without rales or wheezing.  Abd:   Soft, NT, ND, + BS Neurological: Pt is alert. + mild confused , motor grossly intact, gait less staggery and weak but still unsteady , uses husband for support Skin: Skin is warm. No rash, no LE edema Psychiatric: Pt behavior is normal. No agitation.  Lab Results  Component Value Date   WBC 10.2 04/11/2015   HGB 13.7 04/11/2015   HCT 39.7 04/11/2015   PLT 332.0 04/11/2015  GLUCOSE 113* 04/11/2015   CHOL 172 06/06/2014   TRIG 429.0* 06/06/2014   HDL 39.30 06/06/2014   LDLDIRECT 69.4 12/21/2012   LDLCALC 47 06/06/2014   ALT 7 04/11/2015   AST 21 04/11/2015   NA 133* 04/11/2015   K 4.3 04/11/2015   CL 100 04/11/2015   CREATININE 0.77 04/11/2015   BUN 8 04/11/2015   CO2 26 04/11/2015   TSH 0.82 04/11/2015   INR 1.02 08/22/2012   HGBA1C 5.8 04/11/2015   MICROALBUR 0.6 12/13/2011   UA - neg with visit last wk    Assessment & Plan:

## 2015-04-15 NOTE — Assessment & Plan Note (Signed)
Mild elev today, ok to hold on furhter med at this time

## 2015-04-15 NOTE — Assessment & Plan Note (Signed)
Etiology unclear, will refer neurology

## 2015-04-15 NOTE — Progress Notes (Signed)
Pre visit review using our clinic review tool, if applicable. No additional management support is needed unless otherwise documented below in the visit note. 

## 2015-04-15 NOTE — Patient Instructions (Signed)
Please continue all other medications as before, and refills have been done if requested (OK to continue to hold the fluid pill)  Please have the pharmacy call with any other refills you may need.  Please continue your efforts at being more active, low cholesterol diet, and weight control  Please keep your appointments with your specialists as you may have planned  You will be contacted regarding the referral for: Neurology, MRI head and outpatient Physical Therapy

## 2015-04-15 NOTE — Assessment & Plan Note (Signed)
stable overall by history and exam, recent data reviewed with pt, and pt to continue medical treatment as before,  to f/u any worsening symptoms or concerns Lab Results  Component Value Date   HGBA1C 5.8 04/11/2015

## 2015-04-15 NOTE — Assessment & Plan Note (Signed)
For PT outpatient, and neurology/head MRi as above

## 2015-04-21 DIAGNOSIS — Z961 Presence of intraocular lens: Secondary | ICD-10-CM | POA: Diagnosis not present

## 2015-04-21 DIAGNOSIS — R42 Dizziness and giddiness: Secondary | ICD-10-CM | POA: Diagnosis not present

## 2015-04-21 DIAGNOSIS — H4011X2 Primary open-angle glaucoma, moderate stage: Secondary | ICD-10-CM | POA: Diagnosis not present

## 2015-04-22 DIAGNOSIS — L821 Other seborrheic keratosis: Secondary | ICD-10-CM | POA: Diagnosis not present

## 2015-04-22 DIAGNOSIS — L905 Scar conditions and fibrosis of skin: Secondary | ICD-10-CM | POA: Diagnosis not present

## 2015-04-24 ENCOUNTER — Ambulatory Visit (INDEPENDENT_AMBULATORY_CARE_PROVIDER_SITE_OTHER): Payer: Medicare Other | Admitting: Neurology

## 2015-04-24 ENCOUNTER — Encounter: Payer: Self-pay | Admitting: Neurology

## 2015-04-24 VITALS — Ht 64.0 in | Wt 141.0 lb

## 2015-04-24 DIAGNOSIS — R292 Abnormal reflex: Secondary | ICD-10-CM | POA: Diagnosis not present

## 2015-04-24 DIAGNOSIS — E8889 Other specified metabolic disorders: Secondary | ICD-10-CM | POA: Diagnosis not present

## 2015-04-24 DIAGNOSIS — R270 Ataxia, unspecified: Secondary | ICD-10-CM

## 2015-04-24 DIAGNOSIS — G629 Polyneuropathy, unspecified: Secondary | ICD-10-CM

## 2015-04-24 DIAGNOSIS — M542 Cervicalgia: Secondary | ICD-10-CM | POA: Diagnosis not present

## 2015-04-24 DIAGNOSIS — G609 Hereditary and idiopathic neuropathy, unspecified: Secondary | ICD-10-CM | POA: Diagnosis not present

## 2015-04-24 DIAGNOSIS — W19XXXA Unspecified fall, initial encounter: Secondary | ICD-10-CM

## 2015-04-24 LAB — FOLATE

## 2015-04-24 LAB — VITAMIN B12: Vitamin B-12: 444 pg/mL (ref 211–911)

## 2015-04-24 NOTE — Progress Notes (Signed)
Tara Neal was seen today in the movement disorders clinic for neurologic consultation at the request of [name], MD.  This patient is accompanied in the office by her spouse who supplements the history.  The records that were made available to me were reviewed  The consultation is for the evaluation of gait change. Pt states that sx's started 3 weeks ago and states that she had no problems at all before that but husband points out that over a year ago, she had fx of the wrist from a fall.  He thinks that this has been going on for much longer than 3 weeks (estimates slowly for last few years).  Her husband does state that she went to get up from the chair 3 weeks and she felt off balance.  She was "dizzy" but denies vertigo.  Each time she got up she got that feeling; she initially stated that she never had it lying down or seated but then her husband said that she did and she agreed that it was that way but isn't any more.  .The patient saw Dr. [name] on 04/10/15 and was c/o dizziness.  She was dx with OH and her lasix was d/c.  She states that she thinks it helped somewhat but she still gets some dizziness when she stands up but it seemed to help the sitting/laying.  She denies n/v.  No palpitations.  No weakness or paresthesias that lateralize.  She denies speech/swallowing problems but her husband interjects that she "gets choked" sometimes when eating.  Her husband states that the patient believes that she does not walk well because she cannot see well, but she just went to the eye doctor a few weeks ago and her vision was not changed and was unremarkable.  She has an MRI brain pending.    ALLERGIES:   Allergies  Allergen Reactions  . Codeine     REACTION: Nausea  . Penicillins     REACTION: Rash  . Sulfonamide Derivatives     REACTION: Nausea  . Percocet [Oxycodone-Acetaminophen] Itching    CURRENT MEDICATIONS:  Outpatient Encounter Prescriptions as of 04/24/2015  Medication Sig  .  alendronate (FOSAMAX) 70 MG tablet Take 1 tablet (70 mg total) by mouth every 7 (seven) days. On Saturdays. Take with a full glass of water on an empty stomach.  . Ascorbic Acid (VITAMIN C) 1000 MG tablet Take 1,000 mg by mouth daily.  Marland Kitchen atenolol (TENORMIN) 50 MG tablet TAKE 1 TABLET  DAILY  . benazepril (LOTENSIN) 40 MG tablet TAKE 1 TABLET EVERY DAY WITH BREAKFAST  . calcium carbonate (OS-CAL) 600 MG TABS Take 600 mg by mouth 2 (two) times daily with a meal.  . cetirizine (ZYRTEC) 10 MG tablet Take 10 mg by mouth daily with breakfast.   . Cholecalciferol (VITAMIN D3) 1000 UNITS CAPS Take 1 capsule by mouth daily.  Marland Kitchen latanoprost (XALATAN) 0.005 % ophthalmic solution Place 1 drop into both eyes at bedtime.   . meloxicam (MOBIC) 15 MG tablet TAKE 1 TABLET EVERY DAY WITH BREAKFAST  . Multiple Vitamin (MULTIVITAMIN) tablet Take 1 tablet by mouth daily.  . Omega-3 Fatty Acids (FISH OIL) 1200 MG CAPS Take by mouth. Take 2 a day  . omeprazole (PRILOSEC) 20 MG capsule TAKE 2 CAPSULES EVERY DAY  . simvastatin (ZOCOR) 40 MG tablet TAKE 1 TABLET EVERY DAY WITH BREAKFAST  . timolol (TIMOPTIC) 0.5 % ophthalmic solution Place 1 drop into both eyes daily.  Marland Kitchen venlafaxine XR (  EFFEXOR-XR) 150 MG 24 hr capsule TAKE 1 CAPSULE EVERY DAY WITH BREAKFAST  . [DISCONTINUED] clarithromycin (BIAXIN) 250 MG tablet Take 1 tablet (250 mg total) by mouth 2 (two) times daily.  . [DISCONTINUED] HYDROcodone-homatropine (HYCODAN) 5-1.5 MG/5ML syrup Take 5 mLs by mouth every 8 (eight) hours as needed for cough.  . [DISCONTINUED] methocarbamol (ROBAXIN) 500 MG tablet Take 500 mg by mouth every 6 (six) hours as needed for muscle spasms.   . [DISCONTINUED] nystatin (MYCOSTATIN) 100000 UNIT/ML suspension Take 5 mLs (500,000 Units total) by mouth 4 (four) times daily.   No facility-administered encounter medications on file as of 04/24/2015.    PAST MEDICAL HISTORY:   Past Medical History  Diagnosis Date  . Hyperlipidemia   .  Hypertension   . Mild obesity   . Spontaneous pneumothorax     1968  . GERD (gastroesophageal reflux disease)   . Osteoarthritis   . Low back pain   . Osteoporosis   . Hormone replacement therapy (postmenopausal)   . Anxiety   . Osteopenia   . COPD (chronic obstructive pulmonary disease)   . Diverticulosis of colon   . Shortness of breath     with exertion   . Anemia     hx of   . Chronic insomnia 04/22/2012  . Scoliosis   . Pneumonia     hx of 5 years ago   . Migraine headache     denies  . Umbilical hernia 0/86/5784  . Glaucoma   . Allergy     PAST SURGICAL HISTORY:   Past Surgical History  Procedure Laterality Date  . Tonsillectomy    . Lumbar disc surgery       x 2  . Other surgical history      benign growth removed from right under ear at age 18   . Excision/release bursa hip  04/26/2012    Procedure: EXCISION/RELEASE BURSA HIP;  Surgeon: Gearlean Alf, MD;  Location: [facility];  Service: Orthopedics;  Laterality: Right;  . Hip adductor tenotomy    . Eye surgery      - bil cataract surg and Retina surgery to left eye  . Tonsillectomy    . Hernia repair  69/62/9528    umbilical hernia  . Shoulder arthroscopy      left shoulder  . Wrist fracture surgery      plate in left wrist/ fractured 2 times    SOCIAL HISTORY:   History   Social History  . Marital Status: Married    Spouse Name: N/A  . Number of Children: N/A  . Years of Education: N/A   Occupational History  . Not on file.   Social History Main Topics  . Smoking status: Former Smoker -- 20 years    Types: Cigarettes    Quit date: 11/29/1981  . Smokeless tobacco: Never Used  . Alcohol Use: Yes     Comment: one glass of wine every 3 months  . Drug Use: No  . Sexual Activity: Not on file   Other Topics Concern  . 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 Mother Deceased     heart  disease, DM  . Father Deceased     killed in war  . Son Alive     back problems  . Daughter Alive     breast cancer  ROS:  A complete 10 system review of systems was obtained and was unremarkable apart from what is mentioned above.  PHYSICAL EXAMINATION:    VITALS:   Filed Vitals:   04/24/15 1335  Height: 5\' 4"  (1.626 m)  Weight: 141 lb (63.957 kg)    GEN:  The patient appears stated age and is in NAD. HEENT:  Normocephalic, atraumatic.  The mucous membranes are moist. The superficial temporal arteries are without ropiness or tenderness. CV:  RRR Lungs:  CTAB Neck/HEME:  There are no carotid bruits bilaterally.  Neurological examination:  Orientation:  Montreal Cognitive Assessment  04/24/2015  Visuospatial/ Executive (0/5) 4  Naming (0/3) 2  Attention: Read list of digits (0/2) 1  Attention: Read list of letters (0/1) 1  Attention: Serial 7 subtraction starting at 100 (0/3) 0  Language: Repeat phrase (0/2) 2  Language : Fluency (0/1) 0  Abstraction (0/2) 2  Delayed Recall (0/5) 4  Orientation (0/6) 6  Total 22  Adjusted Score (based on education) 23   The patient was undressed and placed into examining shorts.   Cranial nerves: There is good facial symmetry with the exception of slight decreased nasolabial fold on the right, resulting in minor facial droop around the right lip.  When she activates these muscles with smile, however, there is good facial symmetry. Pupils are equal round and reactive to light bilaterally. Fundoscopic exam reveals clear margins bilaterally.  The patient has decreased upgaze, but otherwise extraocular muscles are intact.  She has difficulty participating with formal confrontational testing. The speech is fluent and clear. Soft palate rises symmetrically and there is no tongue deviation. Hearing is intact to conversational tone. Sensation: Sensation is intact to light and pinprick throughout (facial, trunk, extremities). Vibration is  markedly decreased in a distal fashion. There is no extinction with double simultaneous stimulation. There is no sensory dermatomal level identified. Motor: Strength is 5/5 in the bilateral upper and lower extremities.   Shoulder shrug is equal and symmetric.  There is no pronator drift.  Even when undressed, no fasciculations are noted. Deep tendon reflexes: Deep tendon reflexes are 3/4 at the bilateral biceps, triceps, brachioradialis, patella and 1/4 at the bilateral achilles. Plantar responses are downgoing bilaterally.  There is no Hoffmans or Tromner's response bilaterally.  Movement examination: Tone: There is normal tone in the bilateral upper extremities.  The tone in the lower extremities is normal.  Abnormal movements: None Coordination:  There is no significant decremation with RAM's, with any formal rectal movements, including alternating supination and pronation of the forearm, hand opening and closing, finger taps, heel taps and toe taps. Gait and Station: The patient has some difficulty arising out of a deep-seated chair without the use of the hands.  She is mildly ataxic when she walks.    A Dix-Hallpike maneuver was performed in the office today and was unremarkable.  This did not elicit her symptoms of dizziness, nor did it elicit nystagmus.  Lab Results  Component Value Date   OVZCHYIF02 774 12/21/2012   Lab Results  Component Value Date   TSH 0.82 04/11/2015   Lab Results  Component Value Date   HGBA1C 5.8 04/11/2015      ASSESSMENT/PLAN:  1.  Gait instability  -Her Dix-Hallpike maneuver was negative.  She was not orthostatic in the office today.  There was definite features of peripheral neuropathy, so she will have an EMG and lab work.  We discussed safety.  -Given significant hyperreflexia (especially in the face  of a possible peripheral neuropathy), the potential acute nature (although husband thinks going on for years), and slight facial asymmetry, I agree  with the neuro-imaging of the brain and I am going to add gadolinium to that.  I will also do an MRA given her complaints of dizziness, although I was not able to elicit that today.  We will also do an MRI of the cervical spine given neck pain and hyperreflexia.  -we will do a carotid u/s.  -further recommendations, including therapy, will follow the above testing.  Greater than 50% of 60 min in counseling and coordinating care.

## 2015-04-24 NOTE — Patient Instructions (Signed)
1. Your provider has requested that you have labwork completed today. Please go to [facility] on the first floor of this building before leaving the office today. 2. We will call you with an appt for EMG.  3. We have scheduled you at [facility] for your MRA Head, MR Brain and MR Cervical Spine on 05/09/2015 at 2:00 pm. Please arrive 15 minutes prior and go to 1st floor radiology. If you need to reschedule for any reason please call [phone].

## 2015-04-25 ENCOUNTER — Telehealth: Payer: Self-pay | Admitting: Neurology

## 2015-04-25 DIAGNOSIS — R42 Dizziness and giddiness: Secondary | ICD-10-CM

## 2015-04-25 LAB — RPR

## 2015-04-25 NOTE — Telephone Encounter (Signed)
Pt returned Jades call, would like a call back at [phone]

## 2015-04-25 NOTE — Telephone Encounter (Signed)
I called patient back and gave her the appointment information.  Phone number also given.

## 2015-04-25 NOTE — Telephone Encounter (Signed)
-----   Message from [place], DO sent at 04/24/2015  3:24 PM EDT ----- Sorry about this.  Want to add carotid u/s for dizziness

## 2015-04-25 NOTE — Telephone Encounter (Signed)
Left message on machine for patient to call back. To make her aware she needs Carotid US and information about appt date/time/location is below:  We have you scheduled for your Carotid Doppler on 05/02/2015 at 2:00 pm. Please arrive 15 minutes prior and go to [facility], section A. If this is not a good date/time please call [phone] to reschedule.  Awaiting call back.

## 2015-04-29 LAB — UIFE/LIGHT CHAINS/TP QN, 24-HR UR
Albumin, U: DETECTED
Alpha 1, Urine: DETECTED — AB
Alpha 2, Urine: DETECTED — AB
Beta, Urine: DETECTED — AB
Gamma Globulin, Urine: DETECTED — AB
Total Protein, Urine: 9 mg/dL (ref 5–24)

## 2015-04-30 ENCOUNTER — Telehealth: Payer: Self-pay | Admitting: Neurology

## 2015-04-30 LAB — SPEP & IFE WITH QIG
ALPHA-1-GLOBULIN: 0.3 g/dL (ref 0.2–0.3)
Albumin ELP: 4 g/dL (ref 3.8–4.8)
Alpha-2-Globulin: 0.7 g/dL (ref 0.5–0.9)
Beta 2: 0.3 g/dL (ref 0.2–0.5)
Beta Globulin: 0.4 g/dL (ref 0.4–0.6)
GAMMA GLOBULIN: 0.8 g/dL (ref 0.8–1.7)
IgA: 255 mg/dL (ref 69–380)
IgG (Immunoglobin G), Serum: 970 mg/dL (ref 690–1700)
IgM, Serum: 57 mg/dL (ref 52–322)
TOTAL PROTEIN, SERUM ELECTROPHOR: 6.5 g/dL (ref 6.1–8.1)

## 2015-04-30 NOTE — Telephone Encounter (Signed)
Pt is returning your call she states please call her at [phone]

## 2015-05-01 ENCOUNTER — Telehealth: Payer: Self-pay | Admitting: Internal Medicine

## 2015-05-01 NOTE — Telephone Encounter (Signed)
[facility] called and said that they do not have the right equipment for pt.  This pt is going to need parallel bars and they do not have them.  She needs to be referred to [facility]

## 2015-05-01 NOTE — Telephone Encounter (Signed)
Pt husband has been advised. Pt is currently being treated by Dr. Carles Collet and would like to hold off on rehab referral right now.

## 2015-05-02 ENCOUNTER — Telehealth: Payer: Self-pay | Admitting: Neurology

## 2015-05-02 ENCOUNTER — Ambulatory Visit (HOSPITAL_COMMUNITY)
Admission: RE | Admit: 2015-05-02 | Discharge: 2015-05-02 | Disposition: A | Discharge status: Home / Self Care | Payer: Medicare Other | Source: Ambulatory Visit | Attending: Neurology | Admitting: Neurology

## 2015-05-02 DIAGNOSIS — I6523 Occlusion and stenosis of bilateral carotid arteries: Secondary | ICD-10-CM | POA: Diagnosis not present

## 2015-05-02 DIAGNOSIS — R42 Dizziness and giddiness: Secondary | ICD-10-CM | POA: Insufficient documentation

## 2015-05-02 NOTE — Telephone Encounter (Signed)
[facility] called to let Dr Tat that her Carotid test was normal/Dawn

## 2015-05-02 NOTE — Progress Notes (Signed)
Preliminary results by tech - Carotid Duplex Completed. Mild plaque bilaterally with no evidence of a significant stenosis noted.  Bilateral vertebral arteries demonstrate normal antegrade flow. Oda Cogan, BS, RDMS, RVT

## 2015-05-02 NOTE — Telephone Encounter (Signed)
Will await written report and call patient with results once reviewed by Dr Tat.

## 2015-05-03 ENCOUNTER — Other Ambulatory Visit: Payer: Medicare Other

## 2015-05-05 ENCOUNTER — Telehealth: Payer: Self-pay | Admitting: Neurology

## 2015-05-05 NOTE — Telephone Encounter (Signed)
-----   Message from [place], DO sent at 05/05/2015  7:34 AM EDT ----- Please let pt know that her carotid u/s looks good

## 2015-05-05 NOTE — Telephone Encounter (Signed)
Patient made aware results normal.

## 2015-05-06 ENCOUNTER — Telehealth: Payer: Self-pay | Admitting: Neurology

## 2015-05-06 ENCOUNTER — Ambulatory Visit (HOSPITAL_COMMUNITY): Payer: Medicare Other

## 2015-05-06 ENCOUNTER — Ambulatory Visit (INDEPENDENT_AMBULATORY_CARE_PROVIDER_SITE_OTHER): Payer: Medicare Other | Admitting: Neurology

## 2015-05-06 DIAGNOSIS — R292 Abnormal reflex: Secondary | ICD-10-CM

## 2015-05-06 DIAGNOSIS — W19XXXA Unspecified fall, initial encounter: Secondary | ICD-10-CM

## 2015-05-06 DIAGNOSIS — G629 Polyneuropathy, unspecified: Secondary | ICD-10-CM

## 2015-05-06 DIAGNOSIS — M542 Cervicalgia: Secondary | ICD-10-CM

## 2015-05-06 DIAGNOSIS — R270 Ataxia, unspecified: Secondary | ICD-10-CM

## 2015-05-06 NOTE — Telephone Encounter (Signed)
-----   Message from [place], DO sent at 05/06/2015  3:06 PM EDT ----- Please let pt/husband know that EMG showed severe PN.  We previously discussed PN in office.  Let them know that this is not something that just developed but can contribute/cause someone to be significantly off balance.  PT is only help and walker.

## 2015-05-06 NOTE — Telephone Encounter (Signed)
Patient made aware of results. [facility]. Referral entered and they will call directly with appt.

## 2015-05-06 NOTE — Telephone Encounter (Signed)
Left message on machine for patient to call back.

## 2015-05-06 NOTE — Procedures (Addendum)
[facility]  [address]  [place], [place] [address] Tel: [phone] Fax:  [phone] Test Date:  05/06/2015  Patient: Monigue Spraggins DOB: [birth year] Physician: Narda Amber, DO  Sex: Female Height: 5\' 4"  Ref Phys: Alonza Bogus, M.D.  ID#: [id number] Temp: 35.0C Technician: Jerilynn Mages. Dean   Patient Complaints: This is a 74 year old female presenting for evaluation of bilateral leg weakness and gait instability.   NCV & EMG Findings: Sensitive electrodiagnostic testing of the right lower extremity and additional studies of the left shows:  1. Bilateral sural and superficial peroneal sensory responses are absent.  2. Bilateral peroneal and tibial motor responses are within normal limits.  3. Bilateral H reflex studies are within normal limits.  4. Chronic motor axonal loss changes are seen affecting the muscles below the knee bilaterally conforming to gradient pattern, without accompanied active denervation.   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 Overall, these findings are severe in degree electrically with respect to sensory responses.   ___________________________ Narda Amber, DO    Nerve Conduction Studies Anti Sensory Summary Table   Site NR Peak (ms) Norm Peak (ms) P-T Amp (V) Norm P-T Amp  Left Sup Peroneal Anti Sensory (Ant Lat Mall)  12 cm NR  <4.6  >3  Right Sup Peroneal Anti Sensory (Ant Lat Mall)  12 cm NR  <4.6  >3  Left Sural Anti Sensory (Lat Mall)  Calf NR  <4.6  >3  Right Sural Anti Sensory (Lat Mall)  Calf NR  <4.6  >3   Motor Summary Table   Site NR Onset (ms) Norm Onset (ms) O-P Amp (mV) Norm O-P Amp Site1 Site2 Delta-0 (ms) Dist (cm) Vel (m/s) Norm Vel (m/s)  Left Peroneal Motor (Ext Dig Brev)  Ankle    3.5 <6.0 2.6 >2.5 B Fib Ankle 7.2 32.0 44 >40  B Fib    10.7  2.2  Poplt B Fib 1.7 10.0 59 >40  Poplt     12.4  2.0         Right Peroneal Motor (Ext Dig Brev)  Ankle    4.1 <6.0 2.7 >2.5 B Fib Ankle 6.3 30.0 48 >40  B Fib    10.4  2.4  Poplt B Fib 1.9 10.0 53 >40  Poplt    12.3  2.2         Left Tibial Motor (Abd Hall Brev)  Ankle    3.7 <6.0 7.8 >4 Knee Ankle 7.9 32.0 41 >40  Knee    11.6  6.6         Right Tibial Motor (Abd Hall Brev)  Ankle    4.0 <6.0 8.4 >4 Knee Ankle 8.0 38.0 48 >40  Knee    12.0  4.7          H Reflex Studies   NR H-Lat (ms) Lat Norm (ms) L-R H-Lat (ms)  Left Tibial (Gastroc)     33.74 <35 0.00  Right Tibial (Gastroc)     33.74 <35 0.00   EMG   Side Muscle Ins Act Fibs Psw Fasc Number Recrt Dur Dur. Amp Amp. Poly Poly. Comment  Right AntTibialis Nml Nml Nml Nml 1- Rapid Some 1+ Some 1+ Nml Nml N/A  Right Gastroc Nml Nml Nml Nml 1- Mod-R Some 1+ Some 1+ Nml Nml N/A  Right Flex Dig Long Nml Nml Nml Nml 2- Rapid Some 1+ Some 1+  Nml Nml N/A  Right RectFemoris Nml Nml Nml Nml Nml Nml Nml Nml Nml Nml Nml Nml N/A  Right GluteusMed Nml Nml Nml Nml Nml Nml Nml Nml Nml Nml Nml Nml N/A  Right BicepsFemS Nml Nml Nml Nml Nml Nml Nml Nml Nml Nml Nml Nml N/A  Left AntTibialis Nml Nml Nml Nml 1- Rapid Some 1+ Some 1+ Nml Nml N/A  Left Gastroc Nml Nml Nml Nml 1- Mod-R Some 1+ Nml Nml Nml Nml N/A  Left RectFemoris Nml Nml Nml Nml Nml Nml Nml Nml Nml Nml Nml Nml N/A      Waveforms:

## 2015-05-07 NOTE — Telephone Encounter (Signed)
Pt appt scheduled  Tara Neal

## 2015-05-09 ENCOUNTER — Ambulatory Visit (HOSPITAL_COMMUNITY)
Admission: RE | Admit: 2015-05-09 | Discharge: 2015-05-09 | Disposition: A | Discharge status: Home / Self Care | Payer: Medicare Other | Source: Ambulatory Visit | Attending: Neurology | Admitting: Neurology

## 2015-05-09 ENCOUNTER — Other Ambulatory Visit: Payer: Self-pay | Admitting: Neurology

## 2015-05-09 ENCOUNTER — Ambulatory Visit (HOSPITAL_COMMUNITY): Payer: Medicare Other

## 2015-05-09 DIAGNOSIS — W19XXXA Unspecified fall, initial encounter: Secondary | ICD-10-CM | POA: Diagnosis not present

## 2015-05-09 DIAGNOSIS — M4806 Spinal stenosis, lumbar region: Secondary | ICD-10-CM | POA: Insufficient documentation

## 2015-05-09 DIAGNOSIS — M542 Cervicalgia: Secondary | ICD-10-CM

## 2015-05-09 DIAGNOSIS — R292 Abnormal reflex: Secondary | ICD-10-CM | POA: Diagnosis not present

## 2015-05-09 DIAGNOSIS — S0990XA Unspecified injury of head, initial encounter: Secondary | ICD-10-CM | POA: Diagnosis not present

## 2015-05-09 DIAGNOSIS — D1809 Hemangioma of other sites: Secondary | ICD-10-CM | POA: Diagnosis not present

## 2015-05-09 DIAGNOSIS — M4317 Spondylolisthesis, lumbosacral region: Secondary | ICD-10-CM | POA: Insufficient documentation

## 2015-05-09 DIAGNOSIS — R270 Ataxia, unspecified: Secondary | ICD-10-CM

## 2015-05-09 DIAGNOSIS — M5126 Other intervertebral disc displacement, lumbar region: Secondary | ICD-10-CM | POA: Diagnosis not present

## 2015-05-09 DIAGNOSIS — M419 Scoliosis, unspecified: Secondary | ICD-10-CM | POA: Diagnosis not present

## 2015-05-09 DIAGNOSIS — M5030 Other cervical disc degeneration, unspecified cervical region: Secondary | ICD-10-CM | POA: Insufficient documentation

## 2015-05-09 DIAGNOSIS — M545 Low back pain: Secondary | ICD-10-CM | POA: Diagnosis not present

## 2015-05-09 DIAGNOSIS — R296 Repeated falls: Secondary | ICD-10-CM | POA: Diagnosis not present

## 2015-05-09 MED ORDER — GADOBENATE DIMEGLUMINE 529 MG/ML IV SOLN: 15.0000 mL | Freq: Once | INTRAVENOUS | Status: AC | PRN

## 2015-05-12 ENCOUNTER — Telehealth: Payer: Self-pay | Admitting: Neurology

## 2015-05-12 NOTE — Telephone Encounter (Signed)
Patient made aware of results and would like neurosurgery referral. Referral completed and faxed with notes to [phone] with confirmation received. Patient given the number to contact them if she does not hear from them about appt. She will call with any further questions.

## 2015-05-12 NOTE — Telephone Encounter (Signed)
Jade, please call pt/husband and let them know that MRI cervical and lumbar spine showed pretty severe degenerative changes and spinal stenosis.  Had significant scoliosis in lumbar spine as well.  I think that c-spine changes could be reason for the hyperreflexia.  Brain just showed mod small vessel disease (not unexpected).  If agreeable, needs neurosx consult with Dr. [name]

## 2015-05-26 ENCOUNTER — Other Ambulatory Visit: Payer: Self-pay

## 2015-06-05 ENCOUNTER — Ambulatory Visit (INDEPENDENT_AMBULATORY_CARE_PROVIDER_SITE_OTHER): Payer: Medicare Other | Admitting: Internal Medicine

## 2015-06-05 ENCOUNTER — Encounter: Payer: Self-pay | Admitting: Internal Medicine

## 2015-06-05 VITALS — BP 118/66 | HR 65 | Temp 97.8°F | Ht 64.0 in | Wt 145.0 lb

## 2015-06-05 DIAGNOSIS — M79642 Pain in left hand: Secondary | ICD-10-CM | POA: Diagnosis not present

## 2015-06-05 DIAGNOSIS — I10 Essential (primary) hypertension: Secondary | ICD-10-CM | POA: Diagnosis not present

## 2015-06-05 DIAGNOSIS — E119 Type 2 diabetes mellitus without complications: Secondary | ICD-10-CM

## 2015-06-05 DIAGNOSIS — Z23 Encounter for immunization: Secondary | ICD-10-CM

## 2015-06-05 DIAGNOSIS — M79641 Pain in right hand: Secondary | ICD-10-CM | POA: Diagnosis not present

## 2015-06-05 MED ORDER — OMEPRAZOLE 20 MG PO CPDR: 40.0000 mg | DELAYED_RELEASE_CAPSULE | Freq: Every day | ORAL | Status: DC

## 2015-06-05 MED ORDER — BENAZEPRIL HCL 40 MG PO TABS: 40.0000 mg | ORAL_TABLET | Freq: Every day | ORAL | Status: DC

## 2015-06-05 MED ORDER — ALENDRONATE SODIUM 70 MG PO TABS: 70.0000 mg | ORAL_TABLET | ORAL | Status: DC

## 2015-06-05 MED ORDER — SIMVASTATIN 40 MG PO TABS: 40.0000 mg | ORAL_TABLET | Freq: Every day | ORAL | Status: DC

## 2015-06-05 MED ORDER — VENLAFAXINE HCL ER 150 MG PO CP24: 150.0000 mg | ORAL_CAPSULE | Freq: Every day | ORAL | Status: DC

## 2015-06-05 MED ORDER — PNEUMOCOCCAL 13-VAL CONJ VACC IM SUSP
0.5000 mL | Freq: Once | INTRAMUSCULAR | Status: AC
  Administered 2015-06-05: 0.5 mL via INTRAMUSCULAR

## 2015-06-05 MED ORDER — DICLOFENAC SODIUM 1 % TD GEL: 4.0000 g | Freq: Four times a day (QID) | TRANSDERMAL | Status: DC | PRN

## 2015-06-05 MED ORDER — ATENOLOL 50 MG PO TABS: 50.0000 mg | ORAL_TABLET | Freq: Every day | ORAL | Status: DC

## 2015-06-05 NOTE — Assessment & Plan Note (Signed)
stable overall by history and exam, recent data reviewed with pt, and pt to continue medical treatment as before,  to f/u any worsening symptoms or concerns Lab Results  Component Value Date   HGBA1C 5.8 04/11/2015

## 2015-06-05 NOTE — Progress Notes (Signed)
Subjective:    Patient ID: Tara Neal, female    DOB: Mar 13, 1941, 74 y.o.   MRN: [id number]  HPI  Here with bilat hand pain - mostly specific joints tender of the PIP's both hands without effusions, trauma, fever or hx of gout.  Also has tender with swelling at [facility] right thumb with some swelling as well extending to distal wrist/arm along the first dorsal compartment. No recent overuse, but also incidetnly with bilat hand numbness to most of both hands with some discomfort as well, but no weakness, skin change, redness or other swelling. To see Dr [name] July 25 for NS eval for spine pain. Pt denies chest pain, increased sob or doe, wheezing, orthopnea, PND, increased LE swelling, palpitations, dizziness or syncope.   Pt denies polydipsia, polyuria,exercise however.      Past Medical History  Diagnosis Date  . Hyperlipidemia   . Hypertension   . Mild obesity   . Spontaneous pneumothorax     1968  . GERD (gastroesophageal reflux disease)   . Osteoarthritis   . Low back pain   . Osteoporosis   . Hormone replacement therapy (postmenopausal)   . Anxiety   . Osteopenia   . COPD (chronic obstructive pulmonary disease)   . Diverticulosis of colon   . Shortness of breath     with exertion   . Anemia     hx of   . Chronic insomnia 04/22/2012  . Scoliosis   . Pneumonia     hx of 5 years ago   . Migraine headache     denies  . Umbilical hernia 2/50/5397  . Glaucoma   . Allergy    Past Surgical History  Procedure Laterality Date  . Tonsillectomy    . Lumbar disc surgery       x 2  . Other surgical history      benign growth removed from right under ear at age 33   . Excision/release bursa hip  04/26/2012    Procedure: EXCISION/RELEASE BURSA HIP;  Surgeon: Gearlean Alf, MD;  Location: [facility];  Service: Orthopedics;  Laterality: Right;  . Hip adductor tenotomy    . Eye surgery      - bil cataract surg and Retina surgery to left eye  . Tonsillectomy    . Hernia repair  67/34/1937     umbilical hernia  . Shoulder arthroscopy      left shoulder  . Wrist fracture surgery      plate in left wrist/ fractured 2 times    reports that she quit smoking about 33 years ago. Her smoking use included Cigarettes. She quit after 20 years of use. She has never used smokeless tobacco. She reports that she drinks alcohol. She reports that she does not use illicit drugs. family history includes Diabetes in her mother; Heart disease in her mother; Hyperlipidemia in her mother. Allergies  Allergen Reactions  . Codeine     REACTION: Nausea  . Penicillins     REACTION: Rash  . Sulfonamide Derivatives     REACTION: Nausea  . Percocet [Oxycodone-Acetaminophen] Itching   Current Outpatient Prescriptions on File Prior to Visit  Medication Sig Dispense Refill  . Ascorbic Acid (VITAMIN C) 1000 MG tablet Take 1,000 mg by mouth daily.    . calcium carbonate (OS-CAL) 600 MG TABS Take 600 mg by mouth 2 (two) times daily with a meal.    . cetirizine (ZYRTEC) 10 MG tablet Take 10 mg by mouth  daily with breakfast.     . Cholecalciferol (VITAMIN D3) 1000 UNITS CAPS Take 1 capsule by mouth daily.    Marland Kitchen latanoprost (XALATAN) 0.005 % ophthalmic solution Place 1 drop into both eyes at bedtime.     . meloxicam (MOBIC) 15 MG tablet TAKE 1 TABLET EVERY DAY WITH BREAKFAST 90 tablet 1  . Multiple Vitamin (MULTIVITAMIN) tablet Take 1 tablet by mouth daily.    . Omega-3 Fatty Acids (FISH OIL) 1200 MG CAPS Take by mouth. Take 2 a day    . timolol (TIMOPTIC) 0.5 % ophthalmic solution Place 1 drop into both eyes daily.     No current facility-administered medications on file prior to visit.     Review of Systems  Constitutional: Negative for unusual diaphoresis or night sweats HENT: Negative for ringing in ear or discharge Eyes: Negative for double vision or worsening visual disturbance.  Respiratory: Negative for choking and stridor.   Gastrointestinal: Negative for vomiting or other signifcant bowel  change Genitourinary: Negative for hematuria or change in urine volume.  Musculoskeletal: Negative for other MSK pain or swelling Skin: Negative for color change and worsening wound.  Neurological: Negative for tremors and numbness other than noted  Psychiatric/Behavioral: Negative for decreased concentration or agitation other than above       Objective:   Physical Exam BP 118/66 mmHg  Pulse 65  Temp(Src) 97.8 F (36.6 C) (Oral)  Ht 5\' 4"  (1.626 m)  Wt 145 lb (65.772 kg)  BMI 24.88 kg/m2  SpO2 94% VS noted, not ill appearing Constitutional: Pt appears in no significant distress HENT: Head: NCAT.  Right Ear: External ear normal.  Left Ear: External ear normal.  Eyes: . Pupils are equal, round, and reactive to light. Conjunctivae and EOM are normal Neck: Normal range of motion. Neck supple.  Cardiovascular: Normal rate and regular rhythm.   Pulmonary/Chest: Effort normal and breath sounds without rales or wheezing.  bilat hand PIP's diffuse mild tender without effusions , and with FROM Right hand CMC with mild red,tender,swelling Neurological: Pt is alert. Not confused , motor grossly intact o/w sens/dtr intact to UE's Skin: Skin is warm. No rash, no LE edema Psychiatric: Pt behavior is normal. No agitation.     Assessment & Plan:

## 2015-06-05 NOTE — Assessment & Plan Note (Signed)
stable overall by history and exam, recent data reviewed with pt, and pt to continue medical treatment as before,  to f/u any worsening symptoms or concerns  

## 2015-06-05 NOTE — Progress Notes (Signed)
Pre visit review using our clinic review tool, if applicable. No additional management support is needed unless otherwise documented below in the visit note. 

## 2015-06-05 NOTE — Assessment & Plan Note (Addendum)
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

## 2015-06-05 NOTE — Patient Instructions (Signed)
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  Please have the pharmacy call with any other refills you may need.  Please continue your efforts at being more active, low cholesterol diet, and weight control.  You are otherwise up to date with prevention measures today.  Please keep your appointments with your specialists as you may have planned  I think we can hold on more lab tests today  Please return in 6 months, or sooner if needed

## 2015-06-23 DIAGNOSIS — M4120 Other idiopathic scoliosis, site unspecified: Secondary | ICD-10-CM | POA: Diagnosis not present

## 2015-06-23 DIAGNOSIS — M545 Low back pain: Secondary | ICD-10-CM | POA: Diagnosis not present

## 2015-06-23 DIAGNOSIS — M5416 Radiculopathy, lumbar region: Secondary | ICD-10-CM | POA: Diagnosis not present

## 2015-06-23 DIAGNOSIS — M542 Cervicalgia: Secondary | ICD-10-CM | POA: Diagnosis not present

## 2015-07-03 ENCOUNTER — Telehealth: Payer: Self-pay | Admitting: Internal Medicine

## 2015-07-03 DIAGNOSIS — IMO0002 Reserved for concepts with insufficient information to code with codable children: Secondary | ICD-10-CM

## 2015-07-03 DIAGNOSIS — M1710 Unilateral primary osteoarthritis, unspecified knee: Secondary | ICD-10-CM

## 2015-07-03 NOTE — Telephone Encounter (Signed)
Pt husband called in and wants a referral to   Dr. Leigh Aurora     Address: [address] [place], [place] [address]  [phone]    For the Arthritis in her hands

## 2015-07-05 NOTE — Telephone Encounter (Signed)
Referral done

## 2015-07-09 DIAGNOSIS — R269 Unspecified abnormalities of gait and mobility: Secondary | ICD-10-CM | POA: Diagnosis not present

## 2015-07-11 DIAGNOSIS — R269 Unspecified abnormalities of gait and mobility: Secondary | ICD-10-CM | POA: Diagnosis not present

## 2015-07-15 DIAGNOSIS — M7989 Other specified soft tissue disorders: Secondary | ICD-10-CM | POA: Diagnosis not present

## 2015-07-15 DIAGNOSIS — M5136 Other intervertebral disc degeneration, lumbar region: Secondary | ICD-10-CM | POA: Diagnosis not present

## 2015-07-15 DIAGNOSIS — M2550 Pain in unspecified joint: Secondary | ICD-10-CM | POA: Diagnosis not present

## 2015-07-16 DIAGNOSIS — R269 Unspecified abnormalities of gait and mobility: Secondary | ICD-10-CM | POA: Diagnosis not present

## 2015-07-18 DIAGNOSIS — R269 Unspecified abnormalities of gait and mobility: Secondary | ICD-10-CM | POA: Diagnosis not present

## 2015-07-22 DIAGNOSIS — R269 Unspecified abnormalities of gait and mobility: Secondary | ICD-10-CM | POA: Diagnosis not present

## 2015-07-24 DIAGNOSIS — R269 Unspecified abnormalities of gait and mobility: Secondary | ICD-10-CM | POA: Diagnosis not present

## 2015-07-28 DIAGNOSIS — R269 Unspecified abnormalities of gait and mobility: Secondary | ICD-10-CM | POA: Diagnosis not present

## 2015-07-29 DIAGNOSIS — M5136 Other intervertebral disc degeneration, lumbar region: Secondary | ICD-10-CM | POA: Diagnosis not present

## 2015-07-29 DIAGNOSIS — M2550 Pain in unspecified joint: Secondary | ICD-10-CM | POA: Diagnosis not present

## 2015-07-29 DIAGNOSIS — M7989 Other specified soft tissue disorders: Secondary | ICD-10-CM | POA: Diagnosis not present

## 2015-07-29 DIAGNOSIS — M0609 Rheumatoid arthritis without rheumatoid factor, multiple sites: Secondary | ICD-10-CM | POA: Diagnosis not present

## 2015-07-30 DIAGNOSIS — R269 Unspecified abnormalities of gait and mobility: Secondary | ICD-10-CM | POA: Diagnosis not present

## 2015-08-06 DIAGNOSIS — R339 Retention of urine, unspecified: Secondary | ICD-10-CM | POA: Diagnosis not present

## 2015-08-06 DIAGNOSIS — N390 Urinary tract infection, site not specified: Secondary | ICD-10-CM | POA: Diagnosis not present

## 2015-08-19 DIAGNOSIS — R339 Retention of urine, unspecified: Secondary | ICD-10-CM | POA: Diagnosis not present

## 2015-08-19 DIAGNOSIS — N390 Urinary tract infection, site not specified: Secondary | ICD-10-CM | POA: Diagnosis not present

## 2015-08-19 DIAGNOSIS — N3941 Urge incontinence: Secondary | ICD-10-CM | POA: Diagnosis not present

## 2015-08-27 DIAGNOSIS — H5340 Unspecified visual field defects: Secondary | ICD-10-CM | POA: Diagnosis not present

## 2015-08-27 DIAGNOSIS — H4011X3 Primary open-angle glaucoma, severe stage: Secondary | ICD-10-CM | POA: Diagnosis not present

## 2015-08-27 DIAGNOSIS — Z961 Presence of intraocular lens: Secondary | ICD-10-CM | POA: Diagnosis not present

## 2015-08-27 DIAGNOSIS — H4720 Unspecified optic atrophy: Secondary | ICD-10-CM | POA: Diagnosis not present

## 2015-09-02 DIAGNOSIS — R339 Retention of urine, unspecified: Secondary | ICD-10-CM | POA: Diagnosis not present

## 2015-09-02 DIAGNOSIS — N3941 Urge incontinence: Secondary | ICD-10-CM | POA: Diagnosis not present

## 2015-09-04 DIAGNOSIS — M7061 Trochanteric bursitis, right hip: Secondary | ICD-10-CM | POA: Diagnosis not present

## 2015-09-09 DIAGNOSIS — M7989 Other specified soft tissue disorders: Secondary | ICD-10-CM | POA: Diagnosis not present

## 2015-09-09 DIAGNOSIS — M0609 Rheumatoid arthritis without rheumatoid factor, multiple sites: Secondary | ICD-10-CM | POA: Diagnosis not present

## 2015-09-09 DIAGNOSIS — M2550 Pain in unspecified joint: Secondary | ICD-10-CM | POA: Diagnosis not present

## 2015-09-09 DIAGNOSIS — M5136 Other intervertebral disc degeneration, lumbar region: Secondary | ICD-10-CM | POA: Diagnosis not present

## 2015-09-29 DIAGNOSIS — Z23 Encounter for immunization: Secondary | ICD-10-CM | POA: Diagnosis not present

## 2015-11-06 DIAGNOSIS — M0609 Rheumatoid arthritis without rheumatoid factor, multiple sites: Secondary | ICD-10-CM | POA: Diagnosis not present

## 2015-11-06 DIAGNOSIS — M2550 Pain in unspecified joint: Secondary | ICD-10-CM | POA: Diagnosis not present

## 2015-11-06 DIAGNOSIS — M7989 Other specified soft tissue disorders: Secondary | ICD-10-CM | POA: Diagnosis not present

## 2015-11-06 DIAGNOSIS — M5136 Other intervertebral disc degeneration, lumbar region: Secondary | ICD-10-CM | POA: Diagnosis not present

## 2015-11-10 ENCOUNTER — Ambulatory Visit (INDEPENDENT_AMBULATORY_CARE_PROVIDER_SITE_OTHER): Payer: Medicare Other | Admitting: Internal Medicine

## 2015-11-10 ENCOUNTER — Encounter: Payer: Self-pay | Admitting: Internal Medicine

## 2015-11-10 VITALS — BP 126/80 | HR 66 | Temp 98.4°F | Ht 64.0 in | Wt 139.0 lb

## 2015-11-10 DIAGNOSIS — J209 Acute bronchitis, unspecified: Secondary | ICD-10-CM

## 2015-11-10 MED ORDER — PREDNISONE 10 MG PO TABS: ORAL_TABLET | ORAL | Status: DC

## 2015-11-10 MED ORDER — BENZONATATE 100 MG PO CAPS
100.0000 mg | ORAL_CAPSULE | Freq: Three times a day (TID) | ORAL | Status: DC | PRN
Start: 2015-11-10 — End: 2016-05-19

## 2015-11-10 MED ORDER — DOXYCYCLINE HYCLATE 100 MG PO TABS: 100.0000 mg | ORAL_TABLET | Freq: Two times a day (BID) | ORAL | Status: DC

## 2015-11-10 NOTE — Progress Notes (Signed)
   Subjective:    Patient ID: Tara Neal, female    DOB: [birth year], 74 y.o.   MRN: [id number]  HPI Her symptoms began 11/06/15 as tickling in her throat. The next day she began have head congestion which has progressed. This is been associated with maxillary sinus pain and yellow nasal discharge and yellow sputum. She's noted some wheezing. She has some itchy watery eyes as well as sneezing.  Today she blew her nose & had some epistaxis.  Her concerns are that she's had pneumonia twice.  Her husband has upcoming bladder surgery for bladder cancer.  Review of Systems  She denies fever, chills, or sweats. She has no frontal headache, otic pain, or otic discharge. There is no associated dental pain.     Objective:   Physical Exam Nares are markedly boggy and edematous. She has an upper dental plate. She has severe gum erosions of the mandibular teeth. She has minor rhonchi and rales at the bases without increased work of breathing  General appearance:Adequately nourished; no acute distress or increased work of breathing is present.    Lymphatic: No  lymphadenopathy about the head, neck, or axilla .  Eyes: No conjunctival inflammation or lid edema is present. There is no scleral icterus.  Ears:  External ear exam shows no significant lesions or deformities.  Otoscopic examination reveals clear canals, tympanic membranes are intact bilaterally without bulging, retraction, inflammation or discharge.  Nose:  External nasal examination shows no deformity or inflammation.  Oral exam:  lips and gums are healthy appearing.There is no oropharyngeal erythema or exudate .  Neck:  No deformities, thyromegaly, masses, or tenderness noted.      Heart:  Normal rate and regular rhythm. S1 and S2 normal without gallop, murmur, click, rub or other extra sounds.   Extremities:  No cyanosis, edema, or clubbing  noted   Skin: Warm & dry w/o tenting or jaundice. No significant lesions or rash.       Assessment & Plan:  #1 acute bronchitis w/o bronchospasm #2 URI, acute Plan: See orders and recommendations

## 2015-11-10 NOTE — Progress Notes (Signed)
Pre visit review using our clinic review tool, if applicable. No additional management support is needed unless otherwise documented below in the visit note. 

## 2015-11-10 NOTE — Patient Instructions (Addendum)
Plain Mucinex (NOT D) for thick secretions ;force NON dairy fluids .   Nasal cleansing in the shower as discussed with lather of mild shampoo.After 10 seconds wash off lather while  exhaling through nostrils. Make sure that all residual soap is removed to prevent irritation.  Flonase OR Nasacort AQ 1 spray in each nostril twice a day as needed. Use the "crossover" technique into opposite nostril spraying toward opposite ear @ 45 degree angle, not straight up into nostril.  Plain Allegra (NOT D )  160 daily , Loratidine 10 mg , OR Zyrtec 10 mg @ bedtime  as needed for itchy eyes & sneezing.  Carry room temperature water and sip liberally after coughing.   Hold the simvastatin and Fosamax  until you've completed the antibiotic.

## 2015-12-11 DIAGNOSIS — Z0000 Encounter for general adult medical examination without abnormal findings: Secondary | ICD-10-CM | POA: Diagnosis not present

## 2015-12-11 DIAGNOSIS — N3941 Urge incontinence: Secondary | ICD-10-CM | POA: Diagnosis not present

## 2015-12-11 DIAGNOSIS — R338 Other retention of urine: Secondary | ICD-10-CM | POA: Diagnosis not present

## 2015-12-15 ENCOUNTER — Encounter: Payer: Self-pay | Admitting: Cardiovascular Disease

## 2015-12-19 ENCOUNTER — Other Ambulatory Visit: Payer: Self-pay

## 2015-12-19 DIAGNOSIS — Z1231 Encounter for screening mammogram for malignant neoplasm of breast: Secondary | ICD-10-CM

## 2015-12-24 DIAGNOSIS — H401133 Primary open-angle glaucoma, bilateral, severe stage: Secondary | ICD-10-CM | POA: Diagnosis not present

## 2015-12-24 DIAGNOSIS — Z961 Presence of intraocular lens: Secondary | ICD-10-CM | POA: Diagnosis not present

## 2015-12-30 ENCOUNTER — Other Ambulatory Visit: Payer: Self-pay | Admitting: Internal Medicine

## 2016-01-07 DIAGNOSIS — M0609 Rheumatoid arthritis without rheumatoid factor, multiple sites: Secondary | ICD-10-CM | POA: Diagnosis not present

## 2016-01-07 DIAGNOSIS — M2550 Pain in unspecified joint: Secondary | ICD-10-CM | POA: Diagnosis not present

## 2016-01-07 DIAGNOSIS — M5136 Other intervertebral disc degeneration, lumbar region: Secondary | ICD-10-CM | POA: Diagnosis not present

## 2016-01-07 DIAGNOSIS — M7989 Other specified soft tissue disorders: Secondary | ICD-10-CM | POA: Diagnosis not present

## 2016-01-19 DIAGNOSIS — Z0000 Encounter for general adult medical examination without abnormal findings: Secondary | ICD-10-CM | POA: Diagnosis not present

## 2016-01-19 DIAGNOSIS — N3941 Urge incontinence: Secondary | ICD-10-CM | POA: Diagnosis not present

## 2016-01-19 DIAGNOSIS — R338 Other retention of urine: Secondary | ICD-10-CM | POA: Diagnosis not present

## 2016-01-26 ENCOUNTER — Encounter: Payer: Self-pay | Admitting: Podiatry

## 2016-01-26 ENCOUNTER — Ambulatory Visit (INDEPENDENT_AMBULATORY_CARE_PROVIDER_SITE_OTHER): Payer: Medicare Other | Admitting: Podiatry

## 2016-01-26 DIAGNOSIS — B351 Tinea unguium: Secondary | ICD-10-CM | POA: Diagnosis not present

## 2016-01-26 DIAGNOSIS — M79673 Pain in unspecified foot: Secondary | ICD-10-CM | POA: Diagnosis not present

## 2016-01-27 NOTE — Progress Notes (Signed)
Subjective:     Patient ID: Tara Neal, female   DOB: 11-13-1941, 75 y.o.   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

## 2016-01-28 DIAGNOSIS — N3941 Urge incontinence: Secondary | ICD-10-CM | POA: Diagnosis not present

## 2016-02-02 ENCOUNTER — Ambulatory Visit: Payer: Medicare Other

## 2016-02-04 DIAGNOSIS — M0609 Rheumatoid arthritis without rheumatoid factor, multiple sites: Secondary | ICD-10-CM | POA: Diagnosis not present

## 2016-02-10 DIAGNOSIS — R338 Other retention of urine: Secondary | ICD-10-CM | POA: Diagnosis not present

## 2016-02-10 DIAGNOSIS — N131 Hydronephrosis with ureteral stricture, not elsewhere classified: Secondary | ICD-10-CM | POA: Diagnosis not present

## 2016-02-24 ENCOUNTER — Ambulatory Visit (INDEPENDENT_AMBULATORY_CARE_PROVIDER_SITE_OTHER)
Admission: RE | Admit: 2016-02-24 | Discharge: 2016-02-24 | Disposition: A | Discharge status: Home / Self Care | Payer: Medicare Other | Source: Ambulatory Visit | Attending: Internal Medicine | Admitting: Internal Medicine

## 2016-02-24 ENCOUNTER — Encounter: Payer: Self-pay | Admitting: Internal Medicine

## 2016-02-24 ENCOUNTER — Ambulatory Visit (INDEPENDENT_AMBULATORY_CARE_PROVIDER_SITE_OTHER): Payer: Medicare Other | Admitting: Internal Medicine

## 2016-02-24 VITALS — BP 122/74 | HR 69 | Temp 98.8°F | Resp 20 | Wt 137.0 lb

## 2016-02-24 DIAGNOSIS — E119 Type 2 diabetes mellitus without complications: Secondary | ICD-10-CM

## 2016-02-24 DIAGNOSIS — R059 Cough, unspecified: Secondary | ICD-10-CM

## 2016-02-24 DIAGNOSIS — I10 Essential (primary) hypertension: Secondary | ICD-10-CM | POA: Diagnosis not present

## 2016-02-24 DIAGNOSIS — R05 Cough: Secondary | ICD-10-CM

## 2016-02-24 DIAGNOSIS — R509 Fever, unspecified: Secondary | ICD-10-CM | POA: Diagnosis not present

## 2016-02-24 DIAGNOSIS — R269 Unspecified abnormalities of gait and mobility: Secondary | ICD-10-CM

## 2016-02-24 MED ORDER — METHOTREXATE (PF) 10 MG/0.2ML ~~LOC~~ SOAJ: SUBCUTANEOUS | Status: DC

## 2016-02-24 MED ORDER — HYDROCODONE-HOMATROPINE 5-1.5 MG/5ML PO SYRP: 5.0000 mL | ORAL_SOLUTION | Freq: Four times a day (QID) | ORAL | Status: DC | PRN

## 2016-02-24 MED ORDER — LEVOFLOXACIN 250 MG PO TABS: 250.0000 mg | ORAL_TABLET | Freq: Every day | ORAL | Status: DC

## 2016-02-24 NOTE — Progress Notes (Signed)
Pre visit review using our clinic review tool, if applicable. No additional management support is needed unless otherwise documented below in the visit note. 

## 2016-02-24 NOTE — Patient Instructions (Addendum)
Please take all new medication as prescribed - the antibiotic, and cough medicine  Please continue all other medications as before, and refills have been done if requested.  Please have the pharmacy call with any other refills you may need.  Please continue your efforts at being more active, low cholesterol diet, and weight control.  Please keep your appointments with your specialists as you may have planned  You are given the Prescription for the Rollater with seat, to use at any medical supply store, such as [facility] on [address]  Please go to the XRAY Department in the Basement (go straight as you get off the elevator) for the x-ray testing  You will be contacted by phone if any changes need to be made immediately.  Otherwise, you will receive a letter about your results with an explanation, but please check with MyChart first.  Please remember to sign up for MyChart if you have not done so, as this will be important to you in the future with finding out test results, communicating by private email, and scheduling acute appointments online when needed.

## 2016-02-24 NOTE — Progress Notes (Signed)
Subjective:    Patient ID: Tara Neal, female    DOB: December 12, 1940, 75 y.o.   MRN: [id number]  HPI  Here with acute onset mild to mod 2-3 days ST, HA, general weakness and malaise, with prod cough greenish sputum, but Pt denies chest pain, increased sob or doe, wheezing, orthopnea, PND, increased LE swelling, palpitations, dizziness or syncope.  Pt denies new neurological symptoms such as new headache, or facial or extremity weakness or numbness, but unfortuantely with worsening gait, asks for rollater walker with seat.  Now on injectable MTX per rheum weekly.  Pt denies polydipsia, polyuria, Past Medical History  Diagnosis Date  . Hyperlipidemia   . Hypertension   . Mild obesity   . Spontaneous pneumothorax     1968  . GERD (gastroesophageal reflux disease)   . Osteoarthritis   . Low back pain   . Osteoporosis   . Hormone replacement therapy (postmenopausal)   . Anxiety   . Osteopenia   . COPD (chronic obstructive pulmonary disease) ([place])   . Diverticulosis of colon   . Shortness of breath     with exertion   . Anemia     hx of   . Chronic insomnia 04/22/2012  . Scoliosis   . Pneumonia     hx of 5 years ago   . Migraine headache     denies  . Umbilical hernia [id number]  . Glaucoma   . Allergy    Past Surgical History  Procedure Laterality Date  . Tonsillectomy    . Lumbar disc surgery       x 2  . Other surgical history      benign growth removed from right under ear at age 63   . Excision/release bursa hip  04/26/2012    Procedure: EXCISION/RELEASE BURSA HIP;  Surgeon: Gearlean Alf, MD;  Location: [facility];  Service: Orthopedics;  Laterality: Right;  . Hip adductor tenotomy    . Eye surgery      - bil cataract surg and Retina surgery to left eye  . Tonsillectomy    . Hernia repair  [id number]    umbilical hernia  . Shoulder arthroscopy      left shoulder  . Wrist fracture surgery      plate in left wrist/ fractured 2 times    reports that she quit smoking  about 34 years ago. Her smoking use included Cigarettes. She quit after 20 years of use. She has never used smokeless tobacco. She reports that she drinks alcohol. She reports that she does not use illicit drugs. family history includes Diabetes in her mother; Heart disease in her mother; Hyperlipidemia in her mother. Allergies  Allergen Reactions  . Codeine     REACTION: Nausea  . Penicillins     REACTION: Rash  . Sulfonamide Derivatives     REACTION: Nausea  . Percocet [Oxycodone-Acetaminophen] Itching   Current Outpatient Prescriptions on File Prior to Visit  Medication Sig Dispense Refill  . Ascorbic Acid (VITAMIN C) 1000 MG tablet Take 1,000 mg by mouth daily.    Marland Kitchen atenolol (TENORMIN) 50 MG tablet Take 1 tablet (50 mg total) by mouth daily. 90 tablet 3  . benazepril (LOTENSIN) 40 MG tablet Take 1 tablet (40 mg total) by mouth daily. 90 tablet 3  . benzonatate (TESSALON PERLES) 100 MG capsule Take 1 capsule (100 mg total) by mouth 3 (three) times daily as needed for cough. 15 capsule 0  . calcium carbonate (OS-CAL) 600  MG TABS Take 600 mg by mouth 2 (two) times daily with a meal.    . cetirizine (ZYRTEC) 10 MG tablet Take 10 mg by mouth daily with breakfast.     . Cholecalciferol (VITAMIN D3) 1000 UNITS CAPS Take 1 capsule by mouth daily.    . diclofenac sodium (VOLTAREN) 1 % GEL Apply 4 g topically 4 (four) times daily as needed. 100 g 5  . doxycycline (VIBRA-TABS) 100 MG tablet Take 1 tablet (100 mg total) by mouth 2 (two) times daily. 20 tablet 0  . FOLIC ACID PO Take by mouth.    . furosemide (LASIX) 40 MG tablet TAKE 1 TABLET EVERY DAY WITH BREAKFAST 90 tablet 3  . latanoprost (XALATAN) 0.005 % ophthalmic solution Place 1 drop into both eyes at bedtime.     . meloxicam (MOBIC) 15 MG tablet TAKE 1 TABLET EVERY DAY WITH BREAKFAST 90 tablet 1  . Multiple Vitamin (MULTIVITAMIN) tablet Take 1 tablet by mouth daily.    . Omega-3 Fatty Acids (FISH OIL) 1200 MG CAPS Take by mouth. Take 2  a day    . omeprazole (PRILOSEC) 20 MG capsule Take 2 capsules (40 mg total) by mouth daily. 180 capsule 3  . predniSONE (DELTASONE) 10 MG tablet 1 tid pc 21 tablet 0  . simvastatin (ZOCOR) 40 MG tablet Take 1 tablet (40 mg total) by mouth daily at 6 PM. 90 tablet 3  . timolol (TIMOPTIC) 0.5 % ophthalmic solution Place 1 drop into both eyes daily.    Marland Kitchen venlafaxine XR (EFFEXOR-XR) 150 MG 24 hr capsule Take 1 capsule (150 mg total) by mouth daily with breakfast. 90 capsule 3   No current facility-administered medications on file prior to visit.    Review of Systems  Constitutional: Negative for unusual diaphoresis or night sweats HENT: Negative for ear swelling or discharge Eyes: Negative for worsening visual haziness  Respiratory: Negative for choking and stridor.   Gastrointestinal: Negative for distension or worsening eructation Genitourinary: Negative for retention or change in urine volume.  Musculoskeletal: Negative for other MSK pain or swelling Skin: Negative for color change and worsening wound Neurological: Negative for tremors and numbness other than noted  Psychiatric/Behavioral: Negative for decreased concentration or agitation other than above       Objective:   Physical Exam BP 122/74 mmHg  Pulse 69  Temp(Src) 98.8 F (37.1 C) (Oral)  Resp 20  Wt 137 lb (62.143 kg)  SpO2 96% VS noted, mild ill Constitutional: Pt appears in no apparent distress HENT: Head: NCAT.  Right Ear: External ear normal.  Left Ear: External ear normal.  Eyes: . Pupils are equal, round, and reactive to light. Conjunctivae and EOM are normal Neck: Normal range of motion. Neck supple.  Cardiovascular: Normal rate and regular rhythm.   Pulmonary/Chest: Effort normal and breath sounds without rales or wheezing.  Abd:  Soft, NT, ND, + BS Neurological: Pt is alert. Not confused , motor grossly intact but slowed, unsteady gait Skin: Skin is warm. No rash, no LE edema Psychiatric: Pt behavior is  normal. No agitation.     Assessment & Plan:

## 2016-02-27 ENCOUNTER — Other Ambulatory Visit: Payer: Self-pay | Admitting: Internal Medicine

## 2016-02-27 NOTE — Assessment & Plan Note (Signed)
stable overall by history and exam, recent data reviewed with pt, and pt to continue medical treatment as before,  to f/u any worsening symptoms or concerns Lab Results  Component Value Date   HGBA1C 5.8 04/11/2015    

## 2016-02-27 NOTE — Assessment & Plan Note (Addendum)
Mild to mod, c/w bronchitis vs pna, for cxr, for antibx course,  to f/u any worsening symptoms or concerns 

## 2016-02-27 NOTE — Assessment & Plan Note (Signed)
stable overall by history and exam, recent data reviewed with pt, and pt to continue medical treatment as before,  to f/u any worsening symptoms or concerns BP Readings from Last 3 Encounters:  02/24/16 122/74  11/10/15 126/80  06/05/15 118/66

## 2016-02-27 NOTE — Assessment & Plan Note (Signed)
Ok for [employer] with seat, declines PT eval,  to f/u any worsening symptoms or concerns

## 2016-03-04 DIAGNOSIS — M0609 Rheumatoid arthritis without rheumatoid factor, multiple sites: Secondary | ICD-10-CM | POA: Diagnosis not present

## 2016-03-04 DIAGNOSIS — M5136 Other intervertebral disc degeneration, lumbar region: Secondary | ICD-10-CM | POA: Diagnosis not present

## 2016-03-04 DIAGNOSIS — M2550 Pain in unspecified joint: Secondary | ICD-10-CM | POA: Diagnosis not present

## 2016-03-04 DIAGNOSIS — M7989 Other specified soft tissue disorders: Secondary | ICD-10-CM | POA: Diagnosis not present

## 2016-03-13 ENCOUNTER — Other Ambulatory Visit: Payer: Self-pay | Admitting: Internal Medicine

## 2016-03-16 ENCOUNTER — Ambulatory Visit (INDEPENDENT_AMBULATORY_CARE_PROVIDER_SITE_OTHER): Payer: Medicare Other

## 2016-03-16 VITALS — BP 140/80 | HR 53 | Ht 64.0 in | Wt 131.1 lb

## 2016-03-16 DIAGNOSIS — Z0000 Encounter for general adult medical examination without abnormal findings: Secondary | ICD-10-CM | POA: Diagnosis not present

## 2016-03-16 NOTE — Patient Instructions (Addendum)
[name] , Thank you for taking time to come for your Medicare Wellness Visit. I appreciate your ongoing commitment to your health goals. Please review the following plan we discussed and let me know if I can assist you in the future.   May visit caregiver group; (given resources; [facility];  [email or web address] [phone]  Call [place] [employer] [facility]  [email or web address]  Discussed a w/c / would need to talk to the doctor and Physical Therapy would have to evaluate.  Getting hearing check  Has had shingles vaccination; approx 2 to 3 years   These are the goals we discussed: To discuss w/c with spouse and doctor  Goals    None      This is a list of the screening recommended for you and due dates:  Health Maintenance  Topic Date Due  . Eye exam for diabetics  01/05/1951  . Shingles Vaccine  01/04/2002  . Complete foot exam   06/20/2015  . Hemoglobin A1C  10/12/2015  . Pneumonia vaccines (2 of 2 - PPSV23) 06/04/2016  . Flu Shot  06/29/2016  . Mammogram  01/28/2017  . Tetanus Vaccine  02/07/2019  . Colon Cancer Screening  08/28/2019  . DEXA scan (bone density measurement)  Completed     Fall Prevention in the Home  Falls can cause injuries. They can happen to people of all ages. There are many things you can do to make your home safe and to help prevent falls.  WHAT CAN I DO ON THE OUTSIDE OF MY HOME?  Regularly fix the edges of walkways and driveways and fix any cracks.  Remove anything that might make you trip as you walk through a door, such as a raised step or threshold.  Trim any bushes or trees on the path to your home.  Use bright outdoor lighting.  Clear any walking paths of anything that might make someone trip, such as rocks or tools.  Regularly check to see if handrails are loose or broken. Make sure that both sides of any steps have handrails.  Any raised decks and porches should have guardrails on the  edges.  Have any leaves, snow, or ice cleared regularly.  Use sand or salt on walking paths during winter.  Clean up any spills in your garage right away. This includes oil or grease spills. WHAT CAN I DO IN THE BATHROOM?   Use night lights.  Install grab bars by the toilet and in the tub and shower. Do not use towel bars as grab bars.  Use non-skid mats or decals in the tub or shower.  If you need to sit down in the shower, use a plastic, non-slip stool.  Keep the floor dry. Clean up any water that spills on the floor as soon as it happens.  Remove soap buildup in the tub or shower regularly.  Attach bath mats securely with double-sided non-slip rug tape.  Do not have throw rugs and other things on the floor that can make you trip. WHAT CAN I DO IN THE BEDROOM?  Use night lights.  Make sure that you have a light by your bed that is easy to reach.  Do not use any sheets or blankets that are too big for your bed. They should not hang down onto the floor.  Have a firm chair that has side arms. You can use this for support while you get dressed.  Do not have throw rugs and other things on the floor that  can make you trip. WHAT CAN I DO IN THE KITCHEN?  Clean up any spills right away.  Avoid walking on wet floors.  Keep items that you use a lot in easy-to-reach places.  If you need to reach something above you, use a strong step stool that has a grab bar.  Keep electrical cords out of the way.  Do not use floor polish or wax that makes floors slippery. If you must use wax, use non-skid floor wax.  Do not have throw rugs and other things on the floor that can make you trip. WHAT CAN I DO WITH MY STAIRS?  Do not leave any items on the stairs.  Make sure that there are handrails on both sides of the stairs and use them. Fix handrails that are broken or loose. Make sure that handrails are as long as the stairways.  Check any carpeting to make sure that it is firmly  attached to the stairs. Fix any carpet that is loose or worn.  Avoid having throw rugs at the top or bottom of the stairs. If you do have throw rugs, attach them to the floor with carpet tape.  Make sure that you have a light switch at the top of the stairs and the bottom of the stairs. If you do not have them, ask someone to add them for you. WHAT ELSE CAN I DO TO HELP PREVENT FALLS?  Wear shoes that:  Do not have high heels.  Have rubber bottoms.  Are comfortable and fit you well.  Are closed at the toe. Do not wear sandals.  If you use a stepladder:  Make sure that it is fully opened. Do not climb a closed stepladder.  Make sure that both sides of the stepladder are locked into place.  Ask someone to hold it for you, if possible.  Clearly mark and make sure that you can see:  Any grab bars or handrails.  First and last steps.  Where the edge of each step is.  Use tools that help you move around (mobility aids) if they are needed. These include:  Canes.  Walkers.  Scooters.  Crutches.  Turn on the lights when you go into a dark area. Replace any light bulbs as soon as they burn out.  Set up your furniture so you have a clear path. Avoid moving your furniture around.  If any of your floors are uneven, fix them.  If there are any pets around you, be aware of where they are.  Review your medicines with your doctor. Some medicines can make you feel dizzy. This can increase your chance of falling. Ask your doctor what other things that you can do to help prevent falls.   This information is not intended to replace advice given to you by your health care provider. Make sure you discuss any questions you have with your health care provider.   Document Released: 09/11/2009 Document Revised: 04/01/2015 Document Reviewed: 12/20/2014 Elsevier Interactive Patient Education 2016 [place] Maintenance, Female Adopting a healthy lifestyle and getting  preventive care can go a long way to promote health and wellness. Talk with your health care provider about what schedule of regular examinations is right for you. This is a good chance for you to check in with your provider about disease prevention and staying healthy. In between checkups, there are plenty of things you can do on your own. Experts have done a lot of research about which lifestyle changes and  preventive measures are most likely to keep you healthy. Ask your health care provider for more information. WEIGHT AND DIET  Eat a healthy diet  Be sure to include plenty of vegetables, fruits, low-fat dairy products, and lean protein.  Do not eat a lot of foods high in solid fats, added sugars, or salt.  Get regular exercise. This is one of the most important things you can do for your health.  Most adults should exercise for at least 150 minutes each week. The exercise should increase your heart rate and make you sweat (moderate-intensity exercise).  Most adults should also do strengthening exercises at least twice a week. This is in addition to the moderate-intensity exercise.  Maintain a healthy weight  Body mass index (BMI) is a measurement that can be used to identify possible weight problems. It estimates body fat based on height and weight. Your health care provider can help determine your BMI and help you achieve or maintain a healthy weight.  For females 23 years of age and older:   A BMI below 18.5 is considered underweight.  A BMI of 18.5 to 24.9 is normal.  A BMI of 25 to 29.9 is considered overweight.  A BMI of 30 and above is considered obese.  Watch levels of cholesterol and blood lipids  You should start having your blood tested for lipids and cholesterol at 74 years of age, then have this test every 5 years.  You may need to have your cholesterol levels checked more often if:  Your lipid or cholesterol levels are high.  You are older than 74 years of  age.  You are at high risk for heart disease.  CANCER SCREENING   Lung Cancer  Lung cancer screening is recommended for adults 68-28 years old who are at high risk for lung cancer because of a history of smoking.  A yearly low-dose CT scan of the lungs is recommended for people who:  Currently smoke.  Have quit within the past 15 years.  Have at least a 30-pack-year history of smoking. A pack year is smoking an average of one pack of cigarettes a day for 1 year.  Yearly screening should continue until it has been 15 years since you quit.  Yearly screening should stop if you develop a health problem that would prevent you from having lung cancer treatment.  Breast Cancer  Practice breast self-awareness. This means understanding how your breasts normally appear and feel.  It also means doing regular breast self-exams. Let your health care provider know about any changes, no matter how small.  If you are in your 20s or 30s, you should have a clinical breast exam (CBE) by a health care provider every 1-3 years as part of a regular health exam.  If you are 85 or older, have a CBE every year. Also consider having a breast X-ray (mammogram) every year.  If you have a family history of breast cancer, talk to your health care provider about genetic screening.  If you are at high risk for breast cancer, talk to your health care provider about having an MRI and a mammogram every year.  Breast cancer gene (BRCA) assessment is recommended for women who have family members with BRCA-related cancers. BRCA-related cancers include:  Breast.  Ovarian.  Tubal.  Peritoneal cancers.  Results of the assessment will determine the need for genetic counseling and BRCA1 and BRCA2 testing. Cervical Cancer Your health care provider may recommend that you be screened regularly for  cancer of the pelvic organs (ovaries, uterus, and vagina). This screening involves a pelvic examination, including  checking for microscopic changes to the surface of your cervix (Pap test). You may be encouraged to have this screening done every 3 years, beginning at age 71.  For women ages 74-65, health care providers may recommend pelvic exams and Pap testing every 3 years, or they may recommend the Pap and pelvic exam, combined with testing for human papilloma virus (HPV), every 5 years. Some types of HPV increase your risk of cervical cancer. Testing for HPV may also be done on women of any age with unclear Pap test results.  Other health care providers may not recommend any screening for nonpregnant women who are considered low risk for pelvic cancer and who do not have symptoms. Ask your health care provider if a screening pelvic exam is right for you.  If you have had past treatment for cervical cancer or a condition that could lead to cancer, you need Pap tests and screening for cancer for at least 20 years after your treatment. If Pap tests have been discontinued, your risk factors (such as having a new sexual partner) need to be reassessed to determine if screening should resume. Some women have medical problems that increase the chance of getting cervical cancer. In these cases, your health care provider may recommend more frequent screening and Pap tests. Colorectal Cancer  This type of cancer can be detected and often prevented.  Routine colorectal cancer screening usually begins at 74 years of age and continues through 74 years of age.  Your health care provider may recommend screening at an earlier age if you have risk factors for colon cancer.  Your health care provider may also recommend using home test kits to check for hidden blood in the stool.  A small camera at the end of a tube can be used to examine your colon directly (sigmoidoscopy or colonoscopy). This is done to check for the earliest forms of colorectal cancer.  Routine screening usually begins at age 24.  Direct examination of  the colon should be repeated every 5-10 years through 74 years of age. However, you may need to be screened more often if early forms of precancerous polyps or small growths are found. Skin Cancer  Check your skin from head to toe regularly.  Tell your health care provider about any new moles or changes in moles, especially if there is a change in a mole's shape or color.  Also tell your health care provider if you have a mole that is larger than the size of a pencil eraser.  Always use sunscreen. Apply sunscreen liberally and repeatedly throughout the day.  Protect yourself by wearing long sleeves, pants, a wide-brimmed hat, and sunglasses whenever you are outside. HEART DISEASE, DIABETES, AND HIGH BLOOD PRESSURE   High blood pressure causes heart disease and increases the risk of stroke. High blood pressure is more likely to develop in:  People who have blood pressure in the high end of the normal range (130-139/85-89 mm Hg).  People who are overweight or obese.  People who are African American.  If you are 87-79 years of age, have your blood pressure checked every 3-5 years. If you are 56 years of age or older, have your blood pressure checked every year. You should have your blood pressure measured twice--once when you are at a hospital or clinic, and once when you are not at a hospital or clinic. Record the average  of the two measurements. To check your blood pressure when you are not at a hospital or clinic, you can use:  An automated blood pressure machine at a pharmacy.  A home blood pressure monitor.  If you are between 3 years and 55 years old, ask your health care provider if you should take aspirin to prevent strokes.  Have regular diabetes screenings. This involves taking a blood sample to check your fasting blood sugar level.  If you are at a normal weight and have a low risk for diabetes, have this test once every three years after 74 years of age.  If you are  overweight and have a high risk for diabetes, consider being tested at a younger age or more often. PREVENTING INFECTION  Hepatitis B  If you have a higher risk for hepatitis B, you should be screened for this virus. You are considered at high risk for hepatitis B if:  You were born in a country where hepatitis B is common. Ask your health care provider which countries are considered high risk.  Your parents were born in a high-risk country, and you have not been immunized against hepatitis B (hepatitis B vaccine).  You have HIV or AIDS.  You use needles to inject street drugs.  You live with someone who has hepatitis B.  You have had sex with someone who has hepatitis B.  You get hemodialysis treatment.  You take certain medicines for conditions, including cancer, organ transplantation, and autoimmune conditions. Hepatitis C  Blood testing is recommended for:  Everyone born from [birth year] through 1964.  Anyone with known risk factors for hepatitis C. Sexually transmitted infections (STIs)  You should be screened for sexually transmitted infections (STIs) including gonorrhea and chlamydia if:  You are sexually active and are younger than 74 years of age.  You are older than 73 years of age and your health care provider tells you that you are at risk for this type of infection.  Your sexual activity has changed since you were last screened and you are at an increased risk for chlamydia or gonorrhea. Ask your health care provider if you are at risk.  If you do not have HIV, but are at risk, it may be recommended that you take a prescription medicine daily to prevent HIV infection. This is called pre-exposure prophylaxis (PrEP). You are considered at risk if:  You are sexually active and do not regularly use condoms or know the HIV status of your partner(s).  You take drugs by injection.  You are sexually active with a partner who has HIV. Talk with your health care provider  about whether you are at high risk of being infected with HIV. If you choose to begin PrEP, you should first be tested for HIV. You should then be tested every 3 months for as long as you are taking PrEP.  PREGNANCY   If you are premenopausal and you may become pregnant, ask your health care provider about preconception counseling.  If you may become pregnant, take 400 to 800 micrograms (mcg) of folic acid every day.  If you want to prevent pregnancy, talk to your health care provider about birth control (contraception). OSTEOPOROSIS AND MENOPAUSE   Osteoporosis is a disease in which the bones lose minerals and strength with aging. This can result in serious bone fractures. Your risk for osteoporosis can be identified using a bone density scan.  If you are 34 years of age or older, or if you are  at risk for osteoporosis and fractures, ask your health care provider if you should be screened.  Ask your health care provider whether you should take a calcium or vitamin D supplement to lower your risk for osteoporosis.  Menopause may have certain physical symptoms and risks.  Hormone replacement therapy may reduce some of these symptoms and risks. Talk to your health care provider about whether hormone replacement therapy is right for you.  HOME CARE INSTRUCTIONS   Schedule regular health, dental, and eye exams.  Stay current with your immunizations.   Do not use any tobacco products including cigarettes, chewing tobacco, or electronic cigarettes.  If you are pregnant, do not drink alcohol.  If you are breastfeeding, limit how much and how often you drink alcohol.  Limit alcohol intake to no more than 1 drink per day for nonpregnant women. One drink equals 12 ounces of beer, 5 ounces of wine, or 1 ounces of hard liquor.  Do not use street drugs.  Do not share needles.  Ask your health care provider for help if you need support or information about quitting drugs.  Tell your  health care provider if you often feel depressed.  Tell your health care provider if you have ever been abused or do not feel safe at home.   This information is not intended to replace advice given to you by your health care provider. Make sure you discuss any questions you have with your health care provider.   Document Released: 05/31/2011 Document Revised: 12/06/2014 Document Reviewed: 10/17/2013 Elsevier Interactive Patient Education [employer].

## 2016-03-16 NOTE — Progress Notes (Addendum)
Subjective:   Tara Neal is a 75 y.o. female who presents for Medicare Annual (Subsequent) preventive examination.  Review of Systems:   HRA assessment completed during visit; [place]  The Patient was informed that this wellness visit is to identify risk and educate on how to reduce risk for increase disease through lifestyle changes.   ROS deferred to CPE exam with physician  Has OA in hands; knees and legs  Seen Dr. Carles Collet and Dr. [name] for evaluation;  States she needs surgery to her back but is a poor surgical risk  Tried therapy and hands became swollen and stopped it.  Also has Rh arthritis and spouse is giving methotrexate q week;   Medical and family hx Mother hyperlipidemia; HD; DM  Medical issues  COPD doing well currently; medically managed.  HTN/ medically managed   Osteoporosis/ medically managed  Hyperlipidemia; 05/2014; cho 172; trig 429; HDL 39; LDL 47  (a1c 5.8)/ educated on high triglycerides Tobacco former smoker; quit 11/1980; (35years)  ETOH/ rare glass of wine  Medication review/ New meds/ states she manages her meds;    BMI: 22.8; lost some weight (6lbs in a month)  Diet;  Eats packaged meals Goes out to eat a lot Likes cinnamon bites; sausage, egg and cheese; waffle Usually eats 2 meals a day; eats mid- afternoon;  Eats ice cream at hs;  Eats fruit; oranges and clementine's; doesn't eat vegetables;  Takes multi vitamins;  Chicken; ham; fish;   Exercise; was riding a stationary bike but now hurt her knees;  Educated regarding chair exercises  Mentioned [facility]  SAFETY;  Safety reviewed for the home;  Removal of clutter clearing paths through the home,  Railing as needed;  Bathroom safety; has rail attached to commode;   The patient is dependent on spouse. Spent the majority of the time discussing fall risk and ongoing weakness; Spouse monitors 24/7 and is afraid she will fall;  Spouse helps her to the bathroom; washes off;  (takes all of her energy when she gets in the shower) needs assistance in shower;  Dtr works at home; and stays with her some;  (given resources; [facility];  [email or web address] [phone] Recommended going to caregiver group to learn about resources and options; Spouse is getting tired and overwhelmed as patient wants him within eye sight.   Community safety;  Smoke detectors yes Firearms safety / keep in a safe place  Driving accidents and seatbelt/ does not drive Sun protection/ maybe in the summer Stressors 2 but spouse is very high   Depression; no Discussed w/c at home; lift chair possible;   Fall assessment and broke wrist x 2 on left;  Fell off the bed x 2; can't figure out how to get in safely. Fell in bathroom;  Risk for falling on porch; gait is very unsteady; trips over feet;  Discussed a w/c with the patient so the spouse can go outside and does not have to worry about her falling.   Rotator cuff repair on right x 3 years ago   Mobilization and Functional losses notable per spouse in the last 3 months;  now he watches her more closely. Has to have walker close by; It has a seat in it, but if not aligned right, she will still fall when trying to transfer from sitting to walker.   Sleep patterns: sleeps well at hs with dog;  Spouse does not sleep well;   Urinary or fecal incontinence reviewed/ goes  to the bathroom q 3 hours   Counseling: Colonoscopy; 07/2014 repeat in 5 years/ 07/2019 EKG: 03/2012 Mammogram 02/2016 Dexa 11/2012 Lowest t-score -2.7, c/w osteoporosis, see recommendations/  PAP Hearing: hearing has been checked; can't hear high pitched; could need hearing eval  Ophthalmology exam; eyes were checked q 3 to 4 months to follow glaucoma; per Dr. Kathrin Penner Does have glaucoma; on eye qtts;   Immunizations: Zostavax /states she has had the singles vaccination at [employer] approx 2 to 3 years ago   Scientist, physiological;  education provided  Health advice or referrals  Current Care Team reviewed and updated   Cardiac Risk Factors include: advanced age (>51men, >53 women);dyslipidemia;hypertension     Objective:     Vitals: BP 140/80 mmHg  Pulse 53  Ht 5\' 4"  (1.626 m)  Wt 131 lb 2 oz (59.478 kg)  BMI 22.50 kg/m2  SpO2 96%  Body mass index is 22.5 kg/(m^2).   Tobacco History  Smoking status  . Former Smoker -- 20 years  . Types: Cigarettes  . Quit date: 11/29/1981  Smokeless tobacco  . Never Used     Counseling given: Not Answered   Past Medical History  Diagnosis Date  . Hyperlipidemia   . Hypertension   . Mild obesity   . Spontaneous pneumothorax     1968  . GERD (gastroesophageal reflux disease)   . Osteoarthritis   . Low back pain   . Osteoporosis   . Hormone replacement therapy (postmenopausal)   . Anxiety   . Osteopenia   . COPD (chronic obstructive pulmonary disease) ([place])   . Diverticulosis of colon   . Shortness of breath     with exertion   . Anemia     hx of   . Chronic insomnia 04/22/2012  . Scoliosis   . Pneumonia     hx of 5 years ago   . Migraine headache     denies  . Umbilical hernia [id number]  . Glaucoma   . Allergy    Past Surgical History  Procedure Laterality Date  . Tonsillectomy    . Lumbar disc surgery       x 2  . Other surgical history      benign growth removed from right under ear at age 19   . Excision/release bursa hip  04/26/2012    Procedure: EXCISION/RELEASE BURSA HIP;  Surgeon: Gearlean Alf, MD;  Location: [facility];  Service: Orthopedics;  Laterality: Right;  . Hip adductor tenotomy    . Eye surgery      - bil cataract surg and Retina surgery to left eye  . Tonsillectomy    . Hernia repair  [id number]    umbilical hernia  . Shoulder arthroscopy      left shoulder  . Wrist fracture surgery      plate in left wrist/ fractured 2 times   Family History  Problem Relation Age of Onset  . Hyperlipidemia Mother   . Heart  disease Mother   . Diabetes Mother    History  Sexual Activity  . Sexual Activity: Not on file    Outpatient Encounter Prescriptions as of 03/16/2016  Medication Sig  . alendronate (FOSAMAX) 70 MG tablet TAKE 1 TABLET EVERY 7 DAYS (ON SATURDAYS). TAKE WITH A FULL GLASS OF WATER ON AN EMPTY STOMACH  . Ascorbic Acid (VITAMIN C) 1000 MG tablet Take 1,000 mg by mouth daily.  Marland Kitchen atenolol (TENORMIN) 50 MG tablet TAKE 1 TABLET EVERY DAY  .  benazepril (LOTENSIN) 40 MG tablet TAKE 1 TABLET EVERY DAY  . benzonatate (TESSALON PERLES) 100 MG capsule Take 1 capsule (100 mg total) by mouth 3 (three) times daily as needed for cough.  . calcium carbonate (OS-CAL) 600 MG TABS Take 600 mg by mouth 2 (two) times daily with a meal.  . cetirizine (ZYRTEC) 10 MG tablet Take 10 mg by mouth daily with breakfast.   . Cholecalciferol (VITAMIN D3) 1000 UNITS CAPS Take 1 capsule by mouth daily.  . diclofenac sodium (VOLTAREN) 1 % GEL Apply 4 g topically 4 (four) times daily as needed.  Marland Kitchen FOLIC ACID PO Take by mouth.  . furosemide (LASIX) 40 MG tablet TAKE 1 TABLET EVERY DAY WITH BREAKFAST  . HYDROcodone-homatropine (HYCODAN) 5-1.5 MG/5ML syrup Take 5 mLs by mouth every 6 (six) hours as needed for cough.  . latanoprost (XALATAN) 0.005 % ophthalmic solution Place 1 drop into both eyes at bedtime.   . meloxicam (MOBIC) 15 MG tablet TAKE 1 TABLET EVERY DAY WITH BREAKFAST  . Methotrexate, PF, 10 MG/0.2ML SOAJ Per rheumatology  . Multiple Vitamin (MULTIVITAMIN) tablet Take 1 tablet by mouth daily.  . Omega-3 Fatty Acids (FISH OIL) 1200 MG CAPS Take by mouth. Take 2 a day  . omeprazole (PRILOSEC) 20 MG capsule TAKE 2 CAPSULES EVERY DAY  . simvastatin (ZOCOR) 40 MG tablet TAKE 1 TABLET EVERY DAY  AT  6PM  . timolol (TIMOPTIC) 0.5 % ophthalmic solution Place 1 drop into both eyes daily.  Marland Kitchen venlafaxine XR (EFFEXOR-XR) 150 MG 24 hr capsule TAKE 1 CAPSULE EVERY DAY WITH BREAKFAST  . doxycycline (VIBRA-TABS) 100 MG tablet Take  1 tablet (100 mg total) by mouth 2 (two) times daily. (Patient not taking: Reported on 03/16/2016)  . levofloxacin (LEVAQUIN) 250 MG tablet Take 1 tablet (250 mg total) by mouth daily. (Patient not taking: Reported on 03/16/2016)  . predniSONE (DELTASONE) 10 MG tablet 1 tid pc (Patient not taking: Reported on 03/16/2016)   No facility-administered encounter medications on file as of 03/16/2016.    Activities of Daily Living In your present state of health, do you have any difficulty performing the following activities: 03/16/2016  Hearing? N  Vision? N  Difficulty concentrating or making decisions? (No Data)  Walking or climbing stairs? Y  Dressing or bathing? Y  Doing errands, shopping? Y  Preparing Food and eating ? Y  Using the Toilet? Y  In the past six months, have you accidently leaked urine? Y  Do you have problems with loss of bowel control? Y  Managing your Medications? N  Managing your Finances? Y  Housekeeping or managing your Housekeeping? Y    Patient Care Team: Biagio Borg, MD as PCP - General    Assessment:     Exercise Activities and Dietary recommendations Current Exercise Habits: Home exercise routine, Intensity: Moderate  Goals    . Prevent Falls     Discussed w/c and mild exercise ; would have more access by chair; Will consider the lift chair;   Will discuss with Dr. [name] in May           Fall Risk Fall Risk  03/16/2016 04/10/2015 06/19/2014  Falls in the past year? Yes Yes Yes  Number falls in past yr: 2 or more 2 or more 2 or more  Injury with Fall? No Yes -  Risk Factor Category  - - (No Data)  Risk for fall due to : Impaired balance/gait Impaired balance/gait -  Follow up Education  provided - -   Spent 20 minutes with the patient discussing long term plan and possible w/c; family to consider if this would be helpful.  Depression Screen PHQ 2/9 Scores 03/16/2016 04/10/2015 06/19/2014  PHQ - 2 Score 0 - 0  Exception Documentation - Patient  refusal -     Cognitive Testing MMSE - Mini Mental State Exam 03/16/2016  Orientation to time 4  Orientation to Place 5  Registration 3  Attention/ Calculation 2  Recall 2  Language- name 2 objects 2  Language- repeat 1  Language- follow 3 step command 3  Language- read & follow direction 1  Write a sentence 1  Copy design 1  Total score 25   Was given written cues for math; 20 -3 x 2;  Was able to write a sentence;   Immunization History  Administered Date(s) Administered  . Influenza Whole 09/04/2007, 08/29/2008, 09/29/2010  . Influenza, High Dose Seasonal PF 08/21/2014  . Influenza-Unspecified 09/04/2013  . Pneumococcal Conjugate-13 06/05/2015  . Pneumococcal Polysaccharide-23 11/29/2005  . Td 11/30/1995, 02/06/2009   Screening Tests Health Maintenance  Topic Date Due  . OPHTHALMOLOGY EXAM  01/05/1951  . ZOSTAVAX  01/04/2002  . FOOT EXAM  06/20/2015  . HEMOGLOBIN A1C  10/12/2015  . PNA vac Low Risk Adult (2 of 2 - PPSV23) 06/04/2016  . INFLUENZA VACCINE  06/29/2016  . MAMMOGRAM  01/28/2017  . TETANUS/TDAP  02/07/2019  . COLONOSCOPY  08/28/2019  . DEXA SCAN  Completed      Plan:   Will fup with the [employer] for caregiver, community resources and options.  Appears to be losing independence quickly; will discuss w/c with Dr. [name] on her next visit in may.  May need hearing check; could not hear audiometer in the office but answered questions and was engaged in the interview.  Had zostavax at drugstore; [facility] x  2 to 75 yo   During the course of the visit the patient was educated and counseled about the following appropriate screening and preventive services:   Vaccines to include Pneumoccal, Influenza, Hepatitis B, Td, Zostavax, HCV/ up to date  Electrocardiogram 03/2012  Cardiovascular Disease/ neg  Colorectal cancer screening/ 07/2014; repeat in 5 years  Bone density screening/ discussed; under treatment/ discussed fall risk     Diabetes screening/ neg/ 5.8  Glaucoma screening / q 4 months   Mammography/02/2016  Nutrition counseling / discussed weight loss; 6 lbs from last visit; discussed ensure with ice cream; does not need to lose more weight which would put her at more risk;   Patient Instructions (the written plan) was given to the patient.   Wynetta Fines, RN  03/16/2016   Medical screening examination/treatment/procedure(s) were performed by non-physician practitioner and as supervising physician I was immediately available for consultation/collaboration. I agree with above. [name], MD

## 2016-03-18 ENCOUNTER — Telehealth: Payer: Self-pay

## 2016-03-18 NOTE — Telephone Encounter (Signed)
Call to Dr. Kathrin Penner at [phone] to check on eye exam; confirmed eye exam scheduled this year on May 25th at 1:45

## 2016-03-23 ENCOUNTER — Ambulatory Visit: Payer: Medicare Other | Admitting: Internal Medicine

## 2016-03-25 ENCOUNTER — Ambulatory Visit
Admission: RE | Admit: 2016-03-25 | Discharge: 2016-03-25 | Disposition: A | Discharge status: Home / Self Care | Payer: Medicare Other | Source: Ambulatory Visit

## 2016-03-25 ENCOUNTER — Ambulatory Visit: Payer: Medicare Other

## 2016-03-25 DIAGNOSIS — Z1231 Encounter for screening mammogram for malignant neoplasm of breast: Secondary | ICD-10-CM

## 2016-03-29 ENCOUNTER — Ambulatory Visit (INDEPENDENT_AMBULATORY_CARE_PROVIDER_SITE_OTHER): Payer: Medicare Other | Admitting: Podiatry

## 2016-03-29 DIAGNOSIS — M79673 Pain in unspecified foot: Secondary | ICD-10-CM | POA: Diagnosis not present

## 2016-03-29 DIAGNOSIS — B351 Tinea unguium: Secondary | ICD-10-CM | POA: Diagnosis not present

## 2016-03-31 ENCOUNTER — Other Ambulatory Visit (INDEPENDENT_AMBULATORY_CARE_PROVIDER_SITE_OTHER): Payer: Medicare Other

## 2016-03-31 ENCOUNTER — Ambulatory Visit (INDEPENDENT_AMBULATORY_CARE_PROVIDER_SITE_OTHER): Payer: Medicare Other | Admitting: Internal Medicine

## 2016-03-31 ENCOUNTER — Encounter: Payer: Self-pay | Admitting: Internal Medicine

## 2016-03-31 VITALS — BP 136/74 | HR 62 | Temp 98.0°F | Resp 20 | Wt 135.0 lb

## 2016-03-31 DIAGNOSIS — E119 Type 2 diabetes mellitus without complications: Secondary | ICD-10-CM

## 2016-03-31 DIAGNOSIS — M1711 Unilateral primary osteoarthritis, right knee: Secondary | ICD-10-CM | POA: Diagnosis not present

## 2016-03-31 DIAGNOSIS — E785 Hyperlipidemia, unspecified: Secondary | ICD-10-CM

## 2016-03-31 DIAGNOSIS — I10 Essential (primary) hypertension: Secondary | ICD-10-CM

## 2016-03-31 LAB — CBC WITH DIFFERENTIAL/PLATELET
Basophils Absolute: 0 K/uL (ref 0.0–0.1)
Basophils Relative: 0.4 % (ref 0.0–3.0)
Eosinophils Absolute: 0.2 K/uL (ref 0.0–0.7)
Eosinophils Relative: 2.4 % (ref 0.0–5.0)
HCT: 38.3 % (ref 36.0–46.0)
Hemoglobin: 12.7 g/dL (ref 12.0–15.0)
Lymphocytes Relative: 32.1 % (ref 12.0–46.0)
Lymphs Abs: 2.5 K/uL (ref 0.7–4.0)
MCHC: 33.1 g/dL (ref 30.0–36.0)
MCV: 97.4 fl (ref 78.0–100.0)
Monocytes Absolute: 0.6 K/uL (ref 0.1–1.0)
Monocytes Relative: 7.9 % (ref 3.0–12.0)
Neutro Abs: 4.5 K/uL (ref 1.4–7.7)
Neutrophils Relative %: 57.2 % (ref 43.0–77.0)
Platelets: 246 K/uL (ref 150.0–400.0)
RBC: 3.93 Mil/uL (ref 3.87–5.11)
RDW: 14 % (ref 11.5–15.5)
WBC: 7.9 K/uL (ref 4.0–10.5)

## 2016-03-31 LAB — LIPID PANEL
CHOLESTEROL: 128 mg/dL (ref 0–200)
HDL: 35.1 mg/dL — ABNORMAL LOW (ref >39.00)
NonHDL: 93.14
Total CHOL/HDL Ratio: 4
Triglycerides: 204 mg/dL — ABNORMAL HIGH (ref 0.0–149.0)
VLDL: 40.8 mg/dL — ABNORMAL HIGH (ref 0.0–40.0)

## 2016-03-31 LAB — BASIC METABOLIC PANEL WITH GFR
BUN: 12 mg/dL (ref 6–23)
CO2: 28 meq/L (ref 19–32)
Calcium: 9.5 mg/dL (ref 8.4–10.5)
Chloride: 105 meq/L (ref 96–112)
Creatinine, Ser: 0.75 mg/dL (ref 0.40–1.20)
GFR: 80.23 mL/min
Glucose, Bld: 102 mg/dL — ABNORMAL HIGH (ref 70–99)
Potassium: 3.8 meq/L (ref 3.5–5.1)
Sodium: 140 meq/L (ref 135–145)

## 2016-03-31 LAB — HEPATIC FUNCTION PANEL
ALT: 7 U/L (ref 0–35)
AST: 19 U/L (ref 0–37)
Albumin: 4.1 g/dL (ref 3.5–5.2)
Alkaline Phosphatase: 69 U/L (ref 39–117)
Bilirubin, Direct: 0.1 mg/dL (ref 0.0–0.3)
Total Bilirubin: 0.5 mg/dL (ref 0.2–1.2)
Total Protein: 6.8 g/dL (ref 6.0–8.3)

## 2016-03-31 LAB — TSH: TSH: 1.34 u[IU]/mL (ref 0.35–4.50)

## 2016-03-31 LAB — LDL CHOLESTEROL, DIRECT: LDL DIRECT: 63 mg/dL

## 2016-03-31 MED ORDER — MELOXICAM 7.5 MG PO TABS: 7.5000 mg | ORAL_TABLET | Freq: Every day | ORAL | Status: DC

## 2016-03-31 NOTE — Assessment & Plan Note (Signed)
stable overall by history and exam, recent data reviewed with pt, and pt to continue medical treatment as before,  to f/u any worsening symptoms or concerns Lab Results  Component Value Date   HGBA1C 5.8 04/11/2015   For f/u lab

## 2016-03-31 NOTE — Progress Notes (Signed)
Subjective:    Patient ID: Tara Neal, female    DOB: 1941-07-17, 75 y.o.   MRN: [id number]  HPI  Here for yearly f/u; has seen Manuela Schwartz for wellness last month; Overall doing ok;  Pt denies Chest pain, worsening SOB, DOE, wheezing, orthopnea, PND, worsening LE edema, palpitations, dizziness or syncope.  Pt denies neurological change such as new headache, facial or extremity weakness.  Pt denies polydipsia, polyuria, or low sugar symptoms. Pt states overall good compliance with treatment and medications, good tolerability, and has been trying to follow appropriate diet.  Pt denies worsening depressive symptoms, suicidal ideation or panic. No fever, night sweats, wt loss, loss of appetite, or other constitutional symptoms.  Pt states good ability with ADL's, but has been slowed by right knee pain not well defined as of yet, walks with walker due to at least mod fall risk, home safety reviewed and adequate, no other significant changes in hearing or vision.  Asks for mobic refill as ran out last yr, and helped with her knee pain  No falls although has quite a bit of pain to stepping down the 4 inch high elevation from the house to the back porch Past Medical History  Diagnosis Date  . Hyperlipidemia   . Hypertension   . Mild obesity   . Spontaneous pneumothorax     1968  . GERD (gastroesophageal reflux disease)   . Osteoarthritis   . Low back pain   . Osteoporosis   . Hormone replacement therapy (postmenopausal)   . Anxiety   . Osteopenia   . COPD (chronic obstructive pulmonary disease) ([place])   . Diverticulosis of colon   . Shortness of breath     with exertion   . Anemia     hx of   . Chronic insomnia 04/22/2012  . Scoliosis   . Pneumonia     hx of 5 years ago   . Migraine headache     denies  . Umbilical hernia [id number]  . Glaucoma   . Allergy    Past Surgical History  Procedure Laterality Date  . Tonsillectomy    . Lumbar disc surgery       x 2  . Other surgical history     benign growth removed from right under ear at age 65   . Excision/release bursa hip  04/26/2012    Procedure: EXCISION/RELEASE BURSA HIP;  Surgeon: Gearlean Alf, MD;  Location: [facility];  Service: Orthopedics;  Laterality: Right;  . Hip adductor tenotomy    . Eye surgery      - bil cataract surg and Retina surgery to left eye  . Tonsillectomy    . Hernia repair  [id number]    umbilical hernia  . Shoulder arthroscopy      left shoulder  . Wrist fracture surgery      plate in left wrist/ fractured 2 times    reports that she quit smoking about 34 years ago. Her smoking use included Cigarettes. She quit after 20 years of use. She has never used smokeless tobacco. She reports that she drinks alcohol. She reports that she does not use illicit drugs. family history includes Diabetes in her mother; Heart disease in her mother; Hyperlipidemia in her mother. Allergies  Allergen Reactions  . Codeine     REACTION: Nausea  . Penicillins     REACTION: Rash  . Sulfonamide Derivatives     REACTION: Nausea  . Percocet [Oxycodone-Acetaminophen] Itching   Current Outpatient  Prescriptions on File Prior to Visit  Medication Sig Dispense Refill  . alendronate (FOSAMAX) 70 MG tablet TAKE 1 TABLET EVERY 7 DAYS (ON SATURDAYS). TAKE WITH A FULL GLASS OF WATER ON AN EMPTY STOMACH 12 tablet 3  . Ascorbic Acid (VITAMIN C) 1000 MG tablet Take 1,000 mg by mouth daily.    Marland Kitchen atenolol (TENORMIN) 50 MG tablet TAKE 1 TABLET EVERY DAY 90 tablet 3  . benazepril (LOTENSIN) 40 MG tablet TAKE 1 TABLET EVERY DAY 90 tablet 3  . benzonatate (TESSALON PERLES) 100 MG capsule Take 1 capsule (100 mg total) by mouth 3 (three) times daily as needed for cough. 15 capsule 0  . calcium carbonate (OS-CAL) 600 MG TABS Take 600 mg by mouth 2 (two) times daily with a meal.    . cetirizine (ZYRTEC) 10 MG tablet Take 10 mg by mouth daily with breakfast.     . Cholecalciferol (VITAMIN D3) 1000 UNITS CAPS Take 1 capsule by mouth daily.      . diclofenac sodium (VOLTAREN) 1 % GEL Apply 4 g topically 4 (four) times daily as needed. 100 g 5  . doxycycline (VIBRA-TABS) 100 MG tablet Take 1 tablet (100 mg total) by mouth 2 (two) times daily. 20 tablet 0  . FOLIC ACID PO Take by mouth.    . furosemide (LASIX) 40 MG tablet TAKE 1 TABLET EVERY DAY WITH BREAKFAST 90 tablet 3  . HYDROcodone-homatropine (HYCODAN) 5-1.5 MG/5ML syrup Take 5 mLs by mouth every 6 (six) hours as needed for cough. 180 mL 0  . latanoprost (XALATAN) 0.005 % ophthalmic solution Place 1 drop into both eyes at bedtime.     Marland Kitchen levofloxacin (LEVAQUIN) 250 MG tablet Take 1 tablet (250 mg total) by mouth daily. 10 tablet 0  . meloxicam (MOBIC) 15 MG tablet TAKE 1 TABLET EVERY DAY WITH BREAKFAST 90 tablet 1  . Methotrexate, PF, 10 MG/0.2ML SOAJ Per rheumatology 0.2 mL 0  . Multiple Vitamin (MULTIVITAMIN) tablet Take 1 tablet by mouth daily.    . Omega-3 Fatty Acids (FISH OIL) 1200 MG CAPS Take by mouth. Take 2 a day    . omeprazole (PRILOSEC) 20 MG capsule TAKE 2 CAPSULES EVERY DAY 180 capsule 3  . predniSONE (DELTASONE) 10 MG tablet 1 tid pc 21 tablet 0  . simvastatin (ZOCOR) 40 MG tablet TAKE 1 TABLET EVERY DAY  AT  6PM 90 tablet 3  . timolol (TIMOPTIC) 0.5 % ophthalmic solution Place 1 drop into both eyes daily.    Marland Kitchen venlafaxine XR (EFFEXOR-XR) 150 MG 24 hr capsule TAKE 1 CAPSULE EVERY DAY WITH BREAKFAST 90 capsule 3   No current facility-administered medications on file prior to visit.   Review of Systems  Constitutional: Negative for increased diaphoresis, or other activity, appetite or siginficant weight change other than noted HENT: Negative for worsening hearing loss, ear pain, facial swelling, mouth sores and neck stiffness.   Eyes: Negative for other worsening pain, redness or visual disturbance.  Respiratory: Negative for choking or stridor Cardiovascular: Negative for other chest pain and palpitations.  Gastrointestinal: Negative for worsening diarrhea,  blood in stool, or abdominal distention Genitourinary: Negative for hematuria, flank pain or change in urine volume.  Musculoskeletal: Negative for myalgias or other joint complaints.  Skin: Negative for other color change and wound or drainage.  Neurological: Negative for syncope and numbness. other than noted Hematological: Negative for adenopathy. or other swelling Psychiatric/Behavioral: Negative for hallucinations, SI, self-injury, decreased concentration or other worsening agitation.  Objective:   Physical Exam BP 136/74 mmHg  Pulse 62  Temp(Src) 98 F (36.7 C) (Oral)  Resp 20  Wt 135 lb (61.236 kg)  SpO2 96% VS noted, frail, has lost wt, walkers with rollater walker with seat for safety Constitutional: Pt is oriented to person, place, and time. Appears well-developed still, thoough and in no significant distress Head: Normocephalic and atraumatic  Eyes: Conjunctivae and EOM are normal. Pupils are equal, round, and reactive to light Right Ear: External ear normal.  Left Ear: External ear normal Nose: Nose normal.  Mouth/Throat: Oropharynx is clear and moist  Neck: Normal range of motion. Neck supple. No JVD present. No tracheal deviation present or significant neck LA or mass Cardiovascular: Normal rate, regular rhythm, normal heart sounds and intact distal pulses.   Pulmonary/Chest: Effort normal and breath sounds without rales or wheezing  Abdominal: Soft. Bowel sounds are normal. NT. No HSM  Musculoskeletal: Normal range of motion. Exhibits no edema Lymphadenopathy: Has no cervical adenopathy.  Neurological: Pt is alert and oriented to person, place, and time. Pt has normal reflexes. No cranial nerve deficit. Motor grossly intact Right knee with crepitus, bony arthritic changes, ? Trace effusion, decreased ROM Skin: Skin is warm and dry. No rash noted or new ulcers Psychiatric:  Has normal mood and affect. Behavior is normal.     Assessment & Plan:

## 2016-03-31 NOTE — Progress Notes (Signed)
Pre visit review using our clinic review tool, if applicable. No additional management support is needed unless otherwise documented below in the visit note. 

## 2016-03-31 NOTE — Assessment & Plan Note (Signed)
stable overall by history and exam, recent data reviewed with pt, and pt to continue medical treatment as before,  to f/u any worsening symptoms or concerns Lab Results  Component Value Date   LDLCALC 47 06/06/2014

## 2016-03-31 NOTE — Assessment & Plan Note (Signed)
Ok for mobic 7.5 qd prn, declines PT, no recent falls but higher risk even with her walker, pt to make appt with Dr Tamala Julian, sports medicine

## 2016-03-31 NOTE — Patient Instructions (Signed)
Please take all new medication as prescribed - the mobic 7.5 mg per day as needed   Please continue all other medications as before, and refills have been done if requested.  Please have the pharmacy call with any other refills you may need.  Please continue your efforts at being more active, low cholesterol diet, and weight control.  You are otherwise up to date with prevention measures today.  Please keep your appointments with your specialists as you may have planned  Please go to the LAB in the Basement (turn left off the elevator) for the tests to be done today  You will be contacted by phone if any changes need to be made immediately.  Otherwise, you will receive a letter about your results with an explanation, but please check with MyChart first.  Please remember to sign up for MyChart if you have not done so, as this will be important to you in the future with finding out test results, communicating by private email, and scheduling acute appointments online when needed.  Please return in 6 months, or sooner if needed

## 2016-03-31 NOTE — Progress Notes (Signed)
Subjective:     Patient ID: Tara Neal, female   DOB: 1941-02-02, 75 y.o.   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

## 2016-03-31 NOTE — Assessment & Plan Note (Signed)
stable overall by history and exam, recent data reviewed with pt, and pt to continue medical treatment as before,  to f/u any worsening symptoms or concerns BP Readings from Last 3 Encounters:  03/31/16 136/74  03/16/16 140/80  02/24/16 122/74

## 2016-04-01 ENCOUNTER — Other Ambulatory Visit: Payer: Medicare Other

## 2016-04-01 LAB — URINALYSIS, ROUTINE W REFLEX MICROSCOPIC
Bilirubin Urine: NEGATIVE
HGB URINE DIPSTICK: NEGATIVE
KETONES UR: NEGATIVE
Leukocytes, UA: NEGATIVE
Nitrite: NEGATIVE
RBC / HPF: NONE SEEN (ref 0–?)
SPECIFIC GRAVITY, URINE: 1.025 (ref 1.000–1.030)
Total Protein, Urine: NEGATIVE
Urine Glucose: NEGATIVE
Urobilinogen, UA: 0.2 (ref 0.0–1.0)
pH: 6 (ref 5.0–8.0)

## 2016-04-01 LAB — MICROALBUMIN / CREATININE URINE RATIO
CREATININE, U: 133 mg/dL
MICROALB UR: 1.3 mg/dL (ref 0.0–1.9)
Microalb Creat Ratio: 1 mg/g (ref 0.0–30.0)

## 2016-04-01 LAB — HEMOGLOBIN A1C: Hgb A1c MFr Bld: 5.7 % (ref 4.6–6.5)

## 2016-04-19 ENCOUNTER — Ambulatory Visit: Payer: Medicare Other | Admitting: Family Medicine

## 2016-04-22 DIAGNOSIS — H401133 Primary open-angle glaucoma, bilateral, severe stage: Secondary | ICD-10-CM | POA: Diagnosis not present

## 2016-04-22 DIAGNOSIS — Z961 Presence of intraocular lens: Secondary | ICD-10-CM | POA: Diagnosis not present

## 2016-05-04 ENCOUNTER — Emergency Department (HOSPITAL_COMMUNITY)
Admission: EM | Admit: 2016-05-04 | Discharge: 2016-05-04 | Disposition: A | Discharge status: Home / Self Care | Payer: Medicare Other | Attending: Emergency Medicine | Admitting: Emergency Medicine

## 2016-05-04 ENCOUNTER — Emergency Department (HOSPITAL_COMMUNITY): Payer: Medicare Other

## 2016-05-04 ENCOUNTER — Encounter (HOSPITAL_COMMUNITY): Payer: Self-pay | Admitting: Emergency Medicine

## 2016-05-04 DIAGNOSIS — I10 Essential (primary) hypertension: Secondary | ICD-10-CM | POA: Insufficient documentation

## 2016-05-04 DIAGNOSIS — J449 Chronic obstructive pulmonary disease, unspecified: Secondary | ICD-10-CM | POA: Diagnosis not present

## 2016-05-04 DIAGNOSIS — W19XXXA Unspecified fall, initial encounter: Secondary | ICD-10-CM | POA: Diagnosis not present

## 2016-05-04 DIAGNOSIS — M810 Age-related osteoporosis without current pathological fracture: Secondary | ICD-10-CM | POA: Diagnosis not present

## 2016-05-04 DIAGNOSIS — Y999 Unspecified external cause status: Secondary | ICD-10-CM | POA: Diagnosis not present

## 2016-05-04 DIAGNOSIS — Z79899 Other long term (current) drug therapy: Secondary | ICD-10-CM | POA: Insufficient documentation

## 2016-05-04 DIAGNOSIS — S299XXA Unspecified injury of thorax, initial encounter: Secondary | ICD-10-CM | POA: Diagnosis not present

## 2016-05-04 DIAGNOSIS — S79912A Unspecified injury of left hip, initial encounter: Secondary | ICD-10-CM | POA: Diagnosis not present

## 2016-05-04 DIAGNOSIS — Z87891 Personal history of nicotine dependence: Secondary | ICD-10-CM | POA: Diagnosis not present

## 2016-05-04 DIAGNOSIS — Y939 Activity, unspecified: Secondary | ICD-10-CM | POA: Diagnosis not present

## 2016-05-04 DIAGNOSIS — Y92009 Unspecified place in unspecified non-institutional (private) residence as the place of occurrence of the external cause: Secondary | ICD-10-CM | POA: Insufficient documentation

## 2016-05-04 DIAGNOSIS — M1990 Unspecified osteoarthritis, unspecified site: Secondary | ICD-10-CM | POA: Insufficient documentation

## 2016-05-04 DIAGNOSIS — M25552 Pain in left hip: Secondary | ICD-10-CM | POA: Insufficient documentation

## 2016-05-04 LAB — URINE MICROSCOPIC-ADD ON

## 2016-05-04 LAB — URINALYSIS, ROUTINE W REFLEX MICROSCOPIC
BILIRUBIN URINE: NEGATIVE
Glucose, UA: NEGATIVE mg/dL
HGB URINE DIPSTICK: NEGATIVE
KETONES UR: NEGATIVE mg/dL
Nitrite: NEGATIVE
Protein, ur: NEGATIVE mg/dL
SPECIFIC GRAVITY, URINE: 1.015 (ref 1.005–1.030)
pH: 6.5 (ref 5.0–8.0)

## 2016-05-04 LAB — CBC WITH DIFFERENTIAL/PLATELET
BASOS ABS: 0 10*3/uL (ref 0.0–0.1)
Basophils Relative: 0 %
EOS ABS: 0.1 10*3/uL (ref 0.0–0.7)
EOS PCT: 2 %
HCT: 34.1 % — ABNORMAL LOW (ref 36.0–46.0)
HEMOGLOBIN: 11.6 g/dL — AB (ref 12.0–15.0)
LYMPHS ABS: 1.8 10*3/uL (ref 0.7–4.0)
Lymphocytes Relative: 27 %
MCH: 33.5 pg (ref 26.0–34.0)
MCHC: 34 g/dL (ref 30.0–36.0)
MCV: 98.6 fL (ref 78.0–100.0)
Monocytes Absolute: 0.8 10*3/uL (ref 0.1–1.0)
Monocytes Relative: 12 %
NEUTROS PCT: 59 %
Neutro Abs: 3.8 10*3/uL (ref 1.7–7.7)
PLATELETS: 238 10*3/uL (ref 150–400)
RBC: 3.46 MIL/uL — AB (ref 3.87–5.11)
RDW: 13.9 % (ref 11.5–15.5)
WBC: 6.5 10*3/uL (ref 4.0–10.5)

## 2016-05-04 LAB — BASIC METABOLIC PANEL
ANION GAP: 5 (ref 5–15)
BUN: 11 mg/dL (ref 6–20)
CHLORIDE: 108 mmol/L (ref 101–111)
CO2: 26 mmol/L (ref 22–32)
Calcium: 9 mg/dL (ref 8.9–10.3)
Creatinine, Ser: 0.63 mg/dL (ref 0.44–1.00)
Glucose, Bld: 93 mg/dL (ref 65–99)
POTASSIUM: 3.9 mmol/L (ref 3.5–5.1)
SODIUM: 139 mmol/L (ref 135–145)

## 2016-05-04 LAB — I-STAT TROPONIN, ED: TROPONIN I, POC: 0 ng/mL (ref 0.00–0.08)

## 2016-05-04 MED ORDER — ACETAMINOPHEN 325 MG PO TABS
650.0000 mg | ORAL_TABLET | Freq: Once | ORAL | Status: AC
  Administered 2016-05-04: 650 mg via ORAL
  Filled 2016-05-04: qty 2

## 2016-05-04 NOTE — ED Provider Notes (Signed)
CSN: [id number]     Arrival date & time 05/04/16  1129 History   First MD Initiated Contact with Patient 05/04/16 1157     Chief Complaint  Patient presents with  . Fall     (Consider location/radiation/quality/duration/timing/severity/associated sxs/prior Treatment) HPI  Blood pressure 152/80, pulse 60, temperature 97.5 F (36.4 C), temperature source Oral, resp. rate 16, SpO2 100 %.  Tara Neal is a 75 y.o. female  medical history significant for osteoarthritis, COPD, osteopenia complaining of fall/syncope last night while she was in the shower. Patient states that she was reaching out to get her underwear she had a severe pain in her left hip and she states that she fell, she does not think she hit her head or passed out. There was no prodrome of nausea, chest pain, shortness of breath, lightheadedness. States that she has a moderate 4 out of 10 pain in her left hip, she's not and ambulatory at her baseline secondary to deconditioning and rheumatoid arthritis. Can bear weight to transfer.  Patient denies numbness, weakness, cervicalgia. No exacerbating or alleviating symptoms identified, patient is not taking any pain medication prior to arrival.  Past Medical History  Diagnosis Date  . Hyperlipidemia   . Hypertension   . Mild obesity   . Spontaneous pneumothorax     1968  . GERD (gastroesophageal reflux disease)   . Osteoarthritis   . Low back pain   . Osteoporosis   . Hormone replacement therapy (postmenopausal)   . Anxiety   . Osteopenia   . COPD (chronic obstructive pulmonary disease) ([place])   . Diverticulosis of colon   . Shortness of breath     with exertion   . Anemia     hx of   . Chronic insomnia 04/22/2012  . Scoliosis   . Pneumonia     hx of 5 years ago   . Migraine headache     denies  . Umbilical hernia [id number]  . Glaucoma   . Allergy    Past Surgical History  Procedure Laterality Date  . Tonsillectomy    . Lumbar disc surgery       x 2  . Other  surgical history      benign growth removed from right under ear at age 31   . Excision/release bursa hip  04/26/2012    Procedure: EXCISION/RELEASE BURSA HIP;  Surgeon: Gearlean Alf, MD;  Location: [facility];  Service: Orthopedics;  Laterality: Right;  . Hip adductor tenotomy    . Eye surgery      - bil cataract surg and Retina surgery to left eye  . Tonsillectomy    . Hernia repair  [id number]    umbilical hernia  . Shoulder arthroscopy      left shoulder  . Wrist fracture surgery      plate in left wrist/ fractured 2 times   Family History  Problem Relation Age of Onset  . Hyperlipidemia Mother   . Heart disease Mother   . Diabetes Mother    Social History  Substance Use Topics  . Smoking status: Former Smoker -- 20 years    Types: Cigarettes    Quit date: 11/29/1981  . Smokeless tobacco: Never Used  . Alcohol Use: Yes     Comment: one glass of wine every 3 months   OB History    No data available     Review of Systems  10 systems reviewed and found to be negative, except as noted in  the HPI.  Allergies  Codeine; Sulfonamide derivatives; Penicillins; and Percocet  Home Medications   Prior to Admission medications   Medication Sig Start Date End Date Taking? Authorizing Provider  alendronate (FOSAMAX) 70 MG tablet TAKE 1 TABLET EVERY 7 DAYS (ON SATURDAYS). TAKE WITH A FULL GLASS OF WATER ON AN EMPTY STOMACH 02/27/16  Yes Biagio Borg, MD  Ascorbic Acid (VITAMIN C) 1000 MG tablet Take 1,000 mg by mouth daily.   Yes Historical Provider, MD  atenolol (TENORMIN) 50 MG tablet TAKE 1 TABLET EVERY DAY 03/15/16  Yes Biagio Borg, MD  benazepril (LOTENSIN) 40 MG tablet TAKE 1 TABLET EVERY DAY 03/15/16  Yes Biagio Borg, MD  calcium carbonate (OS-CAL) 600 MG TABS Take 600 mg by mouth 2 (two) times daily with a meal.   Yes Historical Provider, MD  cetirizine (ZYRTEC) 10 MG tablet Take 10 mg by mouth daily with breakfast.    Yes Historical Provider, MD  Cholecalciferol (VITAMIN  D3) 1000 UNITS CAPS Take 1 capsule by mouth daily.   Yes Historical Provider, MD  FOLIC ACID PO Take 1 tablet by mouth daily.    Yes Historical Provider, MD  latanoprost (XALATAN) 0.005 % ophthalmic solution Place 1 drop into both eyes at bedtime.  10/09/13  Yes Historical Provider, MD  meloxicam (MOBIC) 7.5 MG tablet Take 1 tablet (7.5 mg total) by mouth daily. 03/31/16  Yes Biagio Borg, MD  methotrexate 250 MG/10ML injection Inject 25 mg into the vein once a week. 1 ml   Yes Historical Provider, MD  Multiple Vitamin (MULTIVITAMIN) tablet Take 1 tablet by mouth daily.   Yes Historical Provider, MD  Omega-3 Fatty Acids (FISH OIL) 1200 MG CAPS Take by mouth. Take 2 a day   Yes Historical Provider, MD  omeprazole (PRILOSEC) 20 MG capsule TAKE 2 CAPSULES EVERY DAY 03/15/16  Yes Biagio Borg, MD  simvastatin (ZOCOR) 40 MG tablet TAKE 1 TABLET EVERY DAY  AT  [facility] 03/15/16  Yes Biagio Borg, MD  timolol (TIMOPTIC) 0.5 % ophthalmic solution Place 1 drop into both eyes daily.   Yes Historical Provider, MD  venlafaxine XR (EFFEXOR-XR) 150 MG 24 hr capsule TAKE 1 CAPSULE EVERY DAY WITH BREAKFAST 03/15/16  Yes Biagio Borg, MD  benzonatate (TESSALON PERLES) 100 MG capsule Take 1 capsule (100 mg total) by mouth 3 (three) times daily as needed for cough. Patient not taking: Reported on 05/04/2016 11/10/15   Hendricks Limes, MD  diclofenac sodium (VOLTAREN) 1 % GEL Apply 4 g topically 4 (four) times daily as needed. Patient not taking: Reported on 05/04/2016 06/05/15   Biagio Borg, MD  doxycycline (VIBRA-TABS) 100 MG tablet Take 1 tablet (100 mg total) by mouth 2 (two) times daily. Patient not taking: Reported on 05/04/2016 11/10/15   Hendricks Limes, MD  furosemide (LASIX) 40 MG tablet TAKE 1 TABLET EVERY DAY WITH BREAKFAST Patient not taking: Reported on 05/04/2016 12/30/15   Biagio Borg, MD  HYDROcodone-homatropine [facility]) 5-1.5 MG/5ML syrup Take 5 mLs by mouth every 6 (six) hours as needed for cough. Patient not  taking: Reported on 05/04/2016 02/24/16   Biagio Borg, MD  levofloxacin (LEVAQUIN) 250 MG tablet Take 1 tablet (250 mg total) by mouth daily. Patient not taking: Reported on 05/04/2016 02/24/16   Biagio Borg, MD  Methotrexate, PF, 10 MG/0.2ML [facility] Per rheumatology Patient not taking: Reported on 05/04/2016 02/24/16   Biagio Borg, MD  predniSONE (DELTASONE) 10 MG  tablet 1 tid pc Patient not taking: Reported on 05/04/2016 11/10/15   Hendricks Limes, MD   BP 143/66 mmHg  Pulse 60  Temp(Src) 97.8 F (36.6 C) (Oral)  Resp 16  SpO2 98% Physical Exam  Constitutional: She is oriented to person, place, and time. She appears well-developed and well-nourished. No distress.  HENT:  Head: Normocephalic and atraumatic.  Eyes: Conjunctivae and EOM are normal. Pupils are equal, round, and reactive to light.  Neck: Normal range of motion.  No midline C-spine  tenderness to palpation or step-offs appreciated. Patient has full range of motion without pain.  Grip strength, biceps, triceps 5/5 bilaterally;  can differentiate between pinprick and light touch bilaterally.   Cardiovascular: Normal rate, regular rhythm and intact distal pulses.   Pulmonary/Chest: Effort normal and breath sounds normal. No stridor. No respiratory distress. She has no wheezes. She has no rales. She exhibits no tenderness.  Abdominal: Soft. Bowel sounds are normal. She exhibits no distension and no mass. There is no tenderness. There is no rebound and no guarding.  Musculoskeletal: Normal range of motion.  Legs are shortened or rotated, extensor hallux longus is 2+ bilaterally, distal sensation is intact, cap refill is brisk. Full active range of motion to left hip and knee. No tenderness to palpation along the greater trochanter, no tenderness to percussion of lumbar spinal processes.  Neurological: She is alert and oriented to person, place, and time.  Psychiatric: She has a normal mood and affect.  Nursing note and vitals  reviewed.   ED Course  Procedures (including critical care time) Labs Review Labs Reviewed  URINALYSIS, ROUTINE W REFLEX MICROSCOPIC (NOT AT [facility]) - Abnormal; Notable for the following:    APPearance HAZY (*)    Leukocytes, UA MODERATE (*)    All other components within normal limits  CBC WITH DIFFERENTIAL/PLATELET - Abnormal; Notable for the following:    RBC 3.46 (*)    Hemoglobin 11.6 (*)    HCT 34.1 (*)    All other components within normal limits  URINE MICROSCOPIC-ADD ON - Abnormal; Notable for the following:    Squamous Epithelial / LPF 6-30 (*)    Bacteria, UA FEW (*)    All other components within normal limits  BASIC METABOLIC PANEL  I-STAT TROPOININ, ED    Imaging Review Dg Chest 2 View  05/04/2016  CLINICAL DATA:  Fall last night EXAM: CHEST  2 VIEW COMPARISON:  02/24/2016 FINDINGS: Scattered scarring in the right lung is stable. Lungs are otherwise clear. Normal heart size. No pneumothorax or pleural effusion. IMPRESSION: No active cardiopulmonary disease. Electronically Signed   By: Marybelle Killings M.D.   On: 05/04/2016 13:02   Dg Hip Unilat With Pelvis 2-3 Views Left  05/04/2016  CLINICAL DATA:  Pain following fall EXAM: DG HIP (WITH OR WITHOUT PELVIS) 2-3V LEFT COMPARISON:  February 20, 2015 FINDINGS: Frontal pelvis as well as frontal and lateral left hip images were obtained. There is no fracture or dislocation. The hip joint spaces appear unremarkable. There is lower lumbar levoscoliosis with degenerative change in the lower lumbar spine. IMPRESSION: Degenerative change and scoliosis lower lumbar spine. Hip joints appear symmetric bilaterally. No acute fracture or dislocation. Electronically Signed   By: Lowella Grip III M.D.   On: 05/04/2016 13:04   I have personally reviewed and evaluated these images and lab results as part of my medical decision-making.   EKG Interpretation   Date/Time:  Tuesday May 04 2016 12:19:37 EDT Ventricular Rate:  56 PR Interval:   150 QRS Duration: 85 QT Interval:  413 QTC Calculation: 398 R Axis:   26 Text Interpretation:  Sinus rhythm Atrial premature complex Borderline T  abnormalities, anterior leads No significant change since last tracing  Confirmed by NGUYEN, EMILY ([id number]) on 05/04/2016 12:22:25 PM Also confirmed  by Alfonse Spruce, EMILY ([id number]), editor Yehuda Mao [phone])  on 05/04/2016  12:33:21 PM      MDM   Final diagnoses:  Fall at home, initial encounter  Left hip pain    Filed Vitals:   05/04/16 1140 05/04/16 1149 05/04/16 1426  BP: 152/80  143/66  Pulse: 152 60 60  Temp: 97.5 F (36.4 C)  97.8 F (36.6 C)  TempSrc: Oral  Oral  Resp: 16  16  SpO2: 100% 100% 98%    Medications  acetaminophen (TYLENOL) tablet 650 mg (650 mg Oral Given 05/04/16 1216)    Tara Neal is 75 y.o. female presenting with Fall versus syncope last night while in the shower, she has mild left hip pain, neurovascularly intact with no deformity. There is no signs of head trauma. This may have been a mechanical fall however, she cannot provide the details, will initiate syncope workup as well.  EKG with no acute changes, blood work reassuring, urinalysis without Overt signs of infection. This is a contaminated sample with 6-30 squamous cells. 6030 whites with moderate leukocytes. Patient does not have any symptoms and cystoscopy with urinary tract infection, she follows with urology for delayed bladder emptying. She has an appointment next week. We've discussed symptoms to watch out for that would indicate a urinary tract infection and she is to follow-up with primary care or the emergency department if she develops any of these. I have asked her to assess her urologist to recheck a urine sample next week as well. Patient and her husband verbalized understanding. X-ray of the hip and chest x-ray are negative.  This is a shared visit with the attending physician who personally evaluated the patient and agrees with the care plan.    Evaluation does not show pathology that would require ongoing emergent intervention or inpatient treatment. Pt is hemodynamically stable and mentating appropriately. Discussed findings and plan with patient/guardian, who agrees with care plan. All questions answered. Return precautions discussed and outpatient follow up given.        Monico Blitz, PA-C 05/04/16 1505  Harvel Quale, MD 05/10/16 [phone]

## 2016-05-04 NOTE — Discharge Instructions (Signed)
If you continue to have pain, please follow-up with your primary care physician.  Take acetaminophen (Tylenol) up to 650 mg (this is normally 2 over-the-counter pills) up to every 4-6 hours as needed for pain control. Make sure your other medications do not contain acetaminophen (Read the labels!)  Do not hesitate to return to the emergency department for any new, worsening or concerning symptoms. Hip Pain Your hip is the joint between your upper legs and your lower pelvis. The bones, cartilage, tendons, and muscles of your hip joint perform a lot of work each day supporting your body weight and allowing you to move around. Hip pain can range from a minor ache to severe pain in one or both of your hips. Pain may be felt on the inside of the hip joint near the groin, or the outside near the buttocks and upper thigh. You may have swelling or stiffness as well.  HOME CARE INSTRUCTIONS   Take medicines only as directed by your health care provider.  Apply ice to the injured area:  Put ice in a plastic bag.  Place a towel between your skin and the bag.  Leave the ice on for 15-20 minutes at a time, 3-4 times a day.  Keep your leg raised (elevated) when possible to lessen swelling.  Avoid activities that cause pain.  Follow specific exercises as directed by your health care provider.  Sleep with a pillow between your legs on your most comfortable side.  Record how often you have hip pain, the location of the pain, and what it feels like. SEEK MEDICAL CARE IF:   You are unable to put weight on your leg.  Your hip is red or swollen or very tender to touch.  Your pain or swelling continues or worsens after 1 week.  You have increasing difficulty walking.  You have a fever. SEEK IMMEDIATE MEDICAL CARE IF:   You have fallen.  You have a sudden increase in pain and swelling in your hip. MAKE SURE YOU:   Understand these instructions.  Will watch your condition.  Will get help  right away if you are not doing well or get worse.   This information is not intended to replace advice given to you by your health care provider. Make sure you discuss any questions you have with your health care provider.   Document Released: 05/05/2010 Document Revised: 12/06/2014 Document Reviewed: 07/12/2013 Elsevier Interactive Patient Education [employer].

## 2016-05-04 NOTE — ED Notes (Signed)
Pt reports fall last night in bathroom. Co sharp left hip pain radiating up to back and down to left knee. . denies head injury nor loc. Alert and oriented x 4. denies anticoagulants intake.

## 2016-05-04 NOTE — Progress Notes (Signed)
CSW spoke with patient at bedside with husband, Cherica Verburg present. Patient reports she had just gotten out of the shower, leaned down, and she felt the pain in her hip and fell. Patient reports she has a walker and her husband assist her with ADL's. Patient reports she has good family support. No questions noted for CSW at this time.   Genice Rouge [id number] ED CSW 05/04/2016

## 2016-05-06 DIAGNOSIS — M4806 Spinal stenosis, lumbar region: Secondary | ICD-10-CM | POA: Diagnosis not present

## 2016-05-15 ENCOUNTER — Emergency Department (HOSPITAL_COMMUNITY): Payer: Medicare Other

## 2016-05-15 ENCOUNTER — Inpatient Hospital Stay (HOSPITAL_COMMUNITY)
Admission: EM | Admit: 2016-05-15 | Discharge: 2016-05-19 | DRG: 690 | Disposition: A | Discharge status: Skilled Nursing Facility | Payer: Medicare Other | Attending: Family Medicine | Admitting: Family Medicine

## 2016-05-15 ENCOUNTER — Encounter (HOSPITAL_COMMUNITY): Payer: Self-pay | Admitting: *Deleted

## 2016-05-15 DIAGNOSIS — H409 Unspecified glaucoma: Secondary | ICD-10-CM | POA: Diagnosis present

## 2016-05-15 DIAGNOSIS — M79641 Pain in right hand: Secondary | ICD-10-CM

## 2016-05-15 DIAGNOSIS — I87309 Chronic venous hypertension (idiopathic) without complications of unspecified lower extremity: Secondary | ICD-10-CM

## 2016-05-15 DIAGNOSIS — Z8249 Family history of ischemic heart disease and other diseases of the circulatory system: Secondary | ICD-10-CM

## 2016-05-15 DIAGNOSIS — M545 Low back pain: Secondary | ICD-10-CM | POA: Diagnosis not present

## 2016-05-15 DIAGNOSIS — Z87891 Personal history of nicotine dependence: Secondary | ICD-10-CM

## 2016-05-15 DIAGNOSIS — M25551 Pain in right hip: Secondary | ICD-10-CM | POA: Diagnosis present

## 2016-05-15 DIAGNOSIS — K219 Gastro-esophageal reflux disease without esophagitis: Secondary | ICD-10-CM | POA: Diagnosis present

## 2016-05-15 DIAGNOSIS — N181 Chronic kidney disease, stage 1: Secondary | ICD-10-CM

## 2016-05-15 DIAGNOSIS — R296 Repeated falls: Secondary | ICD-10-CM

## 2016-05-15 DIAGNOSIS — B370 Candidal stomatitis: Secondary | ICD-10-CM

## 2016-05-15 DIAGNOSIS — N390 Urinary tract infection, site not specified: Secondary | ICD-10-CM | POA: Diagnosis not present

## 2016-05-15 DIAGNOSIS — M25561 Pain in right knee: Secondary | ICD-10-CM

## 2016-05-15 DIAGNOSIS — F419 Anxiety disorder, unspecified: Secondary | ICD-10-CM | POA: Diagnosis present

## 2016-05-15 DIAGNOSIS — E785 Hyperlipidemia, unspecified: Secondary | ICD-10-CM | POA: Diagnosis present

## 2016-05-15 DIAGNOSIS — R269 Unspecified abnormalities of gait and mobility: Secondary | ICD-10-CM

## 2016-05-15 DIAGNOSIS — R52 Pain, unspecified: Secondary | ICD-10-CM | POA: Diagnosis not present

## 2016-05-15 DIAGNOSIS — M79642 Pain in left hand: Secondary | ICD-10-CM

## 2016-05-15 DIAGNOSIS — Z0000 Encounter for general adult medical examination without abnormal findings: Secondary | ICD-10-CM

## 2016-05-15 DIAGNOSIS — F5104 Psychophysiologic insomnia: Secondary | ICD-10-CM

## 2016-05-15 DIAGNOSIS — I10 Essential (primary) hypertension: Secondary | ICD-10-CM

## 2016-05-15 DIAGNOSIS — Q2733 Arteriovenous malformation of digestive system vessel: Secondary | ICD-10-CM

## 2016-05-15 DIAGNOSIS — R9389 Abnormal findings on diagnostic imaging of other specified body structures: Secondary | ICD-10-CM

## 2016-05-15 DIAGNOSIS — L989 Disorder of the skin and subcutaneous tissue, unspecified: Secondary | ICD-10-CM

## 2016-05-15 DIAGNOSIS — D649 Anemia, unspecified: Secondary | ICD-10-CM

## 2016-05-15 DIAGNOSIS — R413 Other amnesia: Secondary | ICD-10-CM

## 2016-05-15 DIAGNOSIS — M1612 Unilateral primary osteoarthritis, left hip: Secondary | ICD-10-CM | POA: Diagnosis present

## 2016-05-15 DIAGNOSIS — B9620 Unspecified Escherichia coli [E. coli] as the cause of diseases classified elsewhere: Secondary | ICD-10-CM | POA: Diagnosis present

## 2016-05-15 DIAGNOSIS — R05 Cough: Secondary | ICD-10-CM

## 2016-05-15 DIAGNOSIS — G8929 Other chronic pain: Secondary | ICD-10-CM | POA: Diagnosis present

## 2016-05-15 DIAGNOSIS — M5416 Radiculopathy, lumbar region: Secondary | ICD-10-CM | POA: Diagnosis present

## 2016-05-15 DIAGNOSIS — F0390 Unspecified dementia without behavioral disturbance: Secondary | ICD-10-CM | POA: Diagnosis present

## 2016-05-15 DIAGNOSIS — E0822 Diabetes mellitus due to underlying condition with diabetic chronic kidney disease: Secondary | ICD-10-CM

## 2016-05-15 DIAGNOSIS — R410 Disorientation, unspecified: Secondary | ICD-10-CM

## 2016-05-15 DIAGNOSIS — B9629 Other Escherichia coli [E. coli] as the cause of diseases classified elsewhere: Secondary | ICD-10-CM | POA: Diagnosis not present

## 2016-05-15 DIAGNOSIS — R059 Cough, unspecified: Secondary | ICD-10-CM

## 2016-05-15 DIAGNOSIS — Z833 Family history of diabetes mellitus: Secondary | ICD-10-CM

## 2016-05-15 DIAGNOSIS — I951 Orthostatic hypotension: Secondary | ICD-10-CM

## 2016-05-15 DIAGNOSIS — M25552 Pain in left hip: Secondary | ICD-10-CM | POA: Diagnosis not present

## 2016-05-15 LAB — CBC WITH DIFFERENTIAL/PLATELET
BASOS ABS: 0 10*3/uL (ref 0.0–0.1)
Basophils Relative: 0 %
EOS ABS: 0 10*3/uL (ref 0.0–0.7)
EOS PCT: 0 %
HCT: 39.3 % (ref 36.0–46.0)
Hemoglobin: 13.5 g/dL (ref 12.0–15.0)
LYMPHS ABS: 1.4 10*3/uL (ref 0.7–4.0)
Lymphocytes Relative: 12 %
MCH: 33.6 pg (ref 26.0–34.0)
MCHC: 34.4 g/dL (ref 30.0–36.0)
MCV: 97.8 fL (ref 78.0–100.0)
Monocytes Absolute: 0.3 10*3/uL (ref 0.1–1.0)
Monocytes Relative: 2 %
Neutro Abs: 10.2 10*3/uL — ABNORMAL HIGH (ref 1.7–7.7)
Neutrophils Relative %: 86 %
PLATELETS: 375 10*3/uL (ref 150–400)
RBC: 4.02 MIL/uL (ref 3.87–5.11)
RDW: 14.1 % (ref 11.5–15.5)
WBC: 11.9 10*3/uL — AB (ref 4.0–10.5)

## 2016-05-15 LAB — URINALYSIS, ROUTINE W REFLEX MICROSCOPIC
BILIRUBIN URINE: NEGATIVE
Glucose, UA: NEGATIVE mg/dL
HGB URINE DIPSTICK: NEGATIVE
Ketones, ur: NEGATIVE mg/dL
Nitrite: POSITIVE — AB
PROTEIN: NEGATIVE mg/dL
Specific Gravity, Urine: 1.02 (ref 1.005–1.030)
pH: 5.5 (ref 5.0–8.0)

## 2016-05-15 LAB — BASIC METABOLIC PANEL
Anion gap: 9 (ref 5–15)
BUN: 15 mg/dL (ref 6–20)
CALCIUM: 9.4 mg/dL (ref 8.9–10.3)
CO2: 24 mmol/L (ref 22–32)
Chloride: 101 mmol/L (ref 101–111)
Creatinine, Ser: 0.69 mg/dL (ref 0.44–1.00)
GFR calc Af Amer: >60 mL/min (ref >60)
Glucose, Bld: 136 mg/dL — ABNORMAL HIGH (ref 65–99)
POTASSIUM: 3.8 mmol/L (ref 3.5–5.1)
SODIUM: 134 mmol/L — AB (ref 135–145)

## 2016-05-15 LAB — URINE MICROSCOPIC-ADD ON

## 2016-05-15 MED ORDER — LORATADINE 10 MG PO TABS
10.0000 mg | ORAL_TABLET | Freq: Every day | ORAL | Status: DC
  Administered 2016-05-16 – 2016-05-19 (×4): 10 mg via ORAL
  Filled 2016-05-15 (×4): qty 1

## 2016-05-15 MED ORDER — CALCIUM CARBONATE 1250 (500 CA) MG PO TABS
1250.0000 mg | ORAL_TABLET | Freq: Two times a day (BID) | ORAL | Status: DC
  Administered 2016-05-16 – 2016-05-19 (×7): 1250 mg via ORAL
  Filled 2016-05-15 (×7): qty 1

## 2016-05-15 MED ORDER — DEXTROSE 5 % IV SOLN
1.0000 g | INTRAVENOUS | Status: DC
  Administered 2016-05-16 – 2016-05-18 (×3): 1 g via INTRAVENOUS
  Filled 2016-05-15 (×4): qty 10

## 2016-05-15 MED ORDER — SODIUM CHLORIDE 0.9 % IV SOLN
INTRAVENOUS | Status: AC
  Administered 2016-05-15: 19:00:00 via INTRAVENOUS

## 2016-05-15 MED ORDER — ENOXAPARIN SODIUM 40 MG/0.4ML ~~LOC~~ SOLN
40.0000 mg | Freq: Every day | SUBCUTANEOUS | Status: DC
  Administered 2016-05-15 – 2016-05-18 (×4): 40 mg via SUBCUTANEOUS
  Filled 2016-05-15 (×4): qty 0.4

## 2016-05-15 MED ORDER — LATANOPROST 0.005 % OP SOLN
1.0000 [drp] | Freq: Every day | OPHTHALMIC | Status: DC
  Administered 2016-05-15 – 2016-05-18 (×4): 1 [drp] via OPHTHALMIC
  Filled 2016-05-15: qty 2.5

## 2016-05-15 MED ORDER — PANTOPRAZOLE SODIUM 40 MG PO TBEC
40.0000 mg | DELAYED_RELEASE_TABLET | Freq: Every day | ORAL | Status: DC
Start: 2016-05-16 — End: 2016-05-19
  Administered 2016-05-16 – 2016-05-19 (×4): 40 mg via ORAL
  Filled 2016-05-15 (×4): qty 1

## 2016-05-15 MED ORDER — HYDROCODONE-ACETAMINOPHEN 5-325 MG PO TABS
1.0000 | ORAL_TABLET | Freq: Once | ORAL | Status: AC
  Administered 2016-05-15: 1 via ORAL
  Filled 2016-05-15: qty 1

## 2016-05-15 MED ORDER — TIMOLOL MALEATE 0.5 % OP SOLN
1.0000 [drp] | Freq: Every day | OPHTHALMIC | Status: DC
  Administered 2016-05-16 – 2016-05-19 (×4): 1 [drp] via OPHTHALMIC
  Filled 2016-05-15: qty 5

## 2016-05-15 MED ORDER — ATENOLOL 50 MG PO TABS
50.0000 mg | ORAL_TABLET | Freq: Every day | ORAL | Status: DC
  Administered 2016-05-15 – 2016-05-19 (×5): 50 mg via ORAL
  Filled 2016-05-15 (×5): qty 1

## 2016-05-15 MED ORDER — DEXTROSE 5 % IV SOLN
1.0000 g | Freq: Once | INTRAVENOUS | Status: AC
  Administered 2016-05-15: 1 g via INTRAVENOUS
  Filled 2016-05-15: qty 10

## 2016-05-15 MED ORDER — FUROSEMIDE 40 MG PO TABS: 40.0000 mg | ORAL_TABLET | Freq: Every day | ORAL | Status: DC

## 2016-05-15 MED ORDER — HYDROCODONE-ACETAMINOPHEN 7.5-325 MG PO TABS
1.0000 | ORAL_TABLET | Freq: Four times a day (QID) | ORAL | Status: DC | PRN
Start: 2016-05-15 — End: 2016-05-19
  Administered 2016-05-15: 1 via ORAL
  Administered 2016-05-16 – 2016-05-17 (×5): 2 via ORAL
  Filled 2016-05-15 (×4): qty 2
  Filled 2016-05-15: qty 1
  Filled 2016-05-15: qty 2

## 2016-05-15 MED ORDER — SIMVASTATIN 40 MG PO TABS
40.0000 mg | ORAL_TABLET | Freq: Every day | ORAL | Status: DC
  Administered 2016-05-16 – 2016-05-18 (×3): 40 mg via ORAL
  Filled 2016-05-15 (×3): qty 1

## 2016-05-15 NOTE — Progress Notes (Signed)
Pharmacy Antibiotic Note  Tara Neal is a 75 y.o. female admitted on 05/15/2016 with UTI.  Pharmacy has been consulted for ceftriaxone dosing.  Plan: - ceftriaxone 1gm IV q24h for UTI  - rash to PCN but typically acceptable to continue with cephalosporin w/ such rxn  - no renal dose adjustment required, sign-off for dosing   Temp (24hrs), Avg:98.7 F (37.1 C), Min:98.7 F (37.1 C), Max:98.7 F (37.1 C)   Recent Labs Lab 05/15/16 1639  WBC 11.9*  CREATININE 0.69    CrCl cannot be calculated (Unknown ideal weight.).    Allergies  Allergen Reactions  . Codeine Nausea Only  . Sulfonamide Derivatives Nausea Only    REACTION: Nausea  . Penicillins Rash    .Marland KitchenHas patient had a PCN reaction causing immediate rash, facial/tongue/throat swelling, SOB or lightheadedness with hypotension: No Has patient had a PCN reaction causing severe rash involving mucus membranes or skin necrosis: No Has patient had a PCN reaction that required hospitalization No Has patient had a PCN reaction occurring within the last 10 years: No If all of the above answers are "NO", then may proceed with Cephalosporin use.   Marland Kitchen Percocet [Oxycodone-Acetaminophen] Itching    Thank you for allowing pharmacy to be a part of this patient's care.  Doreene Eland, PharmD, BCPS.   Pager: [phone] 05/15/2016 7:12 PM

## 2016-05-15 NOTE — H&P (Signed)
History and Physical  Tara Neal [id number] DOB: October 29, 1941 DOA: 05/15/2016  PCP:  [name], MD   Chief Complaint:  Hip pain / confusion   History of Present Illness:  Patient is a 75 yo female with history of HTN, OA, chronic back pain who came with cc of L.hip pain for the past few weeks with progressive weakness. The pain is constant and radiating to the sole of the left foot laterally with occasional numbness/tingling. She has not been able to stand up or go to the bathroom on her own. She also has chronic lower back pain. No incontinence except when she can't make it to the bathroom. No saddle anesthesia. She also has had frequency w/o dysuria with confusion over the past couple of days. 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 No fever/chills. No dyspnea/cough/N/V/D/C/abd pain.   Review of Systems:  CONSTITUTIONAL:     No night sweats.  No fatigue.  No fever. No chills. Eyes:                            No visual changes.  No eye pain.  No eye discharge.   ENT:                              No epistaxis.  No sinus pain.  No sore throat.   No congestion. RESPIRATORY:           No cough.  No wheeze.  No hemoptysis.  No dyspnea CARDIOVASCULAR   :  No chest pains.  No palpitations. GASTROINTESTINAL:  No abdominal pain.  No nausea. No vomiting.  No diarrhea. No obstructive symptoms.  No discharge.  No pain.   MUSCULOSKELETAL:  +musculoskeletal pain.  No joint swelling.  +arthritis. NEUROLOGICAL:        +confusion.  +weakness. No headache. No seizure. PSYCHIATRIC:             No depression. No anxiety. No suicidal ideation. SKIN:                             No rashes.  No lesions.  No wounds. ENDOCRINE:                No weight loss.  No polydipsia.  No polyuria.  No polyphagia. HEMATOLOGIC:           No purpura.  No petechiae.  No bleeding.  ALLERGIC                 : No pruritus.  No angioedema Other:  Past Medical and Surgical History:   Past Medical History    Diagnosis Date  . Hyperlipidemia   . Hypertension   . Mild obesity   . Spontaneous pneumothorax     1968  . GERD (gastroesophageal reflux disease)   . Osteoarthritis   . Low back pain   . Osteoporosis   . Hormone replacement therapy (postmenopausal)   . Anxiety   . Osteopenia   . COPD (chronic obstructive pulmonary disease) ([place])   . Diverticulosis of colon   . Shortness of breath     with exertion   . Anemia     hx of   . Chronic insomnia 04/22/2012  . Scoliosis   . Pneumonia     hx of 5 years ago   . Migraine headache  denies  . Umbilical hernia [id number]  . Glaucoma   . Allergy    Past Surgical History  Procedure Laterality Date  . Tonsillectomy    . Lumbar disc surgery       x 2  . Other surgical history      benign growth removed from right under ear at age 47   . Excision/release bursa hip  04/26/2012    Procedure: EXCISION/RELEASE BURSA HIP;  Surgeon: Gearlean Alf, MD;  Location: [facility];  Service: Orthopedics;  Laterality: Right;  . Hip adductor tenotomy    . Eye surgery      - bil cataract surg and Retina surgery to left eye  . Tonsillectomy    . Hernia repair  [id number]    umbilical hernia  . Shoulder arthroscopy      left shoulder  . Wrist fracture surgery      plate in left wrist/ fractured 2 times    Social History:   reports that she quit smoking about 34 years ago. Her smoking use included Cigarettes. She quit after 20 years of use. She has never used smokeless tobacco. She reports that she drinks alcohol. She reports that she does not use illicit drugs.    Allergies  Allergen Reactions  . Codeine Nausea Only  . Sulfonamide Derivatives Nausea Only    REACTION: Nausea  . Penicillins Rash    .Marland KitchenHas patient had a PCN reaction causing immediate rash, facial/tongue/throat swelling, SOB or lightheadedness with hypotension: No Has patient had a PCN reaction causing severe rash involving mucus membranes or skin necrosis: No Has patient had a  PCN reaction that required hospitalization No Has patient had a PCN reaction occurring within the last 10 years: No If all of the above answers are "NO", then may proceed with Cephalosporin use.   Marland Kitchen Percocet [Oxycodone-Acetaminophen] Itching    Family History  Problem Relation Age of Onset  . Hyperlipidemia Mother   . Heart disease Mother   . Diabetes Mother       Prior to Admission medications   Medication Sig Start Date End Date Taking? Authorizing Provider  acetaminophen (TYLENOL) 325 MG tablet Take 650 mg by mouth at bedtime.   Yes Historical Provider, MD  alendronate (FOSAMAX) 70 MG tablet TAKE 1 TABLET EVERY 7 DAYS (ON SATURDAYS). TAKE WITH A FULL GLASS OF WATER ON AN EMPTY STOMACH 02/27/16  Yes Biagio Borg, MD  Ascorbic Acid (VITAMIN C) 1000 MG tablet Take 1,000 mg by mouth daily.   Yes Historical Provider, MD  atenolol (TENORMIN) 50 MG tablet TAKE 1 TABLET EVERY DAY Patient taking differently: TAKE 1 TABLET PO EVERY DAY 03/15/16  Yes Biagio Borg, MD  benazepril (LOTENSIN) 40 MG tablet TAKE 1 TABLET EVERY DAY Patient taking differently: TAKE 1 TABLET PO EVERY DAY 03/15/16  Yes Biagio Borg, MD  calcium carbonate (OS-CAL) 600 MG TABS Take 600 mg by mouth 2 (two) times daily with a meal.   Yes Historical Provider, MD  cetirizine (ZYRTEC) 10 MG tablet Take 10 mg by mouth daily with breakfast.    Yes Historical Provider, MD  Cholecalciferol (VITAMIN D3) 1000 UNITS CAPS Take 1 capsule by mouth daily.   Yes Historical Provider, MD  FOLIC ACID PO Take 1 mg by mouth daily.    Yes Historical Provider, MD  furosemide (LASIX) 40 MG tablet TAKE 1 TABLET EVERY DAY WITH BREAKFAST 12/30/15  Yes Biagio Borg, MD  latanoprost (XALATAN) 0.005 % ophthalmic solution Place  1 drop into both eyes at bedtime.  10/09/13  Yes Historical Provider, MD  MELATONIN PO Take 2 tablets by mouth at bedtime.    Yes Historical Provider, MD  Multiple Vitamin (MULTIVITAMIN) tablet Take 1 tablet by mouth daily.   Yes  Historical Provider, MD  Omega-3 Fatty Acids (FISH OIL) 1200 MG CAPS Take by mouth. Take 2 a day   Yes Historical Provider, MD  omeprazole (PRILOSEC) 20 MG capsule TAKE 2 CAPSULES EVERY DAY 03/15/16  Yes Biagio Borg, MD  simvastatin (ZOCOR) 40 MG tablet TAKE 1 TABLET EVERY DAY  AT  [facility] 03/15/16  Yes Biagio Borg, MD  timolol (TIMOPTIC) 0.5 % ophthalmic solution Place 1 drop into both eyes daily.   Yes Historical Provider, MD  traMADol (ULTRAM) 50 MG tablet Take 50 mg by mouth every 6 (six) hours as needed for moderate pain or severe pain.  05/06/16  Yes Historical Provider, MD  venlafaxine XR (EFFEXOR-XR) 150 MG 24 hr capsule TAKE 1 CAPSULE EVERY DAY WITH BREAKFAST 03/15/16  Yes Biagio Borg, MD  benzonatate (TESSALON PERLES) 100 MG capsule Take 1 capsule (100 mg total) by mouth 3 (three) times daily as needed for cough. Patient not taking: Reported on 05/04/2016 11/10/15   Hendricks Limes, MD  diclofenac sodium (VOLTAREN) 1 % GEL Apply 4 g topically 4 (four) times daily as needed. Patient not taking: Reported on 05/04/2016 06/05/15   Biagio Borg, MD  doxycycline (VIBRA-TABS) 100 MG tablet Take 1 tablet (100 mg total) by mouth 2 (two) times daily. Patient not taking: Reported on 05/04/2016 11/10/15   Hendricks Limes, MD  HYDROcodone-homatropine [facility]) 5-1.5 MG/5ML syrup Take 5 mLs by mouth every 6 (six) hours as needed for cough. Patient not taking: Reported on 05/04/2016 02/24/16   Biagio Borg, MD  levofloxacin (LEVAQUIN) 250 MG tablet Take 1 tablet (250 mg total) by mouth daily. Patient not taking: Reported on 05/04/2016 02/24/16   Biagio Borg, MD  meloxicam (MOBIC) 7.5 MG tablet Take 1 tablet (7.5 mg total) by mouth daily. Patient not taking: Reported on 05/15/2016 03/31/16   Biagio Borg, MD  methotrexate 250 MG/10ML injection Inject 250 mg into the vein once a week.     Historical Provider, MD  Methotrexate, PF, 10 MG/0.2ML SOAJ Per rheumatology Patient not taking: Reported on 05/04/2016 02/24/16   Biagio Borg, MD  predniSONE (DELTASONE) 10 MG tablet 1 tid pc Patient not taking: Reported on 05/04/2016 11/10/15   Hendricks Limes, MD    Physical Exam: BP 165/81 mmHg  Pulse 79  Temp(Src) 98.7 F (37.1 C) (Oral)  Resp 18  SpO2 98%  GENERAL :   Alert and cooperative, and appears to be in no acute distress. HEAD:           normocephalic. EYES:            PERRL, EOMI.  vision is grossly intact. EARS:           hearing grossly intact. NOSE:           No nasal discharge. THROAT:     Oral cavity and pharynx normal.   NECK:          supple, non-tender. CARDIAC:    Normal S1 and S2. No gallop. No murmurs.  Vascular:     no peripheral edema. LUNGS:       Clear to auscultation  ABDOMEN: Positive bowel sounds. Soft, nondistended, nontender. No guarding or rebound.  MSK:           No joint erythema. Left hip tenderness to palpation  EXT           : No significant deformity or joint abnormality. Neuro        : Alert, oriented to person, place, and time.                      CN II-XII intact.                       Strength and sensation symmetric and intact throughout.                       Can't stand due to pain. SKIN:            No rash. No lesions. PSYCH:       No hallucination. Patient is not suicidal.          Labs on Admission:  Reviewed.   Radiological Exams on Admission: Ct Lumbar Spine Wo Contrast  05/15/2016  CLINICAL DATA:  patient comes from home where she lives with her husband. Patient c/o left hip pain which started 2 weeks ago. Pain radiates in to left leg. Patient was seen here and evaluated for same at beginning of June. LBP EXAM: CT LUMBAR SPINE WITHOUT CONTRAST TECHNIQUE: Multidetector CT imaging of the lumbar spine was performed without intravenous contrast administration. Multiplanar CT image reconstructions were also generated. COMPARISON:  Lumbar spine radiographs, 06/23/2015 and lumbar MRI, 05/09/2015. FINDINGS: No acute fracture. There is a marked levoscoliosis,  apex at L3, stable from the prior studies. There is a slight retrolisthesis of L1 on L2 and L2 on L3 is with a mild anterolisthesis of L5 on S1. This is also stable from the prior lumbar MRI. There is marked loss of disc height with endplate sclerosis and endplate cystic change at T12-L1, L1-L2, L2-L3 and L3-L4. The L4-L5 disc is relatively well preserved. There is moderate loss disc height along the left aspect of the L5-S1 disc. The bones are diffusely demineralized. T12-L1: No disc herniation. No significant central stenosis. Lateral recesses are well preserved. Moderate left and mild right facet degenerative change. Mild left neural foraminal narrowing. L1-L2: Mild disc bulging. Moderate right and mild left facet degenerative change. Central spinal canal and lateral recesses are well preserved. No significant neural foraminal narrowing. L2-L3: Diffuse spondylitis bulging. Moderate facet degenerative change. Mild central stenosis with the AP diameter of the canal narrowed to 1 cm. Moderate right lateral recess narrowing. Moderate right neural foraminal narrowing. L3-L4: Diffuse spondylotic disc bulging. Central spinal stenosis, mild-to-moderate, AP diameter narrowed to 8 mm. The with moderate bilateral facet degenerative change, greater on the right. Lateral recesses are relatively well preserved. Mild right neural foraminal narrowing. L4-L5: Diffuse spondylotic disc bulging. Advanced facet degenerative change. Moderate central spinal canal narrowing with AP diameter narrowed to 7 mm. Moderate left and mild right lateral recess narrowing. Mild neural foraminal narrowing. L5-S1: Advanced facet degenerative change. The diffuse disc bulging. Central spinal canal is relatively well preserved. Moderate right and moderate to severe left lateral recess narrowing. Mild right and severe left neural foraminal narrowing. Soft tissues are unremarkable. IMPRESSION: 1. No acute fracture or acute finding. 2. Extensive  degenerative changes as detailed above without significant change when compared to the prior lumbar MRI. No disc herniation. Electronically Signed   By: Lajean Manes M.D.   On: 05/15/2016 16:53  Assessment/Plan  L.Hip pain: likely due to OA. CT w/o evidence of fracture. No sx of infections.   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 Will need PT and pain management. Will start Norco Q4H. May need pain management referral. She is not a surgical candidate per family.   UTI: Ucx pending. Started on rocephin.   AMS: likely due to UTI   Hypertension: worsened by pain most likely, will aim to control pain first with antihypertensive as needed.   Will hold lasix for now.   Severe OA: patient taking weekly MTX and pren 10 mg daily per family    Input & Output: NA Lines & Tubes: PIV, Foley DVT prophylaxis:  [place] enoxaparin GI prophylaxis: PPI Consultants: PT Code Status: Full  Family Communication: at bedside  Disposition Plan: Obs with PT eval.    Gennaro Africa M.D [facility]

## 2016-05-15 NOTE — ED Notes (Signed)
Pt refusing cath.  Visitor says she's in too much pain to use regularly.   Open to ideas

## 2016-05-15 NOTE — ED Notes (Signed)
Pt's family thinks placement ([facility] or NH) might be the best option for both the pt and her husband.

## 2016-05-15 NOTE — ED Notes (Signed)
Pt is now ready for cath*

## 2016-05-15 NOTE — ED Notes (Signed)
Per EMS - patient comes from home where she lives with her husband.  Patient c/o left hip pain which started 2 weeks ago.  Pain radiates in to left leg.  Patient was seen here and evaluated for same at beginning of June.  Patient had hip surgery 5 years ago (exact procedure unknown).  Patient's vitals 169/102, HR 83, RR 16, 98% on RA.

## 2016-05-15 NOTE — ED Provider Notes (Signed)
CSN: [id number]     Arrival date & time 05/15/16  1542 History   First MD Initiated Contact with Patient 05/15/16 1556     Chief Complaint  Patient presents with  . Hip Pain    left     (Consider location/radiation/quality/duration/timing/severity/associated sxs/prior Treatment) Patient is a 75 y.o. female presenting with hip pain. The history is provided by the patient and the spouse.  Hip Pain This is a recurrent problem. Episode onset: has been present for months but much worse in the last few weeks. The problem occurs constantly. The problem has been gradually worsening. Associated symptoms comments: Some back pain with it and severe with attempting to walk.  No swelling or redness.  No abd pain, new urinary complaints or appetite changes.  Pt seen here several weeks ago with x-rays which showed degenerative changes.  Pt saw Dr. Warrick Parisian this week and felt this was not her hip.  Husband states she laid in bed for the last few days refusing to get up much due to worsening pain weakness and confusion.. The symptoms are aggravated by walking. The symptoms are relieved by rest. Treatments tried: tramadol and tylenol. The treatment provided no relief.    Past Medical History  Diagnosis Date  . Hyperlipidemia   . Hypertension   . Mild obesity   . Spontaneous pneumothorax     1968  . GERD (gastroesophageal reflux disease)   . Osteoarthritis   . Low back pain   . Osteoporosis   . Hormone replacement therapy (postmenopausal)   . Anxiety   . Osteopenia   . COPD (chronic obstructive pulmonary disease) ([place])   . Diverticulosis of colon   . Shortness of breath     with exertion   . Anemia     hx of   . Chronic insomnia 04/22/2012  . Scoliosis   . Pneumonia     hx of 5 years ago   . Migraine headache     denies  . Umbilical hernia [id number]  . Glaucoma   . Allergy    Past Surgical History  Procedure Laterality Date  . Tonsillectomy    . Lumbar disc surgery       x 2  . Other  surgical history      benign growth removed from right under ear at age 9   . Excision/release bursa hip  04/26/2012    Procedure: EXCISION/RELEASE BURSA HIP;  Surgeon: Gearlean Alf, MD;  Location: [facility];  Service: Orthopedics;  Laterality: Right;  . Hip adductor tenotomy    . Eye surgery      - bil cataract surg and Retina surgery to left eye  . Tonsillectomy    . Hernia repair  [id number]    umbilical hernia  . Shoulder arthroscopy      left shoulder  . Wrist fracture surgery      plate in left wrist/ fractured 2 times   Family History  Problem Relation Age of Onset  . Hyperlipidemia Mother   . Heart disease Mother   . Diabetes Mother    Social History  Substance Use Topics  . Smoking status: Former Smoker -- 20 years    Types: Cigarettes    Quit date: 11/29/1981  . Smokeless tobacco: Never Used  . Alcohol Use: Yes     Comment: one glass of wine every 3 months   OB History    No data available     Review of Systems  Neurological: Positive for  weakness.       Worsening confusion  All other systems reviewed and are negative.     Allergies  Codeine; Sulfonamide derivatives; Penicillins; and Percocet  Home Medications   Prior to Admission medications   Medication Sig Start Date End Date Taking? Authorizing Provider  alendronate (FOSAMAX) 70 MG tablet TAKE 1 TABLET EVERY 7 DAYS (ON SATURDAYS). TAKE WITH A FULL GLASS OF WATER ON AN EMPTY STOMACH 02/27/16   Biagio Borg, MD  Ascorbic Acid (VITAMIN C) 1000 MG tablet Take 1,000 mg by mouth daily.    Historical Provider, MD  atenolol (TENORMIN) 50 MG tablet TAKE 1 TABLET EVERY DAY 03/15/16   Biagio Borg, MD  benazepril (LOTENSIN) 40 MG tablet TAKE 1 TABLET EVERY DAY 03/15/16   Biagio Borg, MD  benzonatate (TESSALON PERLES) 100 MG capsule Take 1 capsule (100 mg total) by mouth 3 (three) times daily as needed for cough. Patient not taking: Reported on 05/04/2016 11/10/15   Hendricks Limes, MD  calcium carbonate (OS-CAL)  600 MG TABS Take 600 mg by mouth 2 (two) times daily with a meal.    Historical Provider, MD  cetirizine (ZYRTEC) 10 MG tablet Take 10 mg by mouth daily with breakfast.     Historical Provider, MD  Cholecalciferol (VITAMIN D3) 1000 UNITS CAPS Take 1 capsule by mouth daily.    Historical Provider, MD  diclofenac sodium (VOLTAREN) 1 % GEL Apply 4 g topically 4 (four) times daily as needed. Patient not taking: Reported on 05/04/2016 06/05/15   Biagio Borg, MD  doxycycline (VIBRA-TABS) 100 MG tablet Take 1 tablet (100 mg total) by mouth 2 (two) times daily. Patient not taking: Reported on 05/04/2016 11/10/15   Hendricks Limes, MD  FOLIC ACID PO Take 1 tablet by mouth daily.     Historical Provider, MD  furosemide (LASIX) 40 MG tablet TAKE 1 TABLET EVERY DAY WITH BREAKFAST Patient not taking: Reported on 05/04/2016 12/30/15   Biagio Borg, MD  HYDROcodone-homatropine [facility]) 5-1.5 MG/5ML syrup Take 5 mLs by mouth every 6 (six) hours as needed for cough. Patient not taking: Reported on 05/04/2016 02/24/16   Biagio Borg, MD  latanoprost (XALATAN) 0.005 % ophthalmic solution Place 1 drop into both eyes at bedtime.  10/09/13   Historical Provider, MD  levofloxacin (LEVAQUIN) 250 MG tablet Take 1 tablet (250 mg total) by mouth daily. Patient not taking: Reported on 05/04/2016 02/24/16   Biagio Borg, MD  meloxicam (MOBIC) 7.5 MG tablet Take 1 tablet (7.5 mg total) by mouth daily. 03/31/16   Biagio Borg, MD  methotrexate 250 MG/10ML injection Inject 25 mg into the vein once a week. 1 ml    Historical Provider, MD  Methotrexate, PF, 10 MG/0.2ML SOAJ Per rheumatology Patient not taking: Reported on 05/04/2016 02/24/16   Biagio Borg, MD  Multiple Vitamin (MULTIVITAMIN) tablet Take 1 tablet by mouth daily.    Historical Provider, MD  Omega-3 Fatty Acids (FISH OIL) 1200 MG CAPS Take by mouth. Take 2 a day    Historical Provider, MD  omeprazole (PRILOSEC) 20 MG capsule TAKE 2 CAPSULES EVERY DAY 03/15/16   Biagio Borg, MD    predniSONE (DELTASONE) 10 MG tablet 1 tid pc Patient not taking: Reported on 05/04/2016 11/10/15   Hendricks Limes, MD  simvastatin (ZOCOR) 40 MG tablet TAKE 1 TABLET EVERY DAY  AT  [facility] 03/15/16   Biagio Borg, MD  timolol (TIMOPTIC) 0.5 % ophthalmic solution  Place 1 drop into both eyes daily.    Historical Provider, MD  venlafaxine XR (EFFEXOR-XR) 150 MG 24 hr capsule TAKE 1 CAPSULE EVERY DAY WITH BREAKFAST 03/15/16   Biagio Borg, MD   BP 165/81 mmHg  Pulse 79  Temp(Src) 98.7 F (37.1 C) (Oral)  Resp 18  SpO2 98% Physical Exam  Constitutional: She appears well-developed and well-nourished. No distress.  HENT:  Head: Normocephalic and atraumatic.  Mouth/Throat: Oropharynx is clear and moist.  Eyes: Conjunctivae and EOM are normal. Pupils are equal, round, and reactive to light.  Neck: Normal range of motion. Neck supple.  Cardiovascular: Normal rate, regular rhythm and intact distal pulses.   No murmur heard. Pulmonary/Chest: Effort normal and breath sounds normal. No respiratory distress. She has no wheezes. She has no rales.  Abdominal: Soft. She exhibits no distension. There is no tenderness. There is no rebound and no guarding.  Musculoskeletal: Normal range of motion. She exhibits tenderness. She exhibits no edema.       Back:  Able to extend/flex, internally and external rotate the hip without pain.  Normal sensation and 5/5 strength in left upper and lower leg.  2+ dp/pt pulse in the left foot.  Neurological: She is alert.  Oriented to person and place  Skin: Skin is warm and dry. No rash noted. No erythema.  Psychiatric: She has a normal mood and affect. Her behavior is normal.  Nursing note and vitals reviewed.   ED Course  Procedures (including critical care time) Labs Review Labs Reviewed  URINALYSIS, ROUTINE W REFLEX MICROSCOPIC (NOT AT [facility]) - Abnormal; Notable for the following:    APPearance CLOUDY (*)    Nitrite POSITIVE (*)    Leukocytes, UA MODERATE (*)     All other components within normal limits  CBC WITH DIFFERENTIAL/PLATELET - Abnormal; Notable for the following:    WBC 11.9 (*)    Neutro Abs 10.2 (*)    All other components within normal limits  BASIC METABOLIC PANEL - Abnormal; Notable for the following:    Sodium 134 (*)    Glucose, Bld 136 (*)    All other components within normal limits  URINE MICROSCOPIC-ADD ON - Abnormal; Notable for the following:    Squamous Epithelial / LPF 0-5 (*)    Bacteria, UA MANY (*)    All other components within normal limits  URINE CULTURE    Imaging Review Ct Lumbar Spine Wo Contrast  05/15/2016  CLINICAL DATA:  patient comes from home where she lives with her husband. Patient c/o left hip pain which started 2 weeks ago. Pain radiates in to left leg. Patient was seen here and evaluated for same at beginning of June. LBP EXAM: CT LUMBAR SPINE WITHOUT CONTRAST TECHNIQUE: Multidetector CT imaging of the lumbar spine was performed without intravenous contrast administration. Multiplanar CT image reconstructions were also generated. COMPARISON:  Lumbar spine radiographs, 06/23/2015 and lumbar MRI, 05/09/2015. FINDINGS: No acute fracture. 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 There is a slight retrolisthesis of L1 on L2 and L2 on L3 is with a mild anterolisthesis of L5 on S1. This is also stable from the prior lumbar MRI. There is marked loss of disc height with endplate sclerosis and endplate cystic change at T12-L1, L1-L2, L2-L3 and L3-L4. The L4-L5 disc is relatively well preserved. There is moderate loss disc height along the left aspect of the L5-S1 disc. The bones are diffusely demineralized. T12-L1: No disc herniation.  No significant central stenosis. Lateral recesses are well preserved. Moderate left and mild right facet degenerative change. Mild left neural foraminal narrowing. L1-L2: Mild disc bulging. Moderate right and mild left facet degenerative change. Central  spinal canal and lateral recesses are well preserved. No significant neural foraminal narrowing. L2-L3: Diffuse spondylitis bulging. Moderate facet degenerative change. Mild central stenosis with the AP diameter of the canal narrowed to 1 cm. Moderate right lateral recess narrowing. Moderate right neural foraminal narrowing. L3-L4: Diffuse spondylotic disc bulging. Central spinal stenosis, mild-to-moderate, AP diameter narrowed to 8 mm. The with moderate bilateral facet degenerative change, greater on the right. Lateral recesses are relatively well preserved. Mild right neural foraminal narrowing. L4-L5: Diffuse spondylotic disc bulging. Advanced facet degenerative change. Moderate central spinal canal narrowing with AP diameter narrowed to 7 mm. Moderate left and mild right lateral recess narrowing. Mild neural foraminal narrowing. L5-S1: Advanced facet degenerative change. The diffuse disc bulging. Central spinal canal is relatively well preserved. Moderate right and moderate to severe left lateral recess narrowing. Mild right and severe left neural foraminal narrowing. Soft tissues are unremarkable. IMPRESSION: 1. No acute fracture or acute finding. 2. Extensive degenerative changes as detailed above without significant change when compared to the prior lumbar MRI. No disc herniation. Electronically Signed   By: Lajean Manes M.D.   On: 05/15/2016 16:53   I have personally reviewed and evaluated these images and lab results as part of my medical decision-making.   EKG Interpretation None      MDM   Final diagnoses:  UTI (lower urinary tract infection)  Delirium    Patient presenting with her husband per worsening severe left hip and back pain that has been worsening over the last several months but more specifically in the last few weeks. She saw Dr. Arlana Hove this past week and he did not feel that her issues were related to her hip. She was given tramadol and she has follow-up with Dr. Maxie Better next  Friday as there is concern for potential radiculopathy. Patient does have a history of scoliosis but no prior back surgeries. She has ongoing issues with incontinence but nothing new. She denies any infectious symptoms, appetite changes her husband just states the pain is so bad she is not getting out of bed. She's been taking Tylenol and tramadol without improvement.  Worse over the last few days with inability to get out of bed, and worsening confusion Exam patient can range her left hip without any difficulty. Axial loading does not reproduce pain. The distribution that she points to indicates most likely an L5 radiculopathy. She is able to move the entire leg on her own and has 5 out of 5 strength in the upper and lower portion of the left leg. Sensation is intact. Pulses are 2+ in the DP and PT the left foot.  Husband does remember her leaning over and having some pain in her back which the patient does not remember. Will do a CT to ensure no new compression fractures of the spine. She had a hip image done 2 weeks ago which showed degenerative changes. Will ensure no evidence of urinary tract infection. CBC, BMP, UA, CT of the lumbar spine pending. Patient given 1 Vicodin for pain control.  6:21 PM CT of lumbar spine with degenerative disease.  UA with significant signs of UTI.  CBC with mild leukocytosis of 12,000 and BMP within normal limits. Speaking with husband and family he currently is not able to care for her at  this time because she will not even get out of bed and is wetting the bed multiple times a night. She is also been more confused. Will admit for weakness, confusion and UTI. Patient started on Rocephin.   Blanchie Dessert, MD 05/15/16 [phone]

## 2016-05-16 DIAGNOSIS — R410 Disorientation, unspecified: Secondary | ICD-10-CM | POA: Diagnosis not present

## 2016-05-16 DIAGNOSIS — M069 Rheumatoid arthritis, unspecified: Secondary | ICD-10-CM | POA: Diagnosis not present

## 2016-05-16 DIAGNOSIS — R2681 Unsteadiness on feet: Secondary | ICD-10-CM | POA: Diagnosis not present

## 2016-05-16 DIAGNOSIS — Z87891 Personal history of nicotine dependence: Secondary | ICD-10-CM | POA: Diagnosis not present

## 2016-05-16 DIAGNOSIS — M5416 Radiculopathy, lumbar region: Secondary | ICD-10-CM | POA: Diagnosis present

## 2016-05-16 DIAGNOSIS — B9620 Unspecified Escherichia coli [E. coli] as the cause of diseases classified elsewhere: Secondary | ICD-10-CM | POA: Diagnosis present

## 2016-05-16 DIAGNOSIS — G309 Alzheimer's disease, unspecified: Secondary | ICD-10-CM | POA: Diagnosis not present

## 2016-05-16 DIAGNOSIS — Z8249 Family history of ischemic heart disease and other diseases of the circulatory system: Secondary | ICD-10-CM | POA: Diagnosis not present

## 2016-05-16 DIAGNOSIS — E785 Hyperlipidemia, unspecified: Secondary | ICD-10-CM | POA: Diagnosis present

## 2016-05-16 DIAGNOSIS — R279 Unspecified lack of coordination: Secondary | ICD-10-CM | POA: Diagnosis not present

## 2016-05-16 DIAGNOSIS — N390 Urinary tract infection, site not specified: Secondary | ICD-10-CM | POA: Diagnosis not present

## 2016-05-16 DIAGNOSIS — Z833 Family history of diabetes mellitus: Secondary | ICD-10-CM | POA: Diagnosis not present

## 2016-05-16 DIAGNOSIS — M419 Scoliosis, unspecified: Secondary | ICD-10-CM | POA: Diagnosis not present

## 2016-05-16 DIAGNOSIS — F419 Anxiety disorder, unspecified: Secondary | ICD-10-CM | POA: Diagnosis present

## 2016-05-16 DIAGNOSIS — J449 Chronic obstructive pulmonary disease, unspecified: Secondary | ICD-10-CM | POA: Diagnosis not present

## 2016-05-16 DIAGNOSIS — M25552 Pain in left hip: Secondary | ICD-10-CM | POA: Diagnosis not present

## 2016-05-16 DIAGNOSIS — F0390 Unspecified dementia without behavioral disturbance: Secondary | ICD-10-CM | POA: Diagnosis present

## 2016-05-16 DIAGNOSIS — M79606 Pain in leg, unspecified: Secondary | ICD-10-CM | POA: Diagnosis not present

## 2016-05-16 DIAGNOSIS — H409 Unspecified glaucoma: Secondary | ICD-10-CM | POA: Diagnosis present

## 2016-05-16 DIAGNOSIS — R41841 Cognitive communication deficit: Secondary | ICD-10-CM | POA: Diagnosis not present

## 2016-05-16 DIAGNOSIS — K219 Gastro-esophageal reflux disease without esophagitis: Secondary | ICD-10-CM | POA: Diagnosis present

## 2016-05-16 DIAGNOSIS — M1612 Unilateral primary osteoarthritis, left hip: Secondary | ICD-10-CM | POA: Diagnosis present

## 2016-05-16 DIAGNOSIS — M545 Low back pain: Secondary | ICD-10-CM | POA: Diagnosis not present

## 2016-05-16 DIAGNOSIS — R269 Unspecified abnormalities of gait and mobility: Secondary | ICD-10-CM | POA: Diagnosis not present

## 2016-05-16 DIAGNOSIS — E0822 Diabetes mellitus due to underlying condition with diabetic chronic kidney disease: Secondary | ICD-10-CM | POA: Diagnosis not present

## 2016-05-16 DIAGNOSIS — I951 Orthostatic hypotension: Secondary | ICD-10-CM | POA: Diagnosis not present

## 2016-05-16 DIAGNOSIS — M549 Dorsalgia, unspecified: Secondary | ICD-10-CM | POA: Diagnosis not present

## 2016-05-16 DIAGNOSIS — M1990 Unspecified osteoarthritis, unspecified site: Secondary | ICD-10-CM | POA: Diagnosis not present

## 2016-05-16 DIAGNOSIS — M25551 Pain in right hip: Secondary | ICD-10-CM | POA: Diagnosis not present

## 2016-05-16 DIAGNOSIS — I10 Essential (primary) hypertension: Secondary | ICD-10-CM | POA: Diagnosis present

## 2016-05-16 DIAGNOSIS — R4182 Altered mental status, unspecified: Secondary | ICD-10-CM | POA: Diagnosis not present

## 2016-05-16 DIAGNOSIS — M6281 Muscle weakness (generalized): Secondary | ICD-10-CM | POA: Diagnosis not present

## 2016-05-16 DIAGNOSIS — G8929 Other chronic pain: Secondary | ICD-10-CM | POA: Diagnosis present

## 2016-05-16 DIAGNOSIS — R278 Other lack of coordination: Secondary | ICD-10-CM | POA: Diagnosis not present

## 2016-05-16 LAB — CBC
HCT: 35.7 % — ABNORMAL LOW (ref 36.0–46.0)
Hemoglobin: 12.4 g/dL (ref 12.0–15.0)
MCH: 33.4 pg (ref 26.0–34.0)
MCHC: 34.7 g/dL (ref 30.0–36.0)
MCV: 96.2 fL (ref 78.0–100.0)
PLATELETS: 326 10*3/uL (ref 150–400)
RBC: 3.71 MIL/uL — ABNORMAL LOW (ref 3.87–5.11)
RDW: 14.1 % (ref 11.5–15.5)
WBC: 12.1 10*3/uL — ABNORMAL HIGH (ref 4.0–10.5)

## 2016-05-16 LAB — BASIC METABOLIC PANEL
Anion gap: 6 (ref 5–15)
BUN: 12 mg/dL (ref 6–20)
CHLORIDE: 106 mmol/L (ref 101–111)
CO2: 27 mmol/L (ref 22–32)
CREATININE: 0.67 mg/dL (ref 0.44–1.00)
Calcium: 8.9 mg/dL (ref 8.9–10.3)
GFR calc Af Amer: >60 mL/min (ref >60)
GFR calc non Af Amer: >60 mL/min (ref >60)
GLUCOSE: 108 mg/dL — AB (ref 65–99)
Potassium: 3.9 mmol/L (ref 3.5–5.1)
SODIUM: 139 mmol/L (ref 135–145)

## 2016-05-16 MED ORDER — HYDRALAZINE HCL 20 MG/ML IJ SOLN
10.0000 mg | Freq: Four times a day (QID) | INTRAMUSCULAR | Status: DC | PRN
  Administered 2016-05-16: 10 mg via INTRAVENOUS
  Filled 2016-05-16: qty 1

## 2016-05-16 MED ORDER — IBUPROFEN 200 MG PO TABS
600.0000 mg | ORAL_TABLET | Freq: Four times a day (QID) | ORAL | Status: DC
  Administered 2016-05-16 (×4): 600 mg via ORAL
  Filled 2016-05-16 (×4): qty 3

## 2016-05-16 NOTE — Progress Notes (Signed)
[facility]  PROGRESS NOTE  Tara Neal [id number] DOB: 08-09-1941 DOA: 05/15/2016 PCP: [name], MD    Brief HPI:  75 yo female with history of HTN, OA, chronic back pain who came with cc of L.hip pain for the past few weeks with progressive weakness. The pain is constant and radiating to the sole of the left foot laterally with occasional numbness/tingling. She has not been able to stand up or go to the bathroom on her own. She also has chronic lower back pain. No incontinence except when she can't make it to the bathroom. No saddle anesthesia. She also has had frequency w/o dysuria with confusion over the past couple of days. 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 No fever/chills. No dyspnea/cough/N/V/D/C/abd pain.   Active Problems:   UTI (lower urinary tract infection)   Assessment/Plan: 1. UTI- patient empirically started on ceftriaxone. Urine culture is pending. 2. 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 Continue Vicodin when necessary. Patient not a surgical candidate. Will obtain physical therapy consultation. 3. Altered mental status- resolved, likely from UTI. Will observe.    DVT prophylaxis: Lovenox Code Status: Full code Family Communication: No family at bedside Disposition Plan:    Consultants:  None   Procedures:  None   Antibiotics:  Ceftriaxone   Subjective: Complains of left hip pain. CT of the hip shows no fracture and shows degenerative disease in the lumbar spine.   Objective: Filed Vitals:   05/15/16 2009 05/16/16 0537 05/16/16 1134 05/16/16 1319  BP: 184/92 192/99 147/72 142/77  Pulse: 69 65 71 70  Temp:  97.6 F (36.4 C)  98 F (36.7 C)  TempSrc:  Oral  Oral  Resp:  16  18  Height:      Weight:      SpO2:  100%  99%    Intake/Output Summary (Last 24 hours) at 05/16/16 1506 Last data filed at 05/16/16 1300  Gross per 24 hour  Intake 1198.75 ml  Output   2350 ml   Net -1151.25 ml   Filed Weights   05/15/16 2002  Weight: 58 kg (127 lb 13.9 oz)    Examination:  General exam: Appears calm and comfortable  Respiratory system: Clear to auscultation. Respiratory effort normal. Cardiovascular system: S1 & S2 heard, RRR. No JVD, murmurs, rubs, gallops or clicks. No pedal edema. Gastrointestinal system: Abdomen is nondistended, soft and nontender. No organomegaly or masses felt. Normal bowel sounds heard. Central nervous system: Alert and oriented. No focal neurological deficits. Extremities: Symmetric 5 x 5 power. Skin: No rashes, lesions or ulcers Psychiatry: Judgement and insight appear normal. Mood & affect appropriate.    Data Reviewed: I have personally reviewed following labs and imaging studies Basic Metabolic Panel:  Recent Labs Lab 05/15/16 1639 05/16/16 0327  NA 134* 139  K 3.8 3.9  CL 101 106  CO2 24 27  GLUCOSE 136* 108*  BUN 15 12  CREATININE 0.69 0.67  CALCIUM 9.4 8.9   Liver Function Tests: No results for input(s): AST, ALT, ALKPHOS, BILITOT, PROT, ALBUMIN in the last 168 hours. No results for input(s): LIPASE, AMYLASE in the last 168 hours. No results for input(s): AMMONIA in the last 168 hours. CBC:  Recent Labs Lab 05/15/16 1639 05/16/16 0327  WBC 11.9* 12.1*  NEUTROABS 10.2*  --   HGB 13.5 12.4  HCT 39.3 35.7*  MCV 97.8 96.2  PLT 375 326   Cardiac Enzymes:  No results for input(s): CKTOTAL, CKMB, CKMBINDEX, TROPONINI in the last 168 hours. BNP (last 3 results) No results for input(s): BNP in the last 8760 hours.   Studies: Ct Lumbar Spine Wo Contrast  05/15/2016  CLINICAL DATA:  patient comes from home where she lives with her husband. Patient c/o left hip pain which started 2 weeks ago. Pain radiates in to left leg. Patient was seen here and evaluated for same at beginning of June. LBP EXAM: CT LUMBAR SPINE WITHOUT CONTRAST TECHNIQUE: Multidetector CT imaging of the lumbar spine was performed without  intravenous contrast administration. Multiplanar CT image reconstructions were also generated. COMPARISON:  Lumbar spine radiographs, 06/23/2015 and lumbar MRI, 05/09/2015. FINDINGS: No acute fracture. 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 There is a slight retrolisthesis of L1 on L2 and L2 on L3 is with a mild anterolisthesis of L5 on S1. This is also stable from the prior lumbar MRI. There is marked loss of disc height with endplate sclerosis and endplate cystic change at T12-L1, L1-L2, L2-L3 and L3-L4. The L4-L5 disc is relatively well preserved. There is moderate loss disc height along the left aspect of the L5-S1 disc. The bones are diffusely demineralized. T12-L1: No disc herniation. No significant central stenosis. Lateral recesses are well preserved. Moderate left and mild right facet degenerative change. Mild left neural foraminal narrowing. L1-L2: Mild disc bulging. Moderate right and mild left facet degenerative change. Central spinal canal and lateral recesses are well preserved. No significant neural foraminal narrowing. L2-L3: Diffuse spondylitis bulging. Moderate facet degenerative change. Mild central stenosis with the AP diameter of the canal narrowed to 1 cm. Moderate right lateral recess narrowing. Moderate right neural foraminal narrowing. L3-L4: Diffuse spondylotic disc bulging. Central spinal stenosis, mild-to-moderate, AP diameter narrowed to 8 mm. The with moderate bilateral facet degenerative change, greater on the right. Lateral recesses are relatively well preserved. Mild right neural foraminal narrowing. L4-L5: Diffuse spondylotic disc bulging. Advanced facet degenerative change. Moderate central spinal canal narrowing with AP diameter narrowed to 7 mm. Moderate left and mild right lateral recess narrowing. Mild neural foraminal narrowing. L5-S1: Advanced facet degenerative change. The diffuse disc bulging. Central spinal canal is relatively well  preserved. Moderate right and moderate to severe left lateral recess narrowing. Mild right and severe left neural foraminal narrowing. Soft tissues are unremarkable. IMPRESSION: 1. No acute fracture or acute finding. 2. Extensive degenerative changes as detailed above without significant change when compared to the prior lumbar MRI. No disc herniation. Electronically Signed   By: Lajean Manes M.D.   On: 05/15/2016 16:53    Scheduled Meds: . atenolol  50 mg Oral Daily  . calcium carbonate  1,250 mg Oral BID WC  . cefTRIAXone (ROCEPHIN)  IV  1 g Intravenous Q24H  . enoxaparin (LOVENOX) injection  40 mg Subcutaneous QHS  . ibuprofen  600 mg Oral QID  . latanoprost  1 drop Both Eyes QHS  . loratadine  10 mg Oral Daily  . pantoprazole  40 mg Oral Daily  . simvastatin  40 mg Oral q1800  . timolol  1 drop Both Eyes Daily   Continuous Infusions:      Time spent: 25 min    [facility] Pager [phone]. If 7PM-7AM, please contact night-coverage at [email or web address], Office  [phone]  password [email or web address] 05/16/2016, 3:06 PM  LOS: 1 day

## 2016-05-16 NOTE — Progress Notes (Signed)
Pt transferred to chair with assist of 2. Did very well, no complaints of pain when transferring. Sat up for about an hour, then requested to go back to bed.Family in the room most of the day.

## 2016-05-16 NOTE — Care Management Note (Signed)
Case Management Note  Patient Details  Name: Tara Neal MRN: [id number] Date of Birth: March 12, 1941  Subjective/Objective:    UTI                Action/Plan: Discharge Planning: AVS reviewed: PCP- Biagio Borg MD  NCM spoke to husband and son-Tara Neal, dtr-Tara Neal at bedside. States pt lives in home with husband. PT recommending SNF for rehab. Prefers [facility]. Contacted CSW with new referral.   Expected Discharge Date:  05/19/2016             Expected Discharge Plan:  Skilled Nursing Facility  In-House Referral:  Clinical Social Work  Discharge planning Services  CM Consult  Post Acute Care Choice:  NA Choice offered to:  NA  DME Arranged:  N/A DME Agency:  NA  HH Arranged:  NA HH Agency:  NA  Status of Service:  In process, will continue to follow  Medicare Important Message Given:    Date Medicare IM Given:    Medicare IM give by:    Date Additional Medicare IM Given:    Additional Medicare Important Message give by:     If discussed at [place] of Stay Meetings, dates discussed:    Additional Comments:  [name], RN 05/16/2016, 4:42 PM

## 2016-05-16 NOTE — Evaluation (Signed)
Physical Therapy Evaluation Patient Details Name: Tara Neal MRN: [id number] DOB: [birth year] Today's Date: 05/16/2016   History of Present Illness  Patient is a 75 yo female with history of HTN, OA, back surgeries & chronic back pain, R hip bursa/release who came with cc of L hip pain for the past few weeks with progressive weakness.  She has not been able to stand up or go to the bathroom on her own.   Clinical Impression  Pt admitted with above diagnosis. Pt currently with functional limitations due to the deficits listed below (see PT Problem List). * Pt will benefit from skilled PT to increase their independence and safety with mobility to allow discharge to the venue listed below.     Pt  Could certainly benefit from SNF given recent decline and incr burden of care (on her husband who has had multiple back surgeries and likely cannot continue to give the level care pt has been requiring); currently the pt requires heavy mod assist for sit to stand and cannot maintain standing, even with support for more than a second  Recommend [place] --pt has issues with pain and dimished pain control per family; it also appears this family is struggling with pt and care that she needs with the husband bearing the brunt of it as he is even tearful during PT eval;  There is lots of discussion from the family about what the pt cannot do, and why, and how movement makes things worse;  Family is supportive but would be appreciative of any further guidance determining the best course for all concerned;     Follow Up Recommendations SNF;Home health PT (depending on progress)    Equipment Recommendations  None recommended by PT    Recommendations for Other Services       Precautions / Restrictions Precautions Precautions: Fall Restrictions Weight Bearing Restrictions: No      Mobility  Bed Mobility Overal bed mobility: Needs Assistance Bed Mobility: Supine to Sit;Sit to  Supine;Rolling Rolling: Supervision (rolls to L side for incr comfort)   Supine to sit: Min assist Sit to supine: Min guard   General bed mobility comments: incr time, assist with trunk to come to sit, incr time to return to supinr with cues for positioning  Transfers Overall transfer level: Needs assistance Equipment used: 2 person hand held assist Transfers: Sit to/from Stand Sit to Stand: Mod assist         General transfer comment: assist to rise and control descent, pt is unable to extend L knee in standing or WB on L d/t pain  Ambulation/Gait             General Gait Details: unable d/t pain  Stairs            Wheelchair Mobility    Modified Rankin (Stroke Patients Only)       Balance Overall balance assessment: Needs assistance;History of Falls   Sitting balance-Leahy Scale: Fair     Standing balance support: Bilateral upper extremity supported Standing balance-Leahy Scale: Poor Standing balance comment: pt is heavily reliant on UEs and unable to maintain standing d/t pain               High Level Balance Comments: multiple falls at home resulting in injuries in past             Pertinent Vitals/Pain Pain Assessment: Faces Faces Pain Scale: Hurts whole lot Pain Location: L hip Pain Descriptors / Indicators: Sore Pain  Intervention(s): Limited activity within patient's tolerance;Monitored during session    [place] expects to be discharged to:: Unsure Living Arrangements: Spouse/significant other Available Help at Discharge: Family Type of Home: House         Home Equipment: Gilford Rile - 2 wheels;Wheelchair - power Additional Comments: pt husband has had multiple back surgeries     Prior Function Level of Independence: Needs assistance   Gait / Transfers Assistance Needed: family describes heavy assistance with stand pivot transfers for the past few weeks, assistance with RW and gait prior to that  ADL's /  Homemaking Assistance Needed: husband provides assistance wtih bathing and dressing  Comments: hx taken from family; pt has apparently had overall significant funcitonal decline over the course of the past year or so     Hand Dominance        Extremity/Trunk Assessment   Upper Extremity Assessment: Defer to OT evaluation;LUE deficits/detail;RUE deficits/detail RUE Deficits / Details: grossly WFL for activities tested     LUE Deficits / Details: wrist fx x2; pt resists full ROM shoulder;    Lower Extremity Assessment: RLE deficits/detail;LLE deficits/detail RLE Deficits / Details: at least 3+/5;  AROM WFL LLE Deficits / Details: AROM WFL; hip 2+ to 3/5, limited by pain     Communication   Communication: No difficulties  Cognition Arousal/Alertness: Awake/alert Behavior During Therapy: Flat affect Overall Cognitive Status: History of cognitive impairments - at baseline                      General Comments      Exercises        Assessment/Plan    PT Assessment Patient needs continued PT services  PT Diagnosis Difficulty walking;Generalized weakness;Acute pain   PT Problem List Decreased strength;Decreased activity tolerance;Decreased balance;Decreased mobility;Decreased knowledge of use of DME;Pain  PT Treatment Interventions DME instruction;Gait training;Functional mobility training;Therapeutic activities;Therapeutic exercise;Patient/family education   PT Goals (Current goals can be found in the Care Plan section) Acute Rehab PT Goals Patient Stated Goal: none stated; family would like to decr burden of care on husband PT Goal Formulation: With family Time For Goal Achievement: 05/30/16 Potential to Achieve Goals: Fair    Frequency Min 3X/week   Barriers to discharge        Co-evaluation               End of Session Equipment Utilized During Treatment: Gait belt Activity Tolerance: Patient limited by pain Patient left: in bed;with call  bell/phone within reach;with bed alarm set;with family/visitor present      Functional Assessment Tool Used: clinicla judgement Functional Limitation: Changing and maintaining body position Changing and Maintaining Body Position Current Status [id number]): At least 40 percent but less than 60 percent impaired, limited or restricted Changing and Maintaining Body Position Goal Status [id number]): At least 20 percent but less than 40 percent impaired, limited or restricted    Time: 1415-1439 PT Time Calculation (min) (ACUTE ONLY): 24 min   Charges:         PT G Codes:   PT G-Codes **NOT FOR INPATIENT CLASS** Functional Assessment Tool Used: clinicla judgement Functional Limitation: Changing and maintaining body position Changing and Maintaining Body Position Current Status [id number]): At least 40 percent but less than 60 percent impaired, limited or restricted Changing and Maintaining Body Position Goal Status [id number]): At least 20 percent but less than 40 percent impaired, limited or restricted    [facility] 05/16/2016, 2:58 PM

## 2016-05-17 DIAGNOSIS — M25551 Pain in right hip: Secondary | ICD-10-CM

## 2016-05-17 LAB — CBC
HCT: 38.7 % (ref 36.0–46.0)
HEMOGLOBIN: 13.3 g/dL (ref 12.0–15.0)
MCH: 33.7 pg (ref 26.0–34.0)
MCHC: 34.4 g/dL (ref 30.0–36.0)
MCV: 98 fL (ref 78.0–100.0)
Platelets: 319 10*3/uL (ref 150–400)
RBC: 3.95 MIL/uL (ref 3.87–5.11)
RDW: 14.3 % (ref 11.5–15.5)
WBC: 10.4 10*3/uL (ref 4.0–10.5)

## 2016-05-17 LAB — BASIC METABOLIC PANEL
Anion gap: 6 (ref 5–15)
BUN: 13 mg/dL (ref 6–20)
CHLORIDE: 103 mmol/L (ref 101–111)
CO2: 25 mmol/L (ref 22–32)
CREATININE: 0.78 mg/dL (ref 0.44–1.00)
Calcium: 9.1 mg/dL (ref 8.9–10.3)
GFR calc Af Amer: >60 mL/min (ref >60)
GFR calc non Af Amer: >60 mL/min (ref >60)
GLUCOSE: 119 mg/dL — AB (ref 65–99)
POTASSIUM: 3.6 mmol/L (ref 3.5–5.1)
SODIUM: 134 mmol/L — AB (ref 135–145)

## 2016-05-17 MED ORDER — FENTANYL 25 MCG/HR TD PT72
25.0000 ug | MEDICATED_PATCH | TRANSDERMAL | Status: DC
  Administered 2016-05-17: 25 ug via TRANSDERMAL
  Filled 2016-05-17: qty 1

## 2016-05-17 MED ORDER — DOCUSATE SODIUM 100 MG PO CAPS
200.0000 mg | ORAL_CAPSULE | Freq: Every day | ORAL | Status: DC
  Administered 2016-05-17 – 2016-05-18 (×2): 200 mg via ORAL
  Filled 2016-05-17 (×2): qty 2

## 2016-05-17 MED ORDER — HYDRALAZINE HCL 25 MG PO TABS: 25.0000 mg | ORAL_TABLET | Freq: Four times a day (QID) | ORAL | Status: DC | PRN

## 2016-05-17 MED ORDER — POLYETHYLENE GLYCOL 3350 17 G PO PACK
17.0000 g | PACK | Freq: Every day | ORAL | Status: DC | PRN
Start: 2016-05-17 — End: 2016-05-19
  Administered 2016-05-18 (×2): 17 g via ORAL
  Filled 2016-05-17: qty 1

## 2016-05-17 NOTE — NC FL2 (Signed)
[place] LEVEL OF CARE SCREENING TOOL     IDENTIFICATION  Patient Name: Tara Neal Birthdate: 1941-07-31 Sex: female Admission Date (Current Location): 05/15/2016  [facility] and [place] Number:  Herbalist and Address:  [facility],  [place] [address] [place]      Provider Number: [id number]  Attending Physician Name and Address:  Oswald Hillock, MD  Relative Name and Phone Number:       Current Level of Care: Hospital Recommended Level of Care: [place] Prior Approval Number:    Date Approved/Denied:   PASRR Number:   [id number]  Discharge Plan: SNF    Current Diagnoses: Patient Active Problem List   Diagnosis Date Noted  . UTI (lower urinary tract infection) 05/15/2016  . Bilateral hand pain 06/05/2015  . Gait disorder 04/15/2015  . Orthostatic hypotension 04/10/2015  . Recurrent falls while walking 04/10/2015  . Memory loss 04/10/2015  . Abnormal x-ray 02/20/2015  . Cough 11/18/2014  . Thrush, oral 04/04/2014  . Umbilical hernia 0000000  . Benign skin lesion 07/24/2013  . Anemia, unspecified 12/21/2012  . Chronic insomnia 04/22/2012  . Right hip pain 12/13/2011  . Right knee pain 12/13/2011  . Preventative health care 12/11/2011  . DIVERTICULOSIS, COLON 11/18/2010  . ARTERIOVENOUS MALFORMATION, COLON 11/18/2010  . ANXIETY 12/02/2009  . COPD (chronic obstructive pulmonary disease) ([place]) 05/27/2009  . Diabetes ([place]) 02/06/2009  . FATIGUE 12/12/2008  . Hyperlipidemia 10/01/2007  . OBESITY, MILD 10/01/2007  . MIGRAINE HEADACHE 10/01/2007  . Essential hypertension 10/01/2007  . 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 Pneumothorax 10/01/2007  . GERD 10/01/2007  . Osteoarthritis 10/01/2007  . LOW BACK PAIN 10/01/2007  . OSTEOPOROSIS 10/01/2007    Orientation RESPIRATION BLADDER Height & Weight     Self, Time, Situation, Place    Continent Weight: 127 lb 13.9 oz (58  kg) Height:  5\' 4"  (162.6 cm)  BEHAVIORAL SYMPTOMS/MOOD NEUROLOGICAL BOWEL NUTRITION STATUS      Continent Diet (Heart Healthy)  AMBULATORY STATUS COMMUNICATION OF NEEDS Skin   Limited Assist Verbally                         Personal Care Assistance Level of Assistance  Bathing, Dressing Bathing Assistance: Limited assistance   Dressing Assistance: Limited assistance     Functional Limitations Info             SPECIAL CARE FACTORS FREQUENCY  PT (By licensed PT), OT (By licensed OT)     PT Frequency: 5 OT Frequency: 5            Contractures      Additional Factors Info  Code Status, Allergies Code Status Info: Full Code Allergies Info: Allergies:  Codeine, Sulfonamide Derivatives, Penicillins, Percocet           Current Medications (05/17/2016):  This is the current hospital active medication list Current Facility-Administered Medications  Medication Dose Route Frequency Provider Last Rate Last Dose  . atenolol (TENORMIN) tablet 50 mg  50 mg Oral Daily Gennaro Africa, MD   50 mg at 05/17/16 0958  . calcium carbonate (OS-CAL - dosed in mg of elemental calcium) tablet 1,250 mg  1,250 mg Oral BID WC Gennaro Africa, MD   1,250 mg at 05/17/16 0958  . cefTRIAXone (ROCEPHIN) 1 g in dextrose 5 % 50 mL IVPB  1 g Intravenous Q24H Berton Mount, RPH   1 g at  05/16/16 1534  . enoxaparin (LOVENOX) injection 40 mg  40 mg Subcutaneous QHS Gennaro Africa, MD   40 mg at 05/16/16 2040  . fentaNYL (DURAGESIC - dosed mcg/hr) patch 25 mcg  25 mcg Transdermal Q72H Oswald Hillock, MD   25 mcg at 05/17/16 1338  . hydrALAZINE (APRESOLINE) injection 10 mg  10 mg Intravenous Q6H PRN Dianne Dun, NP   10 mg at 05/16/16 0649  . HYDROcodone-acetaminophen (NORCO) 7.5-325 MG per tablet 1-2 tablet  1-2 tablet Oral Q6H PRN Gennaro Africa, MD   2 tablet at 05/17/16 1556  . latanoprost (XALATAN) 0.005 % ophthalmic solution 1 drop  1 drop Both Eyes QHS Gennaro Africa, MD   1 drop at 05/16/16 2040   . loratadine (CLARITIN) tablet 10 mg  10 mg Oral Daily Gennaro Africa, MD   10 mg at 05/17/16 0958  . pantoprazole (PROTONIX) EC tablet 40 mg  40 mg Oral Daily Gennaro Africa, MD   40 mg at 05/17/16 0958  . simvastatin (ZOCOR) tablet 40 mg  40 mg Oral q1800 Gennaro Africa, MD   40 mg at 05/16/16 1802  . timolol (TIMOPTIC) 0.5 % ophthalmic solution 1 drop  1 drop Both Eyes Daily Gennaro Africa, MD   1 drop at 05/16/16 1136     Discharge Medications: Please see discharge summary for a list of discharge medications.  Relevant Imaging Results:  Relevant Lab Results:   Additional Information [id number]  Standley Brooking, LCSW

## 2016-05-17 NOTE — Clinical Social Work Note (Signed)
Clinical Social Work Assessment  Patient Details  Name: Tara Neal MRN: [id number] Date of Birth: 10/02/1941  Date of referral:  05/17/16               Reason for consult:  Facility Placement                Permission sought to share information with:  Facility Art therapist granted to share information::  Yes, Verbal Permission Granted  Name::        Agency::     Relationship::     Contact Information:     Housing/Transportation Living arrangements for the past 2 months:  Single Family Home Source of Information:  Patient Patient Interpreter Needed:  None Criminal Activity/Legal Involvement Pertinent to Current Situation/Hospitalization:  No - Comment as needed Significant Relationships:    Lives with:  Self Do you feel safe going back to the place where you live?    Need for family participation in patient care:  Yes (Comment)  Care giving concerns:  CSW received consult to speak with patient about discharge plans. Patient did not have any family members at bedside but was alert and oriented.    Social Worker assessment / plan:  CSW spoke with patient regarding discharge plans. Patient was unaware of any facilities in the area; however, her family spoke with the case manager and preferred [facility]. Patient agreed and was understanding to the decision.   Employment status:  Retired Health visitor, Managed Care PT Recommendations:  [place] / Referral to community resources:  [place]  Patient/Family's Response to care:  Patient seemed understanding of going to a SNF and did not have many questions to ask. Patient and family had already discussed her options before CSW arrived. Patient agreed that receiving short-term rehab would be a good option.   Patient/Family's Understanding of and Emotional Response to Diagnosis, Current Treatment, and Prognosis:  Patient was understanding to  going to SNF and had already discussed options with her family members. She seemed calm and open to the transition into rehab.   Emotional Assessment Appearance:  Appears stated age Attitude/Demeanor/Rapport:    Affect (typically observed):  Calm, Accepting, Pleasant Orientation:  Oriented to Self, Oriented to Place, Oriented to  Time, Oriented to Situation Alcohol / Substance use:    Psych involvement (Current and /or in the community):  No (Comment)  Discharge Needs  Concerns to be addressed:  Discharge Planning Concerns Readmission within the last 30 days:  No Current discharge risk:  None Barriers to Discharge:  No Barriers Identified   Weston Anna, LCSW 05/17/2016, 3:48 PM

## 2016-05-17 NOTE — Progress Notes (Signed)
Foley catheter was removed at 11:00, patient has not voiided, denies bladder discomforts and urge to void, bladder scan done vol=230, will observe and encourage to do trial  voiding.

## 2016-05-17 NOTE — Evaluation (Signed)
Clinical/Bedside Swallow Evaluation Patient Details  Name: Tara Neal MRN: [id number] Date of Birth: [birth year]  Today's Date: 05/17/2016 Time: SLP Start Time (ACUTE ONLY): 1154 SLP Stop Time (ACUTE ONLY): 1210 SLP Time Calculation (min) (ACUTE ONLY): 16 min  Past Medical History:  Past Medical History  Diagnosis Date  . Hyperlipidemia   . Hypertension   . Mild obesity   . Spontaneous pneumothorax     1968  . GERD (gastroesophageal reflux disease)   . Osteoarthritis   . Low back pain   . Osteoporosis   . Hormone replacement therapy (postmenopausal)   . Anxiety   . Osteopenia   . COPD (chronic obstructive pulmonary disease) ([place])   . Diverticulosis of colon   . Shortness of breath     with exertion   . Anemia     hx of   . Chronic insomnia 04/22/2012  . Scoliosis   . Pneumonia     hx of 5 years ago   . Migraine headache     denies  . Umbilical hernia [id number]  . Glaucoma   . Allergy    Past Surgical History:  Past Surgical History  Procedure Laterality Date  . Tonsillectomy    . Lumbar disc surgery       x 2  . Other surgical history      benign growth removed from right under ear at age 22   . Excision/release bursa hip  04/26/2012    Procedure: EXCISION/RELEASE BURSA HIP;  Surgeon: Gearlean Alf, MD;  Location: [facility]S;  Service: Orthopedics;  Laterality: Right;  . Hip adductor tenotomy    . Eye surgery      - bil cataract surg and Retina surgery to left eye  . Tonsillectomy    . Hernia repair  [id number]    umbilical hernia  . Shoulder arthroscopy      left shoulder  . Wrist fracture surgery      plate in left wrist/ fractured 2 times   HPI:  75 yo female with history of HTN, OA, chronic back pain who came with cc of L.hip pain for the past few weeks with progressive weakness. The pain is constant and radiating to the sole of the left foot laterally with occasional numbness/tingling. She has not been able to stand up or go to the bathroom on her own. She  also has chronic lower back pain. No incontinence except when she can't make it to the bathroom. 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 Found to have UTI. Lest CXR on 6/6 shows no acute disease. Family reports frequent choking, not while she is eating or drinking.    Assessment / Plan / Recommendation Clinical Impression  Pt demonstrates normal swallow function; she was observed to eat a toasted sandwich and drink 6 oz of water consecutively with a straw with no signs of aspiration. Discussed possibility of reflux/allergy related coughing with family, which they felt could be likely. SLP discussed basic aspiration precautions, esophageal precautions and demonstrated better positioning in the bed with meals. Also suggested pills whole in puree or pudding as pt struggles to manipulate pills nad liquid occasionally with choking. No SLP f/u needed, pt to continue current diet.     Aspiration Risk  Mild aspiration risk    Diet Recommendation Regular;Thin liquid   Liquid Administration via: Cup;Straw Medication Administration: Whole meds with puree Supervision: Patient able to self feed;Intermittent supervision to cue for compensatory strategies Compensations: Slow rate;Small sips/bites Postural Changes: Seated upright at 90  degrees;Remain upright for at least 30 minutes after po intake    Other  Recommendations Oral Care Recommendations: Oral care BID   Follow up Recommendations  None    Frequency and Duration            Prognosis        Swallow Study   General HPI: 75 yo female with history of HTN, OA, chronic back pain who came with cc of L.hip pain for the past few weeks with progressive weakness. The pain is constant and radiating to the sole of the left foot laterally with occasional numbness/tingling. She has not been able to stand up or go to the bathroom on her own. She also has chronic lower back pain. No incontinence except when she can't make it to the bathroom. 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  Found to have UTI. Lest CXR on 6/6 shows no acute disease. Family reports frequent choking, not while she is eating or drinking.  Type of Study: Bedside Swallow Evaluation Previous Swallow Assessment: none Diet Prior to this Study: Regular;NPO Temperature Spikes Noted: No Respiratory Status: Room air History of Recent Intubation: No Behavior/Cognition: Alert;Cooperative;Pleasant mood Oral Cavity Assessment: Within Functional Limits Oral Care Completed by SLP: No Oral Cavity - Dentition: Adequate natural dentition Vision: Functional for self-feeding Self-Feeding Abilities: Able to feed self Patient Positioning: Upright in bed Baseline Vocal Quality: Normal Volitional Cough: Strong Volitional Swallow: Able to elicit    Oral/Motor/Sensory Function Overall Oral Motor/Sensory Function: Within functional limits   Ice Chips     Thin Liquid Thin Liquid: Within functional limits Presentation: Straw;Self Fed    Nectar Thick Nectar Thick Liquid: Not tested   Honey Thick Honey Thick Liquid: Not tested   Puree Puree: Within functional limits   Solid   GO   Solid: Within functional limits Presentation: Self [name], MA CCC-SLP [phone]  Lynann Beaver 05/17/2016,12:47 PM

## 2016-05-17 NOTE — Progress Notes (Signed)
[facility]  PROGRESS NOTE  Tara GIARRAPUTO [id number] DOB: [birth year] DOA: 05/15/2016 PCP: [name], MD    Brief HPI:  75 yo female with history of HTN, OA, chronic back pain who came with cc of L.hip pain for the past few weeks with progressive weakness. The pain is constant and radiating to the sole of the left foot laterally with occasional numbness/tingling. She has not been able to stand up or go to the bathroom on her own. She also has chronic lower back pain. No incontinence except when she can't make it to the bathroom. No saddle anesthesia. She also has had frequency w/o dysuria with confusion over the past couple of days. 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 No fever/chills. No dyspnea/cough/N/V/D/C/abd pain.   Active Problems:   Right hip pain   UTI (lower urinary tract infection)   Assessment/Plan: 1. UTI- patient empirically started on ceftriaxone. Urine culture is growing Escherichia coli greater than 100,000 colonies per mL. Will await final sensitivities. 2. 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 Continue Vicodin when necessary. Patient not a surgical candidate. PT OT evaluation. Start fentanyl patch 25 g every 72 hours.  3. Altered mental status- resolved, likely from UTI. Will observe.    DVT prophylaxis: Lovenox Code Status: Full code Family Communication: No family at bedside Disposition Plan: Discussed with patient's daughter and husband at bedside, patient would benefit from going to rehabilitation.   Consultants:  None   Procedures:  None   Antibiotics:  Ceftriaxone   Subjective: Patient seen and examined, still complains of generalized pain. 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    Objective: Filed Vitals:   05/16/16 1319 05/16/16 2030 05/17/16 0442 05/17/16 1307  BP: 142/77 162/78 174/94 141/81   Pulse: 70 69 73 68  Temp: 98 F (36.7 C) 98.5 F (36.9 C) 98.2 F (36.8 C) 98 F (36.7 C)  TempSrc: Oral Oral Oral Axillary  Resp: 18 16 16 16   Height:      Weight:      SpO2: 99% 98% 100% 99%    Intake/Output Summary (Last 24 hours) at 05/17/16 1554 Last data filed at 05/17/16 1300  Gross per 24 hour  Intake    720 ml  Output   1300 ml  Net   -580 ml   Filed Weights   05/15/16 2002  Weight: 58 kg (127 lb 13.9 oz)    Examination:  General exam: Appears calm and comfortable  Respiratory system: Clear to auscultation. Respiratory effort normal. Cardiovascular system: S1 & S2 heard, RRR. No JVD, murmurs, rubs, gallops or clicks. No pedal edema. Gastrointestinal system: Abdomen is nondistended, soft and nontender. No organomegaly or masses felt. Normal bowel sounds heard. Central nervous system: Alert and oriented. No focal neurological deficits. Extremities: Symmetric 5 x 5 power. Skin: No rashes, lesions or ulcers Psychiatry: Judgement and insight appear normal. Mood & affect appropriate.    Data Reviewed: I have personally reviewed following labs and imaging studies Basic Metabolic Panel:  Recent Labs Lab 05/15/16 1639 05/16/16 0327 05/17/16 0406  NA 134* 139 134*  K 3.8 3.9 3.6  CL 101 106 103  CO2 24 27 25   GLUCOSE 136* 108* 119*  BUN 15 12 13   CREATININE 0.69 0.67 0.78  CALCIUM 9.4 8.9 9.1   Liver Function Tests: No results for  input(s): AST, ALT, ALKPHOS, BILITOT, PROT, ALBUMIN in the last 168 hours. No results for input(s): LIPASE, AMYLASE in the last 168 hours. No results for input(s): AMMONIA in the last 168 hours. CBC:  Recent Labs Lab 05/15/16 1639 05/16/16 0327 05/17/16 0406  WBC 11.9* 12.1* 10.4  NEUTROABS 10.2*  --   --   HGB 13.5 12.4 13.3  HCT 39.3 35.7* 38.7  MCV 97.8 96.2 98.0  PLT 375 326 319   Cardiac Enzymes: No results for input(s): CKTOTAL, CKMB, CKMBINDEX, TROPONINI in the last 168 hours. BNP (last 3 results) No results  for input(s): BNP in the last 8760 hours.   Studies: Ct Lumbar Spine Wo Contrast  05/15/2016  CLINICAL DATA:  patient comes from home where she lives with her husband. Patient c/o left hip pain which started 2 weeks ago. Pain radiates in to left leg. Patient was seen here and evaluated for same at beginning of June. LBP EXAM: CT LUMBAR SPINE WITHOUT CONTRAST TECHNIQUE: Multidetector CT imaging of the lumbar spine was performed without intravenous contrast administration. Multiplanar CT image reconstructions were also generated. COMPARISON:  Lumbar spine radiographs, 06/23/2015 and lumbar MRI, 05/09/2015. FINDINGS: No acute fracture. 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 There is a slight retrolisthesis of L1 on L2 and L2 on L3 is with a mild anterolisthesis of L5 on S1. This is also stable from the prior lumbar MRI. There is marked loss of disc height with endplate sclerosis and endplate cystic change at T12-L1, L1-L2, L2-L3 and L3-L4. The L4-L5 disc is relatively well preserved. There is moderate loss disc height along the left aspect of the L5-S1 disc. The bones are diffusely demineralized. T12-L1: No disc herniation. No significant central stenosis. Lateral recesses are well preserved. Moderate left and mild right facet degenerative change. Mild left neural foraminal narrowing. L1-L2: Mild disc bulging. Moderate right and mild left facet degenerative change. Central spinal canal and lateral recesses are well preserved. No significant neural foraminal narrowing. L2-L3: Diffuse spondylitis bulging. Moderate facet degenerative change. Mild central stenosis with the AP diameter of the canal narrowed to 1 cm. Moderate right lateral recess narrowing. Moderate right neural foraminal narrowing. L3-L4: Diffuse spondylotic disc bulging. Central spinal stenosis, mild-to-moderate, AP diameter narrowed to 8 mm. The with moderate bilateral facet degenerative change, greater on the  right. Lateral recesses are relatively well preserved. Mild right neural foraminal narrowing. L4-L5: Diffuse spondylotic disc bulging. Advanced facet degenerative change. Moderate central spinal canal narrowing with AP diameter narrowed to 7 mm. Moderate left and mild right lateral recess narrowing. Mild neural foraminal narrowing. L5-S1: Advanced facet degenerative change. The diffuse disc bulging. Central spinal canal is relatively well preserved. Moderate right and moderate to severe left lateral recess narrowing. Mild right and severe left neural foraminal narrowing. Soft tissues are unremarkable. IMPRESSION: 1. No acute fracture or acute finding. 2. Extensive degenerative changes as detailed above without significant change when compared to the prior lumbar MRI. No disc herniation. Electronically Signed   By: Lajean Manes M.D.   On: 05/15/2016 16:53    Scheduled Meds: . atenolol  50 mg Oral Daily  . calcium carbonate  1,250 mg Oral BID WC  . cefTRIAXone (ROCEPHIN)  IV  1 g Intravenous Q24H  . enoxaparin (LOVENOX) injection  40 mg Subcutaneous QHS  . fentaNYL  25 mcg Transdermal Q72H  . latanoprost  1 drop Both Eyes QHS  . loratadine  10 mg Oral Daily  . pantoprazole  40 mg Oral Daily  . simvastatin  40 mg Oral q1800  . timolol  1 drop Both Eyes Daily   Continuous Infusions:      Time spent: 25 min    [facility] Pager [phone]. If 7PM-7AM, please contact night-coverage at [email or web address], Office  [phone]  password [email or web address] 05/17/2016, 3:54 PM  LOS: 2 days

## 2016-05-17 NOTE — Clinical Social Work Placement (Signed)
   CLINICAL SOCIAL WORK PLACEMENT  NOTE  Date:  05/17/2016  Patient Details  Name: Tara Neal MRN: [id number] Date of Birth: 1941/08/19  Clinical Social Work is seeking post-discharge placement for this patient at the [place] level of care (*CSW will initial, date and re-position this form in  chart as items are completed):  Yes   Patient/family provided with [facility] Work Department's list of facilities offering this level of care within the geographic area requested by the patient (or if unable, by the patient's family).  Yes   Patient/family informed of their freedom to choose among providers that offer the needed level of care, that participate in Medicare, Medicaid or managed care program needed by the patient, have an available bed and are willing to accept the patient.  Yes   Patient/family informed of [facility]'s ownership interest in [facility] and [facility], as well as of the fact that they are under no obligation to receive care at these facilities.  PASRR submitted to EDS on       PASRR number received on       Existing PASRR number confirmed on 05/17/16     FL2 transmitted to all facilities in geographic area requested by pt/family on 05/17/16     FL2 transmitted to all facilities within larger geographic area on       Patient informed that his/her managed care company has contracts with or will negotiate with certain facilities, including the following:            Patient/family informed of bed offers received.  Patient chooses bed at       Physician recommends and patient chooses bed at      Patient to be transferred to   on  .  Patient to be transferred to facility by       Patient family notified on   of transfer.  Name of family member notified:        PHYSICIAN       Additional Comment:    _______________________________________________ Weston Anna, LCSW 05/17/2016, 4:30 PM

## 2016-05-18 LAB — URINE CULTURE

## 2016-05-18 NOTE — Progress Notes (Signed)
[facility]  PROGRESS NOTE  Tara Neal [id number] DOB: January 10, 1941 DOA: 05/15/2016 PCP: [name], MD    Brief HPI:  75 yo female with history of HTN, OA, chronic back pain who came with cc of L.hip pain for the past few weeks with progressive weakness. The pain is constant and radiating to the sole of the left foot laterally with occasional numbness/tingling. She has not been able to stand up or go to the bathroom on her own. She also has chronic lower back pain. No incontinence except when she can't make it to the bathroom. No saddle anesthesia. She also has had frequency w/o dysuria with confusion over the past couple of days. 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 No fever/chills. No dyspnea/cough/N/V/D/C/abd pain.  Patient started on fentanyl patch and can be discharged on fentanyl to SNF when bed available.  Active Problems:   Right hip pain   UTI (lower urinary tract infection)   Assessment/Plan: 1. UTI- patient empirically started on ceftriaxone. Urine culture is growing Escherichia coli greater than 100,000 colonies per mL. Will await final sensitivities. Patient can be discharged on Keflex. 2. 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 Continue Vicodin when necessary. Patient not a surgical candidate. PT OT evaluation. Started fentanyl patch 25 g every 72 hours.  3. Altered mental status- resolved, likely from UTI. Will observe.    DVT prophylaxis: Lovenox Code Status: Full code Family Communication: No family at bedside Disposition Plan: Discussed with patient's daughter and husband at bedside, patient would benefit from going to rehabilitation. Possible discharge in 1-2 days.  Consultants:  None   Procedures:  None   Antibiotics:  Ceftriaxone   Subjective: Patient seen and examined, eels better this morning.   Objective: Filed Vitals:   05/17/16 1307 05/17/16 2201 05/18/16 0614 05/18/16 0702  BP: 141/81 148/87 178/110 150/60   Pulse: 68 67 76 76  Temp: 98 F (36.7 C) 98.4 F (36.9 C) 98.5 F (36.9 C)   TempSrc: Axillary Oral Oral   Resp: 16 16 16    Height:      Weight:      SpO2: 99% 98% 99%     Intake/Output Summary (Last 24 hours) at 05/18/16 1300 Last data filed at 05/18/16 1030  Gross per 24 hour  Intake    120 ml  Output    900 ml  Net   -780 ml   Filed Weights   05/15/16 2002  Weight: 58 kg (127 lb 13.9 oz)    Examination:  General exam: Appears calm and comfortable  Respiratory system: Clear to auscultation. Respiratory effort normal. Cardiovascular system: S1 & S2 heard, RRR. No JVD, murmurs, rubs, gallops or clicks. No pedal edema. Gastrointestinal system: Abdomen is nondistended, soft and nontender. No organomegaly or masses felt. Normal bowel sounds heard. Central nervous system: Alert and oriented. No focal neurological deficits. Extremities: Symmetric 5 x 5 power. Skin: No rashes, lesions or ulcers Psychiatry: Judgement and insight appear normal. Mood & affect appropriate.    Data Reviewed: I have personally reviewed following labs and imaging studies Basic Metabolic Panel:  Recent Labs Lab 05/15/16 1639 05/16/16 0327 05/17/16 0406  NA 134* 139 134*  K 3.8 3.9 3.6  CL 101 106 103  CO2 24 27 25   GLUCOSE 136* 108* 119*  BUN 15 12 13   CREATININE 0.69 0.67 0.78  CALCIUM 9.4 8.9 9.1   Liver Function Tests: No results for input(s): AST, ALT, ALKPHOS, BILITOT, PROT, ALBUMIN in  the last 168 hours. No results for input(s): LIPASE, AMYLASE in the last 168 hours. No results for input(s): AMMONIA in the last 168 hours. CBC:  Recent Labs Lab 05/15/16 1639 05/16/16 0327 05/17/16 0406  WBC 11.9* 12.1* 10.4  NEUTROABS 10.2*  --   --   HGB 13.5 12.4 13.3  HCT 39.3 35.7* 38.7  MCV 97.8 96.2 98.0  PLT 375 326 319   Cardiac Enzymes: No results for input(s): CKTOTAL, CKMB, CKMBINDEX, TROPONINI in the last 168 hours. BNP (last 3 results) No results for input(s): BNP in the  last 8760 hours.   Studies: No results found.  Scheduled Meds: . atenolol  50 mg Oral Daily  . calcium carbonate  1,250 mg Oral BID WC  . cefTRIAXone (ROCEPHIN)  IV  1 g Intravenous Q24H  . docusate sodium  200 mg Oral QHS  . enoxaparin (LOVENOX) injection  40 mg Subcutaneous QHS  . fentaNYL  25 mcg Transdermal Q72H  . latanoprost  1 drop Both Eyes QHS  . loratadine  10 mg Oral Daily  . pantoprazole  40 mg Oral Daily  . simvastatin  40 mg Oral q1800  . timolol  1 drop Both Eyes Daily   Continuous Infusions:      Time spent: 25 min    [facility] Pager [phone]. If 7PM-7AM, please contact night-coverage at [email or web address], Office  [phone]  password [email or web address] 05/18/2016, 1:00 PM  LOS: 3 days

## 2016-05-18 NOTE — Care Management Important Message (Signed)
Important Message  Patient Details  Name: ELIANIS MATTAS MRN: [id number] Date of Birth: 30-Jan-1941   Medicare Important Message Given:  Yes    Sh[place]on, [place] 05/18/2016, 9:02 AMImportant Message  Patient Details  Name: TAMEA MCCLEAF MRN: [id number] Date of Birth: October 30, 1941   Medicare Important Message Given:  Yes    Josabet, [place] 05/18/2016, 9:02 AM

## 2016-05-18 NOTE — Progress Notes (Signed)
Patient still having urinary retention, in and out catheter done and obtained 550 ml of clear amber urine.patient tolerated well.

## 2016-05-18 NOTE — Progress Notes (Signed)
Patient still having urinary retention, tried to use bedside commode several times but unsuccessful. In and out catheterization done, obtained 450 ml of clear amber urine.Patient tolerated procedure well.

## 2016-05-19 DIAGNOSIS — M4806 Spinal stenosis, lumbar region: Secondary | ICD-10-CM | POA: Diagnosis not present

## 2016-05-19 DIAGNOSIS — B9620 Unspecified Escherichia coli [E. coli] as the cause of diseases classified elsewhere: Secondary | ICD-10-CM | POA: Diagnosis not present

## 2016-05-19 DIAGNOSIS — M79606 Pain in leg, unspecified: Secondary | ICD-10-CM | POA: Diagnosis not present

## 2016-05-19 DIAGNOSIS — J449 Chronic obstructive pulmonary disease, unspecified: Secondary | ICD-10-CM | POA: Diagnosis not present

## 2016-05-19 DIAGNOSIS — K5730 Diverticulosis of large intestine without perforation or abscess without bleeding: Secondary | ICD-10-CM | POA: Diagnosis not present

## 2016-05-19 DIAGNOSIS — M4856XA Collapsed vertebra, not elsewhere classified, lumbar region, initial encounter for fracture: Secondary | ICD-10-CM | POA: Diagnosis not present

## 2016-05-19 DIAGNOSIS — M069 Rheumatoid arthritis, unspecified: Secondary | ICD-10-CM | POA: Diagnosis not present

## 2016-05-19 DIAGNOSIS — Z6822 Body mass index (BMI) 22.0-22.9, adult: Secondary | ICD-10-CM | POA: Diagnosis not present

## 2016-05-19 DIAGNOSIS — K219 Gastro-esophageal reflux disease without esophagitis: Secondary | ICD-10-CM | POA: Diagnosis not present

## 2016-05-19 DIAGNOSIS — R278 Other lack of coordination: Secondary | ICD-10-CM | POA: Diagnosis not present

## 2016-05-19 DIAGNOSIS — F5104 Psychophysiologic insomnia: Secondary | ICD-10-CM | POA: Diagnosis not present

## 2016-05-19 DIAGNOSIS — R269 Unspecified abnormalities of gait and mobility: Secondary | ICD-10-CM | POA: Diagnosis not present

## 2016-05-19 DIAGNOSIS — S32059A Unspecified fracture of fifth lumbar vertebra, initial encounter for closed fracture: Secondary | ICD-10-CM | POA: Diagnosis not present

## 2016-05-19 DIAGNOSIS — M549 Dorsalgia, unspecified: Secondary | ICD-10-CM | POA: Diagnosis not present

## 2016-05-19 DIAGNOSIS — M419 Scoliosis, unspecified: Secondary | ICD-10-CM | POA: Diagnosis not present

## 2016-05-19 DIAGNOSIS — N390 Urinary tract infection, site not specified: Principal | ICD-10-CM

## 2016-05-19 DIAGNOSIS — D649 Anemia, unspecified: Secondary | ICD-10-CM | POA: Diagnosis not present

## 2016-05-19 DIAGNOSIS — M25552 Pain in left hip: Secondary | ICD-10-CM | POA: Diagnosis not present

## 2016-05-19 DIAGNOSIS — G309 Alzheimer's disease, unspecified: Secondary | ICD-10-CM | POA: Diagnosis not present

## 2016-05-19 DIAGNOSIS — E0822 Diabetes mellitus due to underlying condition with diabetic chronic kidney disease: Secondary | ICD-10-CM | POA: Diagnosis not present

## 2016-05-19 DIAGNOSIS — F419 Anxiety disorder, unspecified: Secondary | ICD-10-CM | POA: Diagnosis not present

## 2016-05-19 DIAGNOSIS — M25539 Pain in unspecified wrist: Secondary | ICD-10-CM | POA: Diagnosis not present

## 2016-05-19 DIAGNOSIS — Z880 Allergy status to penicillin: Secondary | ICD-10-CM | POA: Diagnosis not present

## 2016-05-19 DIAGNOSIS — R279 Unspecified lack of coordination: Secondary | ICD-10-CM | POA: Diagnosis not present

## 2016-05-19 DIAGNOSIS — R2689 Other abnormalities of gait and mobility: Secondary | ICD-10-CM | POA: Diagnosis not present

## 2016-05-19 DIAGNOSIS — M6281 Muscle weakness (generalized): Secondary | ICD-10-CM | POA: Diagnosis not present

## 2016-05-19 DIAGNOSIS — Z8249 Family history of ischemic heart disease and other diseases of the circulatory system: Secondary | ICD-10-CM | POA: Diagnosis not present

## 2016-05-19 DIAGNOSIS — R531 Weakness: Secondary | ICD-10-CM | POA: Diagnosis not present

## 2016-05-19 DIAGNOSIS — Z882 Allergy status to sulfonamides status: Secondary | ICD-10-CM | POA: Diagnosis not present

## 2016-05-19 DIAGNOSIS — R4182 Altered mental status, unspecified: Secondary | ICD-10-CM | POA: Diagnosis not present

## 2016-05-19 DIAGNOSIS — M79605 Pain in left leg: Secondary | ICD-10-CM | POA: Diagnosis not present

## 2016-05-19 DIAGNOSIS — R270 Ataxia, unspecified: Secondary | ICD-10-CM | POA: Diagnosis not present

## 2016-05-19 DIAGNOSIS — E669 Obesity, unspecified: Secondary | ICD-10-CM | POA: Diagnosis not present

## 2016-05-19 DIAGNOSIS — Z833 Family history of diabetes mellitus: Secondary | ICD-10-CM | POA: Diagnosis not present

## 2016-05-19 DIAGNOSIS — R41841 Cognitive communication deficit: Secondary | ICD-10-CM | POA: Diagnosis not present

## 2016-05-19 DIAGNOSIS — I10 Essential (primary) hypertension: Secondary | ICD-10-CM | POA: Diagnosis not present

## 2016-05-19 DIAGNOSIS — R2681 Unsteadiness on feet: Secondary | ICD-10-CM | POA: Diagnosis not present

## 2016-05-19 DIAGNOSIS — M1990 Unspecified osteoarthritis, unspecified site: Secondary | ICD-10-CM | POA: Diagnosis not present

## 2016-05-19 DIAGNOSIS — E785 Hyperlipidemia, unspecified: Secondary | ICD-10-CM | POA: Diagnosis not present

## 2016-05-19 DIAGNOSIS — I951 Orthostatic hypotension: Secondary | ICD-10-CM | POA: Diagnosis not present

## 2016-05-19 DIAGNOSIS — H409 Unspecified glaucoma: Secondary | ICD-10-CM | POA: Diagnosis not present

## 2016-05-19 DIAGNOSIS — W19XXXA Unspecified fall, initial encounter: Secondary | ICD-10-CM | POA: Diagnosis not present

## 2016-05-19 DIAGNOSIS — M545 Low back pain: Secondary | ICD-10-CM | POA: Diagnosis not present

## 2016-05-19 DIAGNOSIS — M5136 Other intervertebral disc degeneration, lumbar region: Secondary | ICD-10-CM | POA: Diagnosis not present

## 2016-05-19 MED ORDER — CEFDINIR 300 MG PO CAPS: 300.0000 mg | ORAL_CAPSULE | Freq: Two times a day (BID) | ORAL | Status: DC

## 2016-05-19 MED ORDER — METHOTREXATE SODIUM CHEMO INJECTION 250 MG/10ML: 25.0000 mg | INTRAMUSCULAR | Status: DC

## 2016-05-19 NOTE — Progress Notes (Signed)
CSW assisting with d/c planning. Pt has a bed at [facility] ( [facility] when pt is stable for d/c.   Werner Lean LCSW [phone]

## 2016-05-19 NOTE — Progress Notes (Signed)
Report called to [facility] at [facility].  Patient stable from AM assessment.  Patient will be transported via [facility] to facility.

## 2016-05-19 NOTE — Clinical Social Work Placement (Signed)
   CLINICAL SOCIAL WORK PLACEMENT  NOTE  Date:  05/19/2016  Patient Details  Name: Tara Neal MRN: [id number] Date of Birth: 08-18-1941  Clinical Social Work is seeking post-discharge placement for this patient at the [place] level of care (*CSW will initial, date and re-position this form in  chart as items are completed):  Yes   Patient/family provided with [facility] Work Department's list of facilities offering this level of care within the geographic area requested by the patient (or if unable, by the patient's family).  Yes   Patient/family informed of their freedom to choose among providers that offer the needed level of care, that participate in Medicare, Medicaid or managed care program needed by the patient, have an available bed and are willing to accept the patient.  Yes   Patient/family informed of [facility]'s ownership interest in [facility] and [facility], as well as of the fact that they are under no obligation to receive care at these facilities.  PASRR submitted to EDS on       PASRR number received on       Existing PASRR number confirmed on 05/17/16     FL2 transmitted to all facilities in geographic area requested by pt/family on 05/17/16     FL2 transmitted to all facilities within larger geographic area on       Patient informed that his/her managed care company has contracts with or will negotiate with certain facilities, including the following:        Yes   Patient/family informed of bed offers received.  Patient chooses bed at [facility], [place]     Physician recommends and patient chooses bed at      Patient to be transferred to [facility] on 05/19/16.  Patient to be transferred to facility by [facility]     Patient family notified on 05/19/16 of transfer.  Name of family member notified:  Spouse / son     PHYSICIAN       Additional Comment: Pt / family bare in agreement with d/c to  [facility] ( [facility] ) today. [facility] transport required. Medical necessity form completed. Pt / spouse is aware out of pocket costs may be associated with [facility] transport. D/C Summary sent to SNF for review. Scripts included in d/c packet. # for report provided to [facility].   _______________________________________________ Luretha Rued, LCSW 05/19/2016, 2:09 PM

## 2016-05-19 NOTE — Progress Notes (Signed)
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 In and out catheterization done, obtained 425 ml. Patient tolerated well. Will continue to monitor.  Also bed was wet, patient cleaned and linen changed.

## 2016-05-19 NOTE — Discharge Summary (Signed)
Physician Discharge Summary  Tara Neal [id number] DOB: [birth year] DOA: 05/15/2016  PCP: [name], MD  Admit date: 05/15/2016 Discharge date: 05/19/2016  Time spent: > 35 minutes  Recommendations for Outpatient Follow-up:  1. Monitor and decide whether or not to continue antibiotic therapy 2. Pt has had urinary retention. Will d/c antihistamine. Please consider referral to urology if problem persists   Discharge Diagnoses:  Active Problems:   Right hip pain   UTI (lower urinary tract infection)   Discharge Condition: stable  Diet recommendation: hear healthy  Filed Weights   05/15/16 2002  Weight: 58 kg (127 lb 13.9 oz)    History of present illness:  75 yo female with history of HTN, OA, chronic back pain who came with cc of L.hip pain for the past few weeks with progressive weakness. The pain is constant and radiating to the sole of the left foot laterally with occasional numbness/tingling. She has not been able to stand up or go to the bathroom on her own. She also has chronic lower back pain. No incontinence except when she can't make it to the bathroom. No saddle anesthesia. She also has had frequency w/o dysuria with confusion over the past couple of days. 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 No fever/chills. No dyspnea/cough/N/V/D/C/abd pain.  Patient started on fentanyl patch and can be discharged on fentanyl to SNF when bed available.  Hospital Course:  1. UTI- patient empirically started on ceftriaxone. Urine culture is growing Escherichia coli greater than 100,000 colonies per mL.  2. 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 Will d/c on acetaminophen. May consider increasing pain medication regimen at facility 3. Altered mental status- resolved, likely from UTI. Improving will d/c antihistamine  Procedures:  None  Consultations:  None  Discharge Exam: Filed Vitals:   05/18/16 2148 05/19/16 0601  BP: 145/72 137/75  Pulse: 100 66   Temp: 99 F (37.2 C) 98.6 F (37 C)  Resp: 18 18    General: Pt in nad, alert and awake Cardiovascular: rrr, no rubs Respiratory: no increased wob, no wheezes  Discharge Instructions   Discharge Instructions    Call MD for:  severe uncontrolled pain    Complete by:  As directed      Call MD for:  temperature >100.4    Complete by:  As directed      Diet - low sodium heart healthy    Complete by:  As directed      Discharge instructions    Complete by:  As directed   Please follow up with urologist after hospital discharge for further evaluation and recommendations.     Increase activity slowly    Complete by:  As directed           Current Discharge Medication List    START taking these medications   Details  cefdinir (OMNICEF) 300 MG capsule Take 1 capsule (300 mg total) by mouth 2 (two) times daily. Qty: 6 capsule, Refills: 0      CONTINUE these medications which have CHANGED   Details  methotrexate 250 MG/10ML injection Inject 1 mL (25 mg total) into the skin once a week. Qty: 10 mL      CONTINUE these medications which have NOT CHANGED   Details  acetaminophen (TYLENOL) 325 MG tablet Take 650 mg by mouth at bedtime.    alendronate (FOSAMAX) 70 MG tablet TAKE 1 TABLET EVERY 7 DAYS (ON SATURDAYS). TAKE WITH A FULL GLASS OF  WATER ON AN EMPTY STOMACH Qty: 12 tablet, Refills: 3    Ascorbic Acid (VITAMIN C) 1000 MG tablet Take 1,000 mg by mouth daily.    atenolol (TENORMIN) 50 MG tablet TAKE 1 TABLET EVERY DAY Qty: 90 tablet, Refills: 3    benazepril (LOTENSIN) 40 MG tablet TAKE 1 TABLET EVERY DAY Qty: 90 tablet, Refills: 3    calcium carbonate (OS-CAL) 600 MG TABS Take 600 mg by mouth 2 (two) times daily with a meal.    Cholecalciferol (VITAMIN D3) 1000 UNITS CAPS Take 1 capsule by mouth daily.    FOLIC ACID PO Take 1 mg by mouth daily.     furosemide (LASIX) 40 MG tablet TAKE 1 TABLET EVERY DAY WITH BREAKFAST Qty: 90 tablet, Refills: 3     latanoprost (XALATAN) 0.005 % ophthalmic solution Place 1 drop into both eyes at bedtime.     MELATONIN PO Take 2 tablets by mouth at bedtime.     Multiple Vitamin (MULTIVITAMIN) tablet Take 1 tablet by mouth daily.    Omega-3 Fatty Acids (FISH OIL) 1200 MG CAPS Take by mouth. Take 2 a day    omeprazole (PRILOSEC) 20 MG capsule TAKE 2 CAPSULES EVERY DAY Qty: 180 capsule, Refills: 3    simvastatin (ZOCOR) 40 MG tablet TAKE 1 TABLET EVERY DAY  AT  6PM Qty: 90 tablet, Refills: 3    timolol (TIMOPTIC) 0.5 % ophthalmic solution Place 1 drop into both eyes daily.    venlafaxine XR (EFFEXOR-XR) 150 MG 24 hr capsule TAKE 1 CAPSULE EVERY DAY WITH BREAKFAST Qty: 90 capsule, Refills: 3    diclofenac sodium (VOLTAREN) 1 % GEL Apply 4 g topically 4 (four) times daily as needed. Qty: 100 g, Refills: 5      STOP taking these medications     cetirizine (ZYRTEC) 10 MG tablet      traMADol (ULTRAM) 50 MG tablet      benzonatate (TESSALON PERLES) 100 MG capsule      doxycycline (VIBRA-TABS) 100 MG tablet      HYDROcodone-homatropine (HYCODAN) 5-1.5 MG/5ML syrup      levofloxacin (LEVAQUIN) 250 MG tablet      meloxicam (MOBIC) 7.5 MG tablet      Methotrexate, PF, 10 MG/0.2ML SOAJ      predniSONE (DELTASONE) 10 MG tablet        Allergies  Allergen Reactions  . Codeine Nausea Only  . Sulfonamide Derivatives Nausea Only    REACTION: Nausea  . Penicillins Rash    .Marland KitchenHas patient had a PCN reaction causing immediate rash, facial/tongue/throat swelling, SOB or lightheadedness with hypotension: No Has patient had a PCN reaction causing severe rash involving mucus membranes or skin necrosis: No Has patient had a PCN reaction that required hospitalization No Has patient had a PCN reaction occurring within the last 10 years: No If all of the above answers are "NO", then may proceed with Cephalosporin use.   Marland Kitchen Percocet [Oxycodone-Acetaminophen] Itching   Follow-up Information    Follow  up with [facility] .   Specialty:  [place] information:   [place] [phone]       The results of significant diagnostics from this hospitalization (including imaging, microbiology, ancillary and laboratory) are listed below for reference.    Significant Diagnostic Studies: Dg Chest 2 View  05/04/2016  CLINICAL DATA:  Fall last night EXAM: CHEST  2 VIEW COMPARISON:  02/24/2016 FINDINGS: Scattered scarring in  the right lung is stable. Lungs are otherwise clear. Normal heart size. No pneumothorax or pleural effusion. IMPRESSION: No active cardiopulmonary disease. Electronically Signed   By: Marybelle Killings M.D.   On: 05/04/2016 13:02   Ct Lumbar Spine Wo Contrast  05/15/2016  CLINICAL DATA:  patient comes from home where she lives with her husband. Patient c/o left hip pain which started 2 weeks ago. Pain radiates in to left leg. Patient was seen here and evaluated for same at beginning of June. LBP EXAM: CT LUMBAR SPINE WITHOUT CONTRAST TECHNIQUE: Multidetector CT imaging of the lumbar spine was performed without intravenous contrast administration. Multiplanar CT image reconstructions were also generated. COMPARISON:  Lumbar spine radiographs, 06/23/2015 and lumbar MRI, 05/09/2015. FINDINGS: No acute fracture. 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 There is a slight retrolisthesis of L1 on L2 and L2 on L3 is with a mild anterolisthesis of L5 on S1. This is also stable from the prior lumbar MRI. There is marked loss of disc height with endplate sclerosis and endplate cystic change at T12-L1, L1-L2, L2-L3 and L3-L4. The L4-L5 disc is relatively well preserved. There is moderate loss disc height along the left aspect of the L5-S1 disc. The bones are diffusely demineralized. T12-L1: No disc herniation. No significant central stenosis. Lateral recesses are well preserved.  Moderate left and mild right facet degenerative change. Mild left neural foraminal narrowing. L1-L2: Mild disc bulging. Moderate right and mild left facet degenerative change. Central spinal canal and lateral recesses are well preserved. No significant neural foraminal narrowing. L2-L3: Diffuse spondylitis bulging. Moderate facet degenerative change. Mild central stenosis with the AP diameter of the canal narrowed to 1 cm. Moderate right lateral recess narrowing. Moderate right neural foraminal narrowing. L3-L4: Diffuse spondylotic disc bulging. Central spinal stenosis, mild-to-moderate, AP diameter narrowed to 8 mm. The with moderate bilateral facet degenerative change, greater on the right. Lateral recesses are relatively well preserved. Mild right neural foraminal narrowing. L4-L5: Diffuse spondylotic disc bulging. Advanced facet degenerative change. Moderate central spinal canal narrowing with AP diameter narrowed to 7 mm. Moderate left and mild right lateral recess narrowing. Mild neural foraminal narrowing. L5-S1: Advanced facet degenerative change. The diffuse disc bulging. Central spinal canal is relatively well preserved. Moderate right and moderate to severe left lateral recess narrowing. Mild right and severe left neural foraminal narrowing. Soft tissues are unremarkable. IMPRESSION: 1. No acute fracture or acute finding. 2. Extensive degenerative changes as detailed above without significant change when compared to the prior lumbar MRI. No disc herniation. Electronically Signed   By: Lajean Manes M.D.   On: 05/15/2016 16:53   Dg Hip Unilat With Pelvis 2-3 Views Left  05/04/2016  CLINICAL DATA:  Pain following fall EXAM: DG HIP (WITH OR WITHOUT PELVIS) 2-3V LEFT COMPARISON:  February 20, 2015 FINDINGS: Frontal pelvis as well as frontal and lateral left hip images were obtained. There is no fracture or dislocation. The hip joint spaces appear unremarkable. There is lower lumbar levoscoliosis with  degenerative change in the lower lumbar spine. IMPRESSION: Degenerative change and scoliosis lower lumbar spine. Hip joints appear symmetric bilaterally. No acute fracture or dislocation. Electronically Signed   By: Lowella Grip III M.D.   On: 05/04/2016 13:04    Microbiology: Recent Results (from the past 240 hour(s))  Urine culture     Status: Abnormal   Collection Time: 05/15/16  7:00 PM  Result Value Ref Range Status   Specimen Description URINE, CLEAN  CATCH  Final   Special Requests NONE  Final   Culture >=100,000 COLONIES/mL ESCHERICHIA COLI (A)  Final   Report Status 05/18/2016 FINAL  Final   Organism ID, Bacteria ESCHERICHIA COLI (A)  Final      Susceptibility   Escherichia coli - MIC*    AMPICILLIN 16 INTERMEDIATE Intermediate     CEFAZOLIN <=4 SENSITIVE Sensitive     CEFTRIAXONE <=1 SENSITIVE Sensitive     CIPROFLOXACIN <=0.25 SENSITIVE Sensitive     GENTAMICIN 2 SENSITIVE Sensitive     IMIPENEM <=0.25 SENSITIVE Sensitive     NITROFURANTOIN <=16 SENSITIVE Sensitive     TRIMETH/SULFA <=20 SENSITIVE Sensitive     AMPICILLIN/SULBACTAM 8 SENSITIVE Sensitive     PIP/TAZO <=4 SENSITIVE Sensitive     * >=100,000 COLONIES/mL ESCHERICHIA COLI     Labs: Basic Metabolic Panel:  Recent Labs Lab 05/15/16 1639 05/16/16 0327 05/17/16 0406  NA 134* 139 134*  K 3.8 3.9 3.6  CL 101 106 103  CO2 24 27 25   GLUCOSE 136* 108* 119*  BUN 15 12 13   CREATININE 0.69 0.67 0.78  CALCIUM 9.4 8.9 9.1   Liver Function Tests: No results for input(s): AST, ALT, ALKPHOS, BILITOT, PROT, ALBUMIN in the last 168 hours. No results for input(s): LIPASE, AMYLASE in the last 168 hours. No results for input(s): AMMONIA in the last 168 hours. CBC:  Recent Labs Lab 05/15/16 1639 05/16/16 0327 05/17/16 0406  WBC 11.9* 12.1* 10.4  NEUTROABS 10.2*  --   --   HGB 13.5 12.4 13.3  HCT 39.3 35.7* 38.7  MCV 97.8 96.2 98.0  PLT 375 326 319   Cardiac Enzymes: No results for input(s): CKTOTAL,  CKMB, CKMBINDEX, TROPONINI in the last 168 hours. BNP: BNP (last 3 results) No results for input(s): BNP in the last 8760 hours.  ProBNP (last 3 results) No results for input(s): PROBNP in the last 8760 hours.  CBG: No results for input(s): GLUCAP in the last 168 hours.     Signed:  Velvet Bathe MD.  [facility] 05/19/2016, 12:56 PM

## 2016-05-20 ENCOUNTER — Telehealth: Payer: Self-pay | Admitting: *Deleted

## 2016-05-20 NOTE — Telephone Encounter (Signed)
Pt was on TCM list admitted for UTI & Hip Pain. Pt D/C 6/21, and sent to SNF [facility]...Tara Neal

## 2016-05-23 DIAGNOSIS — J449 Chronic obstructive pulmonary disease, unspecified: Secondary | ICD-10-CM | POA: Diagnosis not present

## 2016-05-23 DIAGNOSIS — M25552 Pain in left hip: Secondary | ICD-10-CM | POA: Diagnosis not present

## 2016-05-23 DIAGNOSIS — I10 Essential (primary) hypertension: Secondary | ICD-10-CM | POA: Diagnosis not present

## 2016-05-23 DIAGNOSIS — R2689 Other abnormalities of gait and mobility: Secondary | ICD-10-CM | POA: Diagnosis not present

## 2016-05-23 DIAGNOSIS — M069 Rheumatoid arthritis, unspecified: Secondary | ICD-10-CM | POA: Diagnosis not present

## 2016-05-23 DIAGNOSIS — N390 Urinary tract infection, site not specified: Secondary | ICD-10-CM | POA: Diagnosis not present

## 2016-05-23 DIAGNOSIS — M5136 Other intervertebral disc degeneration, lumbar region: Secondary | ICD-10-CM | POA: Diagnosis not present

## 2016-05-25 ENCOUNTER — Other Ambulatory Visit (HOSPITAL_COMMUNITY): Payer: Self-pay | Admitting: Internal Medicine

## 2016-05-25 DIAGNOSIS — R270 Ataxia, unspecified: Secondary | ICD-10-CM

## 2016-05-25 DIAGNOSIS — R29898 Other symptoms and signs involving the musculoskeletal system: Secondary | ICD-10-CM

## 2016-06-02 ENCOUNTER — Ambulatory Visit (HOSPITAL_COMMUNITY)
Admission: RE | Admit: 2016-06-02 | Discharge: 2016-06-02 | Disposition: A | Discharge status: Home / Self Care | Payer: Medicare Other | Source: Ambulatory Visit | Attending: Internal Medicine | Admitting: Internal Medicine

## 2016-06-02 DIAGNOSIS — R531 Weakness: Secondary | ICD-10-CM | POA: Insufficient documentation

## 2016-06-02 DIAGNOSIS — M4856XA Collapsed vertebra, not elsewhere classified, lumbar region, initial encounter for fracture: Secondary | ICD-10-CM | POA: Diagnosis not present

## 2016-06-02 DIAGNOSIS — M4806 Spinal stenosis, lumbar region: Secondary | ICD-10-CM | POA: Diagnosis not present

## 2016-06-02 DIAGNOSIS — R270 Ataxia, unspecified: Secondary | ICD-10-CM | POA: Insufficient documentation

## 2016-06-02 DIAGNOSIS — R29898 Other symptoms and signs involving the musculoskeletal system: Secondary | ICD-10-CM

## 2016-06-03 ENCOUNTER — Other Ambulatory Visit: Payer: Medicare Other

## 2016-06-10 ENCOUNTER — Other Ambulatory Visit (HOSPITAL_COMMUNITY): Payer: Self-pay | Admitting: Interventional Radiology

## 2016-06-10 DIAGNOSIS — IMO0002 Reserved for concepts with insufficient information to code with codable children: Secondary | ICD-10-CM

## 2016-06-17 ENCOUNTER — Ambulatory Visit (HOSPITAL_COMMUNITY)
Admission: RE | Admit: 2016-06-17 | Discharge: 2016-06-17 | Disposition: A | Discharge status: Home / Self Care | Payer: Medicare Other | Source: Ambulatory Visit | Attending: Interventional Radiology | Admitting: Interventional Radiology

## 2016-06-17 DIAGNOSIS — IMO0002 Reserved for concepts with insufficient information to code with codable children: Secondary | ICD-10-CM

## 2016-06-17 DIAGNOSIS — M545 Low back pain: Secondary | ICD-10-CM | POA: Diagnosis not present

## 2016-06-17 DIAGNOSIS — M79605 Pain in left leg: Secondary | ICD-10-CM | POA: Diagnosis not present

## 2016-06-17 HISTORY — PX: IR GENERIC HISTORICAL: IMG1180011

## 2016-06-21 ENCOUNTER — Other Ambulatory Visit (HOSPITAL_COMMUNITY): Payer: Self-pay | Admitting: Interventional Radiology

## 2016-06-22 ENCOUNTER — Encounter (HOSPITAL_COMMUNITY): Payer: Self-pay | Admitting: Interventional Radiology

## 2016-06-22 ENCOUNTER — Other Ambulatory Visit (HOSPITAL_COMMUNITY): Payer: Self-pay | Admitting: Interventional Radiology

## 2016-06-22 DIAGNOSIS — IMO0002 Reserved for concepts with insufficient information to code with codable children: Secondary | ICD-10-CM

## 2016-06-29 ENCOUNTER — Other Ambulatory Visit: Payer: Self-pay | Admitting: General Surgery

## 2016-06-30 ENCOUNTER — Other Ambulatory Visit: Payer: Self-pay | Admitting: Radiology

## 2016-07-01 ENCOUNTER — Other Ambulatory Visit (HOSPITAL_COMMUNITY): Payer: Self-pay | Admitting: Interventional Radiology

## 2016-07-01 ENCOUNTER — Ambulatory Visit (HOSPITAL_COMMUNITY)
Admission: RE | Admit: 2016-07-01 | Discharge: 2016-07-01 | Disposition: A | Discharge status: Home / Self Care | Payer: Medicare Other | Source: Ambulatory Visit | Attending: Interventional Radiology | Admitting: Interventional Radiology

## 2016-07-01 ENCOUNTER — Telehealth (HOSPITAL_COMMUNITY): Payer: Self-pay

## 2016-07-01 ENCOUNTER — Encounter (HOSPITAL_COMMUNITY): Payer: Self-pay

## 2016-07-01 DIAGNOSIS — E785 Hyperlipidemia, unspecified: Secondary | ICD-10-CM | POA: Insufficient documentation

## 2016-07-01 DIAGNOSIS — M1990 Unspecified osteoarthritis, unspecified site: Secondary | ICD-10-CM | POA: Insufficient documentation

## 2016-07-01 DIAGNOSIS — I10 Essential (primary) hypertension: Secondary | ICD-10-CM | POA: Insufficient documentation

## 2016-07-01 DIAGNOSIS — Z833 Family history of diabetes mellitus: Secondary | ICD-10-CM | POA: Insufficient documentation

## 2016-07-01 DIAGNOSIS — Z882 Allergy status to sulfonamides status: Secondary | ICD-10-CM | POA: Insufficient documentation

## 2016-07-01 DIAGNOSIS — IMO0002 Reserved for concepts with insufficient information to code with codable children: Secondary | ICD-10-CM

## 2016-07-01 DIAGNOSIS — H409 Unspecified glaucoma: Secondary | ICD-10-CM | POA: Insufficient documentation

## 2016-07-01 DIAGNOSIS — F419 Anxiety disorder, unspecified: Secondary | ICD-10-CM | POA: Insufficient documentation

## 2016-07-01 DIAGNOSIS — E669 Obesity, unspecified: Secondary | ICD-10-CM | POA: Insufficient documentation

## 2016-07-01 DIAGNOSIS — W19XXXA Unspecified fall, initial encounter: Secondary | ICD-10-CM | POA: Insufficient documentation

## 2016-07-01 DIAGNOSIS — M419 Scoliosis, unspecified: Secondary | ICD-10-CM | POA: Insufficient documentation

## 2016-07-01 DIAGNOSIS — J449 Chronic obstructive pulmonary disease, unspecified: Secondary | ICD-10-CM | POA: Insufficient documentation

## 2016-07-01 DIAGNOSIS — Z880 Allergy status to penicillin: Secondary | ICD-10-CM | POA: Insufficient documentation

## 2016-07-01 DIAGNOSIS — S32059A Unspecified fracture of fifth lumbar vertebra, initial encounter for closed fracture: Secondary | ICD-10-CM | POA: Diagnosis not present

## 2016-07-01 DIAGNOSIS — F5104 Psychophysiologic insomnia: Secondary | ICD-10-CM | POA: Insufficient documentation

## 2016-07-01 DIAGNOSIS — K5730 Diverticulosis of large intestine without perforation or abscess without bleeding: Secondary | ICD-10-CM | POA: Insufficient documentation

## 2016-07-01 DIAGNOSIS — K219 Gastro-esophageal reflux disease without esophagitis: Secondary | ICD-10-CM | POA: Insufficient documentation

## 2016-07-01 DIAGNOSIS — Z6822 Body mass index (BMI) 22.0-22.9, adult: Secondary | ICD-10-CM | POA: Insufficient documentation

## 2016-07-01 DIAGNOSIS — D649 Anemia, unspecified: Secondary | ICD-10-CM | POA: Insufficient documentation

## 2016-07-01 DIAGNOSIS — M4856XA Collapsed vertebra, not elsewhere classified, lumbar region, initial encounter for fracture: Secondary | ICD-10-CM | POA: Diagnosis not present

## 2016-07-01 DIAGNOSIS — Z8249 Family history of ischemic heart disease and other diseases of the circulatory system: Secondary | ICD-10-CM | POA: Insufficient documentation

## 2016-07-01 HISTORY — PX: IR GENERIC HISTORICAL: IMG1180011

## 2016-07-01 LAB — BASIC METABOLIC PANEL
Anion gap: 9 (ref 5–15)
BUN: 12 mg/dL (ref 6–20)
CO2: 28 mmol/L (ref 22–32)
Calcium: 9.5 mg/dL (ref 8.9–10.3)
Chloride: 100 mmol/L — ABNORMAL LOW (ref 101–111)
Creatinine, Ser: 0.9 mg/dL (ref 0.44–1.00)
GFR calc Af Amer: >60 mL/min (ref >60)
GFR calc non Af Amer: >60 mL/min (ref >60)
Glucose, Bld: 121 mg/dL — ABNORMAL HIGH (ref 65–99)
Potassium: 3.5 mmol/L (ref 3.5–5.1)
Sodium: 137 mmol/L (ref 135–145)

## 2016-07-01 LAB — CBC
HCT: 41.7 % (ref 36.0–46.0)
Hemoglobin: 13.5 g/dL (ref 12.0–15.0)
MCH: 33.3 pg (ref 26.0–34.0)
MCHC: 32.4 g/dL (ref 30.0–36.0)
MCV: 102.7 fL — AB (ref 78.0–100.0)
PLATELETS: 314 10*3/uL (ref 150–400)
RBC: 4.06 MIL/uL (ref 3.87–5.11)
RDW: 13.5 % (ref 11.5–15.5)
WBC: 6.2 10*3/uL (ref 4.0–10.5)

## 2016-07-01 LAB — APTT: aPTT: 32 seconds (ref 24–36)

## 2016-07-01 LAB — PROTIME-INR
INR: 1.08
PROTHROMBIN TIME: 14 s (ref 11.4–15.2)

## 2016-07-01 MED ORDER — SODIUM CHLORIDE 0.9 % IV SOLN: INTRAVENOUS | Status: AC

## 2016-07-01 MED ORDER — IOPAMIDOL (ISOVUE-300) INJECTION 61%
INTRAVENOUS | Status: AC
Start: 2016-07-01 — End: 2016-07-01
  Administered 2016-07-01: 5 mL
  Filled 2016-07-01: qty 50

## 2016-07-01 MED ORDER — MIDAZOLAM HCL 2 MG/2ML IJ SOLN
INTRAMUSCULAR | Status: AC | PRN
  Administered 2016-07-01: 1 mg via INTRAVENOUS

## 2016-07-01 MED ORDER — FENTANYL CITRATE (PF) 100 MCG/2ML IJ SOLN
INTRAMUSCULAR | Status: AC
  Filled 2016-07-01: qty 4

## 2016-07-01 MED ORDER — GELATIN ABSORBABLE 12-7 MM EX MISC
CUTANEOUS | Status: AC
  Filled 2016-07-01: qty 1

## 2016-07-01 MED ORDER — BUPIVACAINE HCL (PF) 0.25 % IJ SOLN
INTRAMUSCULAR | Status: AC
Start: 2016-07-01 — End: 2016-07-01
  Administered 2016-07-01: 20 mL
  Filled 2016-07-01: qty 30

## 2016-07-01 MED ORDER — SODIUM CHLORIDE 0.9 % IV SOLN: INTRAVENOUS | Status: DC

## 2016-07-01 MED ORDER — VANCOMYCIN HCL IN DEXTROSE 1-5 GM/200ML-% IV SOLN
1000.0000 mg | INTRAVENOUS | Status: AC
  Administered 2016-07-01: 1000 mg via INTRAVENOUS

## 2016-07-01 MED ORDER — SODIUM CHLORIDE 0.9 % IV SOLN
INTRAVENOUS | Status: AC | PRN
  Administered 2016-07-01: 10 mL/h via INTRAVENOUS

## 2016-07-01 MED ORDER — TOBRAMYCIN SULFATE 1.2 G IJ SOLR
INTRAMUSCULAR | Status: AC
Start: 2016-07-01 — End: 2016-07-01
  Administered 2016-07-01: 1.2 g
  Filled 2016-07-01: qty 1.2

## 2016-07-01 MED ORDER — VANCOMYCIN HCL IN DEXTROSE 1-5 GM/200ML-% IV SOLN
INTRAVENOUS | Status: AC
  Filled 2016-07-01: qty 200

## 2016-07-01 MED ORDER — HYDROMORPHONE HCL 1 MG/ML IJ SOLN
INTRAMUSCULAR | Status: AC
  Filled 2016-07-01: qty 1

## 2016-07-01 MED ORDER — MIDAZOLAM HCL 2 MG/2ML IJ SOLN
INTRAMUSCULAR | Status: AC
Start: 2016-07-01 — End: 2016-07-01
  Filled 2016-07-01: qty 4

## 2016-07-01 MED ORDER — FENTANYL CITRATE (PF) 100 MCG/2ML IJ SOLN
INTRAMUSCULAR | Status: AC | PRN
  Administered 2016-07-01: 25 ug via INTRAVENOUS

## 2016-07-01 NOTE — Discharge Instructions (Signed)
KYPHOPLASTY/VERTEBROPLASTY DISCHARGE INSTRUCTIONS  Medications: (check all that apply)     Resume all home medications as before procedure.        Continue your pain medications as prescribed as needed.  Over the next 3-5 days, decrease your pain medication as tolerated.  Over the counter medications (i.e. Tylenol, ibuprofen, and aleve) may be substituted once severe/moderate pain symptoms have subsided.   Wound Care: - Bandages may be removed the day following your procedure.  You may get your incision wet once bandages are removed.  Bandaids may be used to cover the incisions until scab formation.  Topical ointments are optional.  - If you develop a fever greater than 101 degrees, have increased skin redness at the incision sites or pus-like oozing from incisions occurring within 1 week of the procedure, contact radiology at [phone] or [phone].  - Ice pack to back for 15-20 minutes 2-3 time per day for first 2-3 days post procedure.  The ice will expedite muscle healing and help with the pain from the incisions.   Activity: - Bedrest today with limited activity for 24 hours post procedure.  - No driving for 48 hours.  - Increase your activity as tolerated after bedrest (with assistance if necessary).  - Refrain from any strenuous activity or heavy lifting (greater than 10 lbs.).   Follow up: - Contact radiology at [phone] or [phone] if any questions/concerns.  - A physician assistant from radiology will contact you in approximately 1 week.  - If a biopsy was performed at the time of your procedure, your referring physician should receive the results in usually 2-3 days.        1.No stooping,bending or lifting more than 10 lbs foer 2 weeks. 2.Use walker to ambulate  For 2 weeks.Marland Kitchen 3.RTC in 2 weeks.

## 2016-07-01 NOTE — Procedures (Signed)
S/P L5 VP 

## 2016-07-01 NOTE — Telephone Encounter (Signed)
Called to schedule, left message for pt to call back. AW 

## 2016-07-01 NOTE — H&P (Signed)
Chief Complaint: L5 compression fracture  Referring Physician: Dr. Leanna Battles  Supervising Physician: Luanne Bras  Patient Status:Out-pt  HPI: Tara Neal is an 75 y.o. female who was referred to Dr. [name] for low back pain.  She had an MRI which revealed an L5 compression fracture.  This is likely secondary to several falls that she has had over the last couple of months.  No changes in her medical history since she saw Dr. [name] a couple of weeks ago.  She presents today for a VP vs KP.  Past Medical History:  Past Medical History:  Diagnosis Date  . Allergy   . Anemia    hx of   . Anxiety   . Chronic insomnia 04/22/2012  . COPD (chronic obstructive pulmonary disease) ([place])   . Diverticulosis of colon   . GERD (gastroesophageal reflux disease)   . Glaucoma   . Hormone replacement therapy (postmenopausal)   . Hyperlipidemia   . Hypertension   . Low back pain   . Migraine headache    denies  . Mild obesity   . Osteoarthritis   . Osteopenia   . Osteoporosis   . Pneumonia    hx of 5 years ago   . Scoliosis   . Shortness of breath    with exertion   . Spontaneous pneumothorax    1968  . Umbilical hernia 3/38/3291    Past Surgical History:  Past Surgical History:  Procedure Laterality Date  . EXCISION/RELEASE BURSA HIP  04/26/2012   Procedure: EXCISION/RELEASE BURSA HIP;  Surgeon: Gearlean Alf, MD;  Location: [facility];  Service: Orthopedics;  Laterality: Right;  . EYE SURGERY     - bil cataract surg and Retina surgery to left eye  . HERNIA REPAIR  91/66/0600   umbilical hernia  . HIP ADDUCTOR TENOTOMY    . IR GENERIC HISTORICAL  06/17/2016   IR RADIOLOGIST EVAL & MGMT 06/17/2016 [facility]  . LUMBAR DISC SURGERY      x 2  . OTHER SURGICAL HISTORY     benign growth removed from right under ear at age 58   . SHOULDER ARTHROSCOPY     left shoulder  . TONSILLECTOMY    . TONSILLECTOMY    . WRIST FRACTURE SURGERY     plate in left wrist/  fractured 2 times    Family History:  Family History  Problem Relation Age of Onset  . Hyperlipidemia Mother   . Heart disease Mother   . Diabetes Mother     Social History:  reports that she quit smoking about 34 years ago. Her smoking use included Cigarettes. She quit after 20.00 years of use. She has never used smokeless tobacco. She reports that she drinks alcohol. She reports that she does not use drugs.  Allergies:  Allergies  Allergen Reactions  . Codeine Nausea Only  . Sulfonamide Derivatives Nausea Only    REACTION: Nausea  . Penicillins Rash    .Marland KitchenHas patient had a PCN reaction causing immediate rash, facial/tongue/throat swelling, SOB or lightheadedness with hypotension: No Has patient had a PCN reaction causing severe rash involving mucus membranes or skin necrosis: No Has patient had a PCN reaction that required hospitalization No Has patient had a PCN reaction occurring within the last 10 years: No If all of the above answers are "NO", then may proceed with Cephalosporin use.   Marland Kitchen Percocet [Oxycodone-Acetaminophen] Itching    Medications: Reviewed in epic  Please HPI for pertinent  positives, otherwise complete 10 system ROS negative, except some paraesthesia of her left foot, which is chronic.  Mallampati Score: MD Evaluation Airway: WNL Heart: WNL Abdomen: WNL Chest/ Lungs: WNL ASA  Classification: 3 Mallampati/Airway Score: Two  Physical Exam: BP 133/73   Pulse (!) 57   Temp 98.1 F (36.7 C) (Oral)   Resp 16   Ht _0  (1.626 m)   Wt 130 lb (59 kg)   SpO2 96%   BMI 22.31 kg/m  Body mass index is 22.31 kg/m. General: pleasant, WD, WN, elderly white female who is laying in bed in NAD HEENT: head is normocephalic, atraumatic.  Sclera are noninjected.  PERRL.  Ears and nose without any masses or lesions.  Mouth is pink and moist Heart: regular, rate, and rhythm, with frequent ectopic beats.  Normal s1,s2. No obvious murmurs, gallops, or rubs noted.   Palpable radial and pedal pulses bilaterally Lungs: CTAB, no wheezes, rhonchi, or rales noted.  Respiratory effort nonlabored Abd: soft, NT, ND, +BS, no masses, hernias, or organomegaly MS: all 4 extremities are symmetrical with no cyanosis, clubbing, or edema. Psych: A&Ox3 with an appropriate affect.   Labs: Results for orders placed or performed during the hospital encounter of 07/01/16 (from the past 48 hour(s))  APTT     Status: None   Collection Time: 07/01/16  6:25 AM  Result Value Ref Range   aPTT 32 24 - 36 seconds  Basic metabolic panel     Status: Abnormal   Collection Time: 07/01/16  6:25 AM  Result Value Ref Range   Sodium 137 135 - 145 mmol/L   Potassium 3.5 3.5 - 5.1 mmol/L   Chloride 100 (L) 101 - 111 mmol/L   CO2 28 22 - 32 mmol/L   Glucose, Bld 121 (H) 65 - 99 mg/dL   BUN 12 6 - 20 mg/dL   Creatinine, Ser 0.90 0.44 - 1.00 mg/dL   Calcium 9.5 8.9 - 10.3 mg/dL   GFR calc non Af Amer >60 >60 mL/min   GFR calc Af Amer >60 >60 mL/min    Comment: (NOTE) The eGFR has been calculated using the CKD EPI equation. This calculation has not been validated in all clinical situations. eGFR's persistently <60 mL/min signify possible Chronic Kidney Disease.    Anion gap 9 5 - 15  CBC     Status: Abnormal   Collection Time: 07/01/16  6:25 AM  Result Value Ref Range   WBC 6.2 4.0 - 10.5 K/uL   RBC 4.06 3.87 - 5.11 MIL/uL   Hemoglobin 13.5 12.0 - 15.0 g/dL   HCT 41.7 36.0 - 46.0 %   MCV 102.7 (H) 78.0 - 100.0 fL   MCH 33.3 26.0 - 34.0 pg   MCHC 32.4 30.0 - 36.0 g/dL   RDW 13.5 11.5 - 15.5 %   Platelets 314 150 - 400 K/uL  Protime-INR     Status: None   Collection Time: 07/01/16  6:25 AM  Result Value Ref Range   Prothrombin Time 14.0 11.4 - 15.2 seconds   INR 1.08     Imaging: No results found.  Assessment/Plan 1. L5 compression fracture -plan to proceed with VP vs KP today -labs and vitals have been reviewed -Risks and Benefits discussed with the patient  including, but not limited to education regarding the natural healing process of compression fractures without intervention, bleeding, infection, cement migration which may cause spinal cord damage, paralysis, pulmonary embolism or even death. All of the patient's questions  were answered, patient is agreeable to proceed. Consent signed and in chart.   Thank you for this interesting consult.  I greatly enjoyed meeting [employer] and look forward to participating in their care.  A copy of this report was sent to the requesting provider on this date.  Electronically Signed: Henreitta Cea 07/01/2016, 8:51 AM   I spent a total of    25 Minutes in face to face in clinical consultation, greater than 50% of which was counseling/coordinating care for L5 compression fracture

## 2016-07-02 ENCOUNTER — Encounter (HOSPITAL_COMMUNITY): Payer: Self-pay | Admitting: Interventional Radiology

## 2016-07-05 ENCOUNTER — Telehealth: Payer: Self-pay

## 2016-07-05 DIAGNOSIS — M25552 Pain in left hip: Secondary | ICD-10-CM | POA: Diagnosis not present

## 2016-07-05 DIAGNOSIS — M25551 Pain in right hip: Secondary | ICD-10-CM | POA: Diagnosis not present

## 2016-07-05 DIAGNOSIS — I10 Essential (primary) hypertension: Secondary | ICD-10-CM | POA: Diagnosis not present

## 2016-07-05 DIAGNOSIS — S32059D Unspecified fracture of fifth lumbar vertebra, subsequent encounter for fracture with routine healing: Secondary | ICD-10-CM | POA: Diagnosis not present

## 2016-07-05 DIAGNOSIS — F0280 Dementia in other diseases classified elsewhere without behavioral disturbance: Secondary | ICD-10-CM | POA: Diagnosis not present

## 2016-07-05 DIAGNOSIS — M069 Rheumatoid arthritis, unspecified: Secondary | ICD-10-CM | POA: Diagnosis not present

## 2016-07-05 DIAGNOSIS — J449 Chronic obstructive pulmonary disease, unspecified: Secondary | ICD-10-CM | POA: Diagnosis not present

## 2016-07-05 DIAGNOSIS — G309 Alzheimer's disease, unspecified: Secondary | ICD-10-CM | POA: Diagnosis not present

## 2016-07-05 DIAGNOSIS — Z4789 Encounter for other orthopedic aftercare: Secondary | ICD-10-CM | POA: Diagnosis not present

## 2016-07-05 NOTE — Telephone Encounter (Signed)
Verbal Orders for PT 2x a week for 6 week  Verbal Orders for CNA 2x a week for [place] [phone]

## 2016-07-06 ENCOUNTER — Other Ambulatory Visit (HOSPITAL_COMMUNITY): Payer: Self-pay | Admitting: Interventional Radiology

## 2016-07-06 ENCOUNTER — Telehealth: Payer: Self-pay | Admitting: Internal Medicine

## 2016-07-06 DIAGNOSIS — M069 Rheumatoid arthritis, unspecified: Secondary | ICD-10-CM | POA: Diagnosis not present

## 2016-07-06 DIAGNOSIS — Z4789 Encounter for other orthopedic aftercare: Secondary | ICD-10-CM | POA: Diagnosis not present

## 2016-07-06 DIAGNOSIS — IMO0002 Reserved for concepts with insufficient information to code with codable children: Secondary | ICD-10-CM

## 2016-07-06 DIAGNOSIS — M25551 Pain in right hip: Secondary | ICD-10-CM | POA: Diagnosis not present

## 2016-07-06 DIAGNOSIS — S32059D Unspecified fracture of fifth lumbar vertebra, subsequent encounter for fracture with routine healing: Secondary | ICD-10-CM | POA: Diagnosis not present

## 2016-07-06 DIAGNOSIS — M25552 Pain in left hip: Secondary | ICD-10-CM | POA: Diagnosis not present

## 2016-07-06 DIAGNOSIS — J449 Chronic obstructive pulmonary disease, unspecified: Secondary | ICD-10-CM | POA: Diagnosis not present

## 2016-07-06 NOTE — Telephone Encounter (Signed)
Ok for verbal 

## 2016-07-06 NOTE — Telephone Encounter (Signed)
Verbal given 

## 2016-07-06 NOTE — Telephone Encounter (Signed)
Aaron Edelman, OT from [facility], request verbal order to see pt 2 times a week for 4 weeks to work on strength,indurance and balance. Please call him back

## 2016-07-06 NOTE — Telephone Encounter (Signed)
Antonio is calling for verbals for PT  2 week/ 6  Home health aid 2 week/ 4

## 2016-07-07 DIAGNOSIS — M5136 Other intervertebral disc degeneration, lumbar region: Secondary | ICD-10-CM | POA: Diagnosis not present

## 2016-07-07 DIAGNOSIS — M810 Age-related osteoporosis without current pathological fracture: Secondary | ICD-10-CM | POA: Diagnosis not present

## 2016-07-07 DIAGNOSIS — M0609 Rheumatoid arthritis without rheumatoid factor, multiple sites: Secondary | ICD-10-CM | POA: Diagnosis not present

## 2016-07-08 ENCOUNTER — Encounter: Payer: Self-pay | Admitting: Internal Medicine

## 2016-07-08 ENCOUNTER — Ambulatory Visit (INDEPENDENT_AMBULATORY_CARE_PROVIDER_SITE_OTHER): Payer: Medicare Other | Admitting: Internal Medicine

## 2016-07-08 ENCOUNTER — Other Ambulatory Visit (INDEPENDENT_AMBULATORY_CARE_PROVIDER_SITE_OTHER): Payer: Medicare Other

## 2016-07-08 DIAGNOSIS — R339 Retention of urine, unspecified: Secondary | ICD-10-CM | POA: Diagnosis not present

## 2016-07-08 DIAGNOSIS — J449 Chronic obstructive pulmonary disease, unspecified: Secondary | ICD-10-CM | POA: Diagnosis not present

## 2016-07-08 DIAGNOSIS — Z4789 Encounter for other orthopedic aftercare: Secondary | ICD-10-CM | POA: Diagnosis not present

## 2016-07-08 DIAGNOSIS — E0822 Diabetes mellitus due to underlying condition with diabetic chronic kidney disease: Secondary | ICD-10-CM | POA: Diagnosis not present

## 2016-07-08 DIAGNOSIS — M25551 Pain in right hip: Secondary | ICD-10-CM | POA: Diagnosis not present

## 2016-07-08 DIAGNOSIS — I10 Essential (primary) hypertension: Secondary | ICD-10-CM | POA: Diagnosis not present

## 2016-07-08 DIAGNOSIS — M069 Rheumatoid arthritis, unspecified: Secondary | ICD-10-CM | POA: Diagnosis not present

## 2016-07-08 DIAGNOSIS — N181 Chronic kidney disease, stage 1: Secondary | ICD-10-CM

## 2016-07-08 DIAGNOSIS — S32059D Unspecified fracture of fifth lumbar vertebra, subsequent encounter for fracture with routine healing: Secondary | ICD-10-CM | POA: Diagnosis not present

## 2016-07-08 DIAGNOSIS — M25552 Pain in left hip: Secondary | ICD-10-CM | POA: Diagnosis not present

## 2016-07-08 LAB — URINALYSIS, ROUTINE W REFLEX MICROSCOPIC
Bilirubin Urine: NEGATIVE
Hgb urine dipstick: NEGATIVE
Ketones, ur: NEGATIVE
Leukocytes, UA: NEGATIVE
Nitrite: NEGATIVE
SPECIFIC GRAVITY, URINE: 1.02 (ref 1.000–1.030)
Total Protein, Urine: NEGATIVE
Urine Glucose: NEGATIVE
Urobilinogen, UA: 1 (ref 0.0–1.0)
pH: 5.5 (ref 5.0–8.0)

## 2016-07-08 NOTE — Progress Notes (Signed)
Pre visit review using our clinic review tool, if applicable. No additional management support is needed unless otherwise documented below in the visit note. 

## 2016-07-08 NOTE — Assessment & Plan Note (Signed)
Mild, for repeat urine studies today, tx as appropriate, for urology next wk as planned,  to f/u any worsening symptoms or concerns

## 2016-07-08 NOTE — Assessment & Plan Note (Signed)
Lab Results  Component Value Date   HGBA1C 5.7 03/31/2016

## 2016-07-08 NOTE — Progress Notes (Signed)
Subjective:    Patient ID: Tara Neal, female    DOB: [birth year], 75 y.o.   MRN: [id number]  HPI  Here to f/u after recent episode AMS/delerium and UTI, with left hip pain though related to DJD, found to have recent fall prior to UTI and L5 compression fx, s/p what sounds like kyphoplasty last wk, still with significant pain on gabapentin and hydrocodone, wished for better pain control. Had recurrent symptoms urinary pain 3 time in the past month, not currently on antibx.   Has [facility].  Has appt with urology next wk, still with significant retention per pt, at 80% but not sure how she know this.  Husband wants to change to [facility], but not today. Denies urinary symptoms such as dysuria, frequency, urgency, flank pain, hematuria or n/v, fever, chills today Not currently on antibx Past Medical History:  Diagnosis Date  . Allergy   . Anemia    hx of   . Anxiety   . Chronic insomnia 04/22/2012  . COPD (chronic obstructive pulmonary disease) ([place])   . Diverticulosis of colon   . GERD (gastroesophageal reflux disease)   . Glaucoma   . Hormone replacement therapy (postmenopausal)   . Hyperlipidemia   . Hypertension   . Low back pain   . Migraine headache    denies  . Mild obesity   . Osteoarthritis   . Osteopenia   . Osteoporosis   . Pneumonia    hx of 5 years ago   . Scoliosis   . Shortness of breath    with exertion   . Spontaneous pneumothorax    1968  . Umbilical hernia [id number]   Past Surgical History:  Procedure Laterality Date  . EXCISION/RELEASE BURSA HIP  04/26/2012   Procedure: EXCISION/RELEASE BURSA HIP;  Surgeon: Gearlean Alf, MD;  Location: [facility];  Service: Orthopedics;  Laterality: Right;  . EYE SURGERY     - bil cataract surg and Retina surgery to left eye  . HERNIA REPAIR  [id number]   umbilical hernia  . HIP ADDUCTOR TENOTOMY    . IR GENERIC HISTORICAL  06/17/2016   IR RADIOLOGIST EVAL & MGMT 06/17/2016 [facility]  . IR GENERIC HISTORICAL  07/01/2016    IR VERTEBROPLASTY LUMBAR BX INC UNI/BIL INC/INJECT/IMAGING 07/01/2016 Luanne Bras, MD [facility]  . LUMBAR DISC SURGERY      x 2  . OTHER SURGICAL HISTORY     benign growth removed from right under ear at age 35   . SHOULDER ARTHROSCOPY     left shoulder  . TONSILLECTOMY    . TONSILLECTOMY    . WRIST FRACTURE SURGERY     plate in left wrist/ fractured 2 times    reports that she quit smoking about 34 years ago. Her smoking use included Cigarettes. She quit after 20.00 years of use. She has never used smokeless tobacco. She reports that she drinks alcohol. She reports that she does not use drugs. family history includes Diabetes in her mother; Heart disease in her mother; Hyperlipidemia in her mother. Allergies  Allergen Reactions  . Codeine Nausea Only  . Sulfonamide Derivatives Nausea Only    REACTION: Nausea  . Penicillins Rash    .Marland KitchenHas patient had a PCN reaction causing immediate rash, facial/tongue/throat swelling, SOB or lightheadedness with hypotension: No Has patient had a PCN reaction causing severe rash involving mucus membranes or skin necrosis: No Has patient had a PCN reaction that required hospitalization No Has patient  had a PCN reaction occurring within the last 10 years: No If all of the above answers are "NO", then may proceed with Cephalosporin use.   Marland Kitchen Percocet [Oxycodone-Acetaminophen] Itching   Current Outpatient Prescriptions on File Prior to Visit  Medication Sig Dispense Refill  . acetaminophen (TYLENOL) 325 MG tablet Take 650 mg by mouth at bedtime.    Marland Kitchen alendronate (FOSAMAX) 70 MG tablet TAKE 1 TABLET EVERY 7 DAYS (ON SATURDAYS). TAKE WITH A FULL GLASS OF WATER ON AN EMPTY STOMACH 12 tablet 3  . Ascorbic Acid (VITAMIN C) 1000 MG tablet Take 1,000 mg by mouth daily.    Marland Kitchen atenolol (TENORMIN) 50 MG tablet TAKE 1 TABLET EVERY DAY (Patient taking differently: TAKE 1 TABLET PO EVERY DAY) 90 tablet 3  . benazepril (LOTENSIN) 40 MG tablet TAKE 1 TABLET  EVERY DAY (Patient taking differently: TAKE 1 TABLET PO EVERY DAY) 90 tablet 3  . calcium carbonate (OS-CAL) 600 MG TABS Take 600 mg by mouth 2 (two) times daily with a meal.    . cefdinir (OMNICEF) 300 MG capsule Take 1 capsule (300 mg total) by mouth 2 (two) times daily. 6 capsule 0  . Cholecalciferol (VITAMIN D3) 1000 UNITS CAPS Take 1 capsule by mouth daily.    . fentaNYL (DURAGESIC - DOSED MCG/HR) 25 MCG/HR patch Place 25 mcg onto the skin every 3 (three) days.    . folic acid (FOLVITE) 1 MG tablet Take 1 mg by mouth daily.    . furosemide (LASIX) 40 MG tablet TAKE 1 TABLET EVERY DAY WITH BREAKFAST 90 tablet 3  . gabapentin (NEURONTIN) 300 MG capsule Take 300 mg by mouth 2 (two) times daily.    Marland Kitchen HYDROcodone-acetaminophen (NORCO/VICODIN) 5-325 MG tablet Take 1 tablet by mouth every 8 (eight) hours as needed for moderate pain.    Marland Kitchen latanoprost (XALATAN) 0.005 % ophthalmic solution Place 1 drop into both eyes at bedtime.     . Melatonin 3 MG TABS Take 2 tablets by mouth at bedtime.    . methotrexate 250 MG/10ML injection Inject 1 mL (25 mg total) into the skin once a week. 10 mL   . Multiple Vitamin (MULTIVITAMIN) tablet Take 1 tablet by mouth daily.    . Omega-3 Fatty Acids (FISH OIL) 1200 MG CAPS Take 2 capsules by mouth daily.     Marland Kitchen omeprazole (PRILOSEC) 20 MG capsule TAKE 2 CAPSULES EVERY DAY 180 capsule 3  . polyethylene glycol (MIRALAX / GLYCOLAX) packet Take 17 g by mouth daily.    Marland Kitchen senna-docusate (SENOKOT-S) 8.6-50 MG tablet Take 1 tablet by mouth 2 (two) times daily.    . simvastatin (ZOCOR) 40 MG tablet TAKE 1 TABLET EVERY DAY  AT  6PM 90 tablet 3  . timolol (TIMOPTIC) 0.5 % ophthalmic solution Place 1 drop into both eyes daily.    Marland Kitchen venlafaxine XR (EFFEXOR-XR) 150 MG 24 hr capsule TAKE 1 CAPSULE EVERY DAY WITH BREAKFAST 90 capsule 3   No current facility-administered medications on file prior to visit.    Review of Systems  Constitutional: Negative for unusual diaphoresis or  night sweats HENT: Negative for ear swelling or discharge Eyes: Negative for worsening visual haziness  Respiratory: Negative for choking and stridor.   Gastrointestinal: Negative for distension or worsening eructation Genitourinary: Negative for retention or change in urine volume.  Musculoskeletal: Negative for other MSK pain or swelling Skin: Negative for color change and worsening wound Neurological: Negative for tremors and numbness other than noted  Psychiatric/Behavioral: Negative  for decreased concentration or agitation other than above       Objective:   Physical Exam BP 120/78   Pulse (!) 101   Temp 98.4 F (36.9 C) (Oral)   Resp 20   Wt 130 lb (59 kg)   SpO2 91%   BMI 22.31 kg/m  VS noted, walks with walker, thinner, frail, cognition somewhat slower but orient x 3, has general weakness Constitutional: Pt appears in no apparent distress HENT: Head: NCAT.  Right Ear: External ear normal.  Left Ear: External ear normal.  Eyes: . Pupils are equal, round, and reactive to light. Conjunctivae and EOM are normal Neck: Normal range of motion. Neck supple.  Cardiovascular: Normal rate and regular rhythm.   Pulmonary/Chest: Effort normal and breath sounds decreased without rales or wheezing.  Abd:  Soft, NT, ND, + BS, no flank tender Neurological: Pt is alert. Not confused , motor grossly intact Skin: Skin is warm. No rash, no LE edema Psychiatric: Pt behavior is normal. No agitation.     Assessment & Plan:

## 2016-07-08 NOTE — Patient Instructions (Signed)
Please continue all other medications as before, and refills have been done if requested.  Please have the pharmacy call with any other refills you may need.  Please continue your efforts at being more active, low cholesterol diet, and weight control.  You are otherwise up to date with prevention measures today.  Please keep your appointments with your specialists as you may have planned  Please go to the LAB in the Basement (turn left off the elevator) for the tests to be done today  You will be contacted by phone if any changes need to be made immediately.  Otherwise, you will receive a letter about your results with an explanation, but please check with MyChart first.  Please remember to sign up for MyChart if you have not done so, as this will be important to you in the future with finding out test results, communicating by private email, and scheduling acute appointments online when needed.  Please return in 3 months, or sooner if needed 

## 2016-07-08 NOTE — Assessment & Plan Note (Signed)
stable overall by history and exam, recent data reviewed with pt, and pt to continue medical treatment as before,  to f/u any worsening symptoms or concerns @LASTSAO2(3)@  

## 2016-07-08 NOTE — Assessment & Plan Note (Signed)
stable overall by history and exam, recent data reviewed with pt, and pt to continue medical treatment as before,  to f/u any worsening symptoms or concerns BP Readings from Last 3 Encounters:  07/08/16 120/78  07/01/16 121/75  05/19/16 132/71

## 2016-07-09 DIAGNOSIS — M25551 Pain in right hip: Secondary | ICD-10-CM | POA: Diagnosis not present

## 2016-07-09 DIAGNOSIS — M25552 Pain in left hip: Secondary | ICD-10-CM | POA: Diagnosis not present

## 2016-07-09 DIAGNOSIS — J449 Chronic obstructive pulmonary disease, unspecified: Secondary | ICD-10-CM | POA: Diagnosis not present

## 2016-07-09 DIAGNOSIS — S32059D Unspecified fracture of fifth lumbar vertebra, subsequent encounter for fracture with routine healing: Secondary | ICD-10-CM | POA: Diagnosis not present

## 2016-07-09 DIAGNOSIS — Z4789 Encounter for other orthopedic aftercare: Secondary | ICD-10-CM | POA: Diagnosis not present

## 2016-07-09 DIAGNOSIS — M069 Rheumatoid arthritis, unspecified: Secondary | ICD-10-CM | POA: Diagnosis not present

## 2016-07-09 LAB — URINE CULTURE: Organism ID, Bacteria: 10000

## 2016-07-12 DIAGNOSIS — Z4789 Encounter for other orthopedic aftercare: Secondary | ICD-10-CM | POA: Diagnosis not present

## 2016-07-12 DIAGNOSIS — M069 Rheumatoid arthritis, unspecified: Secondary | ICD-10-CM | POA: Diagnosis not present

## 2016-07-12 DIAGNOSIS — S32059D Unspecified fracture of fifth lumbar vertebra, subsequent encounter for fracture with routine healing: Secondary | ICD-10-CM | POA: Diagnosis not present

## 2016-07-12 DIAGNOSIS — J449 Chronic obstructive pulmonary disease, unspecified: Secondary | ICD-10-CM | POA: Diagnosis not present

## 2016-07-12 DIAGNOSIS — M25551 Pain in right hip: Secondary | ICD-10-CM | POA: Diagnosis not present

## 2016-07-12 DIAGNOSIS — M25552 Pain in left hip: Secondary | ICD-10-CM | POA: Diagnosis not present

## 2016-07-13 DIAGNOSIS — J449 Chronic obstructive pulmonary disease, unspecified: Secondary | ICD-10-CM | POA: Diagnosis not present

## 2016-07-13 DIAGNOSIS — M069 Rheumatoid arthritis, unspecified: Secondary | ICD-10-CM | POA: Diagnosis not present

## 2016-07-13 DIAGNOSIS — M25552 Pain in left hip: Secondary | ICD-10-CM | POA: Diagnosis not present

## 2016-07-13 DIAGNOSIS — S32059D Unspecified fracture of fifth lumbar vertebra, subsequent encounter for fracture with routine healing: Secondary | ICD-10-CM | POA: Diagnosis not present

## 2016-07-13 DIAGNOSIS — Z4789 Encounter for other orthopedic aftercare: Secondary | ICD-10-CM | POA: Diagnosis not present

## 2016-07-13 DIAGNOSIS — M25551 Pain in right hip: Secondary | ICD-10-CM | POA: Diagnosis not present

## 2016-07-14 DIAGNOSIS — S32059D Unspecified fracture of fifth lumbar vertebra, subsequent encounter for fracture with routine healing: Secondary | ICD-10-CM | POA: Diagnosis not present

## 2016-07-14 DIAGNOSIS — M25551 Pain in right hip: Secondary | ICD-10-CM | POA: Diagnosis not present

## 2016-07-14 DIAGNOSIS — M25552 Pain in left hip: Secondary | ICD-10-CM | POA: Diagnosis not present

## 2016-07-14 DIAGNOSIS — M069 Rheumatoid arthritis, unspecified: Secondary | ICD-10-CM | POA: Diagnosis not present

## 2016-07-14 DIAGNOSIS — Z4789 Encounter for other orthopedic aftercare: Secondary | ICD-10-CM | POA: Diagnosis not present

## 2016-07-14 DIAGNOSIS — J449 Chronic obstructive pulmonary disease, unspecified: Secondary | ICD-10-CM | POA: Diagnosis not present

## 2016-07-16 ENCOUNTER — Ambulatory Visit (HOSPITAL_COMMUNITY)
Admission: RE | Admit: 2016-07-16 | Discharge: 2016-07-16 | Disposition: A | Discharge status: Home / Self Care | Payer: Medicare Other | Source: Ambulatory Visit | Attending: Interventional Radiology | Admitting: Interventional Radiology

## 2016-07-16 DIAGNOSIS — IMO0002 Reserved for concepts with insufficient information to code with codable children: Secondary | ICD-10-CM

## 2016-07-16 DIAGNOSIS — M069 Rheumatoid arthritis, unspecified: Secondary | ICD-10-CM | POA: Diagnosis not present

## 2016-07-16 DIAGNOSIS — M4856XA Collapsed vertebra, not elsewhere classified, lumbar region, initial encounter for fracture: Secondary | ICD-10-CM | POA: Diagnosis not present

## 2016-07-16 DIAGNOSIS — J449 Chronic obstructive pulmonary disease, unspecified: Secondary | ICD-10-CM | POA: Diagnosis not present

## 2016-07-16 DIAGNOSIS — Z4789 Encounter for other orthopedic aftercare: Secondary | ICD-10-CM | POA: Diagnosis not present

## 2016-07-16 DIAGNOSIS — M79605 Pain in left leg: Secondary | ICD-10-CM | POA: Diagnosis not present

## 2016-07-16 DIAGNOSIS — S32059D Unspecified fracture of fifth lumbar vertebra, subsequent encounter for fracture with routine healing: Secondary | ICD-10-CM | POA: Diagnosis not present

## 2016-07-16 DIAGNOSIS — M25552 Pain in left hip: Secondary | ICD-10-CM | POA: Diagnosis not present

## 2016-07-16 DIAGNOSIS — M25551 Pain in right hip: Secondary | ICD-10-CM | POA: Diagnosis not present

## 2016-07-16 HISTORY — PX: IR GENERIC HISTORICAL: IMG1180011

## 2016-07-19 ENCOUNTER — Encounter (HOSPITAL_COMMUNITY): Payer: Self-pay | Admitting: Interventional Radiology

## 2016-07-19 DIAGNOSIS — H401133 Primary open-angle glaucoma, bilateral, severe stage: Secondary | ICD-10-CM | POA: Diagnosis not present

## 2016-07-19 DIAGNOSIS — Z961 Presence of intraocular lens: Secondary | ICD-10-CM | POA: Diagnosis not present

## 2016-07-19 DIAGNOSIS — H16103 Unspecified superficial keratitis, bilateral: Secondary | ICD-10-CM | POA: Diagnosis not present

## 2016-07-19 DIAGNOSIS — H04123 Dry eye syndrome of bilateral lacrimal glands: Secondary | ICD-10-CM | POA: Diagnosis not present

## 2016-07-20 ENCOUNTER — Telehealth: Payer: Self-pay

## 2016-07-20 DIAGNOSIS — S32059D Unspecified fracture of fifth lumbar vertebra, subsequent encounter for fracture with routine healing: Secondary | ICD-10-CM | POA: Diagnosis not present

## 2016-07-20 DIAGNOSIS — J449 Chronic obstructive pulmonary disease, unspecified: Secondary | ICD-10-CM | POA: Diagnosis not present

## 2016-07-20 DIAGNOSIS — M25551 Pain in right hip: Secondary | ICD-10-CM | POA: Diagnosis not present

## 2016-07-20 DIAGNOSIS — Z4789 Encounter for other orthopedic aftercare: Secondary | ICD-10-CM | POA: Diagnosis not present

## 2016-07-20 DIAGNOSIS — M25552 Pain in left hip: Secondary | ICD-10-CM | POA: Diagnosis not present

## 2016-07-20 DIAGNOSIS — M069 Rheumatoid arthritis, unspecified: Secondary | ICD-10-CM | POA: Diagnosis not present

## 2016-07-20 NOTE — Telephone Encounter (Signed)
Home Health Cert/Plan of Care received (07/05/2016 - 09/02/2016) and placed on MD's desk for signature

## 2016-07-21 DIAGNOSIS — Z4789 Encounter for other orthopedic aftercare: Secondary | ICD-10-CM | POA: Diagnosis not present

## 2016-07-21 DIAGNOSIS — M25551 Pain in right hip: Secondary | ICD-10-CM | POA: Diagnosis not present

## 2016-07-21 DIAGNOSIS — M069 Rheumatoid arthritis, unspecified: Secondary | ICD-10-CM | POA: Diagnosis not present

## 2016-07-21 DIAGNOSIS — M25552 Pain in left hip: Secondary | ICD-10-CM | POA: Diagnosis not present

## 2016-07-21 DIAGNOSIS — J449 Chronic obstructive pulmonary disease, unspecified: Secondary | ICD-10-CM | POA: Diagnosis not present

## 2016-07-21 DIAGNOSIS — S32059D Unspecified fracture of fifth lumbar vertebra, subsequent encounter for fracture with routine healing: Secondary | ICD-10-CM | POA: Diagnosis not present

## 2016-07-21 DIAGNOSIS — N312 Flaccid neuropathic bladder, not elsewhere classified: Secondary | ICD-10-CM | POA: Diagnosis not present

## 2016-07-23 NOTE — Telephone Encounter (Signed)
Paperwork signed, faxed, copy sent to scan 

## 2016-07-27 DIAGNOSIS — S32059D Unspecified fracture of fifth lumbar vertebra, subsequent encounter for fracture with routine healing: Secondary | ICD-10-CM | POA: Diagnosis not present

## 2016-07-27 DIAGNOSIS — M25551 Pain in right hip: Secondary | ICD-10-CM | POA: Diagnosis not present

## 2016-07-27 DIAGNOSIS — J449 Chronic obstructive pulmonary disease, unspecified: Secondary | ICD-10-CM | POA: Diagnosis not present

## 2016-07-27 DIAGNOSIS — M25552 Pain in left hip: Secondary | ICD-10-CM | POA: Diagnosis not present

## 2016-07-27 DIAGNOSIS — Z4789 Encounter for other orthopedic aftercare: Secondary | ICD-10-CM | POA: Diagnosis not present

## 2016-07-27 DIAGNOSIS — M069 Rheumatoid arthritis, unspecified: Secondary | ICD-10-CM | POA: Diagnosis not present

## 2016-07-28 DIAGNOSIS — S32059D Unspecified fracture of fifth lumbar vertebra, subsequent encounter for fracture with routine healing: Secondary | ICD-10-CM | POA: Diagnosis not present

## 2016-07-28 DIAGNOSIS — Z4789 Encounter for other orthopedic aftercare: Secondary | ICD-10-CM | POA: Diagnosis not present

## 2016-07-28 DIAGNOSIS — J449 Chronic obstructive pulmonary disease, unspecified: Secondary | ICD-10-CM | POA: Diagnosis not present

## 2016-07-28 DIAGNOSIS — M25551 Pain in right hip: Secondary | ICD-10-CM | POA: Diagnosis not present

## 2016-07-28 DIAGNOSIS — M25552 Pain in left hip: Secondary | ICD-10-CM | POA: Diagnosis not present

## 2016-07-28 DIAGNOSIS — M069 Rheumatoid arthritis, unspecified: Secondary | ICD-10-CM | POA: Diagnosis not present

## 2016-07-29 DIAGNOSIS — M069 Rheumatoid arthritis, unspecified: Secondary | ICD-10-CM | POA: Diagnosis not present

## 2016-07-29 DIAGNOSIS — M25552 Pain in left hip: Secondary | ICD-10-CM | POA: Diagnosis not present

## 2016-07-29 DIAGNOSIS — Z4789 Encounter for other orthopedic aftercare: Secondary | ICD-10-CM | POA: Diagnosis not present

## 2016-07-29 DIAGNOSIS — M25551 Pain in right hip: Secondary | ICD-10-CM | POA: Diagnosis not present

## 2016-07-29 DIAGNOSIS — S32059D Unspecified fracture of fifth lumbar vertebra, subsequent encounter for fracture with routine healing: Secondary | ICD-10-CM | POA: Diagnosis not present

## 2016-07-29 DIAGNOSIS — J449 Chronic obstructive pulmonary disease, unspecified: Secondary | ICD-10-CM | POA: Diagnosis not present

## 2016-08-05 ENCOUNTER — Ambulatory Visit (INDEPENDENT_AMBULATORY_CARE_PROVIDER_SITE_OTHER): Payer: Medicare Other | Admitting: Podiatry

## 2016-08-05 DIAGNOSIS — B351 Tinea unguium: Secondary | ICD-10-CM

## 2016-08-05 DIAGNOSIS — M79671 Pain in right foot: Secondary | ICD-10-CM | POA: Diagnosis not present

## 2016-08-05 DIAGNOSIS — M79604 Pain in right leg: Secondary | ICD-10-CM

## 2016-08-05 DIAGNOSIS — M79605 Pain in left leg: Secondary | ICD-10-CM

## 2016-08-05 DIAGNOSIS — M79672 Pain in left foot: Secondary | ICD-10-CM

## 2016-08-06 NOTE — Progress Notes (Signed)
Subjective:     Patient ID: Tara Neal, female   DOB: 1941-08-05, 75 y.o.   MRN: [id number]  HPI patient presents with incurvated painful nailbeds 1-5 both feet that make it hard for her to walk with   Review of Systems     Objective:   Physical Exam Neurovascular status intact with thick yellow brittle nailbeds 1-5 both feet that are painful    Assessment:     Mycotic nail infection with pain 1-5 both feet    Plan:     Debris painful nailbeds 1-5 both feet with no iatrogenic bleeding noted

## 2016-08-10 NOTE — Progress Notes (Signed)
Tara Neal was seen today in the movement disorders clinic for neurologic consultation at the request of [name], MD.  This patient is accompanied in the office by her spouse who supplements the history.  The records that were made available to me were reviewed  The consultation is for the evaluation of gait change. Pt states that sx's started 3 weeks ago and states that she had no problems at all before that but husband points out that over a year ago, she had fx of the wrist from a fall.  He thinks that this has been going on for much longer than 3 weeks (estimates slowly for last few years).  Her husband does state that she went to get up from the chair 3 weeks and she felt off balance.  She was "dizzy" but denies vertigo.  Each time she got up she got that feeling; she initially stated that she never had it lying down or seated but then her husband said that she did and she agreed that it was that way but isn't any more.  .The patient saw Dr. [name] on 04/10/15 and was c/o dizziness.  She was dx with OH and her lasix was d/c.  She states that she thinks it helped somewhat but she still gets some dizziness when she stands up but it seemed to help the sitting/laying.  She denies n/v.  No palpitations.  No weakness or paresthesias that lateralize.  She denies speech/swallowing problems but her husband interjects that she "gets choked" sometimes when eating.  Her husband states that the patient believes that she does not walk well because she cannot see well, but she just went to the eye doctor a few weeks ago and her vision was not changed and was unremarkable.  She has an MRI brain pending.  08/11/16 update:  The patient follows up today, accompanied by her husband/daughter who supplements the history.  I have reviewed numerous records.  I have not seen her in about a year.  At last visit, she had evidence of peripheral neuropathy on examination and subsequently had an EMG on 05/06/2015 that  demonstrated severe peripheral neuropathy.  She had a workup for reversible causes.  Her hemoglobin A1c was 5.7, B12 444.  No monoclonal proteins in the urine.  There was slight restricted mobility on her SPEP immunofixation in IgG and lambda regions.  Her folate was normal.  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  She and MRI of the brain that then started white matter disease.  MRA of the brain was negative.  Her MRI of the cervical spine demonstrated diffuse degenerative changes.  Neurosurgical consultation was recommended, She went and I just received records from Dr [name] yesterday.  No surgery was recommended.  The patient reports that she did fairly well over the years until she fell in July, 2017.  She had a repeat MRI that demonstrated an acute/subacute endplate fracture at the L5 region and a chronic fracture at the L3 region.  She subsequently had kyphoplasty by Dr. [name] in August, 2017.  This helped the acute back pain, but she continues to have pain radiating down the left leg.  States that the pain is crampy in nature and will draw the foot back.  Daughter states the bigger concern is  memory change and a problem with her L hand/arm.  Daughter states first episode was in June.  Her husband states that she had trouble moving the L arm.  She could move it but wouldn't and kept it flexed at the chest.  They also noted L facial droop and that lasted 3 days.  She was admitted to the hospital but told that she had a UTI.  No brain neuroimaging was done.  States that it is overall better but she still doesn't use the L arm like she used to.  This is not documented in hospital records but rather L hip pain was noted.  Husband states that several weeks ago, she fell and hit her head and had a "thick tongue" for  several days.  Husband states that she would want to know what she wanted to say.  Daughter reports that sometimes when she has an "off" day, she will stare off and seem to have speech issues.  Daughter concerned about PD or MSA (had a family member recently dx with that)    ALLERGIES:   Allergies  Allergen Reactions  . Codeine Nausea Only  . Sulfonamide Derivatives Nausea Only    REACTION: Nausea  . Penicillins Rash    .Marland KitchenHas patient had a PCN reaction causing immediate rash, facial/tongue/throat swelling, SOB or lightheadedness with hypotension: No Has patient had a PCN reaction causing severe rash involving mucus membranes or skin necrosis: No Has patient had a PCN reaction that required hospitalization No Has patient had a PCN reaction occurring within the last 10 years: No If all of the above answers are "NO", then may proceed with Cephalosporin use.   Marland Kitchen Percocet [Oxycodone-Acetaminophen] Itching    CURRENT MEDICATIONS:  Outpatient Encounter Prescriptions as of 08/11/2016  Medication Sig  . acetaminophen (TYLENOL) 325 MG tablet Take 650 mg by mouth at bedtime.  Marland Kitchen alendronate (FOSAMAX) 70 MG tablet TAKE 1 TABLET EVERY 7 DAYS (ON SATURDAYS). TAKE WITH A FULL GLASS OF WATER ON AN EMPTY STOMACH  . Ascorbic Acid (VITAMIN C) 1000 MG tablet Take 1,000 mg by mouth daily.  Marland Kitchen atenolol (TENORMIN) 50 MG tablet TAKE 1 TABLET EVERY DAY (Patient taking differently: TAKE 1 TABLET PO EVERY DAY)  . benazepril (LOTENSIN) 40 MG tablet TAKE 1 TABLET EVERY DAY (Patient taking differently: TAKE 1 TABLET PO EVERY DAY)  . calcium carbonate (OS-CAL) 600 MG TABS Take 600 mg by mouth 2 (two) times daily with a meal.  . Cholecalciferol (VITAMIN D3) 1000 UNITS CAPS Take 1 capsule by mouth daily.  . fentaNYL (DURAGESIC - DOSED MCG/HR) 25 MCG/HR patch Place 25 mcg onto the skin every 3 (three) days.  . folic acid (FOLVITE) 1 MG tablet Take 1 mg by mouth daily.  . furosemide (LASIX) 40 MG tablet TAKE 1 TABLET  EVERY DAY WITH BREAKFAST  . HYDROcodone-acetaminophen (NORCO/VICODIN) 5-325 MG tablet Take 1 tablet by mouth every 8 (eight) hours as needed for moderate pain.  Marland Kitchen latanoprost (XALATAN) 0.005 % ophthalmic solution Place 1 drop into both eyes at bedtime.   . Melatonin 3 MG TABS Take 2 tablets by mouth at bedtime.  . meloxicam (MOBIC) 7.5 MG tablet Take 15 mg by mouth daily.   . methotrexate 250 MG/10ML injection Inject 1 mL (25 mg total) into the skin once a week.  . Multiple Vitamin (MULTIVITAMIN) tablet Take 1 tablet by mouth daily.  . Omega-3 Fatty Acids (FISH OIL) 1200 MG CAPS Take 2 capsules by mouth daily.   Marland Kitchen omeprazole (  PRILOSEC) 20 MG capsule TAKE 2 CAPSULES EVERY DAY  . senna-docusate (SENOKOT-S) 8.6-50 MG tablet Take 1 tablet by mouth 2 (two) times daily.  . simvastatin (ZOCOR) 40 MG tablet TAKE 1 TABLET EVERY DAY  AT  6PM  . timolol (TIMOPTIC) 0.5 % ophthalmic solution Place 1 drop into both eyes daily.  Marland Kitchen venlafaxine XR (EFFEXOR-XR) 150 MG 24 hr capsule TAKE 1 CAPSULE EVERY DAY WITH BREAKFAST  . polyethylene glycol (MIRALAX / GLYCOLAX) packet Take 17 g by mouth daily.  . [DISCONTINUED] cefdinir (OMNICEF) 300 MG capsule Take 1 capsule (300 mg total) by mouth 2 (two) times daily.  . [DISCONTINUED] gabapentin (NEURONTIN) 300 MG capsule Take 300 mg by mouth 2 (two) times daily.   No facility-administered encounter medications on file as of 08/11/2016.     PAST MEDICAL HISTORY:   Past Medical History:  Diagnosis Date  . Allergy   . Anemia    hx of   . Anxiety   . Chronic insomnia 04/22/2012  . COPD (chronic obstructive pulmonary disease) ([place])   . Diverticulosis of colon   . GERD (gastroesophageal reflux disease)   . Glaucoma   . Hormone replacement therapy (postmenopausal)   . Hyperlipidemia   . Hypertension   . Low back pain   . Migraine headache    denies  . Mild obesity   . Osteoarthritis   . Osteopenia   . Osteoporosis   . Pneumonia    hx of 5 years ago   .  Scoliosis   . Shortness of breath    with exertion   . Spontaneous pneumothorax    1968  . Umbilical hernia [id number]    PAST SURGICAL HISTORY:   Past Surgical History:  Procedure Laterality Date  . EXCISION/RELEASE BURSA HIP  04/26/2012   Procedure: EXCISION/RELEASE BURSA HIP;  Surgeon: Gearlean Alf, MD;  Location: [facility];  Service: Orthopedics;  Laterality: Right;  . EYE SURGERY     - bil cataract surg and Retina surgery to left eye  . HERNIA REPAIR  [id number]   umbilical hernia  . HIP ADDUCTOR TENOTOMY    . IR GENERIC HISTORICAL  06/17/2016   IR RADIOLOGIST EVAL & MGMT 06/17/2016 [facility]  . IR GENERIC HISTORICAL  07/01/2016   IR VERTEBROPLASTY LUMBAR BX INC UNI/BIL INC/INJECT/IMAGING 07/01/2016 Luanne Bras, MD [facility]  . IR GENERIC HISTORICAL  07/16/2016   IR RADIOLOGIST EVAL & MGMT 07/16/2016 [facility]  . LUMBAR DISC SURGERY      x 2  . OTHER SURGICAL HISTORY     benign growth removed from right under ear at age 9   . SHOULDER ARTHROSCOPY     left shoulder  . TONSILLECTOMY    . TONSILLECTOMY    . WRIST FRACTURE SURGERY     plate in left wrist/ fractured 2 times    SOCIAL HISTORY:   Social History   Social History  . Marital status: Married    Spouse name: N/A  . Number of children: N/A  . Years of education: N/A   Occupational History  . Not on file.   Social History Main Topics  . Smoking status: Former Smoker    Years: 20.00    Types: Cigarettes    Quit date: 11/29/1981  . Smokeless tobacco: Never Used  . Alcohol use Yes     Comment: one glass of wine every 3 months  . Drug use: No  . Sexual activity: Not on file   Other Topics  Concern  . 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 Mother Deceased   heart disease, DM  . Father Deceased   killed in war  . Son Alive   back problems  . Daughter Alive   breast cancer    ROS:  A complete 10  system review of systems was obtained and was unremarkable apart from what is mentioned above.  PHYSICAL EXAMINATION:    VITALS:   Vitals:   08/11/16 1100  BP: 130/88  Pulse: (!) 56    GEN:  The patient appears stated age and is in NAD. HEENT:  Normocephalic, atraumatic.  The mucous membranes are moist. The superficial temporal arteries are without ropiness or tenderness. CV:  RRR Lungs:  CTAB Neck/HEME:  There are no carotid bruits bilaterally.  Neurological examination:  Orientation:  Montreal Cognitive Assessment  08/11/2016 04/24/2015  Visuospatial/ Executive (0/5) 1 4  Naming (0/3) 1 2  Attention: Read list of digits (0/2) 2 1  Attention: Read list of letters (0/1) 1 1  Attention: Serial 7 subtraction starting at 100 (0/3) 0 0  Language: Repeat phrase (0/2) 2 2  Language : Fluency (0/1) 0 0  Abstraction (0/2) 2 2  Delayed Recall (0/5) 3 4  Orientation (0/6) 5 6  Total 17 22  Adjusted Score (based on education) 18 23     Cranial nerves: There is good facial symmetry with the exception of slight decreased nasolabial fold on the right, resulting in minor facial droop around the right lip.  When she activates these muscles with smile, however, there is good facial symmetry.  The patient has decreased upgaze, but otherwise extraocular muscles are intact.  She has difficulty participating with formal confrontational testing. The speech is fluent and clear. Soft palate rises symmetrically and there is no tongue deviation. Hearing is intact to conversational tone. Sensation: Sensation is intact to light and pinprick throughout (facial, trunk, extremities). Vibration is decreased in a distal fashion. There is no extinction with double simultaneous stimulation. There is no sensory dermatomal level identified. Motor: Strength is 5/5 in the bilateral upper and lower extremities.   Shoulder shrug is equal and symmetric.  There is no pronator drift.   Deep tendon reflexes: Deep tendon  reflexes are 3/4 at the bilateral biceps, triceps, brachioradialis, patella and 1/4 at the bilateral achilles. Plantar responses are downgoing bilaterally.  There is no Hoffmans or Tromner's response bilaterally.  Movement examination: Tone: There is normal tone in the bilateral upper extremities.  The tone in the lower extremities is normal.  Abnormal movements: None Coordination:  There is no significant decremation with RAM's, with any formal of rapid alternating movements, including alternating supination and pronation of the forearm, hand opening and closing, finger taps, heel taps and toe taps. Gait and Station: The patient has some difficulty arising out of a deep-seated chair without the use of the hands.  She requires 1 person assist out of the wheelchair.  She has on a gait belt.  She is ataxic and unsteady  Lab Results  Component Value Date   VITAMINB12 444 04/24/2015   Lab Results  Component Value Date   TSH 1.34 03/31/2016   Lab Results  Component Value Date   HGBA1C 5.7 03/31/2016     Chemistry      Component Value Date/Time   NA 137 07/01/2016 0625   K  3.5 07/01/2016 0625   CL 100 (L) 07/01/2016 0625   CO2 28 07/01/2016 0625   BUN 12 07/01/2016 0625   CREATININE 0.90 07/01/2016 0625      Component Value Date/Time   CALCIUM 9.5 07/01/2016 0625   ALKPHOS 69 03/31/2016 1417   AST 19 03/31/2016 1417   ALT 7 03/31/2016 1417   BILITOT 0.5 03/31/2016 1417        ASSESSMENT/PLAN:  1. Gait change  -EMG demonstrated severe peripheral neuropathy in June, 2016.  Workup was essentially unremarkable, with the exception of a slight restricted in the IgG and lambda regions.  We will repeat her SPEP with immunofixation next visit (decided to wait on labs for a few weeks).  When I do this, will likely repeat B12 as well  -family worried about PD/MSA and didn't see evidence of either  -will order MRI brain  -neuro exam was non focal and non lateralizing today  2.   Significant hyperreflexia  -will do MRI cervical spine.    3.  Spells of transient lateralizing weakness and dysarthria  -sound too long to be seizure, but should be r/o.  Will do EEG and if neg, will do ambulatory EEG  -if above all negative, will look at echo  4.  Memory change  -pts MoCA has dropped over the year and suspect has developed underlying dementia.  Dementia can cause some of these sx's, but generally doesn't cause dysarthria.  Pt is not driving.  Safe home discussed.  Will consider neuropsych after above completed.    5.  Low back pain with lumbar radiculopathy and evidence of neural foraminal stenosis and central canal stenosis on the MRI.  -Her back pain is better after kyphoplasty, but continues to have evidence of lumbosacral radiculopathy on the left.  Talked about neurosurgery referral.  Had one previously a year ago and felt nonsurgical so wishes to hold.  6.  Will see her back after above is completed.

## 2016-08-11 ENCOUNTER — Encounter: Payer: Self-pay | Admitting: Neurology

## 2016-08-11 ENCOUNTER — Ambulatory Visit (INDEPENDENT_AMBULATORY_CARE_PROVIDER_SITE_OTHER): Payer: Medicare Other | Admitting: Neurology

## 2016-08-11 VITALS — BP 130/88 | HR 56

## 2016-08-11 DIAGNOSIS — J449 Chronic obstructive pulmonary disease, unspecified: Secondary | ICD-10-CM

## 2016-08-11 DIAGNOSIS — R413 Other amnesia: Secondary | ICD-10-CM | POA: Diagnosis not present

## 2016-08-11 DIAGNOSIS — R292 Abnormal reflex: Secondary | ICD-10-CM

## 2016-08-11 DIAGNOSIS — M069 Rheumatoid arthritis, unspecified: Secondary | ICD-10-CM

## 2016-08-11 DIAGNOSIS — R6889 Other general symptoms and signs: Secondary | ICD-10-CM | POA: Diagnosis not present

## 2016-08-11 DIAGNOSIS — F0390 Unspecified dementia without behavioral disturbance: Secondary | ICD-10-CM

## 2016-08-11 DIAGNOSIS — R471 Dysarthria and anarthria: Secondary | ICD-10-CM

## 2016-08-11 DIAGNOSIS — IMO0001 Reserved for inherently not codable concepts without codable children: Secondary | ICD-10-CM

## 2016-08-11 DIAGNOSIS — Z4789 Encounter for other orthopedic aftercare: Secondary | ICD-10-CM

## 2016-08-11 DIAGNOSIS — I10 Essential (primary) hypertension: Secondary | ICD-10-CM

## 2016-08-11 DIAGNOSIS — M25552 Pain in left hip: Secondary | ICD-10-CM

## 2016-08-11 DIAGNOSIS — G309 Alzheimer's disease, unspecified: Secondary | ICD-10-CM

## 2016-08-11 DIAGNOSIS — R296 Repeated falls: Secondary | ICD-10-CM

## 2016-08-11 DIAGNOSIS — M25551 Pain in right hip: Secondary | ICD-10-CM

## 2016-08-11 DIAGNOSIS — F0280 Dementia in other diseases classified elsewhere without behavioral disturbance: Secondary | ICD-10-CM

## 2016-08-11 NOTE — Patient Instructions (Signed)
1. We have sent a referral to [facility] for your MRI and they will call you directly to schedule your appt. They are located at [place]. If you need to contact them directly please call [phone].  2. We will schedule you for EEG, if normal we will schedule you for prolonged (48 hour) EEG.   3. Follow up after testing complete.

## 2016-08-16 ENCOUNTER — Ambulatory Visit (INDEPENDENT_AMBULATORY_CARE_PROVIDER_SITE_OTHER): Payer: Medicare Other | Admitting: Neurology

## 2016-08-16 DIAGNOSIS — R6889 Other general symptoms and signs: Secondary | ICD-10-CM

## 2016-08-16 DIAGNOSIS — IMO0001 Reserved for inherently not codable concepts without codable children: Secondary | ICD-10-CM

## 2016-08-16 NOTE — Procedures (Signed)
TECHNICAL SUMMARY:  A multichannel referential and bipolar montage EEG using the standard international 10-20 system was performed on the patient described as awake and drowsy.  The dominant background activity consists of a 7-7.5 hertz activity seen most prominantly over the posterior head region.  2-2-1/2 Hz activity can be seen in the frontal regions.  Low voltage fast (beta) activity is distributed symmetrically and maximally over the anterior head regions.  ACTIVATION:  Stepwise photic stimulation at 4-20 flashes per second was performed and did not elicit any abnormal waveforms.  Hyperventilation was not performed.  EPILEPTIFORM ACTIVITY:  There were no spikes, sharp waves or paroxysmal activity.  SLEEP:  Physiologic drowsiness was noted, but no stage II sleep  CARDIAC:  The EKG lead revealed a regular sinus rhythm.  IMPRESSION:  This is an abnormal EEG demonstrating a mild-moderate diffuse slowing of electrocerebral activity.  This can be seen in a wide variety of encephalopathic state including those of a toxic, metabolic, or degenerative nature.  There were no focal, hemispheric, or lateralizing features.  No epileptiform activity was recorded.

## 2016-08-17 ENCOUNTER — Telehealth: Payer: Self-pay | Admitting: Neurology

## 2016-08-17 NOTE — Telephone Encounter (Signed)
Patient made aware.   Tara Neal- Please call patient to schedule AMB EEG.

## 2016-08-17 NOTE — Telephone Encounter (Signed)
Patient's daughter called back and said that her Mother Tara Neal is confused and does not understand what is going on. Her daughter asked if you could call her with any results and her dad Wynonia Lawman could schedule and make appointments. Threasa Beards (daughter) # is [phone]. She would like you to call her with the results. Thank you

## 2016-08-17 NOTE — Telephone Encounter (Signed)
-----   Message from [place], DO sent at 08/16/2016  6:51 PM EDT ----- You can let pt know that brain waves have slowed some with age but no seizure waves on the EEG.  Recommend ambulatory as we have discussed

## 2016-08-17 NOTE — Telephone Encounter (Signed)
Left message on machine for patient's daughter to call back.   

## 2016-08-17 NOTE — Telephone Encounter (Signed)
Patient's daughter made aware of results.  

## 2016-08-18 ENCOUNTER — Ambulatory Visit
Admission: RE | Admit: 2016-08-18 | Discharge: 2016-08-18 | Disposition: A | Discharge status: Home / Self Care | Payer: Medicare Other | Source: Ambulatory Visit | Attending: Neurology | Admitting: Neurology

## 2016-08-18 DIAGNOSIS — M4802 Spinal stenosis, cervical region: Secondary | ICD-10-CM | POA: Diagnosis not present

## 2016-08-18 DIAGNOSIS — R292 Abnormal reflex: Secondary | ICD-10-CM

## 2016-08-18 DIAGNOSIS — R296 Repeated falls: Secondary | ICD-10-CM

## 2016-08-18 DIAGNOSIS — IMO0001 Reserved for inherently not codable concepts without codable children: Secondary | ICD-10-CM

## 2016-08-18 DIAGNOSIS — R413 Other amnesia: Secondary | ICD-10-CM

## 2016-08-18 DIAGNOSIS — R6889 Other general symptoms and signs: Principal | ICD-10-CM

## 2016-08-18 DIAGNOSIS — R410 Disorientation, unspecified: Secondary | ICD-10-CM | POA: Diagnosis not present

## 2016-08-20 ENCOUNTER — Telehealth: Payer: Self-pay | Admitting: Neurology

## 2016-08-20 DIAGNOSIS — M5412 Radiculopathy, cervical region: Secondary | ICD-10-CM

## 2016-08-20 NOTE — Telephone Encounter (Signed)
Patient's daughter made aware. EMG order entered and given to our front desk staff to schedule. Dr Tat discussed with Aaron Edelman to review the scans.

## 2016-08-20 NOTE — Telephone Encounter (Signed)
-----   Message from [place], DO sent at 08/19/2016  5:13 PM EDT ----- Let pt know that MRI brain looks okay - just some mild hardening of arteries.  MRI of cervical spine continues to show significant central and neural foraminal stenosis.  Luvenia Starch, will you ask Dr. Donald Pore nurse brian to review this new image.  Also, if patient willing, schedule EMG with Dr. Posey Pronto of [facility]

## 2016-08-23 ENCOUNTER — Ambulatory Visit (INDEPENDENT_AMBULATORY_CARE_PROVIDER_SITE_OTHER): Payer: Medicare Other | Admitting: Neurology

## 2016-08-23 DIAGNOSIS — R6889 Other general symptoms and signs: Secondary | ICD-10-CM

## 2016-08-23 DIAGNOSIS — IMO0001 Reserved for inherently not codable concepts without codable children: Secondary | ICD-10-CM

## 2016-08-24 DIAGNOSIS — R6889 Other general symptoms and signs: Secondary | ICD-10-CM | POA: Diagnosis not present

## 2016-08-28 NOTE — Procedures (Signed)
ELECTROENCEPHALOGRAM REPORT  Dates of Recording: 08/23/2016 to 08/25/2016  Patient's Name: Tara Neal MRN: [id number] Date of Birth: 1941/06/27  Referring Provider: Dr. Wells Guiles Tat  Procedure: 48-hour ambulatory EEG  History: This is a 76 year old woman with transient spells of left-sided weakness and dysarthria.  Medications: Tylenol, Fosamax, vitamin C, Tenormin, Lotension, Duragesic, Folvite, Lasix, Vicodin, Zocor, Effexor  Technical Summary: This is a 48-hour multichannel digital EEG recording measured by the international 10-20 system with electrodes applied with paste and impedances below 5000 ohms performed as portable with EKG monitoring.  The digital EEG was referentially recorded, reformatted, and digitally filtered in a variety of bipolar and referential montages for optimal display.    DESCRIPTION OF RECORDING: During maximal wakefulness, the background activity consisted of a symmetric 7-7.5 Hz posterior dominant rhythm which was reactive to eye opening.  There were no epileptiform discharges or focal slowing seen in wakefulness.  During the recording, the patient progresses through wakefulness, drowsiness, and Stage 2 sleep.  Again, there were no epileptiform discharges seen.  Events: There were no push button events.   There were no electrographic seizures seen.  EKG lead showed irregular rhythm.  IMPRESSION: This 48-hour ambulatory EEG study is mildly abnormal due to slowing of the posterior dominant rhythm.   CLINICAL CORRELATION of the above findings indicates mild diffuse cerebral dysfunction that is non-specific in etiology and may be seen with hypoxic/ischemic injury, toxic/metabolic encephalopathies, neurodegenerative conditions, or medication effect. Typical events were not captured. If further clinical questions remain, inpatient video EEG monitoring may be helpful.   Ellouise Newer, M.D.

## 2016-08-29 ENCOUNTER — Telehealth: Payer: Self-pay | Admitting: Neurology

## 2016-08-29 NOTE — Telephone Encounter (Signed)
Let pt know that no evidence of seizure on EEG

## 2016-08-30 ENCOUNTER — Telehealth: Payer: Self-pay | Admitting: Internal Medicine

## 2016-08-30 DIAGNOSIS — M810 Age-related osteoporosis without current pathological fracture: Secondary | ICD-10-CM | POA: Diagnosis not present

## 2016-08-30 DIAGNOSIS — M4802 Spinal stenosis, cervical region: Secondary | ICD-10-CM | POA: Diagnosis not present

## 2016-08-30 DIAGNOSIS — Z681 Body mass index (BMI) 19 or less, adult: Secondary | ICD-10-CM | POA: Diagnosis not present

## 2016-08-30 DIAGNOSIS — M542 Cervicalgia: Secondary | ICD-10-CM | POA: Diagnosis not present

## 2016-08-30 DIAGNOSIS — I10 Essential (primary) hypertension: Secondary | ICD-10-CM | POA: Diagnosis not present

## 2016-08-30 DIAGNOSIS — M5021 Other cervical disc displacement,  high cervical region: Secondary | ICD-10-CM | POA: Diagnosis not present

## 2016-08-30 NOTE — Telephone Encounter (Signed)
Husband is refusing for her to continue PT.  She is under going a bunch of test. They want orders to d/c patient.   [place] - [phone]

## 2016-08-30 NOTE — Telephone Encounter (Signed)
Patient's daughter made aware. They will call and make follow up to decide on next steps.

## 2016-08-31 NOTE — Telephone Encounter (Signed)
Ok for this? 

## 2016-08-31 NOTE — Telephone Encounter (Signed)
Dc given

## 2016-09-07 ENCOUNTER — Telehealth: Payer: Self-pay | Admitting: Neurology

## 2016-09-07 NOTE — Telephone Encounter (Signed)
Reviewed records from Dr. [name].  Told her that spinal stenosis not significant and didn't recommend sx.  Sent to PT and RX for hydrocodone

## 2016-09-14 ENCOUNTER — Ambulatory Visit (INDEPENDENT_AMBULATORY_CARE_PROVIDER_SITE_OTHER): Payer: Medicare Other | Admitting: Neurology

## 2016-09-14 ENCOUNTER — Telehealth: Payer: Self-pay | Admitting: Neurology

## 2016-09-14 DIAGNOSIS — G5602 Carpal tunnel syndrome, left upper limb: Secondary | ICD-10-CM

## 2016-09-14 DIAGNOSIS — Z23 Encounter for immunization: Secondary | ICD-10-CM | POA: Diagnosis not present

## 2016-09-14 DIAGNOSIS — M5412 Radiculopathy, cervical region: Secondary | ICD-10-CM

## 2016-09-14 NOTE — Telephone Encounter (Signed)
Patients daughter made aware

## 2016-09-14 NOTE — Telephone Encounter (Signed)
-----   Message from [place], DO sent at 09/14/2016  3:48 PM EDT ----- Let pt/family know that EMG just demonstrated mod CTS

## 2016-09-14 NOTE — Procedures (Signed)
[facility]  [address], [place]  [place], [place] [address] Tel: [phone] Fax:  [phone] Test Date:  09/14/2016  Patient: Tara Neal DOB: Apr 13, 1941 Physician: Narda Amber, DO  Sex: Female Height: 5\' 4"  Ref Phys: Alonza Bogus, D.O.  ID#: [id number] Temp: 33.0C Technician: Jerilynn Mages. Dean   Patient Complaints: This is a 75 year old female referred for evaluation of left arm weakness and paresthesias.  NCV & EMG Findings: Extensive electrodiagnostic testing of the left upper extremity shows: 1. Left median sensory response shows prolonged latency (4.4 ms) and normal amplitude. Left ulnar sensory response is within normal limits. 2. Left median motor response shows prolonged distal onset latency (4.5 ms) and low-normal amplitude. Left ulnar motor responses within normal limits. 3. Chronic motor axon loss changes isolated to the left abductor pollicis brevis muscle, without accompanied active denervation.  Impression: 1. Left median neuropathy at or distal to the wrist, consistent with the clinical diagnosis of carpal tunnel syndrome. Overall, these findings are moderate in degree electrically. 2. There is no evidence of a cervical radiculopathy affecting the left upper extremity.   ___________________________ Narda Amber, DO    Nerve Conduction Studies Anti Sensory Summary Table   Site NR Peak (ms) Norm Peak (ms) P-T Amp (V) Norm P-T Amp  Left Median Anti Sensory (2nd Digit)  Wrist    4.4 <3.8 27.8 >10  Left Ulnar Anti Sensory (5th Digit)  Wrist    3.2 <3.2 8.4 >5   Motor Summary Table   Site NR Onset (ms) Norm Onset (ms) O-P Amp (mV) Norm O-P Amp Site1 Site2 Delta-0 (ms) Dist (cm) Vel (m/s) Norm Vel (m/s)  Left Median Motor (Abd Poll Brev)  Wrist    4.5 <4.0 5.0 >5 Elbow Wrist 3.5 22.0 63 >50  Elbow    8.0  4.6         Left Ulnar Motor (Abd Dig Minimi)  Wrist    3.0 <3.1 7.1 >7 B Elbow Wrist 3.9 21.0 54 >50  B Elbow    6.9  6.5  A Elbow B Elbow 1.5  10.0 67 >50  A Elbow    8.4  6.1          EMG   Side Muscle Ins Act Fibs Psw Fasc Number Recrt Dur Dur. Amp Amp. Poly Poly. Comment  Left 1stDorInt Nml Nml Nml Nml Nml Nml Nml Nml Nml Nml Nml Nml N/A  Left Abd Poll Brev Nml Nml Nml Nml 1- Rapid Some 1+ Some 1+ Nml Nml N/A  Left Ext Indicis Nml Nml Nml Nml Nml Nml Nml Nml Nml Nml Nml Nml N/A  Left PronatorTeres Nml Nml Nml Nml Nml Nml Nml Nml Nml Nml Nml Nml N/A  Left Biceps Nml Nml Nml Nml Nml Nml Nml Nml Nml Nml Nml Nml N/A  Left Triceps Nml Nml Nml Nml Nml Nml Nml Nml Nml Nml Nml Nml N/A  Left Deltoid Nml Nml Nml Nml Nml Nml Nml Nml Nml Nml Nml Nml N/A      Waveforms:

## 2016-09-17 NOTE — Progress Notes (Signed)
Tara Neal was seen today in the movement disorders clinic for neurologic consultation at the request of [name], MD.  This patient is accompanied in the office by her spouse who supplements the history.  The records that were made available to me were reviewed  The consultation is for the evaluation of gait change. Pt states that sx's started 3 weeks ago and states that she had no problems at all before that but husband points out that over a year ago, she had fx of the wrist from a fall.  He thinks that this has been going on for much longer than 3 weeks (estimates slowly for last few years).  Her husband does state that she went to get up from the chair 3 weeks and she felt off balance.  She was "dizzy" but denies vertigo.  Each time she got up she got that feeling; she initially stated that she never had it lying down or seated but then her husband said that she did and she agreed that it was that way but isn't any more.  .The patient saw Dr. [name] on 04/10/15 and was c/o dizziness.  She was dx with OH and her lasix was d/c.  She states that she thinks it helped somewhat but she still gets some dizziness when she stands up but it seemed to help the sitting/laying.  She denies n/v.  No palpitations.  No weakness or paresthesias that lateralize.  She denies speech/swallowing problems but her husband interjects that she "gets choked" sometimes when eating.  Her husband states that the patient believes that she does not walk well because she cannot see well, but she just went to the eye doctor a few weeks ago and her vision was not changed and was unremarkable.  She has an MRI brain pending.  08/11/16 update:  The patient follows up today, accompanied by her husband/daughter who supplements the history.  I have reviewed numerous records.  I have not seen her in about a year.  At last visit, she had evidence of peripheral neuropathy on examination and subsequently had an EMG on 05/06/2015 that  demonstrated severe peripheral neuropathy.  She had a workup for reversible causes.  Her hemoglobin A1c was 5.7, B12 444.  No monoclonal proteins in the urine.  There was slight restricted mobility on her SPEP immunofixation in IgG and lambda regions.  Her folate was normal.  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  She and MRI of the brain that then started white matter disease.  MRA of the brain was negative.  Her MRI of the cervical spine demonstrated diffuse degenerative changes.  Neurosurgical consultation was recommended, She went and I just received records from Dr [name] yesterday.  No surgery was recommended.  The patient reports that she did fairly well over the years until she fell in July, 2017.  She had a repeat MRI that demonstrated an acute/subacute endplate fracture at the L5 region and a chronic fracture at the L3 region.  She subsequently had kyphoplasty by Dr. [name] in August, 2017.  This helped the acute back pain, but she continues to have pain radiating down the left leg.  States that the pain is crampy in nature and will draw the foot back.  Daughter states the bigger concern is  memory change and a problem with her L hand/arm.  Daughter states first episode was in June.  Her husband states that she had trouble moving the L arm.  She could move it but wouldn't and kept it flexed at the chest.  They also noted L facial droop and that lasted 3 days.  She was admitted to the hospital but told that she had a UTI.  No brain neuroimaging was done.  States that it is overall better but she still doesn't use the L arm like she used to.  This is not documented in hospital records but rather L hip pain was noted.  Husband states that several weeks ago, she fell and hit her head and had a "thick tongue" for  several days.  Husband states that she would want to know what she wanted to say.  Daughter reports that sometimes when she has an "off" day, she will stare off and seem to have speech issues.  Daughter concerned about PD or MSA (had a family member recently dx with that)  09/20/16 update:  The patient follows up today, accompanied by her husband/daughter who supplements the history.   She had an EEG and a 48-hour ambulatory EEG since last visit.  Both of these demonstrated diffuse slowing of electrocerebral activity, but no epileptiform activity.  In regards to spells of transient weakness and dysarthria, she states that she has had no further episodes.  MRI of the brain demonstrated only chronic small vessel disease.  She had a repeat MRI of the cervical spine that demonstrated multilevel degenerative changes, central canal stenosis and severe bilateral C3-4 and left C6-7 neural foraminal stenosis. Reviewed records from Dr. [name].   He didn't recommend surgical intervention.  He recommended physical therapy (which she had just stopped) and gave her a prescription for hydrocodone.    EMG done on 09/14/2016 and stated only moderate left carpal tunnel syndrome.  No evidence of cervical radiculopathy on EMG.  No falls but husband walks next to her with a gait belt and walker.  Memory continues to be an issue.  Biggest issue in AM and in late evening.  Sleeping all of the way through the night but husband gives her "a pain pill and gabapentin" and she goes to sleep.  Very repetitive.  No hallucinations.  Sleeps a lot during the day and sleeps more than she is awake.  Is not having diplopia.  Is having dysphagia, with solids and liquids.    ALLERGIES:   Allergies  Allergen Reactions  . Codeine Nausea Only  . Sulfonamide Derivatives Nausea Only    REACTION: Nausea  . Penicillins Rash    .Marland KitchenHas patient had a PCN reaction causing immediate rash, facial/tongue/throat swelling, SOB or lightheadedness with  hypotension: No Has patient had a PCN reaction causing severe rash involving mucus membranes or skin necrosis: No Has patient had a PCN reaction that required hospitalization No Has patient had a PCN reaction occurring within the last 10 years: No If all of the above answers are "NO", then may proceed with Cephalosporin use.   Marland Kitchen Percocet [Oxycodone-Acetaminophen] Itching    CURRENT MEDICATIONS:  Outpatient Encounter Prescriptions as of 09/20/2016  Medication Sig  . acetaminophen (TYLENOL) 325 MG tablet Take 650 mg by mouth at bedtime.  Marland Kitchen alendronate (FOSAMAX) 70 MG tablet TAKE 1 TABLET EVERY 7 DAYS (ON SATURDAYS). TAKE WITH A FULL GLASS OF WATER ON AN EMPTY STOMACH  . Ascorbic Acid (VITAMIN C) 1000 MG tablet Take 1,000 mg  by mouth daily.  Marland Kitchen atenolol (TENORMIN) 50 MG tablet TAKE 1 TABLET EVERY DAY (Patient taking differently: TAKE 1 TABLET PO EVERY DAY)  . benazepril (LOTENSIN) 40 MG tablet TAKE 1 TABLET EVERY DAY (Patient taking differently: TAKE 1 TABLET PO EVERY DAY)  . calcium carbonate (OS-CAL) 600 MG TABS Take 600 mg by mouth 2 (two) times daily with a meal.  . Cholecalciferol (VITAMIN D3) 1000 UNITS CAPS Take 1 capsule by mouth daily.  . fentaNYL (DURAGESIC - DOSED MCG/HR) 25 MCG/HR patch Place 25 mcg onto the skin every 3 (three) days.  . folic acid (FOLVITE) 1 MG tablet Take 1 mg by mouth daily.  . furosemide (LASIX) 40 MG tablet TAKE 1 TABLET EVERY DAY WITH BREAKFAST  . HYDROcodone-acetaminophen (NORCO/VICODIN) 5-325 MG tablet Take 1 tablet by mouth every 8 (eight) hours as needed for moderate pain.  Marland Kitchen latanoprost (XALATAN) 0.005 % ophthalmic solution Place 1 drop into both eyes at bedtime.   . Melatonin 3 MG TABS Take 2 tablets by mouth at bedtime.  . meloxicam (MOBIC) 7.5 MG tablet Take 15 mg by mouth daily.   . methotrexate 250 MG/10ML injection Inject 1 mL (25 mg total) into the skin once a week.  . Multiple Vitamin (MULTIVITAMIN) tablet Take 1 tablet by mouth daily.  .  Omega-3 Fatty Acids (FISH OIL) 1200 MG CAPS Take 2 capsules by mouth daily.   Marland Kitchen omeprazole (PRILOSEC) 20 MG capsule TAKE 2 CAPSULES EVERY DAY  . polyethylene glycol (MIRALAX / GLYCOLAX) packet Take 17 g by mouth daily.  Marland Kitchen senna-docusate (SENOKOT-S) 8.6-50 MG tablet Take 1 tablet by mouth 2 (two) times daily.  . simvastatin (ZOCOR) 40 MG tablet TAKE 1 TABLET EVERY DAY  AT  6PM  . timolol (TIMOPTIC) 0.5 % ophthalmic solution Place 1 drop into both eyes daily.  Marland Kitchen venlafaxine XR (EFFEXOR-XR) 150 MG 24 hr capsule TAKE 1 CAPSULE EVERY DAY WITH BREAKFAST   No facility-administered encounter medications on file as of 09/20/2016.     PAST MEDICAL HISTORY:   Past Medical History:  Diagnosis Date  . Allergy   . Anemia    hx of   . Anxiety   . Chronic insomnia 04/22/2012  . COPD (chronic obstructive pulmonary disease) ([place])   . Diverticulosis of colon   . GERD (gastroesophageal reflux disease)   . Glaucoma   . Hormone replacement therapy (postmenopausal)   . Hyperlipidemia   . Hypertension   . Low back pain   . Migraine headache    denies  . Mild obesity   . Osteoarthritis   . Osteopenia   . Osteoporosis   . Pneumonia    hx of 5 years ago   . Scoliosis   . Shortness of breath    with exertion   . Spontaneous pneumothorax    1968  . Umbilical hernia [id number]    PAST SURGICAL HISTORY:   Past Surgical History:  Procedure Laterality Date  . EXCISION/RELEASE BURSA HIP  04/26/2012   Procedure: EXCISION/RELEASE BURSA HIP;  Surgeon: Gearlean Alf, MD;  Location: [facility];  Service: Orthopedics;  Laterality: Right;  . EYE SURGERY     - bil cataract surg and Retina surgery to left eye  . HERNIA REPAIR  [id number]   umbilical hernia  . HIP ADDUCTOR TENOTOMY    . IR GENERIC HISTORICAL  06/17/2016   IR RADIOLOGIST EVAL & MGMT 06/17/2016 [facility]  . IR GENERIC HISTORICAL  07/01/2016   IR VERTEBROPLASTY LUMBAR BX INC  UNI/BIL INC/INJECT/IMAGING 07/01/2016 Luanne Bras, MD [facility]   . IR GENERIC HISTORICAL  07/16/2016   IR RADIOLOGIST EVAL & MGMT 07/16/2016 [facility]  . LUMBAR DISC SURGERY      x 2  . OTHER SURGICAL HISTORY     benign growth removed from right under ear at age 37   . SHOULDER ARTHROSCOPY     left shoulder  . TONSILLECTOMY    . TONSILLECTOMY    . WRIST FRACTURE SURGERY     plate in left wrist/ fractured 2 times    SOCIAL HISTORY:   Social History   Social History  . Marital status: Married    Spouse name: N/A  . Number of children: N/A  . Years of education: N/A   Occupational History  . Not on file.   Social History Main Topics  . Smoking status: Former Smoker    Years: 20.00    Types: Cigarettes    Quit date: 11/29/1981  . Smokeless tobacco: Never Used  . Alcohol use Yes     Comment: one glass of wine every 3 months  . Drug use: No  . Sexual activity: Not on file   Other Topics Concern  . 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 Mother Deceased   heart disease, DM  . Father Deceased   killed in war  . Son Alive   back problems  . Daughter Alive   breast cancer    ROS:  A complete 10 system review of systems was obtained and was unremarkable apart from what is mentioned above.  PHYSICAL EXAMINATION:    VITALS:   There were no vitals filed for this visit.  GEN:  The patient appears stated age and is in NAD. HEENT:  Normocephalic, atraumatic.  The mucous membranes are moist. The superficial temporal arteries are without ropiness or tenderness. CV:  RRR Lungs:  CTAB Neck/HEME:  There are no carotid bruits bilaterally.  Neurological examination:  Orientation:  Montreal Cognitive Assessment  08/11/2016 04/24/2015  Visuospatial/ Executive (0/5) 1 4  Naming (0/3) 1 2  Attention: Read list of digits (0/2) 2 1  Attention: Read list of letters (0/1) 1 1  Attention: Serial 7 subtraction starting at 100 (0/3) 0 0  Language:  Repeat phrase (0/2) 2 2  Language : Fluency (0/1) 0 0  Abstraction (0/2) 2 2  Delayed Recall (0/5) 3 4  Orientation (0/6) 5 6  Total 17 22  Adjusted Score (based on education) 18 23     Cranial nerves: There is good facial symmetry with the exception of slight decreased nasolabial fold on the right, resulting in minor facial droop around the right lip.  When she activates these muscles with smile, however, there is good facial symmetry.  The patient has decreased upgaze and downgaze today.    She has difficulty participating with formal confrontational testing. The speech is fluent and clear. Soft palate rises symmetrically and there is no tongue deviation. Hearing is intact to conversational tone. Sensation: Sensation is intact to light and pinprick throughout (facial, trunk, extremities). Vibration is decreased in a distal fashion. There is no extinction with double simultaneous stimulation. There is no sensory dermatomal level identified. Motor: Strength is 5/5 in the bilateral upper and lower extremities.   Shoulder shrug is equal and symmetric.  There is no pronator drift.   Deep  tendon reflexes: Deep tendon reflexes are 3/4 at the bilateral biceps, triceps, brachioradialis, patella and 1/4 at the bilateral achilles. Plantar responses are downgoing bilaterally.  There is no Hoffmans or Tromner's response bilaterally.  Movement examination: Tone: There is normal tone in the bilateral upper extremities.  The tone in the lower extremities is normal.  Abnormal movements: None Coordination:  There is no significant decremation with RAM's, with any formal of rapid alternating movements, including alternating supination and pronation of the forearm, hand opening and closing, finger taps, heel taps and toe taps. Gait and Station: The patient has some difficulty arising out of a deep-seated chair without the use of the hands.  She requires 1 person assist out of the wheelchair.  She has on a gait  belt.  She is ataxic and unsteady  Lab Results  Component Value Date   VITAMINB12 444 04/24/2015   Lab Results  Component Value Date   TSH 1.34 03/31/2016   Lab Results  Component Value Date   HGBA1C 5.7 03/31/2016     Chemistry      Component Value Date/Time   NA 137 07/01/2016 0625   K 3.5 07/01/2016 0625   CL 100 (L) 07/01/2016 0625   CO2 28 07/01/2016 0625   BUN 12 07/01/2016 0625   CREATININE 0.90 07/01/2016 0625      Component Value Date/Time   CALCIUM 9.5 07/01/2016 0625   ALKPHOS 69 03/31/2016 1417   AST 19 03/31/2016 1417   ALT 7 03/31/2016 1417   BILITOT 0.5 03/31/2016 1417        ASSESSMENT/PLAN:  1. 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  Now has vertical gaze palsy, ataxia, dysphagia  -will try on low dose levodopa, which likely will need dose escalated if she does tolerate it.  Husband will watch out for any hallucinations or lightheadedness.  -EMG demonstrated severe peripheral neuropathy in June, 2016.  Workup was essentially unremarkable, with the exception of a slight restricted in the IgG and lambda regions.  We will repeat her SPEP with immunofixation next visit (decided to wait on labs for a few weeks).  When I do this, will likely repeat B12 as well  -We will order a modified barium swallow as she is having more difficulty swallowing.  2.  Significant hyperreflexia  -multilevel degenerative changes on MRI cervical spine.  Saw Dr. [name] and felt non surgical in nature.  3.  Spells of transient lateralizing weakness and dysarthria  -EEG with evidence of slowing of electrocerebral activity but no evidence of epileptiform activity.  -Has had no further episodes and decided to hold off on echocardiogram or further testing  4.  Memory change  -pts MoCA has dropped over the year and suspect has developed underlying dementia.  Husband is describing sundowning symptoms.  Did not start further medication, as I am starting levodopa  today.  5.  Low back pain with lumbar radiculopathy and evidence of neural foraminal stenosis and central canal stenosis on the MRI.  -Her back pain is better after kyphoplasty, but continues to have evidence of lumbosacral radiculopathy on the left.  Talked about neurosurgery referral.  Had one previously a year ago and felt nonsurgical so wishes to hold.  6.  Will see her back in the next 6-8 weeks, sooner should new neurologic issues arise.  Greater than 50% of the 25 minute visit was spent in counseling with the patient and her husband.

## 2016-09-20 ENCOUNTER — Encounter: Payer: Self-pay | Admitting: Neurology

## 2016-09-20 ENCOUNTER — Telehealth: Payer: Self-pay | Admitting: Neurology

## 2016-09-20 ENCOUNTER — Ambulatory Visit (INDEPENDENT_AMBULATORY_CARE_PROVIDER_SITE_OTHER): Payer: Medicare Other | Admitting: Neurology

## 2016-09-20 VITALS — BP 128/70 | HR 60

## 2016-09-20 DIAGNOSIS — R1319 Other dysphagia: Secondary | ICD-10-CM | POA: Diagnosis not present

## 2016-09-20 DIAGNOSIS — G231 Progressive supranuclear ophthalmoplegia [Steele-Richardson-Olszewski]: Secondary | ICD-10-CM

## 2016-09-20 MED ORDER — CARBIDOPA-LEVODOPA 25-100 MG PO TABS: 1.0000 | ORAL_TABLET | Freq: Three times a day (TID) | ORAL | 3 refills | Status: AC

## 2016-09-20 NOTE — Telephone Encounter (Signed)
Patient daughter Tara Neal would like to speak to someone please call [phone]

## 2016-09-20 NOTE — Telephone Encounter (Signed)
Spoke with patient's daughter about her visit today and answered her questions. She will call back with any further questions re: diagnosis, treatment, etc.

## 2016-09-20 NOTE — Patient Instructions (Addendum)
1. Start Carbidopa Levodopa as follows:  Take 1/2 tablet three times daily, at least 30 minutes before meals, for one week  Then take 1/2 tablet in the morning, 1/2 tablet in the afternoon, 1 tablet in the evening, at least 30 minutes before meals, for one week  Then take 1/2 tablet in the morning, 1 tablet in the afternoon, 1 tablet in the evening, at least 30 minutes before meals, for one week  Then take 1 tablet three times daily, at least 30 minutes before meals   2.  We will schedule you for a swallow study. We have scheduled you at [facility] for your modified barium swallow on 09/23/16 at 11:30 am. Please arrive 15 minutes prior and go to 1st floor radiology. If you need to reschedule for any reason please call [phone].  3.  I want to see you back in about 6-8 weeks

## 2016-09-23 ENCOUNTER — Ambulatory Visit (HOSPITAL_COMMUNITY): Payer: Medicare Other

## 2016-09-23 ENCOUNTER — Other Ambulatory Visit (HOSPITAL_COMMUNITY): Payer: Self-pay | Admitting: Neurology

## 2016-09-23 DIAGNOSIS — R1319 Other dysphagia: Secondary | ICD-10-CM

## 2016-09-27 ENCOUNTER — Ambulatory Visit (HOSPITAL_COMMUNITY)
Admission: RE | Admit: 2016-09-27 | Discharge: 2016-09-27 | Disposition: A | Discharge status: Home / Self Care | Payer: Medicare Other | Source: Ambulatory Visit | Attending: Neurology | Admitting: Neurology

## 2016-09-27 DIAGNOSIS — F419 Anxiety disorder, unspecified: Secondary | ICD-10-CM | POA: Diagnosis not present

## 2016-09-27 DIAGNOSIS — K219 Gastro-esophageal reflux disease without esophagitis: Secondary | ICD-10-CM | POA: Diagnosis not present

## 2016-09-27 DIAGNOSIS — I10 Essential (primary) hypertension: Secondary | ICD-10-CM | POA: Insufficient documentation

## 2016-09-27 DIAGNOSIS — E785 Hyperlipidemia, unspecified: Secondary | ICD-10-CM | POA: Diagnosis not present

## 2016-09-27 DIAGNOSIS — D649 Anemia, unspecified: Secondary | ICD-10-CM | POA: Diagnosis not present

## 2016-09-27 DIAGNOSIS — J449 Chronic obstructive pulmonary disease, unspecified: Secondary | ICD-10-CM | POA: Diagnosis not present

## 2016-09-27 DIAGNOSIS — M1990 Unspecified osteoarthritis, unspecified site: Secondary | ICD-10-CM | POA: Insufficient documentation

## 2016-09-27 DIAGNOSIS — E669 Obesity, unspecified: Secondary | ICD-10-CM | POA: Diagnosis not present

## 2016-09-27 DIAGNOSIS — R1319 Other dysphagia: Secondary | ICD-10-CM | POA: Diagnosis not present

## 2016-09-27 DIAGNOSIS — E119 Type 2 diabetes mellitus without complications: Secondary | ICD-10-CM | POA: Insufficient documentation

## 2016-09-27 DIAGNOSIS — H409 Unspecified glaucoma: Secondary | ICD-10-CM | POA: Insufficient documentation

## 2016-09-27 DIAGNOSIS — M419 Scoliosis, unspecified: Secondary | ICD-10-CM | POA: Insufficient documentation

## 2016-09-27 DIAGNOSIS — R1310 Dysphagia, unspecified: Secondary | ICD-10-CM | POA: Diagnosis not present

## 2016-09-29 ENCOUNTER — Encounter (HOSPITAL_COMMUNITY): Payer: Self-pay | Admitting: Emergency Medicine

## 2016-09-29 ENCOUNTER — Inpatient Hospital Stay (HOSPITAL_COMMUNITY)
Admission: EM | Admit: 2016-09-29 | Discharge: 2016-10-05 | DRG: 543 | Disposition: A | Discharge status: Skilled Nursing Facility | Payer: Medicare Other | Attending: Internal Medicine | Admitting: Internal Medicine

## 2016-09-29 ENCOUNTER — Emergency Department (HOSPITAL_COMMUNITY): Payer: Medicare Other

## 2016-09-29 DIAGNOSIS — M546 Pain in thoracic spine: Secondary | ICD-10-CM

## 2016-09-29 DIAGNOSIS — M545 Low back pain: Secondary | ICD-10-CM | POA: Diagnosis not present

## 2016-09-29 DIAGNOSIS — S22000A Wedge compression fracture of unspecified thoracic vertebra, initial encounter for closed fracture: Secondary | ICD-10-CM | POA: Diagnosis present

## 2016-09-29 DIAGNOSIS — M549 Dorsalgia, unspecified: Secondary | ICD-10-CM | POA: Diagnosis not present

## 2016-09-29 DIAGNOSIS — M4802 Spinal stenosis, cervical region: Secondary | ICD-10-CM | POA: Diagnosis present

## 2016-09-29 DIAGNOSIS — Z79890 Hormone replacement therapy (postmenopausal): Secondary | ICD-10-CM

## 2016-09-29 DIAGNOSIS — M48061 Spinal stenosis, lumbar region without neurogenic claudication: Secondary | ICD-10-CM | POA: Diagnosis present

## 2016-09-29 DIAGNOSIS — E785 Hyperlipidemia, unspecified: Secondary | ICD-10-CM | POA: Diagnosis present

## 2016-09-29 DIAGNOSIS — Z8249 Family history of ischemic heart disease and other diseases of the circulatory system: Secondary | ICD-10-CM

## 2016-09-29 DIAGNOSIS — Z87891 Personal history of nicotine dependence: Secondary | ICD-10-CM

## 2016-09-29 DIAGNOSIS — M5124 Other intervertebral disc displacement, thoracic region: Secondary | ICD-10-CM | POA: Diagnosis not present

## 2016-09-29 DIAGNOSIS — I10 Essential (primary) hypertension: Secondary | ICD-10-CM | POA: Diagnosis present

## 2016-09-29 DIAGNOSIS — R32 Unspecified urinary incontinence: Secondary | ICD-10-CM | POA: Diagnosis not present

## 2016-09-29 DIAGNOSIS — R05 Cough: Secondary | ICD-10-CM | POA: Diagnosis not present

## 2016-09-29 DIAGNOSIS — B961 Klebsiella pneumoniae [K. pneumoniae] as the cause of diseases classified elsewhere: Secondary | ICD-10-CM | POA: Diagnosis present

## 2016-09-29 DIAGNOSIS — F0390 Unspecified dementia without behavioral disturbance: Secondary | ICD-10-CM | POA: Diagnosis present

## 2016-09-29 DIAGNOSIS — Z833 Family history of diabetes mellitus: Secondary | ICD-10-CM

## 2016-09-29 DIAGNOSIS — N3000 Acute cystitis without hematuria: Secondary | ICD-10-CM | POA: Diagnosis present

## 2016-09-29 DIAGNOSIS — E440 Moderate protein-calorie malnutrition: Secondary | ICD-10-CM | POA: Diagnosis present

## 2016-09-29 DIAGNOSIS — M4854XA Collapsed vertebra, not elsewhere classified, thoracic region, initial encounter for fracture: Secondary | ICD-10-CM | POA: Diagnosis not present

## 2016-09-29 DIAGNOSIS — G20 Parkinson's disease: Secondary | ICD-10-CM | POA: Diagnosis present

## 2016-09-29 DIAGNOSIS — K219 Gastro-esophageal reflux disease without esophagitis: Secondary | ICD-10-CM | POA: Diagnosis present

## 2016-09-29 DIAGNOSIS — M810 Age-related osteoporosis without current pathological fracture: Secondary | ICD-10-CM | POA: Diagnosis present

## 2016-09-29 DIAGNOSIS — E860 Dehydration: Secondary | ICD-10-CM | POA: Diagnosis present

## 2016-09-29 DIAGNOSIS — W19XXXA Unspecified fall, initial encounter: Secondary | ICD-10-CM | POA: Diagnosis present

## 2016-09-29 DIAGNOSIS — M4804 Spinal stenosis, thoracic region: Secondary | ICD-10-CM | POA: Diagnosis present

## 2016-09-29 DIAGNOSIS — R296 Repeated falls: Secondary | ICD-10-CM | POA: Diagnosis present

## 2016-09-29 DIAGNOSIS — J449 Chronic obstructive pulmonary disease, unspecified: Secondary | ICD-10-CM | POA: Diagnosis present

## 2016-09-29 DIAGNOSIS — G8929 Other chronic pain: Secondary | ICD-10-CM | POA: Diagnosis present

## 2016-09-29 DIAGNOSIS — R197 Diarrhea, unspecified: Secondary | ICD-10-CM | POA: Diagnosis not present

## 2016-09-29 DIAGNOSIS — H409 Unspecified glaucoma: Secondary | ICD-10-CM | POA: Diagnosis present

## 2016-09-29 DIAGNOSIS — Z9181 History of falling: Secondary | ICD-10-CM

## 2016-09-29 DIAGNOSIS — M419 Scoliosis, unspecified: Secondary | ICD-10-CM | POA: Diagnosis present

## 2016-09-29 DIAGNOSIS — A040 Enteropathogenic Escherichia coli infection: Secondary | ICD-10-CM | POA: Diagnosis present

## 2016-09-29 DIAGNOSIS — Z79899 Other long term (current) drug therapy: Secondary | ICD-10-CM

## 2016-09-29 DIAGNOSIS — F419 Anxiety disorder, unspecified: Secondary | ICD-10-CM | POA: Diagnosis present

## 2016-09-29 DIAGNOSIS — E876 Hypokalemia: Secondary | ICD-10-CM | POA: Diagnosis present

## 2016-09-29 LAB — COMPREHENSIVE METABOLIC PANEL
ALK PHOS: 68 U/L (ref 38–126)
ALT: 9 U/L — AB (ref 14–54)
ANION GAP: 5 (ref 5–15)
AST: 26 U/L (ref 15–41)
Albumin: 3.6 g/dL (ref 3.5–5.0)
BUN: 14 mg/dL (ref 6–20)
CALCIUM: 8.9 mg/dL (ref 8.9–10.3)
CHLORIDE: 103 mmol/L (ref 101–111)
CO2: 28 mmol/L (ref 22–32)
CREATININE: 0.7 mg/dL (ref 0.44–1.00)
Glucose, Bld: 101 mg/dL — ABNORMAL HIGH (ref 65–99)
Potassium: 4.3 mmol/L (ref 3.5–5.1)
Sodium: 136 mmol/L (ref 135–145)
Total Bilirubin: 0.7 mg/dL (ref 0.3–1.2)
Total Protein: 6.4 g/dL — ABNORMAL LOW (ref 6.5–8.1)

## 2016-09-29 LAB — URINALYSIS, ROUTINE W REFLEX MICROSCOPIC
Bilirubin Urine: NEGATIVE
Glucose, UA: NEGATIVE mg/dL
Hgb urine dipstick: NEGATIVE
Ketones, ur: NEGATIVE mg/dL
LEUKOCYTES UA: NEGATIVE
Nitrite: POSITIVE — AB
PROTEIN: NEGATIVE mg/dL
Specific Gravity, Urine: 1.014 (ref 1.005–1.030)
pH: 7 (ref 5.0–8.0)

## 2016-09-29 LAB — URINE MICROSCOPIC-ADD ON
RBC / HPF: NONE SEEN RBC/hpf (ref 0–5)
Squamous Epithelial / LPF: NONE SEEN
WBC, UA: NONE SEEN WBC/hpf (ref 0–5)

## 2016-09-29 LAB — C DIFFICILE QUICK SCREEN W PCR REFLEX
C DIFFICILE (CDIFF) INTERP: NOT DETECTED
C DIFFICILE (CDIFF) TOXIN: NEGATIVE
C DIFFICLE (CDIFF) ANTIGEN: NEGATIVE

## 2016-09-29 LAB — LIPASE, BLOOD: LIPASE: 20 U/L (ref 11–51)

## 2016-09-29 LAB — I-STAT CG4 LACTIC ACID, ED
LACTIC ACID, VENOUS: 0.83 mmol/L (ref 0.5–1.9)
Lactic Acid, Venous: 0.92 mmol/L (ref 0.5–1.9)

## 2016-09-29 LAB — CBC
HCT: 38 % (ref 36.0–46.0)
Hemoglobin: 12.4 g/dL (ref 12.0–15.0)
MCH: 32.5 pg (ref 26.0–34.0)
MCHC: 32.6 g/dL (ref 30.0–36.0)
MCV: 99.5 fL (ref 78.0–100.0)
PLATELETS: 233 10*3/uL (ref 150–400)
RBC: 3.82 MIL/uL — AB (ref 3.87–5.11)
RDW: 14.2 % (ref 11.5–15.5)
WBC: 8.5 10*3/uL (ref 4.0–10.5)

## 2016-09-29 MED ORDER — FOSFOMYCIN TROMETHAMINE 3 G PO PACK
3.0000 g | PACK | Freq: Once | ORAL | Status: AC
  Administered 2016-09-29: 3 g via ORAL
  Filled 2016-09-29: qty 3

## 2016-09-29 MED ORDER — SODIUM CHLORIDE 0.9 % IV BOLUS (SEPSIS)
1000.0000 mL | Freq: Once | INTRAVENOUS | Status: AC
  Administered 2016-09-29: 1000 mL via INTRAVENOUS

## 2016-09-29 NOTE — ED Provider Notes (Signed)
Patient discussed with Dr.Tegeler. MRI shows compression fracture and retropulsed fragment. Result of spinal stenosis. Patient continues complaining of back pain. Has normal use and sensation of her lower extremities. Continues to have frequent diarrhea with incontinence. I discussed the case with hospitalist. Patient be admitted for fluids pain control PT and OT evaluation.   Tanna Furry, MD 09/29/16 [id number]

## 2016-09-29 NOTE — ED Provider Notes (Signed)
[facility] DEPT Provider Note   CSN: [id number] Arrival date & time: 09/29/16  1046     History   Chief Complaint Chief Complaint  Patient presents with  . Diarrhea    HPI KYMBERLEIGH GAMBRELL is a 75 y.o. female with a past medical history significant for reported Parkinson's and mild dementia, COPD, GERD, diverticulosis, hypertension, hyperlipidemia, and prior lumbar spine fracture status post lumbar injection vertebroplasty who presents with severe diarrhea and back pain. Patient is accompanied by her husband who reports that the patient has had multiple falls over the last few months. He says that several months ago, she had a fall and was found to have lumbar spine fractures. Patient had a vertebroplasty lumbar injection performed after that discovery. She says that since that time, she has had difficulty with her bowel and bladder. She says that she intermittently has complete constipation or complete diarrhea with loss of control of her bowels. She says that she also has had difficulty with urination.   For the last 24 hours, she has had loss of bowel control and he had to change her diaper 9 times overnight. He says that it "just leaks out of her without control". Denies any nausea, vomiting, abdominal pain. She denies any fevers but does report mild chills. She denies dysuria but reports hesitancy.  She enters report that she has had continued back pain in the middle of her back. She denies any falls since one month ago for which she has been following up with her PCP. She denies any chest pain, shortness of breath, or congestion. Husband does report she has had a mild nonproductive cough. Patient denies any recent antibiotic use and has never been tested for C. difficile to her knowledge.     The history is provided by the patient, the spouse and medical records. No language interpreter was used.  Diarrhea   This is a recurrent problem. The current episode started 2 days ago. The  problem occurs more than 10 times per day. The problem has not changed since onset.The stool consistency is described as watery. There has been no fever. Associated symptoms include chills and cough. Pertinent negatives include no abdominal pain, no vomiting, no sweats, no headaches and no URI. She has tried nothing for the symptoms. The treatment provided no relief.    Past Medical History:  Diagnosis Date  . Allergy   . Anemia    hx of   . Anxiety   . Chronic insomnia 04/22/2012  . COPD (chronic obstructive pulmonary disease) ([place])   . Diverticulosis of colon   . GERD (gastroesophageal reflux disease)   . Glaucoma   . Hormone replacement therapy (postmenopausal)   . Hyperlipidemia   . Hypertension   . Low back pain   . Migraine headache    denies  . Mild obesity   . Osteoarthritis   . Osteopenia   . Osteoporosis   . Pneumonia    hx of 5 years ago   . Scoliosis   . Shortness of breath    with exertion   . Spontaneous pneumothorax    1968  . Umbilical hernia [id number]    Patient Active Problem List   Diagnosis Date Noted  . Urinary retention 07/08/2016  . UTI (lower urinary tract infection) 05/15/2016  . Bilateral hand pain 06/05/2015  . Gait disorder 04/15/2015  . Orthostatic hypotension 04/10/2015  . Recurrent falls while walking 04/10/2015  . Memory loss 04/10/2015  . Abnormal x-ray 02/20/2015  .  Cough 11/18/2014  . Thrush, oral 04/04/2014  . Umbilical hernia 0000000  . Benign skin lesion 07/24/2013  . Anemia, unspecified 12/21/2012  . Chronic insomnia 04/22/2012  . Right hip pain 12/13/2011  . Right knee pain 12/13/2011  . Preventative health care 12/11/2011  . DIVERTICULOSIS, COLON 11/18/2010  . ARTERIOVENOUS MALFORMATION, COLON 11/18/2010  . ANXIETY 12/02/2009  . COPD (chronic obstructive pulmonary disease) ([place]) 05/27/2009  . Diabetes ([place]) 02/06/2009  . FATIGUE 12/12/2008  . Hyperlipidemia 10/01/2007  . OBESITY, MILD 10/01/2007  . MIGRAINE  HEADACHE 10/01/2007  . Essential hypertension 10/01/2007  . 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 Pneumothorax 10/01/2007  . GERD 10/01/2007  . Osteoarthritis 10/01/2007  . LOW BACK PAIN 10/01/2007  . OSTEOPOROSIS 10/01/2007    Past Surgical History:  Procedure Laterality Date  . EXCISION/RELEASE BURSA HIP  04/26/2012   Procedure: EXCISION/RELEASE BURSA HIP;  Surgeon: Gearlean Alf, MD;  Location: [facility];  Service: Orthopedics;  Laterality: Right;  . EYE SURGERY     - bil cataract surg and Retina surgery to left eye  . HERNIA REPAIR  [id number]   umbilical hernia  . HIP ADDUCTOR TENOTOMY    . IR GENERIC HISTORICAL  06/17/2016   IR RADIOLOGIST EVAL & MGMT 06/17/2016 [facility]  . IR GENERIC HISTORICAL  07/01/2016   IR VERTEBROPLASTY LUMBAR BX INC UNI/BIL INC/INJECT/IMAGING 07/01/2016 Luanne Bras, MD [facility]  . IR GENERIC HISTORICAL  07/16/2016   IR RADIOLOGIST EVAL & MGMT 07/16/2016 [facility]  . LUMBAR DISC SURGERY      x 2  . OTHER SURGICAL HISTORY     benign growth removed from right under ear at age 73   . SHOULDER ARTHROSCOPY     left shoulder  . TONSILLECTOMY    . TONSILLECTOMY    . WRIST FRACTURE SURGERY     plate in left wrist/ fractured 2 times    OB History    No data available       Home Medications    Prior to Admission medications   Medication Sig Start Date End Date Taking? Authorizing Provider  acetaminophen (TYLENOL) 325 MG tablet Take 650 mg by mouth at bedtime.    Historical Provider, MD  alendronate (FOSAMAX) 70 MG tablet TAKE 1 TABLET EVERY 7 DAYS (ON SATURDAYS). TAKE WITH A FULL GLASS OF WATER ON AN EMPTY STOMACH 02/27/16   Biagio Borg, MD  Ascorbic Acid (VITAMIN C) 1000 MG tablet Take 1,000 mg by mouth daily.    Historical Provider, MD  atenolol (TENORMIN) 50 MG tablet TAKE 1 TABLET EVERY DAY Patient taking differently: TAKE 1 TABLET PO EVERY DAY 03/15/16   Biagio Borg, MD  benazepril (LOTENSIN) 40 MG tablet TAKE 1  TABLET EVERY DAY Patient taking differently: TAKE 1 TABLET PO EVERY DAY 03/15/16   Biagio Borg, MD  calcium carbonate (OS-CAL) 600 MG TABS Take 600 mg by mouth 2 (two) times daily with a meal.    Historical Provider, MD  carbidopa-levodopa (SINEMET IR) 25-100 MG tablet Take 1 tablet by mouth 3 (three) times daily. 09/20/16   Eustace Quail Tat, DO  Cholecalciferol (VITAMIN D3) 1000 UNITS CAPS Take 1 capsule by mouth daily.    Historical Provider, MD  fentaNYL (DURAGESIC - DOSED MCG/HR) 25 MCG/HR patch Place 25 mcg onto the skin every 3 (three) days.    Historical Provider, MD  folic acid (FOLVITE) 1 MG tablet Take 1 mg by mouth daily.    Historical Provider,  MD  furosemide (LASIX) 40 MG tablet TAKE 1 TABLET EVERY DAY WITH BREAKFAST 12/30/15   Biagio Borg, MD  gabapentin (NEURONTIN) 300 MG capsule Take 300 mg by mouth 3 (three) times daily.    Historical Provider, MD  HYDROcodone-acetaminophen (NORCO/VICODIN) 5-325 MG tablet Take 1 tablet by mouth every 8 (eight) hours as needed for moderate pain.    Historical Provider, MD  latanoprost (XALATAN) 0.005 % ophthalmic solution Place 1 drop into both eyes at bedtime.  10/09/13   Historical Provider, MD  Melatonin 3 MG TABS Take 2 tablets by mouth at bedtime.    Historical Provider, MD  meloxicam (MOBIC) 7.5 MG tablet Take 15 mg by mouth daily.  07/19/16   Historical Provider, MD  methotrexate 250 MG/10ML injection Inject 1 mL (25 mg total) into the skin once a week. 05/19/16   Velvet Bathe, MD  Multiple Vitamin (MULTIVITAMIN) tablet Take 1 tablet by mouth daily.    Historical Provider, MD  Omega-3 Fatty Acids (FISH OIL) 1200 MG CAPS Take 2 capsules by mouth daily.     Historical Provider, MD  omeprazole (PRILOSEC) 20 MG capsule TAKE 2 CAPSULES EVERY DAY 03/15/16   Biagio Borg, MD  polyethylene glycol [facility] / Floria Raveling) packet Take 17 g by mouth daily.    Historical Provider, MD  senna-docusate (SENOKOT-S) 8.6-50 MG tablet Take 1 tablet by mouth 2 (two) times  daily.    Historical Provider, MD  simvastatin (ZOCOR) 40 MG tablet TAKE 1 TABLET EVERY DAY  AT  [facility] 03/15/16   Biagio Borg, MD  timolol (TIMOPTIC) 0.5 % ophthalmic solution Place 1 drop into both eyes daily.    Historical Provider, MD  venlafaxine XR (EFFEXOR-XR) 150 MG 24 hr capsule TAKE 1 CAPSULE EVERY DAY WITH BREAKFAST 03/15/16   Biagio Borg, MD    Family History Family History  Problem Relation Age of Onset  . Hyperlipidemia Mother   . Heart disease Mother   . Diabetes Mother     Social History Social History  Substance Use Topics  . Smoking status: Former Smoker    Years: 20.00    Types: Cigarettes    Quit date: 11/29/1981  . Smokeless tobacco: Never Used  . Alcohol use Yes     Comment: one glass of wine every 3 months     Allergies   Codeine; Sulfonamide derivatives; Penicillins; and Percocet [oxycodone-acetaminophen]   Review of Systems Review of Systems  Constitutional: Positive for chills and fatigue. Negative for appetite change, diaphoresis and fever.  HENT: Negative for congestion and rhinorrhea.   Respiratory: Positive for cough. Negative for chest tightness, shortness of breath and wheezing.   Cardiovascular: Negative for chest pain and palpitations.  Gastrointestinal: Positive for diarrhea. Negative for abdominal distention, abdominal pain, anal bleeding, blood in stool, nausea and vomiting.  Genitourinary: Positive for difficulty urinating. Negative for dysuria and flank pain.  Musculoskeletal: Positive for back pain. Negative for neck pain and neck stiffness.  Skin: Negative for rash and wound.  Neurological: Negative for dizziness, light-headedness and headaches.  All other systems reviewed and are negative.    Physical Exam Updated Vital Signs BP 168/92   Pulse 61   Temp 97.6 F (36.4 C) (Oral)   Resp 15   Ht 5\' 4"  (1.626 m)   Wt 130 lb (59 kg)   SpO2 100%   BMI 22.31 kg/m   Physical Exam  Constitutional: She is oriented to person, place,  and time. She appears well-developed and  well-nourished. No distress.  HENT:  Head: Normocephalic and atraumatic.  Mouth/Throat: No oropharyngeal exudate.  Eyes: Conjunctivae and EOM are normal. Pupils are equal, round, and reactive to light.  Neck: Normal range of motion. Neck supple.  Cardiovascular: Normal rate and regular rhythm.   No murmur heard. Pulmonary/Chest: Effort normal and breath sounds normal. No stridor. No respiratory distress. She has no wheezes. She exhibits no tenderness.  Abdominal: Soft. There is no tenderness.  Musculoskeletal: She exhibits tenderness. She exhibits no edema.       Thoracic back: She exhibits tenderness and pain.       Back:  Neurological: She is alert and oriented to person, place, and time. She has normal reflexes. She is not disoriented. She displays no tremor and normal reflexes. No cranial nerve deficit or sensory deficit. She exhibits normal muscle tone. Coordination normal. GCS eye subscore is 4. GCS verbal subscore is 5. GCS motor subscore is 6.  Skin: Skin is warm and dry. Capillary refill takes less than 2 seconds. No rash noted.  Psychiatric: She has a normal mood and affect.  Nursing note and vitals reviewed.    ED Treatments / Results  Labs (all labs ordered are listed, but only abnormal results are displayed) Labs Reviewed  COMPREHENSIVE METABOLIC PANEL - Abnormal; Notable for the following:       Result Value   Glucose, Bld 101 (*)    Total Protein 6.4 (*)    ALT 9 (*)    All other components within normal limits  CBC - Abnormal; Notable for the following:    RBC 3.82 (*)    All other components within normal limits  URINALYSIS, ROUTINE W REFLEX MICROSCOPIC (NOT AT [facility]) - Abnormal; Notable for the following:    APPearance CLOUDY (*)    Nitrite POSITIVE (*)    All other components within normal limits  URINE MICROSCOPIC-ADD ON - Abnormal; Notable for the following:    Bacteria, UA MANY (*)    All other components within  normal limits  URINE CULTURE  C DIFFICILE QUICK SCREEN W PCR REFLEX  LIPASE, BLOOD  I-STAT CG4 LACTIC ACID, ED  I-STAT CG4 LACTIC ACID, ED    EKG  EKG Interpretation None       Radiology Dg Chest 2 View  Result Date: 09/29/2016 CLINICAL DATA:  Cough for 2-3 days EXAM: CHEST  2 VIEW COMPARISON:  05/04/2016 FINDINGS: The There is no focal parenchymal opacity. There is no pleural effusion or pneumothorax. The heart and mediastinal contours are unremarkable. There is a T11 vertebral body compression fracture new compared with 05/15/2016. IMPRESSION: No active cardiopulmonary disease. Age indeterminate T11 vertebral body compression fracture new compared with 05/15/2016. Electronically Signed   By: Kathreen Devoid   On: 09/29/2016 13:35   Ct Thoracic Spine Wo Contrast  Result Date: 09/29/2016 CLINICAL DATA:  Abnormal chest x-ray with T11 compression fracture. Back pain. Multiple recent falls. History of L5 compression fracture status post augmentation. EXAM: CT THORACIC SPINE WITHOUT CONTRAST TECHNIQUE: Multidetector CT imaging of the thoracic spine was performed without intravenous contrast administration. Multiplanar CT image reconstructions were also generated. COMPARISON:  Chest radiographs 09/29/2016. Lumbar spine MRI 06/02/2016. Lumbar spine CT 05/15/2016. FINDINGS: Alignment: Mild lower thoracic dextroscoliosis. Trace anterolisthesis of C7 on T1. Vertebrae: As described on earlier chest radiographs, there is a T11 superior endplate compression fracture with approximately 65% height loss. This is new from the prior MRI and is felt to be recent as the fracture line is still  visible. Retropulsion of the posterior T11 vertebral body cortex measures 5 mm and results in mild spinal stenosis. There is also a new T10 inferior endplate fracture with mild focal depression anteriorly but no significant generalized vertebral body height loss or retropulsion. The bones appear osteopenic. Paraspinal and  other soft tissues: Scarring in the lung apices with mild bronchiectasis in the right upper lobe. Mild centrilobular emphysema. Aortic atherosclerosis. Disc levels: Mild disc degeneration in the lower thoracic spine with degenerative endplate sclerosis. There is a small calcified right paracentral disc protrusion at T10-11 which results in mild right-sided spinal stenosis, present on the prior MRI though better visualized on the current examination. Thoracic facet arthrosis is most advanced at T12-L1 on the left. IMPRESSION: 1. Likely recent T11 vertebral fracture with 65% height loss and 5 mm retropulsion resulting in mild spinal stenosis. 2. T10 inferior endplate fracture without significant height loss. 3. Small right paracentral disc protrusion at T10-11 with mild spinal canal narrowing. Electronically Signed   By: Logan Bores M.D.   On: 09/29/2016 16:00    Procedures Procedures (including critical care time)  Medications Ordered in ED Medications  fosfomycin (MONUROL) packet 3 g (3 g Oral Given 09/29/16 1550)  sodium chloride 0.9 % bolus 1,000 mL (1,000 mLs Intravenous New Bag/Given 09/29/16 1502)     Initial Impression / Assessment and Plan / ED Course  I have reviewed the triage vital signs and the nursing notes.  Pertinent labs & imaging results that were available during my care of the patient were reviewed by me and considered in my medical decision making (see chart for details).  Clinical Course    HOLLICE MAFFUCCI is a 75 y.o. female with a past medical history significant for COPD, GERD, diverticulosis, hypertension, hyperlipidemia, and prior lumbar spine fracture status post lumbar injection vertebroplasty who presents with severe diarrhea and back pain.  History and exam are seen above.  On exam, patient's lungs are clear. Abdomen was nontender. Back was tender in the lumbar and low thoracic areas. Neurologic exam was intact with normal sensation, pulses, and strength in her lower  extremities. No numbness.   Given husband's report of cough, chest x-ray ordered. Chest x-ray showed no evidence of pneumonia however, an ejected determinants thoracic spine compression fracture was seen. Given history of falls, CT was ordered of the thoracic spine to further investigate.  ET scan showed T11 vertebral fracture with 65% height loss and 5 mm of retropulsion. There was also a T10 inferior endplate fracture without height loss.   Laboratory testing showed evidence of urinary tract infection with bacteria, nitrites, and cloudy appearance with no epithelial cells. With some hesitancy, this is felt to be UTI. Patient given fosfomycin.  Laboratory testing otherwise showed normal lipase, unremarkable CMP, and nonelevated lactic acid. CBC showed no leukocytosis or anemia. C. difficile test was ordered. Urine culture was ordered.  Due to patient's back tenderness, report of urinary and bowel symptoms occurring after fall, and new discovery of spine fracture, there is some concern of possible incontinence being related to spinal injury. MRI ordered of lumbar and thoracic spine to further evaluate especially with fall after prior lumbar procedures.  4:44 PM MRI called to report that there is a delay in imaging. Patient will have to wait several hours for collection. Husband reports that he is okay with this as he is concerned. He is concerned about his ability to take care of her tonight due to continued bowel incontinence/diarrhea, history of falls, possible  dehydration and her fatigue.   While awaiting MRI imaging, care transferred to Dr. Jeneen Rinks. Anticipate following up on MR imaging and determining disposition.     Final Clinical Impressions(s) / ED Diagnoses   Final diagnoses:  Incontinence  Incontinence    Clinical Impression: 1. Incontinence   2. Incontinence     Disposition: Transfer to Dr. Jeneen Rinks.   Condition: Georgeanna Lea Tegeler, MD 09/29/16 [phone]

## 2016-09-29 NOTE — ED Notes (Signed)
Pt found alert lying in bed surrounded by family

## 2016-09-29 NOTE — ED Notes (Signed)
RN and NT in patient room performing I&O.

## 2016-09-29 NOTE — ED Notes (Signed)
Per MD patient "Ok" with I&O for urine.  Also, need to collect Cdiff.

## 2016-09-29 NOTE — ED Triage Notes (Signed)
Patient reports diarrhea starting last night. Patient's husband reports she had to change Depends at least 8 times over night. Denies nausea/vomiting. Patient was at [facility] yesterday for a swallowing test.

## 2016-09-29 NOTE — ED Notes (Signed)
Assisted with patient hygiene. Pt had another loose brown stool. Changed incontinence pad and repositioned patient up on stretcher.

## 2016-09-29 NOTE — ED Notes (Signed)
Pt attempted to urinate for sample. Was not able to go.

## 2016-09-29 NOTE — ED Notes (Signed)
MRI called to report that they seen the order but will not be able to scan til 19:00-20:00. Informed Dr. Loletha Grayer Tegeler of this information. He will come speak with patient and family member.

## 2016-09-29 NOTE — ED Notes (Signed)
Pt returned from MRI °

## 2016-09-29 NOTE — ED Notes (Signed)
Bed: WA16 Expected date:  Expected time:  Means of arrival:  Comments: 

## 2016-09-29 NOTE — ED Notes (Signed)
Dr. Loletha Grayer Tegeler has went to speak with patient about the wait. Patient and family member would like to wait for scan.

## 2016-09-29 NOTE — ED Notes (Signed)
Assisted patient to restroom via wheelchair and 2 person assistance. Pt is unsteady on her feet and very weak. Pt continues to have diarrhea. Performed patient hygiene.

## 2016-09-29 NOTE — ED Notes (Signed)
Patient transported to MRI 

## 2016-09-29 NOTE — ED Notes (Signed)
Pts monitor in room alarming  Pulse reading in the 30s per pulse ox  Cardiac monitor applied pulse reading in the 70's irregular

## 2016-09-30 DIAGNOSIS — S22000A Wedge compression fracture of unspecified thoracic vertebra, initial encounter for closed fracture: Secondary | ICD-10-CM | POA: Diagnosis not present

## 2016-09-30 DIAGNOSIS — N3000 Acute cystitis without hematuria: Secondary | ICD-10-CM | POA: Diagnosis present

## 2016-09-30 DIAGNOSIS — R197 Diarrhea, unspecified: Secondary | ICD-10-CM | POA: Diagnosis present

## 2016-09-30 DIAGNOSIS — W19XXXA Unspecified fall, initial encounter: Secondary | ICD-10-CM | POA: Diagnosis present

## 2016-09-30 LAB — GASTROINTESTINAL PANEL BY PCR, STOOL (REPLACES STOOL CULTURE)
ADENOVIRUS F40/41: NOT DETECTED
Astrovirus: NOT DETECTED
CRYPTOSPORIDIUM: NOT DETECTED
CYCLOSPORA CAYETANENSIS: NOT DETECTED
Campylobacter species: NOT DETECTED
E. coli O157: NOT DETECTED
ENTAMOEBA HISTOLYTICA: NOT DETECTED
ENTEROAGGREGATIVE E COLI (EAEC): NOT DETECTED
ENTEROPATHOGENIC E COLI (EPEC): DETECTED — AB
Enterotoxigenic E coli (ETEC): NOT DETECTED
GIARDIA LAMBLIA: NOT DETECTED
Norovirus GI/GII: NOT DETECTED
Plesimonas shigelloides: NOT DETECTED
Rotavirus A: NOT DETECTED
Salmonella species: NOT DETECTED
Sapovirus (I, II, IV, and V): NOT DETECTED
Shiga like toxin producing E coli (STEC): NOT DETECTED
Shigella/Enteroinvasive E coli (EIEC): NOT DETECTED
VIBRIO SPECIES: NOT DETECTED
Vibrio cholerae: NOT DETECTED
YERSINIA ENTEROCOLITICA: NOT DETECTED

## 2016-09-30 LAB — BASIC METABOLIC PANEL
Anion gap: 7 (ref 5–15)
BUN: 15 mg/dL (ref 6–20)
CALCIUM: 8.7 mg/dL — AB (ref 8.9–10.3)
CO2: 26 mmol/L (ref 22–32)
CREATININE: 0.73 mg/dL (ref 0.44–1.00)
Chloride: 101 mmol/L (ref 101–111)
GFR calc Af Amer: >60 mL/min (ref >60)
GFR calc non Af Amer: >60 mL/min (ref >60)
GLUCOSE: 148 mg/dL — AB (ref 65–99)
Potassium: 3.2 mmol/L — ABNORMAL LOW (ref 3.5–5.1)
Sodium: 134 mmol/L — ABNORMAL LOW (ref 135–145)

## 2016-09-30 LAB — MAGNESIUM: Magnesium: 1.8 mg/dL (ref 1.7–2.4)

## 2016-09-30 MED ORDER — DEXTROSE-NACL 5-0.45 % IV SOLN
INTRAVENOUS | Status: DC
  Administered 2016-09-30 – 2016-10-05 (×9): via INTRAVENOUS

## 2016-09-30 MED ORDER — ACETAMINOPHEN 325 MG PO TABS
650.0000 mg | ORAL_TABLET | Freq: Four times a day (QID) | ORAL | Status: DC | PRN
  Administered 2016-09-30: 650 mg via ORAL
  Filled 2016-09-30: qty 2

## 2016-09-30 MED ORDER — LATANOPROST 0.005 % OP SOLN
1.0000 [drp] | Freq: Every day | OPHTHALMIC | Status: DC
  Administered 2016-09-30 – 2016-10-04 (×5): 1 [drp] via OPHTHALMIC
  Filled 2016-09-30: qty 2.5

## 2016-09-30 MED ORDER — PANTOPRAZOLE SODIUM 40 MG PO TBEC
40.0000 mg | DELAYED_RELEASE_TABLET | Freq: Every day | ORAL | Status: DC
  Administered 2016-09-30 – 2016-10-05 (×6): 40 mg via ORAL
  Filled 2016-09-30 (×6): qty 1

## 2016-09-30 MED ORDER — TIMOLOL MALEATE 0.5 % OP SOLN
1.0000 [drp] | Freq: Two times a day (BID) | OPHTHALMIC | Status: DC
  Administered 2016-09-30 – 2016-10-05 (×11): 1 [drp] via OPHTHALMIC
  Filled 2016-09-30: qty 5

## 2016-09-30 MED ORDER — ADULT MULTIVITAMIN W/MINERALS CH
1.0000 | ORAL_TABLET | Freq: Every day | ORAL | Status: DC
  Administered 2016-09-30 – 2016-10-05 (×6): 1 via ORAL
  Filled 2016-09-30 (×6): qty 1

## 2016-09-30 MED ORDER — POLYETHYLENE GLYCOL 3350 17 G PO PACK: 17.0000 g | PACK | Freq: Every day | ORAL | Status: DC | PRN

## 2016-09-30 MED ORDER — ENOXAPARIN SODIUM 40 MG/0.4ML ~~LOC~~ SOLN
40.0000 mg | SUBCUTANEOUS | Status: DC
  Administered 2016-09-30 – 2016-10-05 (×6): 40 mg via SUBCUTANEOUS
  Filled 2016-09-30 (×6): qty 0.4

## 2016-09-30 MED ORDER — HYDROCODONE-ACETAMINOPHEN 5-325 MG PO TABS
1.0000 | ORAL_TABLET | Freq: Three times a day (TID) | ORAL | Status: DC | PRN
Start: 2016-09-30 — End: 2016-10-05
  Administered 2016-09-30: 1 via ORAL
  Filled 2016-09-30: qty 1

## 2016-09-30 MED ORDER — MELATONIN 3 MG PO TABS: 6.0000 | ORAL_TABLET | Freq: Every day | ORAL | Status: DC

## 2016-09-30 MED ORDER — LOPERAMIDE HCL 2 MG PO CAPS
2.0000 mg | ORAL_CAPSULE | ORAL | Status: DC | PRN
  Administered 2016-09-30 (×2): 2 mg via ORAL
  Filled 2016-09-30 (×2): qty 1

## 2016-09-30 MED ORDER — CARBIDOPA-LEVODOPA 25-100 MG PO TABS
1.0000 | ORAL_TABLET | Freq: Three times a day (TID) | ORAL | Status: DC
  Administered 2016-09-30 – 2016-10-05 (×16): 1 via ORAL
  Filled 2016-09-30 (×16): qty 1

## 2016-09-30 MED ORDER — VENLAFAXINE HCL ER 150 MG PO CP24
150.0000 mg | ORAL_CAPSULE | Freq: Every day | ORAL | Status: DC
  Administered 2016-09-30 – 2016-10-05 (×6): 150 mg via ORAL
  Filled 2016-09-30 (×6): qty 1

## 2016-09-30 MED ORDER — ATENOLOL 50 MG PO TABS
50.0000 mg | ORAL_TABLET | Freq: Every day | ORAL | Status: DC
  Administered 2016-09-30 – 2016-10-05 (×5): 50 mg via ORAL
  Filled 2016-09-30 (×5): qty 1
  Filled 2016-09-30: qty 2
  Filled 2016-09-30: qty 1

## 2016-09-30 MED ORDER — CIPROFLOXACIN HCL 500 MG PO TABS
500.0000 mg | ORAL_TABLET | Freq: Two times a day (BID) | ORAL | Status: DC
  Administered 2016-09-30 – 2016-10-05 (×10): 500 mg via ORAL
  Filled 2016-09-30 (×12): qty 1

## 2016-09-30 MED ORDER — VITAMIN C 500 MG PO TABS
1000.0000 mg | ORAL_TABLET | Freq: Every day | ORAL | Status: DC
  Administered 2016-09-30 – 2016-10-05 (×6): 1000 mg via ORAL
  Filled 2016-09-30 (×6): qty 2

## 2016-09-30 MED ORDER — BENAZEPRIL HCL 20 MG PO TABS
40.0000 mg | ORAL_TABLET | Freq: Every day | ORAL | Status: DC
  Administered 2016-09-30 – 2016-10-05 (×6): 40 mg via ORAL
  Filled 2016-09-30: qty 4
  Filled 2016-09-30 (×3): qty 2
  Filled 2016-09-30: qty 4
  Filled 2016-09-30 (×2): qty 2
  Filled 2016-09-30 (×3): qty 4
  Filled 2016-09-30: qty 2

## 2016-09-30 MED ORDER — VITAMIN D 1000 UNITS PO TABS
1000.0000 [IU] | ORAL_TABLET | Freq: Every day | ORAL | Status: DC
  Administered 2016-09-30 – 2016-10-05 (×6): 1000 [IU] via ORAL
  Filled 2016-09-30 (×6): qty 1

## 2016-09-30 MED ORDER — FOLIC ACID 1 MG PO TABS
1.0000 mg | ORAL_TABLET | Freq: Every day | ORAL | Status: DC
  Administered 2016-09-30 – 2016-10-05 (×6): 1 mg via ORAL
  Filled 2016-09-30 (×6): qty 1

## 2016-09-30 MED ORDER — GABAPENTIN 300 MG PO CAPS
300.0000 mg | ORAL_CAPSULE | Freq: Three times a day (TID) | ORAL | Status: DC
  Administered 2016-09-30 – 2016-10-05 (×16): 300 mg via ORAL
  Filled 2016-09-30 (×16): qty 1

## 2016-09-30 MED ORDER — SIMVASTATIN 40 MG PO TABS
40.0000 mg | ORAL_TABLET | Freq: Every day | ORAL | Status: DC
  Administered 2016-09-30 – 2016-10-04 (×5): 40 mg via ORAL
  Filled 2016-09-30 (×4): qty 1
  Filled 2016-09-30: qty 2
  Filled 2016-09-30: qty 1

## 2016-09-30 MED ORDER — ENSURE ENLIVE PO LIQD
237.0000 mL | Freq: Two times a day (BID) | ORAL | Status: DC
  Administered 2016-09-30 – 2016-10-05 (×8): 237 mL via ORAL

## 2016-09-30 MED ORDER — CALCIUM CARBONATE 1250 (500 CA) MG PO TABS
1250.0000 mg | ORAL_TABLET | Freq: Two times a day (BID) | ORAL | Status: DC
  Administered 2016-09-30 – 2016-10-05 (×11): 1250 mg via ORAL
  Filled 2016-09-30 (×11): qty 1

## 2016-09-30 NOTE — Progress Notes (Signed)
Asked to see patient due to back pain from a T10-11 compression fracture.  The patient is here for diarrhea.  She is a patient of Dr. Arlean Hopping.  I have reviewed her imaging with him and he feels she is candidate for VP vs KP as an outpatient once she improves and is discharged from the hospital.  I have sent a message to Dr. Arlean Hopping scheduler who will contact the family to set up this procedure.  I have relayed this to the patient and her daughter.  They are agreeable and happy with this plan.  Tara Neal E 4:03 PM 09/30/2016

## 2016-09-30 NOTE — Progress Notes (Signed)
Since disempacting patient, stool is more formed and occurring less frequently. Will continue to monitor.

## 2016-09-30 NOTE — H&P (Signed)
History and Physical  Tara Neal [id number] DOB: 1941-03-17 DOA: 09/29/2016  PCP:  [name], MD   Chief Complaint:  Diarrhea Pain   History of Present Illness:  Patient is a 75 yo female with hx of chronic back pain , cervical spinal stenosis and foraminal stenosis, lumbar fracture and lumbar spinal stenosis and foraminal stenosis , carpal tunnel syndrome, osteopenia and chronic diarrhea who was brought with cc of worsening back pain and weakness along with worsening diarrhea. She had a recent lumbar fracture (stress) and spent time in rehab then was sent home to do PT which was stopped couple of weeks ago due to frequent falls. She uses a walker with a help from her husband to ambulate. She denied any saddle anesthesia symptoms, or urine incontinence but she has had no control for stool due to frequent diarrhea. Diarrhea worsened over the last two ways, watery and not associated with any nausea, vomiting or abdominal pain. No chest pain, dyspnea or cough. No fever or chills. No trauma to head. No other complaints.   Review of Systems:  CONSTITUTIONAL:     No night sweats.  No fatigue.  No fever. No chills. Eyes:                            No visual changes.  No eye pain.  No eye discharge.   ENT:                              No epistaxis.  No sinus pain.  No sore throat.   No congestion. RESPIRATORY:           No cough.  No wheeze.  No hemoptysis.  No dyspnea CARDIOVASCULAR   :  No chest pains.  No palpitations. GASTROINTESTINAL:  No abdominal pain.  No nausea. No vomiting.  +diarrhea. No                constipation.  No hematemesis.  No hematochezia.  No melena. GENITOURINARY:      No urgency.  No frequency.  No dysuria.  No hematuria.  No                                                obstructive symptoms.  No discharge.  No pain.   MUSCULOSKELETAL:  +musculoskeletal pain.  No joint swelling.  No arthritis. NEUROLOGICAL:        No confusion.  +weakness. No headache. No  seizure. PSYCHIATRIC:             No depression. No anxiety. No suicidal ideation. SKIN:                             No rashes.  No lesions.  No wounds. ENDOCRINE:                No weight loss.  No polydipsia.  No polyuria.  No polyphagia. HEMATOLOGIC:           No purpura.  No petechiae.  No bleeding.  ALLERGIC                 : No pruritus.  No angioedema Other:  Past Medical and Surgical History:  Past Medical History:  Diagnosis Date  . Allergy   . Anemia    hx of   . Anxiety   . Chronic insomnia 04/22/2012  . COPD (chronic obstructive pulmonary disease) ([place])   . Diverticulosis of colon   . GERD (gastroesophageal reflux disease)   . Glaucoma   . Hormone replacement therapy (postmenopausal)   . Hyperlipidemia   . Hypertension   . Low back pain   . Migraine headache    denies  . Mild obesity   . Osteoarthritis   . Osteopenia   . Osteoporosis   . Pneumonia    hx of 5 years ago   . Scoliosis   . Shortness of breath    with exertion   . Spontaneous pneumothorax    1968  . Umbilical hernia [id number]   Past Surgical History:  Procedure Laterality Date  . EXCISION/RELEASE BURSA HIP  04/26/2012   Procedure: EXCISION/RELEASE BURSA HIP;  Surgeon: Gearlean Alf, MD;  Location: [facility];  Service: Orthopedics;  Laterality: Right;  . EYE SURGERY     - bil cataract surg and Retina surgery to left eye  . HERNIA REPAIR  [id number]   umbilical hernia  . HIP ADDUCTOR TENOTOMY    . IR GENERIC HISTORICAL  06/17/2016   IR RADIOLOGIST EVAL & MGMT 06/17/2016 [facility]  . IR GENERIC HISTORICAL  07/01/2016   IR VERTEBROPLASTY LUMBAR BX INC UNI/BIL INC/INJECT/IMAGING 07/01/2016 Luanne Bras, MD [facility]  . IR GENERIC HISTORICAL  07/16/2016   IR RADIOLOGIST EVAL & MGMT 07/16/2016 [facility]  . LUMBAR DISC SURGERY      x 2  . OTHER SURGICAL HISTORY     benign growth removed from right under ear at age 54   . SHOULDER ARTHROSCOPY     left shoulder  . TONSILLECTOMY    .  TONSILLECTOMY    . WRIST FRACTURE SURGERY     plate in left wrist/ fractured 2 times    Social History:   reports that she quit smoking about 34 years ago. Her smoking use included Cigarettes. She quit after 20.00 years of use. She has never used smokeless tobacco. She reports that she drinks alcohol. She reports that she does not use drugs.    Allergies  Allergen Reactions  . Codeine Nausea Only    Can take low doses  . Sulfonamide Derivatives Nausea Only    REACTION: Nausea  . Penicillins Rash    .Marland KitchenHas patient had a PCN reaction causing immediate rash, facial/tongue/throat swelling, SOB or lightheadedness with hypotension: No Has patient had a PCN reaction causing severe rash involving mucus membranes or skin necrosis: No Has patient had a PCN reaction that required hospitalization No Has patient had a PCN reaction occurring within the last 10 years: No If all of the above answers are "NO", then may proceed with Cephalosporin use.   Marland Kitchen Percocet [Oxycodone-Acetaminophen] Itching    Family History  Problem Relation Age of Onset  . Hyperlipidemia Mother   . Heart disease Mother   . Diabetes Mother       Prior to Admission medications   Medication Sig Start Date End Date Taking? Authorizing Provider  acetaminophen (TYLENOL) 325 MG tablet Take 650 mg by mouth at bedtime.   Yes Historical Provider, MD  alendronate (FOSAMAX) 70 MG tablet TAKE 1 TABLET EVERY 7 DAYS (ON SATURDAYS). TAKE WITH A FULL GLASS OF WATER ON AN EMPTY STOMACH Patient taking differently: TAKE 1 TABLET EVERY 7 DAYS (ON  tuesdayS). TAKE WITH A FULL GLASS OF WATER ON AN EMPTY STOMACH 02/27/16  Yes Biagio Borg, MD  Ascorbic Acid (VITAMIN C) 1000 MG tablet Take 1,000 mg by mouth daily.   Yes Historical Provider, MD  atenolol (TENORMIN) 50 MG tablet TAKE 1 TABLET EVERY DAY Patient taking differently: TAKE 50 mg TABLET by mouth once daily 03/15/16  Yes Biagio Borg, MD  benazepril (LOTENSIN) 40 MG tablet TAKE 1 TABLET  EVERY DAY Patient taking differently: TAKE 40mg  TABLET by mouth once daily 03/15/16  Yes Biagio Borg, MD  calcium carbonate (OS-CAL) 600 MG TABS Take 600 mg by mouth 2 (two) times daily with a meal.   Yes Historical Provider, MD  carbidopa-levodopa (SINEMET IR) 25-100 MG tablet Take 1 tablet by mouth 3 (three) times daily. 09/20/16  Yes Rebecca S Tat, DO  Cholecalciferol (VITAMIN D3) 1000 UNITS CAPS Take 1 capsule by mouth daily.   Yes Historical Provider, MD  folic acid (FOLVITE) 1 MG tablet Take 1 mg by mouth daily.   Yes Historical Provider, MD  furosemide (LASIX) 40 MG tablet TAKE 1 TABLET EVERY DAY WITH BREAKFAST Patient taking differently: TAKE 40mg  TABLET by mouth once daily 12/30/15  Yes Biagio Borg, MD  gabapentin (NEURONTIN) 300 MG capsule Take 300 mg by mouth 3 (three) times daily.   Yes Historical Provider, MD  HYDROcodone-acetaminophen (NORCO/VICODIN) 5-325 MG tablet Take 1 tablet by mouth every 8 (eight) hours as needed for moderate pain.   Yes Historical Provider, MD  latanoprost (XALATAN) 0.005 % ophthalmic solution Place 1 drop into both eyes at bedtime.  10/09/13  Yes Historical Provider, MD  Melatonin 3 MG TABS Take 6 tablets by mouth at bedtime.    Yes Historical Provider, MD  meloxicam (MOBIC) 7.5 MG tablet Take 15 mg by mouth daily.  07/19/16  Yes Historical Provider, MD  methotrexate 250 MG/10ML injection Inject 1 mL (25 mg total) into the skin once a week. 05/19/16  Yes Velvet Bathe, MD  Multiple Vitamin (MULTIVITAMIN) tablet Take 1 tablet by mouth daily.   Yes Historical Provider, MD  Omega-3 Fatty Acids (FISH OIL) 1200 MG CAPS Take 2 capsules by mouth daily.    Yes Historical Provider, MD  omeprazole (PRILOSEC) 20 MG capsule TAKE 2 CAPSULES EVERY DAY Patient taking differently: TAKE 40mg  CAPSULES by mouth once daily 03/15/16  Yes Biagio Borg, MD  polyethylene glycol [facility] / Floria Raveling) packet Take 17 g by mouth daily as needed for moderate constipation.    Yes Historical  Provider, MD  simvastatin (ZOCOR) 40 MG tablet TAKE 1 TABLET EVERY DAY  AT  6PM Patient taking differently: TAKE 40mg  TABLET by mouth EVERY morning 03/15/16  Yes Biagio Borg, MD  timolol (TIMOPTIC) 0.5 % ophthalmic solution Place 1 drop into both eyes 2 (two) times daily.    Yes Historical Provider, MD  venlafaxine XR (EFFEXOR-XR) 150 MG 24 hr capsule TAKE 1 CAPSULE EVERY DAY WITH BREAKFAST Patient taking differently: TAKE 150mg  CAPSULE by mouth EVERY DAY WITH BREAKFAST 03/15/16  Yes Biagio Borg, MD    Physical Exam: BP 168/73 (BP Location: Left Arm)   Pulse (!) 37   Temp 98.7 F (37.1 C) (Oral)   Resp 17   Ht 5\' 4"  (1.626 m)   Wt 59 kg (130 lb)   SpO2 100%   BMI 22.31 kg/m   GENERAL :   Alert and cooperative, and appears to be in no acute distress. HEAD:  normocephalic. EYES:            PERRL, EOMI.  EARS:           hearing grossly intact. NOSE:           No nasal discharge. THROAT:     Oral cavity and pharynx normal.   NECK:          supple CARDIAC:    Normal S1 and S2. No gallop. No murmurs.  Vascular:     no peripheral edema.  LUNGS:       Clear to auscultation  ABDOMEN: Positive bowel sounds. Soft, nondistended, nontender. No guarding or rebound.      MSK:           Tenderness on thoracic vertebrae palpation  EXT           : No significant deformity or joint abnormality. Neuro        : Alert, oriented to person, place, and time.                      CN II-XII intact.                       Strength and sensation symmetric and intact throughout.   SKIN:            No rash. No lesions. PSYCH:       No hallucination. Patient is not suicidal.          Labs on Admission:  Reviewed.   Radiological Exams on Admission: Dg Chest 2 View  Result Date: 09/29/2016 CLINICAL DATA:  Cough for 2-3 days EXAM: CHEST  2 VIEW COMPARISON:  05/04/2016 FINDINGS: The There is no focal parenchymal opacity. There is no pleural effusion or pneumothorax. The heart and mediastinal  contours are unremarkable. There is a T11 vertebral body compression fracture new compared with 05/15/2016. IMPRESSION: No active cardiopulmonary disease. Age indeterminate T11 vertebral body compression fracture new compared with 05/15/2016. Electronically Signed   By: Kathreen Devoid   On: 09/29/2016 13:35   Ct Thoracic Spine Wo Contrast  Result Date: 09/29/2016 CLINICAL DATA:  Abnormal chest x-ray with T11 compression fracture. Back pain. Multiple recent falls. History of L5 compression fracture status post augmentation. EXAM: CT THORACIC SPINE WITHOUT CONTRAST TECHNIQUE: Multidetector CT imaging of the thoracic spine was performed without intravenous contrast administration. Multiplanar CT image reconstructions were also generated. COMPARISON:  Chest radiographs 09/29/2016. Lumbar spine MRI 06/02/2016. Lumbar spine CT 05/15/2016. FINDINGS: Alignment: Mild lower thoracic dextroscoliosis. Trace anterolisthesis of C7 on T1. Vertebrae: As described on earlier chest radiographs, there is a T11 superior endplate compression fracture with approximately 65% height loss. This is new from the prior MRI and is felt to be recent as the fracture line is still visible. Retropulsion of the posterior T11 vertebral body cortex measures 5 mm and results in mild spinal stenosis. There is also a new T10 inferior endplate fracture with mild focal depression anteriorly but no significant generalized vertebral body height loss or retropulsion. The bones appear osteopenic. Paraspinal and other soft tissues: Scarring in the lung apices with mild bronchiectasis in the right upper lobe. Mild centrilobular emphysema. Aortic atherosclerosis. Disc levels: Mild disc degeneration in the lower thoracic spine with degenerative endplate sclerosis. There is a small calcified right paracentral disc protrusion at T10-11 which results in mild right-sided spinal stenosis, present on the prior MRI though better visualized on the current examination.  Thoracic facet arthrosis  is most advanced at T12-L1 on the left. IMPRESSION: 1. Likely recent T11 vertebral fracture with 65% height loss and 5 mm retropulsion resulting in mild spinal stenosis. 2. T10 inferior endplate fracture without significant height loss. 3. Small right paracentral disc protrusion at T10-11 with mild spinal canal narrowing. Electronically Signed   By: Logan Bores M.D.   On: 09/29/2016 16:00   Mr Thoracic Spine Wo Contrast  Result Date: 09/29/2016 CLINICAL DATA:  Initial evaluation for acute back pain, history of multiple recent falls. EXAM: MRI  THORACIC AND LUMBAR SPINE WITHOUT CONTRAST TECHNIQUE: Multiplanar and multiecho pulse sequences of the thoracic and lumbar spine, were obtained without intravenous contrast. COMPARISON:  Comparison made with prior CT from earlier the same day. S the E FINDINGS: MRI THORACIC SPINE FINDINGS Alignment: Accentuation of the normal thoracic kyphosis. Mild dextroscoliosis of the lower thoracic spine. Vertebrae: Abnormal T1 hypointense, stir hyperintense signal intensity extends through the inferior endplate of T1, compatible with acute compression fracture. Minimal height loss of up to 25%. No significant bony retropulsion. There is an additional acute compression fracture involving the T11 vertebral body. Associated height loss of approximately 65%. Bony retropulsion measures 5 mm. Resultant mild spinal canal stenosis. Suspected associated fracture of the T11 spinous process which demonstrates marrow edema, with surrounding edema within the adjacent soft tissues (series 10, image 40). Vertebral body heights otherwise maintained. No other acute fracture identified. Minimal STIR signal intensity within the T5 vertebral body favored to be related to reactive endplate changes. Signal intensity within the vertebral body bone marrow otherwise within normal limits. Benign hemangioma noted within the T12 and L1 vertebral body. No worrisome osseous lesions.  Cord: Signal intensity within the thoracic spinal cord is normal. No evidence for traumatic cord injury. Paraspinal and other soft tissues: Paraspinous soft tissues demonstrate no acute abnormality. Small layering left pleural effusion noted. Disc levels: Limited views of the cervical spine on counter sequence demonstrate reversal of the normal cervical lordosis with multilevel degenerative spondylolysis. Scattered multilevel facet hypertrophy noted throughout much of the upper and mid thoracic spine. Minimal disc bulge at T2-3 and T8-9. No other significant disc bulging through the T9-10 level. No focal disc protrusions. No canal or foraminal stenosis from the T1-2 level through the T9-10 level. T10-11: Diffuse disc bulge with disc desiccation and intervertebral disc space narrowing. Superimposed right paracentral disc protrusion indents and flattens the ventral thecal sac with mild flattening of the right hemi cord (series 9, image 35). No cord signal changes. Superimposed posterior element hypertrophy. Mild canal and bilateral foraminal stenosis. T11-12: 5 mm bony retropulsion related to the T11 compression fracture. Retro falls ball lung indents the ventral thecal sac results in mild canal stenosis (series 9, image 38). Superimposed mild facet hypertrophy. No significant foraminal encroachment. T12-L1: Diffuse degenerative disc bulge with disc desiccation. Superimposed left paracentral disc protrusion mildly flattens and indents the ventral thecal sac without significant canal stenosis (series 9, image 44). No significant foraminal narrowing. Mild edema about the left T11-12 facet at favored to be degenerative in nature. No definite fracture through the facet itself MRI LUMBAR SPINE FINDINGS Segmentation: Normal segmentation. Lowest well-formed disc is labeled the L5-S1 level. Alignment: 40 degrees of levoscoliosis with apex at L3 again seen, stable. Mild retrolisthesis of L1 on L2 and L2 on L3 also unchanged.  Trace anterolisthesis of L5 on S1 also stable. Vertebrae: Chronic degenerative endplate Schmorl's node with associated height loss at the superior endplate of L3 again noted, stable. Since the prior  study, there has been interval performance of vertebral augmentation at the L5 level. Vertebral body height at this level it is relatively stable. Known progressive fragmentation or bony retropulsion. Previously seen edema within the inferior endplate of L5 has largely resolved. Acute fractures involving the T10 and T11 vertebral bodies noted, described on the thoracic portion of this exam. Vertebral body heights otherwise maintained. No other acute fracture. Prominent benign hemangioma again noted at T12 and L1. No worrisome osseous lesions. Signal intensity within the vertebral body bone marrow otherwise within normal limits. Conus medullaris: Extends to the L1-2 level and appears normal. Paraspinal and other soft tissues: Paraspinous soft tissues demonstrate no acute abnormality. Fatty atrophy noted within the paraspinous musculature. Cholelithiasis noted. Visualized visceral structures otherwise grossly unremarkable. Disc levels: L1-2: Trace retrolisthesis. Diffuse degenerative disc bulge with facet hypertrophy. No significant canal stenosis. Mild bilateral foraminal stenosis. L2-3: Trace retrolisthesis. Diffuse degenerative disc bulge with moderate bilateral facet arthrosis with ligamentum flavum hypertrophy. Moderate to severe canal stenosis and right lateral recess stenosis is stable. Fairly severe bilateral foraminal narrowing also unchanged, worse on the right. L3-4: Diffuse degenerative disc bulge with advanced bilateral facet arthrosis, right greater than left. Moderate canal stenosis, stable. Severe right with more mild left foraminal narrowing also stable. L4-5: Diffuse degenerative disc bulge with advanced facet arthrosis. Moderate canal stenosis, stable. Left greater than right lateral recess narrowing.  Mild right with more moderate left foraminal stenosis. L5-S1: Degenerative disc desiccation with mild disc bulge. Severe facet arthrosis. 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 No significant canal stenosis. Severe left foraminal stenosis with probable compression of the left L5 nerve root again noted. More mild to moderate right foraminal stenosis. No complication identified status post recent vertebral augmentation. IMPRESSION: MRI THORACIC IMPRESSION: 1. Acute T11 compression fracture with 65% height loss and 5 mm bony retropulsion. Resultant mild canal stenosis. 2. Acute compression fracture through the inferior endplate of [id number] with mild 20% height loss without bony retropulsion. 3. No evidence for traumatic cord injury within the thoracic spine. 4. Degenerative disc bulge with right paracentral disc protrusion at T10-11 with resultant mild canal stenosis. 5. Small layering left pleural effusion. MRI LUMBAR IMPRESSION: 1. Sequelae of interval vertebral augmentation at L5 without complication. 2. No other acute traumatic injury within the lumbar spine. Stable chronic L3 fracture. 3. 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 4. Moderate to severe spinal stenosis at L2-3, with more moderate canal narrowing at L3-4 and L4-5. 5. Severe left foraminal stenosis at L5-S1 with compression of the L5 nerve root. 6. Severe bilateral foraminal stenosis at L2-3, with severe right foraminal stenosis at L3-4. Electronically Signed   By: Jeannine Boga M.D.   On: 09/29/2016 22:52   Mr Lumbar Spine Wo Contrast  Result Date: 09/29/2016 CLINICAL DATA:  Initial evaluation for acute back pain, history of multiple recent falls. EXAM: MRI  THORACIC AND LUMBAR SPINE WITHOUT CONTRAST TECHNIQUE: Multiplanar and multiecho pulse sequences of the thoracic and lumbar spine, were obtained without  intravenous contrast. COMPARISON:  Comparison made with prior CT from earlier the same day. S the E FINDINGS: MRI THORACIC SPINE FINDINGS Alignment: Accentuation of the normal thoracic kyphosis. Mild dextroscoliosis of the lower thoracic spine. Vertebrae: Abnormal T1 hypointense, stir hyperintense signal intensity extends through the inferior endplate of T1, compatible with acute compression fracture. Minimal height loss of up to 25%. No significant bony retropulsion. There is an additional  acute compression fracture involving the T11 vertebral body. Associated height loss of approximately 65%. Bony retropulsion measures 5 mm. Resultant mild spinal canal stenosis. Suspected associated fracture of the T11 spinous process which demonstrates marrow edema, with surrounding edema within the adjacent soft tissues (series 10, image 40). Vertebral body heights otherwise maintained. No other acute fracture identified. Minimal STIR signal intensity within the T5 vertebral body favored to be related to reactive endplate changes. Signal intensity within the vertebral body bone marrow otherwise within normal limits. Benign hemangioma noted within the T12 and L1 vertebral body. No worrisome osseous lesions. Cord: Signal intensity within the thoracic spinal cord is normal. No evidence for traumatic cord injury. Paraspinal and other soft tissues: Paraspinous soft tissues demonstrate no acute abnormality. Small layering left pleural effusion noted. Disc levels: Limited views of the cervical spine on counter sequence demonstrate reversal of the normal cervical lordosis with multilevel degenerative spondylolysis. Scattered multilevel facet hypertrophy noted throughout much of the upper and mid thoracic spine. Minimal disc bulge at T2-3 and T8-9. No other significant disc bulging through the T9-10 level. No focal disc protrusions. No canal or foraminal stenosis from the T1-2 level through the T9-10 level. T10-11: Diffuse disc bulge  with disc desiccation and intervertebral disc space narrowing. Superimposed right paracentral disc protrusion indents and flattens the ventral thecal sac with mild flattening of the right hemi cord (series 9, image 35). No cord signal changes. Superimposed posterior element hypertrophy. Mild canal and bilateral foraminal stenosis. T11-12: 5 mm bony retropulsion related to the T11 compression fracture. Retro falls ball lung indents the ventral thecal sac results in mild canal stenosis (series 9, image 38). Superimposed mild facet hypertrophy. No significant foraminal encroachment. T12-L1: Diffuse degenerative disc bulge with disc desiccation. Superimposed left paracentral disc protrusion mildly flattens and indents the ventral thecal sac without significant canal stenosis (series 9, image 44). No significant foraminal narrowing. Mild edema about the left T11-12 facet at favored to be degenerative in nature. No definite fracture through the facet itself MRI LUMBAR SPINE FINDINGS Segmentation: Normal segmentation. Lowest well-formed disc is labeled the L5-S1 level. Alignment: 40 degrees of levoscoliosis with apex at L3 again seen, stable. Mild retrolisthesis of L1 on L2 and L2 on L3 also unchanged. Trace anterolisthesis of L5 on S1 also stable. Vertebrae: Chronic degenerative endplate Schmorl's node with associated height loss at the superior endplate of L3 again noted, stable. Since the prior study, there has been interval performance of vertebral augmentation at the L5 level. Vertebral body height at this level it is relatively stable. Known progressive fragmentation or bony retropulsion. Previously seen edema within the inferior endplate of L5 has largely resolved. Acute fractures involving the T10 and T11 vertebral bodies noted, described on the thoracic portion of this exam. Vertebral body heights otherwise maintained. No other acute fracture. Prominent benign hemangioma again noted at T12 and L1. No worrisome  osseous lesions. Signal intensity within the vertebral body bone marrow otherwise within normal limits. Conus medullaris: Extends to the L1-2 level and appears normal. Paraspinal and other soft tissues: Paraspinous soft tissues demonstrate no acute abnormality. Fatty atrophy noted within the paraspinous musculature. Cholelithiasis noted. Visualized visceral structures otherwise grossly unremarkable. Disc levels: L1-2: Trace retrolisthesis. Diffuse degenerative disc bulge with facet hypertrophy. No significant canal stenosis. Mild bilateral foraminal stenosis. L2-3: Trace retrolisthesis. Diffuse degenerative disc bulge with moderate bilateral facet arthrosis with ligamentum flavum hypertrophy. Moderate to severe canal stenosis and right lateral recess stenosis is stable. Fairly severe bilateral foraminal narrowing also  unchanged, worse on the right. L3-4: Diffuse degenerative disc bulge with advanced bilateral facet arthrosis, right greater than left. Moderate canal stenosis, stable. Severe right with more mild left foraminal narrowing also stable. L4-5: Diffuse degenerative disc bulge with advanced facet arthrosis. Moderate canal stenosis, stable. Left greater than right lateral recess narrowing. Mild right with more moderate left foraminal stenosis. L5-S1: Degenerative disc desiccation with mild disc bulge. Severe facet arthrosis. 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 No significant canal stenosis. Severe left foraminal stenosis with probable compression of the left L5 nerve root again noted. More mild to moderate right foraminal stenosis. No complication identified status post recent vertebral augmentation. IMPRESSION: MRI THORACIC IMPRESSION: 1. Acute T11 compression fracture with 65% height loss and 5 mm bony retropulsion. Resultant mild canal stenosis. 2. Acute compression fracture through the inferior endplate of [id number] with mild 20% height loss without bony retropulsion. 3. No evidence  for traumatic cord injury within the thoracic spine. 4. Degenerative disc bulge with right paracentral disc protrusion at T10-11 with resultant mild canal stenosis. 5. Small layering left pleural effusion. MRI LUMBAR IMPRESSION: 1. Sequelae of interval vertebral augmentation at L5 without complication. 2. No other acute traumatic injury within the lumbar spine. Stable chronic L3 fracture. 3. 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 4. Moderate to severe spinal stenosis at L2-3, with more moderate canal narrowing at L3-4 and L4-5. 5. Severe left foraminal stenosis at L5-S1 with compression of the L5 nerve root. 6. Severe bilateral foraminal stenosis at L2-3, with severe right foraminal stenosis at L3-4. Electronically Signed   By: Jeannine Boga M.D.   On: 09/29/2016 22:52      Assessment/Plan  Compression fracture T11 with mild canal stenosis / compression fracture for T10 endplate with out bony retropulsion: Acute No evidence of cord injury Continue pain management , PT consult in am, will notify her neurosurgeon in am ( not candidate for surgery per prior neurology notes).    Patient also has severe DJD, mod to severe spinal stenosis L2-3/moderate canal narrowing L3-4/L4-5, severe foraminal stenosis L5-S1 in the left and bilateral foraminal stenosis at L2-3 and right forminal stenosis L3-4. She had previous cervical MRI that showed spinal stenosis and foraminal stenosis as well.    Diarrhea:  C.diff neg No recent abx use No abd pain/vomiting and unremarkable abd exam Side effect from medication is possible Hold lasix Continue IVF 75 cc/hr Loperamide as needed   Input & Output: ordered  Lines & Tubes: PIV DVT prophylaxis:  [place] enoxaparin  GI prophylaxis: na Consultants: PT Code Status: full Family Communication: husband at bedside  Disposition Plan: Medsurg    Gennaro Africa M.D [facility]

## 2016-09-30 NOTE — Care Management CC44 (Signed)
Condition Code 44 Documentation Completed  Patient Details  Name: Tara Neal MRN: [id number] Date of Birth: [birth year]   Condition Code 44 given:  Yes Patient signature on Condition Code 44 notice:  Yes Documentation of 2 MD's agreement:  Yes (per Caren Griffins of [place]) Code 44 added to claim:  Yes    Dellie Catholic, RN 09/30/2016, 11:04 AM

## 2016-09-30 NOTE — Progress Notes (Signed)
PHARMACIST - PHYSICIAN ORDER COMMUNICATION  CONCERNING: P&T Medication Policy on Herbal Medications  DESCRIPTION:  This patient's order for:  melatonin  has been noted.  This product(s) is classified as an "herbal" or natural product. Due to a lack of definitive safety studies or FDA approval, nonstandard manufacturing practices, plus the potential risk of unknown drug-drug interactions while on inpatient medications, the Pharmacy and Therapeutics Committee does not permit the use of "herbal" or natural products of this type within [facility].   ACTION TAKEN: The pharmacy department is unable to verify this order at this time and your patient has been informed of this safety policy. Please reevaluate patient's clinical condition at discharge and address if the herbal or natural product(s) should be resumed at that time.   Thanks Dorrene German 09/30/2016 1:50 AM

## 2016-09-30 NOTE — Progress Notes (Signed)
Upon examination of patient, I noticed that patient was impacted. Performed a digital disempaction and pulled out a significant amount of hard, clay-like stool.

## 2016-09-30 NOTE — Progress Notes (Signed)
Initial Nutrition Assessment  DOCUMENTATION CODES:   Non-severe (moderate) malnutrition in context of acute illness/injury  INTERVENTION:   Provide Ensure Enlive po BID, each supplement provides 350 kcal and 20 grams of protein Encourage PO intake RD to continue to monitor for needs  NUTRITION DIAGNOSIS:   Inadequate oral intake related to lethargy/confusion as evidenced by meal completion < 50%.  GOAL:   Patient will meet greater than or equal to 90% of their needs   MONITOR:   PO intake, Supplement acceptance, Labs, Weight trends, I & O's  REASON FOR ASSESSMENT:   Malnutrition Screening Tool    ASSESSMENT:   75 yo female with hx of chronic back pain , cervical spinal stenosis and foraminal stenosis, lumbar fracture and lumbar spinal stenosis and foraminal stenosis , carpal tunnel syndrome, osteopenia and chronic diarrhea who was brought with cc of worsening back pain and weakness along with worsening diarrhea  Patient unable to provide nutritional history at this time given confusion. No family present at bedside. Pt with chronic diarrhea, per chart review had 15 episodes of diarrhea overnight. Pt on IVF. Pt eating 50% of meals at this time, on regular diet. Per weight history, pt has lost 9 lb since December 2016, insignificant for time frame. Pt would benefit from nutritional supplements while only consuming half of meals, RD to order. Pt with noticeable mild-moderate depletion in temporal, orbital, hand and clavicle regions.    Labs reviewed. Medications: OS-CAL tablet BID, Vitamin D tablet daily, Folic acid tablet daily, Multivitamin with minerals daily, Vitamin C tablet daily, D5-.45% NaCl infusion at 75 ml/hr- provides 306 kcal  Diet Order:  Diet regular Room service appropriate? Yes; Fluid consistency: Thin  Skin:  Reviewed, no issues  Last BM:  11/2  Height:   Ht Readings from Last 1 Encounters:  09/29/16 5\' 4"  (1.626 m)    Weight:   Wt Readings from  Last 1 Encounters:  09/29/16 130 lb (59 kg)    Ideal Body Weight:  54.5 kg  BMI:  Body mass index is 22.31 kg/m.  Estimated Nutritional Needs:   Kcal:  1500-1700  Protein:  65-75g  Fluid:  1.5-1.7L/day  EDUCATION NEEDS:   No education needs identified at this time  Clayton Bibles, MS, RD, LDN Pager: [phone] After Hours Pager: [phone]

## 2016-09-30 NOTE — Progress Notes (Signed)
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    Nurse who received alert:  S.Vennable RN  MD notified (1st page):  NP Kirby-Graham  Time of first page:  2016  MD notified (2nd page):  Time of second page:  Responding MD:  NP Baltazar Najjar  Time MD responded: 8:29pm orders placed in chart

## 2016-09-30 NOTE — Progress Notes (Addendum)
[facility] PROGRESS NOTE  Tara Neal [id number] DOB: Jan 21, 1941 DOA: 09/29/2016   PCP: [name], MD     Assessment/Plan: Active Problems:   Thoracic compression fracture [facility])   Acute diarrhea   75 yo female with hx of chronic back pain , cervical spinal stenosis and foraminal stenosis, lumbar fracture and lumbar spinal stenosis and foraminal stenosis , carpal tunnel syndrome, osteopenia and chronic diarrhea who was brought with cc of worsening back pain and weakness along with worsening diarrhea  Assessment and plan Acute Compression fracture T11 with mild canal stenosis / compression fracture for T10 endplate with out bony retropulsion: No evidence of cord injury Requested IR consult for kyphoplasty   Gait instability Patient has a history of movement disorder and is followed by [facility]  neurology Recently evaluated with EEG, nerve conduction study Recently diagnosed with left carpal tunnel MRI of the brain 08/18/16 did not show any acute abnormality but chronic microvascular ischemia EEG 9/18 showed mild-to-moderate diffuse slowing, no epileptiform activity Will monitor patient on telemetry, although I doubt that patient has any cardiac issues/arrhythmia is contributing to her falls I don't see any evidence of UTI [status post receiving fosfomycin] Continue Sinemet Monitor on tele due to hx of recurrent falls     chronic back pain  Patient also has severe DJD, mod to severe spinal stenosis L2-3/moderate canal narrowing L3-4/L4-5, severe foraminal stenosis L5-S1 in the left and bilateral foraminal stenosis at L2-3 and right forminal stenosis L3-4. She had previous cervical MRI that showed spinal stenosis and foraminal stenosis as well.    Diarrhea: 15 episodes of diarrhea overnight C.diff neg, GI pathogen panel pending No recent abx use No abd pain/vomiting and unremarkable abd exam Would continue with IV fluids overnight, along with D5 and  potassium     DVT prophylaxsis Lovenox  Code Status:  Full code      Family Communication: Discussed in detail with the patient, all imaging results, lab results explained to the patient   Disposition Plan:  I tried reaching the husband, both phone numbers which are listed are not working Transfer patient to telemetry given recurrent falls     Consultants:  Interventional radiology  Procedures:  None  Antibiotics: Anti-infectives    None         HPI/Subjective: Patient confused unable to give me any history, per RN patient has had 15 bowel movements since admission  Objective: Vitals:   09/29/16 2215 09/30/16 0019 09/30/16 0550 09/30/16 0912  BP: 168/73 157/91 138/71 (!) 150/95  Pulse: (!) 37 70 65 75  Resp: 17 17 16    Temp:   98 F (36.7 C)   TempSrc:   Oral   SpO2: 100% 99% 99%   Weight:      Height:        Intake/Output Summary (Last 24 hours) at 09/30/16 1048 Last data filed at 09/30/16 0936  Gross per 24 hour  Intake          1493.75 ml  Output              300 ml  Net          1193.75 ml    Exam:  Examination:  General exam: Appears calm and comfortable  Respiratory system: Clear to auscultation. Respiratory effort normal. Cardiovascular system: S1 & S2 heard, RRR. No JVD, murmurs, rubs, gallops or clicks. No pedal edema. Gastrointestinal system: Abdomen is nondistended, soft and nontender. No organomegaly or masses  felt. Normal bowel sounds heard. Central nervous system:Confused. No focal neurological deficits. Extremities: Symmetric 5 x 5 power. Skin: No rashes, lesions or ulcers Psychiatry: Judgement and insight appear normal. Mood & affect appropriate.     Data Reviewed: I have personally reviewed following labs and imaging studies  Micro Results Recent Results (from the past 240 hour(s))  C difficile quick scan w PCR reflex     Status: None   Collection Time: 09/29/16  1:40 PM  Result Value Ref Range Status   C Diff  antigen NEGATIVE NEGATIVE Final   C Diff toxin NEGATIVE NEGATIVE Final   C Diff interpretation No C. difficile detected.  Final    Radiology Reports Dg Chest 2 View  Result Date: 09/29/2016 CLINICAL DATA:  Cough for 2-3 days EXAM: CHEST  2 VIEW COMPARISON:  05/04/2016 FINDINGS: The There is no focal parenchymal opacity. There is no pleural effusion or pneumothorax. The heart and mediastinal contours are unremarkable. There is a T11 vertebral body compression fracture new compared with 05/15/2016. IMPRESSION: No active cardiopulmonary disease. Age indeterminate T11 vertebral body compression fracture new compared with 05/15/2016. Electronically Signed   By: Kathreen Devoid   On: 09/29/2016 13:35   Ct Thoracic Spine Wo Contrast  Result Date: 09/29/2016 CLINICAL DATA:  Abnormal chest x-ray with T11 compression fracture. Back pain. Multiple recent falls. History of L5 compression fracture status post augmentation. EXAM: CT THORACIC SPINE WITHOUT CONTRAST TECHNIQUE: Multidetector CT imaging of the thoracic spine was performed without intravenous contrast administration. Multiplanar CT image reconstructions were also generated. COMPARISON:  Chest radiographs 09/29/2016. Lumbar spine MRI 06/02/2016. Lumbar spine CT 05/15/2016. FINDINGS: Alignment: Mild lower thoracic dextroscoliosis. Trace anterolisthesis of C7 on T1. Vertebrae: As described on earlier chest radiographs, there is a T11 superior endplate compression fracture with approximately 65% height loss. This is new from the prior MRI and is felt to be recent as the fracture line is still visible. Retropulsion of the posterior T11 vertebral body cortex measures 5 mm and results in mild spinal stenosis. There is also a new T10 inferior endplate fracture with mild focal depression anteriorly but no significant generalized vertebral body height loss or retropulsion. The bones appear osteopenic. Paraspinal and other soft tissues: Scarring in the lung apices with  mild bronchiectasis in the right upper lobe. Mild centrilobular emphysema. Aortic atherosclerosis. Disc levels: Mild disc degeneration in the lower thoracic spine with degenerative endplate sclerosis. There is a small calcified right paracentral disc protrusion at T10-11 which results in mild right-sided spinal stenosis, present on the prior MRI though better visualized on the current examination. Thoracic facet arthrosis is most advanced at T12-L1 on the left. IMPRESSION: 1. Likely recent T11 vertebral fracture with 65% height loss and 5 mm retropulsion resulting in mild spinal stenosis. 2. T10 inferior endplate fracture without significant height loss. 3. Small right paracentral disc protrusion at T10-11 with mild spinal canal narrowing. Electronically Signed   By: Logan Bores M.D.   On: 09/29/2016 16:00   Mr Thoracic Spine Wo Contrast  Result Date: 09/29/2016 CLINICAL DATA:  Initial evaluation for acute back pain, history of multiple recent falls. EXAM: MRI  THORACIC AND LUMBAR SPINE WITHOUT CONTRAST TECHNIQUE: Multiplanar and multiecho pulse sequences of the thoracic and lumbar spine, were obtained without intravenous contrast. COMPARISON:  Comparison made with prior CT from earlier the same day. S the E FINDINGS: MRI THORACIC SPINE FINDINGS Alignment: Accentuation of the normal thoracic kyphosis. Mild dextroscoliosis of the lower thoracic spine. Vertebrae: Abnormal T1  hypointense, stir hyperintense signal intensity extends through the inferior endplate of T1, compatible with acute compression fracture. Minimal height loss of up to 25%. No significant bony retropulsion. There is an additional acute compression fracture involving the T11 vertebral body. Associated height loss of approximately 65%. Bony retropulsion measures 5 mm. Resultant mild spinal canal stenosis. Suspected associated fracture of the T11 spinous process which demonstrates marrow edema, with surrounding edema within the adjacent soft  tissues (series 10, image 40). Vertebral body heights otherwise maintained. No other acute fracture identified. Minimal STIR signal intensity within the T5 vertebral body favored to be related to reactive endplate changes. Signal intensity within the vertebral body bone marrow otherwise within normal limits. Benign hemangioma noted within the T12 and L1 vertebral body. No worrisome osseous lesions. Cord: Signal intensity within the thoracic spinal cord is normal. No evidence for traumatic cord injury. Paraspinal and other soft tissues: Paraspinous soft tissues demonstrate no acute abnormality. Small layering left pleural effusion noted. Disc levels: Limited views of the cervical spine on counter sequence demonstrate reversal of the normal cervical lordosis with multilevel degenerative spondylolysis. Scattered multilevel facet hypertrophy noted throughout much of the upper and mid thoracic spine. Minimal disc bulge at T2-3 and T8-9. No other significant disc bulging through the T9-10 level. No focal disc protrusions. No canal or foraminal stenosis from the T1-2 level through the T9-10 level. T10-11: Diffuse disc bulge with disc desiccation and intervertebral disc space narrowing. Superimposed right paracentral disc protrusion indents and flattens the ventral thecal sac with mild flattening of the right hemi cord (series 9, image 35). No cord signal changes. Superimposed posterior element hypertrophy. Mild canal and bilateral foraminal stenosis. T11-12: 5 mm bony retropulsion related to the T11 compression fracture. Retro falls ball lung indents the ventral thecal sac results in mild canal stenosis (series 9, image 38). Superimposed mild facet hypertrophy. No significant foraminal encroachment. T12-L1: Diffuse degenerative disc bulge with disc desiccation. Superimposed left paracentral disc protrusion mildly flattens and indents the ventral thecal sac without significant canal stenosis (series 9, image 44). No  significant foraminal narrowing. Mild edema about the left T11-12 facet at favored to be degenerative in nature. No definite fracture through the facet itself MRI LUMBAR SPINE FINDINGS Segmentation: Normal segmentation. Lowest well-formed disc is labeled the L5-S1 level. Alignment: 40 degrees of levoscoliosis with apex at L3 again seen, stable. Mild retrolisthesis of L1 on L2 and L2 on L3 also unchanged. Trace anterolisthesis of L5 on S1 also stable. Vertebrae: Chronic degenerative endplate Schmorl's node with associated height loss at the superior endplate of L3 again noted, stable. Since the prior study, there has been interval performance of vertebral augmentation at the L5 level. Vertebral body height at this level it is relatively stable. Known progressive fragmentation or bony retropulsion. Previously seen edema within the inferior endplate of L5 has largely resolved. Acute fractures involving the T10 and T11 vertebral bodies noted, described on the thoracic portion of this exam. Vertebral body heights otherwise maintained. No other acute fracture. Prominent benign hemangioma again noted at T12 and L1. No worrisome osseous lesions. Signal intensity within the vertebral body bone marrow otherwise within normal limits. Conus medullaris: Extends to the L1-2 level and appears normal. Paraspinal and other soft tissues: Paraspinous soft tissues demonstrate no acute abnormality. Fatty atrophy noted within the paraspinous musculature. Cholelithiasis noted. Visualized visceral structures otherwise grossly unremarkable. Disc levels: L1-2: Trace retrolisthesis. Diffuse degenerative disc bulge with facet hypertrophy. No significant canal stenosis. Mild bilateral foraminal stenosis. L2-3: Trace  retrolisthesis. Diffuse degenerative disc bulge with moderate bilateral facet arthrosis with ligamentum flavum hypertrophy. Moderate to severe canal stenosis and right lateral recess stenosis is stable. Fairly severe bilateral  foraminal narrowing also unchanged, worse on the right. L3-4: Diffuse degenerative disc bulge with advanced bilateral facet arthrosis, right greater than left. Moderate canal stenosis, stable. Severe right with more mild left foraminal narrowing also stable. L4-5: Diffuse degenerative disc bulge with advanced facet arthrosis. Moderate canal stenosis, stable. Left greater than right lateral recess narrowing. Mild right with more moderate left foraminal stenosis. L5-S1: Degenerative disc desiccation with mild disc bulge. Severe facet arthrosis. 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 No significant canal stenosis. Severe left foraminal stenosis with probable compression of the left L5 nerve root again noted. More mild to moderate right foraminal stenosis. No complication identified status post recent vertebral augmentation. IMPRESSION: MRI THORACIC IMPRESSION: 1. Acute T11 compression fracture with 65% height loss and 5 mm bony retropulsion. Resultant mild canal stenosis. 2. Acute compression fracture through the inferior endplate of [id number] with mild 20% height loss without bony retropulsion. 3. No evidence for traumatic cord injury within the thoracic spine. 4. Degenerative disc bulge with right paracentral disc protrusion at T10-11 with resultant mild canal stenosis. 5. Small layering left pleural effusion. MRI LUMBAR IMPRESSION: 1. Sequelae of interval vertebral augmentation at L5 without complication. 2. No other acute traumatic injury within the lumbar spine. Stable chronic L3 fracture. 3. 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 4. Moderate to severe spinal stenosis at L2-3, with more moderate canal narrowing at L3-4 and L4-5. 5. Severe left foraminal stenosis at L5-S1 with compression of the L5 nerve root. 6. Severe bilateral foraminal stenosis at L2-3, with severe right foraminal  stenosis at L3-4. Electronically Signed   By: Jeannine Boga M.D.   On: 09/29/2016 22:52   Mr Lumbar Spine Wo Contrast  Result Date: 09/29/2016 CLINICAL DATA:  Initial evaluation for acute back pain, history of multiple recent falls. EXAM: MRI  THORACIC AND LUMBAR SPINE WITHOUT CONTRAST TECHNIQUE: Multiplanar and multiecho pulse sequences of the thoracic and lumbar spine, were obtained without intravenous contrast. COMPARISON:  Comparison made with prior CT from earlier the same day. S the E FINDINGS: MRI THORACIC SPINE FINDINGS Alignment: Accentuation of the normal thoracic kyphosis. Mild dextroscoliosis of the lower thoracic spine. Vertebrae: Abnormal T1 hypointense, stir hyperintense signal intensity extends through the inferior endplate of T1, compatible with acute compression fracture. Minimal height loss of up to 25%. No significant bony retropulsion. There is an additional acute compression fracture involving the T11 vertebral body. Associated height loss of approximately 65%. Bony retropulsion measures 5 mm. Resultant mild spinal canal stenosis. Suspected associated fracture of the T11 spinous process which demonstrates marrow edema, with surrounding edema within the adjacent soft tissues (series 10, image 40). Vertebral body heights otherwise maintained. No other acute fracture identified. Minimal STIR signal intensity within the T5 vertebral body favored to be related to reactive endplate changes. Signal intensity within the vertebral body bone marrow otherwise within normal limits. Benign hemangioma noted within the T12 and L1 vertebral body. No worrisome osseous lesions. Cord: Signal intensity within the thoracic spinal cord is normal. No evidence for traumatic cord injury. Paraspinal and other soft tissues: Paraspinous soft tissues demonstrate no acute abnormality. Small layering left pleural effusion noted. Disc levels: Limited views of the cervical spine on counter sequence demonstrate  reversal of the normal  cervical lordosis with multilevel degenerative spondylolysis. Scattered multilevel facet hypertrophy noted throughout much of the upper and mid thoracic spine. Minimal disc bulge at T2-3 and T8-9. No other significant disc bulging through the T9-10 level. No focal disc protrusions. No canal or foraminal stenosis from the T1-2 level through the T9-10 level. T10-11: Diffuse disc bulge with disc desiccation and intervertebral disc space narrowing. Superimposed right paracentral disc protrusion indents and flattens the ventral thecal sac with mild flattening of the right hemi cord (series 9, image 35). No cord signal changes. Superimposed posterior element hypertrophy. Mild canal and bilateral foraminal stenosis. T11-12: 5 mm bony retropulsion related to the T11 compression fracture. Retro falls ball lung indents the ventral thecal sac results in mild canal stenosis (series 9, image 38). Superimposed mild facet hypertrophy. No significant foraminal encroachment. T12-L1: Diffuse degenerative disc bulge with disc desiccation. Superimposed left paracentral disc protrusion mildly flattens and indents the ventral thecal sac without significant canal stenosis (series 9, image 44). No significant foraminal narrowing. Mild edema about the left T11-12 facet at favored to be degenerative in nature. No definite fracture through the facet itself MRI LUMBAR SPINE FINDINGS Segmentation: Normal segmentation. Lowest well-formed disc is labeled the L5-S1 level. Alignment: 40 degrees of levoscoliosis with apex at L3 again seen, stable. Mild retrolisthesis of L1 on L2 and L2 on L3 also unchanged. Trace anterolisthesis of L5 on S1 also stable. Vertebrae: Chronic degenerative endplate Schmorl's node with associated height loss at the superior endplate of L3 again noted, stable. Since the prior study, there has been interval performance of vertebral augmentation at the L5 level. Vertebral body height at this level it  is relatively stable. Known progressive fragmentation or bony retropulsion. Previously seen edema within the inferior endplate of L5 has largely resolved. Acute fractures involving the T10 and T11 vertebral bodies noted, described on the thoracic portion of this exam. Vertebral body heights otherwise maintained. No other acute fracture. Prominent benign hemangioma again noted at T12 and L1. No worrisome osseous lesions. Signal intensity within the vertebral body bone marrow otherwise within normal limits. Conus medullaris: Extends to the L1-2 level and appears normal. Paraspinal and other soft tissues: Paraspinous soft tissues demonstrate no acute abnormality. Fatty atrophy noted within the paraspinous musculature. Cholelithiasis noted. Visualized visceral structures otherwise grossly unremarkable. Disc levels: L1-2: Trace retrolisthesis. Diffuse degenerative disc bulge with facet hypertrophy. No significant canal stenosis. Mild bilateral foraminal stenosis. L2-3: Trace retrolisthesis. Diffuse degenerative disc bulge with moderate bilateral facet arthrosis with ligamentum flavum hypertrophy. Moderate to severe canal stenosis and right lateral recess stenosis is stable. Fairly severe bilateral foraminal narrowing also unchanged, worse on the right. L3-4: Diffuse degenerative disc bulge with advanced bilateral facet arthrosis, right greater than left. Moderate canal stenosis, stable. Severe right with more mild left foraminal narrowing also stable. L4-5: Diffuse degenerative disc bulge with advanced facet arthrosis. Moderate canal stenosis, stable. Left greater than right lateral recess narrowing. Mild right with more moderate left foraminal stenosis. L5-S1: Degenerative disc desiccation with mild disc bulge. Severe facet arthrosis. 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 No significant canal stenosis. Severe left foraminal stenosis with probable compression of the left L5 nerve root again  noted. More mild to moderate right foraminal stenosis. No complication identified status post recent vertebral augmentation. IMPRESSION: MRI THORACIC IMPRESSION: 1. Acute T11 compression fracture with 65% height loss and 5 mm bony retropulsion. Resultant mild canal stenosis. 2. Acute compression fracture through the inferior endplate of [id number] with mild 20%  height loss without bony retropulsion. 3. No evidence for traumatic cord injury within the thoracic spine. 4. Degenerative disc bulge with right paracentral disc protrusion at T10-11 with resultant mild canal stenosis. 5. Small layering left pleural effusion. MRI LUMBAR IMPRESSION: 1. Sequelae of interval vertebral augmentation at L5 without complication. 2. No other acute traumatic injury within the lumbar spine. Stable chronic L3 fracture. 3. 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 4. Moderate to severe spinal stenosis at L2-3, with more moderate canal narrowing at L3-4 and L4-5. 5. Severe left foraminal stenosis at L5-S1 with compression of the L5 nerve root. 6. Severe bilateral foraminal stenosis at L2-3, with severe right foraminal stenosis at L3-4. Electronically Signed   By: Jeannine Boga M.D.   On: 09/29/2016 22:52   Dg Op Swallowing Func-medicare/speech Path  Result Date: 09/27/2016 Objective Swallowing Evaluation: Type of Study: MBS-Modified Barium Swallow Study Patient Details Name: Tara Neal MRN: [id number] Date of Birth: 04-05-1941 Today's Date: 09/27/2016 Time: No Data Recorded-SLP Stop Time (ACUTE ONLY): 1310 (1340 time out) No Data Recorded Past Medical History: Past Medical History: Diagnosis Date . Allergy  . Anemia   hx of  . Anxiety  . Chronic insomnia 04/22/2012 . COPD (chronic obstructive pulmonary disease) ([place])  . Diverticulosis of colon  . GERD (gastroesophageal reflux disease)  . Glaucoma  . Hormone replacement therapy  (postmenopausal)  . Hyperlipidemia  . Hypertension  . Low back pain  . Migraine headache   denies . Mild obesity  . Osteoarthritis  . Osteopenia  . Osteoporosis  . Pneumonia   hx of 5 years ago  . Scoliosis  . Shortness of breath   with exertion  . Spontaneous pneumothorax   1968 . Umbilical hernia [id number] Past Surgical History: Past Surgical History: Procedure Laterality Date . EXCISION/RELEASE BURSA HIP  04/26/2012  Procedure: EXCISION/RELEASE BURSA HIP;  Surgeon: Gearlean Alf, MD;  Location: [facility];  Service: Orthopedics;  Laterality: Right; . EYE SURGERY    - bil cataract surg and Retina surgery to left eye . HERNIA REPAIR  [id number]  umbilical hernia . HIP ADDUCTOR TENOTOMY   . IR GENERIC HISTORICAL  06/17/2016  IR RADIOLOGIST EVAL & MGMT 06/17/2016 [facility] . IR GENERIC HISTORICAL  07/01/2016  IR VERTEBROPLASTY LUMBAR BX INC UNI/BIL INC/INJECT/IMAGING 07/01/2016 Luanne Bras, MD [facility] . IR GENERIC HISTORICAL  07/16/2016  IR RADIOLOGIST EVAL & MGMT 07/16/2016 [facility] . LUMBAR DISC SURGERY     x 2 . OTHER SURGICAL HISTORY    benign growth removed from right under ear at age 11  . SHOULDER ARTHROSCOPY    left shoulder . TONSILLECTOMY   . TONSILLECTOMY   . WRIST FRACTURE SURGERY    plate in left wrist/ fractured 2 times HPI: pt is a 75 yo female referred by Dr Tat for MBS.  PMH + for DM, migraines, COPD, colon AVM, anemia, oral thrush, C3-C4 cervical osteophytes, anxiety, COPD, HLD.  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 Pt reported to MD problems swallowing food and drink- more so with liquids over the last few years but worsening within the last year.     Subjective: pt awake in chair, family present Assessment / Plan / Recommendation CHL IP CLINICAL IMPRESSIONS 09/27/2016 Therapy Diagnosis Mild oral phase dysphagia;Mild pharyngeal phase dysphagia;Mild cervical esophageal phase dysphagia Clinical Impression Mild oropharyngeal  and cervical esophageal dysphagia  present. Decreased oral pressure results in premature spillage, mild oral residuals and piece-mealing.  Pharyngeal swallow largely strong with only minimal residuals. Pt did not aspirate or deeply penetrate with any bolus - minute penetration of thin/nectar to upper larynx cleared independently.  Pt did not demonstrate symptoms *coughing with intake* during MBS unfortunately.  She does appear with prominent cricopharyngeus and diverticulum trapping some barium but no backflow noted.  SLP questions if this could be source of pt choking.  SLP testing chin tuck posture with pt to allow use if indicated in future - no deficits in swallow noted with posture therefore advised use as needed.  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  Advised pt to maintain strong cough/voice/expectoration abilities as much as able due to possible progressive neurological disease.  Thanks for this consult.     Impact on safety and function Mild aspiration risk   [facility] IP TREATMENT RECOMMENDATION 05/17/2016 Treatment Recommendations No treatment recommended at this time   No flowsheet data found. [facility] IP DIET RECOMMENDATION 09/27/2016 SLP Diet Recommendations Regular solids;Thin liquid Liquid Administration via Cup;Straw Medication Administration Whole meds with puree Compensations Slow rate;Small sips/bites;Other (Comment) Postural Changes Remain semi-upright after after feeds/meals (Comment);Seated upright at 90 degrees   [facility] IP OTHER RECOMMENDATIONS 09/27/2016 Recommended Consults -- Oral Care Recommendations Oral care BID Other Recommendations --   [facility] IP FOLLOW UP RECOMMENDATIONS 05/17/2016 Follow up Recommendations None   No flowsheet data found.     [facility] IP ORAL PHASE 09/27/2016 Oral Phase Impaired Oral - Pudding Teaspoon -- Oral - Pudding Cup -- Oral - Honey Teaspoon -- Oral - Honey Cup -- Oral - Nectar Teaspoon  -- Oral - Nectar Cup Weak lingual manipulation;Reduced posterior propulsion Oral - Nectar Straw -- Oral - Thin Teaspoon -- Oral - Thin Cup Weak lingual manipulation;Reduced posterior propulsion;Lingual/palatal residue Oral - Thin Straw Weak lingual manipulation;Reduced posterior propulsion Oral - Puree Weak lingual manipulation;Reduced posterior propulsion;Lingual/palatal residue Oral - Mech Soft NT Oral - Regular Weak lingual manipulation;Reduced posterior propulsion;Lingual/palatal residue Oral - Multi-Consistency -- Oral - Pill WFL Oral Phase - Comment piecemealing, pt does not sense oral residuals but can clear with dry swallows that are cued  [facility] IP PHARYNGEAL PHASE 09/27/2016 Pharyngeal Phase Impaired Pharyngeal- Pudding Teaspoon -- Pharyngeal -- Pharyngeal- Pudding Cup -- Pharyngeal -- Pharyngeal- Honey Teaspoon -- Pharyngeal -- Pharyngeal- Honey Cup -- Pharyngeal -- Pharyngeal- Nectar Teaspoon -- Pharyngeal Material enters airway, remains ABOVE vocal cords then ejected out Pharyngeal- Nectar Cup [facility];Penetration/Aspiration during swallow Pharyngeal Material enters airway, remains ABOVE vocal cords then ejected out Pharyngeal- Nectar Straw -- Pharyngeal -- Pharyngeal- Thin Teaspoon -- Pharyngeal -- Pharyngeal- Thin Cup Penetration/Aspiration during swallow Pharyngeal Material enters airway, remains ABOVE vocal cords then ejected out Pharyngeal- Thin Straw Penetration/Aspiration during swallow Pharyngeal Material enters airway, remains ABOVE vocal cords then ejected out Pharyngeal- Puree WFL Pharyngeal -- Pharyngeal- Mechanical Soft -- Pharyngeal -- Pharyngeal- Regular WFL Pharyngeal -- Pharyngeal- Multi-consistency -- Pharyngeal -- Pharyngeal- Pill WFL Pharyngeal -- Pharyngeal Comment (No Data)  [facility] IP CERVICAL ESOPHAGEAL PHASE 09/27/2016 Cervical Esophageal Phase Impaired Pudding Teaspoon -- Pudding Cup -- Honey Teaspoon -- Honey Cup -- Nectar Teaspoon -- Nectar Cup Reduced cricopharyngeal  relaxation;Prominent cricopharyngeal segment Nectar Straw -- Thin Teaspoon -- Thin Cup Prominent cricopharyngeal segment Thin Straw Reduced cricopharyngeal relaxation;Prominent cricopharyngeal segment Puree Reduced cricopharyngeal relaxation;Prominent cricopharyngeal segment Mechanical Soft -- Regular Reduced cricopharyngeal relaxation;Prominent cricopharyngeal segment Multi-consistency -- Pill Reduced  cricopharyngeal relaxation;Prominent cricopharyngeal segment Cervical Esophageal Comment barium tablet appeared to pause at proximal esophagus without pt awareness, clearing with further swallows, pt appears with 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 Initial encounter. EXAM: MODIFIED BARIUM SWALLOW TECHNIQUE: Different consistencies of barium were administered orally to the patient by the Speech Pathologist. Imaging of the pharynx was performed in the lateral projection. FLUOROSCOPY TIME:  Fluoroscopy Time:  1 minutes and 6 seconds Radiation Exposure Index (if provided by the fluoroscopic device): 2.2 mGy Number of Acquired Spot Images: 0 COMPARISON:  None. FINDINGS: Thin liquid- trace laryngeal penetration. Question tiny Zenker's diverticulum. Nectar thick liquid- trace laryngeal penetration. Pure- trace laryngeal penetration. Minimal pooling in the valleculae which clears with subsequent swallowing. Cracker-within normal limits Barium tablet - slightly delayed clearance from the cervical esophagus. IMPRESSION: Trace laryngeal penetration without aspiration. Question tiny Zenker's diverticulum. Slightly delayed clearance of ingested barium tablet. Please refer to the Speech Pathologists  report for complete details and recommendations. Electronically Signed   By: Genia Del M.D.   On: 09/27/2016 14:16     CBC  Recent Labs Lab 09/29/16 1128  WBC 8.5  HGB 12.4  HCT 38.0  PLT 233  MCV 99.5  MCH 32.5  MCHC 32.6  RDW 14.2    Chemistries   Recent Labs Lab 09/29/16 1128  NA 136  K 4.3  CL 103  CO2 28  GLUCOSE 101*  BUN 14  CREATININE 0.70  CALCIUM 8.9  AST 26  ALT 9*  ALKPHOS 68  BILITOT 0.7   ------------------------------------------------------------------------------------------------------------------ estimated creatinine clearance is 53.3 mL/min (by C-G formula based on SCr of 0.7 mg/dL). ------------------------------------------------------------------------------------------------------------------ No results for input(s): HGBA1C in the last 72 hours. ------------------------------------------------------------------------------------------------------------------ No results for input(s): CHOL, HDL, LDLCALC, TRIG, CHOLHDL, LDLDIRECT in the last 72 hours. ------------------------------------------------------------------------------------------------------------------ No results for input(s): TSH, T4TOTAL, T3FREE, THYROIDAB in the last 72 hours.  Invalid input(s): FREET3 ------------------------------------------------------------------------------------------------------------------ No results for input(s): VITAMINB12, FOLATE, FERRITIN, TIBC, IRON, RETICCTPCT in the last 72 hours.  Coagulation profile No results for input(s): INR, PROTIME in the last 168 hours.  No results for input(s): DDIMER in the last 72 hours.  Cardiac Enzymes No results for input(s): CKMB, TROPONINI, MYOGLOBIN in the last 168 hours.  Invalid input(s): CK ------------------------------------------------------------------------------------------------------------------ Invalid input(s): POCBNP   CBG: No results for input(s): GLUCAP in the last 168  hours.     Studies: Dg Chest 2 View  Result Date: 09/29/2016 CLINICAL DATA:  Cough for 2-3 days EXAM: CHEST  2 VIEW COMPARISON:  05/04/2016 FINDINGS: The There is no focal parenchymal opacity. There is no pleural effusion or pneumothorax. The heart and mediastinal contours are unremarkable. There is a T11 vertebral body compression fracture new compared with 05/15/2016. IMPRESSION: No active cardiopulmonary disease. Age indeterminate T11 vertebral body compression fracture new compared with 05/15/2016. Electronically Signed   By: Kathreen Devoid   On: 09/29/2016 13:35   Ct Thoracic Spine Wo Contrast  Result Date: 09/29/2016 CLINICAL DATA:  Abnormal chest x-ray with T11 compression fracture. Back pain. Multiple recent falls. History of L5 compression fracture status post augmentation. EXAM: CT THORACIC SPINE WITHOUT CONTRAST TECHNIQUE: Multidetector CT imaging of the thoracic spine was performed without intravenous contrast administration. Multiplanar CT image  reconstructions were also generated. COMPARISON:  Chest radiographs 09/29/2016. Lumbar spine MRI 06/02/2016. Lumbar spine CT 05/15/2016. FINDINGS: Alignment: Mild lower thoracic dextroscoliosis. Trace anterolisthesis of C7 on T1. Vertebrae: As described on earlier chest radiographs, there is a T11 superior endplate compression fracture with approximately 65% height loss. This is new from the prior MRI and is felt to be recent as the fracture line is still visible. Retropulsion of the posterior T11 vertebral body cortex measures 5 mm and results in mild spinal stenosis. There is also a new T10 inferior endplate fracture with mild focal depression anteriorly but no significant generalized vertebral body height loss or retropulsion. The bones appear osteopenic. Paraspinal and other soft tissues: Scarring in the lung apices with mild bronchiectasis in the right upper lobe. Mild centrilobular emphysema. Aortic atherosclerosis. Disc levels: Mild disc  degeneration in the lower thoracic spine with degenerative endplate sclerosis. There is a small calcified right paracentral disc protrusion at T10-11 which results in mild right-sided spinal stenosis, present on the prior MRI though better visualized on the current examination. Thoracic facet arthrosis is most advanced at T12-L1 on the left. IMPRESSION: 1. Likely recent T11 vertebral fracture with 65% height loss and 5 mm retropulsion resulting in mild spinal stenosis. 2. T10 inferior endplate fracture without significant height loss. 3. Small right paracentral disc protrusion at T10-11 with mild spinal canal narrowing. Electronically Signed   By: Logan Bores M.D.   On: 09/29/2016 16:00   Mr Thoracic Spine Wo Contrast  Result Date: 09/29/2016 CLINICAL DATA:  Initial evaluation for acute back pain, history of multiple recent falls. EXAM: MRI  THORACIC AND LUMBAR SPINE WITHOUT CONTRAST TECHNIQUE: Multiplanar and multiecho pulse sequences of the thoracic and lumbar spine, were obtained without intravenous contrast. COMPARISON:  Comparison made with prior CT from earlier the same day. S the E FINDINGS: MRI THORACIC SPINE FINDINGS Alignment: Accentuation of the normal thoracic kyphosis. Mild dextroscoliosis of the lower thoracic spine. Vertebrae: Abnormal T1 hypointense, stir hyperintense signal intensity extends through the inferior endplate of T1, compatible with acute compression fracture. Minimal height loss of up to 25%. No significant bony retropulsion. There is an additional acute compression fracture involving the T11 vertebral body. Associated height loss of approximately 65%. Bony retropulsion measures 5 mm. Resultant mild spinal canal stenosis. Suspected associated fracture of the T11 spinous process which demonstrates marrow edema, with surrounding edema within the adjacent soft tissues (series 10, image 40). Vertebral body heights otherwise maintained. No other acute fracture identified. Minimal STIR  signal intensity within the T5 vertebral body favored to be related to reactive endplate changes. Signal intensity within the vertebral body bone marrow otherwise within normal limits. Benign hemangioma noted within the T12 and L1 vertebral body. No worrisome osseous lesions. Cord: Signal intensity within the thoracic spinal cord is normal. No evidence for traumatic cord injury. Paraspinal and other soft tissues: Paraspinous soft tissues demonstrate no acute abnormality. Small layering left pleural effusion noted. Disc levels: Limited views of the cervical spine on counter sequence demonstrate reversal of the normal cervical lordosis with multilevel degenerative spondylolysis. Scattered multilevel facet hypertrophy noted throughout much of the upper and mid thoracic spine. Minimal disc bulge at T2-3 and T8-9. No other significant disc bulging through the T9-10 level. No focal disc protrusions. No canal or foraminal stenosis from the T1-2 level through the T9-10 level. T10-11: Diffuse disc bulge with disc desiccation and intervertebral disc space narrowing. Superimposed right paracentral disc protrusion indents and flattens the ventral thecal sac with mild  flattening of the right hemi cord (series 9, image 35). No cord signal changes. Superimposed posterior element hypertrophy. Mild canal and bilateral foraminal stenosis. T11-12: 5 mm bony retropulsion related to the T11 compression fracture. Retro falls ball lung indents the ventral thecal sac results in mild canal stenosis (series 9, image 38). Superimposed mild facet hypertrophy. No significant foraminal encroachment. T12-L1: Diffuse degenerative disc bulge with disc desiccation. Superimposed left paracentral disc protrusion mildly flattens and indents the ventral thecal sac without significant canal stenosis (series 9, image 44). No significant foraminal narrowing. Mild edema about the left T11-12 facet at favored to be degenerative in nature. No definite  fracture through the facet itself MRI LUMBAR SPINE FINDINGS Segmentation: Normal segmentation. Lowest well-formed disc is labeled the L5-S1 level. Alignment: 40 degrees of levoscoliosis with apex at L3 again seen, stable. Mild retrolisthesis of L1 on L2 and L2 on L3 also unchanged. Trace anterolisthesis of L5 on S1 also stable. Vertebrae: Chronic degenerative endplate Schmorl's node with associated height loss at the superior endplate of L3 again noted, stable. Since the prior study, there has been interval performance of vertebral augmentation at the L5 level. Vertebral body height at this level it is relatively stable. Known progressive fragmentation or bony retropulsion. Previously seen edema within the inferior endplate of L5 has largely resolved. Acute fractures involving the T10 and T11 vertebral bodies noted, described on the thoracic portion of this exam. Vertebral body heights otherwise maintained. No other acute fracture. Prominent benign hemangioma again noted at T12 and L1. No worrisome osseous lesions. Signal intensity within the vertebral body bone marrow otherwise within normal limits. Conus medullaris: Extends to the L1-2 level and appears normal. Paraspinal and other soft tissues: Paraspinous soft tissues demonstrate no acute abnormality. Fatty atrophy noted within the paraspinous musculature. Cholelithiasis noted. Visualized visceral structures otherwise grossly unremarkable. Disc levels: L1-2: Trace retrolisthesis. Diffuse degenerative disc bulge with facet hypertrophy. No significant canal stenosis. Mild bilateral foraminal stenosis. L2-3: Trace retrolisthesis. Diffuse degenerative disc bulge with moderate bilateral facet arthrosis with ligamentum flavum hypertrophy. Moderate to severe canal stenosis and right lateral recess stenosis is stable. Fairly severe bilateral foraminal narrowing also unchanged, worse on the right. L3-4: Diffuse degenerative disc bulge with advanced bilateral facet  arthrosis, right greater than left. Moderate canal stenosis, stable. Severe right with more mild left foraminal narrowing also stable. L4-5: Diffuse degenerative disc bulge with advanced facet arthrosis. Moderate canal stenosis, stable. Left greater than right lateral recess narrowing. Mild right with more moderate left foraminal stenosis. L5-S1: Degenerative disc desiccation with mild disc bulge. Severe facet arthrosis. 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 No significant canal stenosis. Severe left foraminal stenosis with probable compression of the left L5 nerve root again noted. More mild to moderate right foraminal stenosis. No complication identified status post recent vertebral augmentation. IMPRESSION: MRI THORACIC IMPRESSION: 1. Acute T11 compression fracture with 65% height loss and 5 mm bony retropulsion. Resultant mild canal stenosis. 2. Acute compression fracture through the inferior endplate of [id number] with mild 20% height loss without bony retropulsion. 3. No evidence for traumatic cord injury within the thoracic spine. 4. Degenerative disc bulge with right paracentral disc protrusion at T10-11 with resultant mild canal stenosis. 5. Small layering left pleural effusion. MRI LUMBAR IMPRESSION: 1. Sequelae of interval vertebral augmentation at L5 without complication. 2. No other acute traumatic injury within the lumbar spine. Stable chronic L3 fracture. 3. 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 4. Moderate to severe spinal stenosis at L2-3, with more moderate canal narrowing at L3-4 and L4-5. 5. Severe left foraminal stenosis at L5-S1 with compression of the L5 nerve root. 6. Severe bilateral foraminal stenosis at L2-3, with severe right foraminal stenosis at L3-4. Electronically Signed   By: Jeannine Boga M.D.   On: 09/29/2016 22:52   Mr Lumbar Spine Wo  Contrast  Result Date: 09/29/2016 CLINICAL DATA:  Initial evaluation for acute back pain, history of multiple recent falls. EXAM: MRI  THORACIC AND LUMBAR SPINE WITHOUT CONTRAST TECHNIQUE: Multiplanar and multiecho pulse sequences of the thoracic and lumbar spine, were obtained without intravenous contrast. COMPARISON:  Comparison made with prior CT from earlier the same day. S the E FINDINGS: MRI THORACIC SPINE FINDINGS Alignment: Accentuation of the normal thoracic kyphosis. Mild dextroscoliosis of the lower thoracic spine. Vertebrae: Abnormal T1 hypointense, stir hyperintense signal intensity extends through the inferior endplate of T1, compatible with acute compression fracture. Minimal height loss of up to 25%. No significant bony retropulsion. There is an additional acute compression fracture involving the T11 vertebral body. Associated height loss of approximately 65%. Bony retropulsion measures 5 mm. Resultant mild spinal canal stenosis. Suspected associated fracture of the T11 spinous process which demonstrates marrow edema, with surrounding edema within the adjacent soft tissues (series 10, image 40). Vertebral body heights otherwise maintained. No other acute fracture identified. Minimal STIR signal intensity within the T5 vertebral body favored to be related to reactive endplate changes. Signal intensity within the vertebral body bone marrow otherwise within normal limits. Benign hemangioma noted within the T12 and L1 vertebral body. No worrisome osseous lesions. Cord: Signal intensity within the thoracic spinal cord is normal. No evidence for traumatic cord injury. Paraspinal and other soft tissues: Paraspinous soft tissues demonstrate no acute abnormality. Small layering left pleural effusion noted. Disc levels: Limited views of the cervical spine on counter sequence demonstrate reversal of the normal cervical lordosis with multilevel degenerative spondylolysis. Scattered multilevel facet hypertrophy  noted throughout much of the upper and mid thoracic spine. Minimal disc bulge at T2-3 and T8-9. No other significant disc bulging through the T9-10 level. No focal disc protrusions. No canal or foraminal stenosis from the T1-2 level through the T9-10 level. T10-11: Diffuse disc bulge with disc desiccation and intervertebral disc space narrowing. Superimposed right paracentral disc protrusion indents and flattens the ventral thecal sac with mild flattening of the right hemi cord (series 9, image 35). No cord signal changes. Superimposed posterior element hypertrophy. Mild canal and bilateral foraminal stenosis. T11-12: 5 mm bony retropulsion related to the T11 compression fracture. Retro falls ball lung indents the ventral thecal sac results in mild canal stenosis (series 9, image 38). Superimposed mild facet hypertrophy. No significant foraminal encroachment. T12-L1: Diffuse degenerative disc bulge with disc desiccation. Superimposed left paracentral disc protrusion mildly flattens and indents the ventral thecal sac without significant canal stenosis (series 9, image 44). No significant foraminal narrowing. Mild edema about the left T11-12 facet at favored to be degenerative in nature. No definite fracture through the facet itself MRI LUMBAR SPINE FINDINGS Segmentation: Normal segmentation. Lowest well-formed disc is labeled the L5-S1 level. Alignment: 40 degrees of levoscoliosis with apex at L3 again seen, stable. Mild retrolisthesis of L1 on L2 and L2 on L3 also unchanged. Trace anterolisthesis of L5 on S1 also stable. Vertebrae: Chronic degenerative endplate Schmorl's node with associated height loss at the superior endplate of L3 again noted, stable. Since the prior  study, there has been interval performance of vertebral augmentation at the L5 level. Vertebral body height at this level it is relatively stable. Known progressive fragmentation or bony retropulsion. Previously seen edema within the inferior  endplate of L5 has largely resolved. Acute fractures involving the T10 and T11 vertebral bodies noted, described on the thoracic portion of this exam. Vertebral body heights otherwise maintained. No other acute fracture. Prominent benign hemangioma again noted at T12 and L1. No worrisome osseous lesions. Signal intensity within the vertebral body bone marrow otherwise within normal limits. Conus medullaris: Extends to the L1-2 level and appears normal. Paraspinal and other soft tissues: Paraspinous soft tissues demonstrate no acute abnormality. Fatty atrophy noted within the paraspinous musculature. Cholelithiasis noted. Visualized visceral structures otherwise grossly unremarkable. Disc levels: L1-2: Trace retrolisthesis. Diffuse degenerative disc bulge with facet hypertrophy. No significant canal stenosis. Mild bilateral foraminal stenosis. L2-3: Trace retrolisthesis. Diffuse degenerative disc bulge with moderate bilateral facet arthrosis with ligamentum flavum hypertrophy. Moderate to severe canal stenosis and right lateral recess stenosis is stable. Fairly severe bilateral foraminal narrowing also unchanged, worse on the right. L3-4: Diffuse degenerative disc bulge with advanced bilateral facet arthrosis, right greater than left. Moderate canal stenosis, stable. Severe right with more mild left foraminal narrowing also stable. L4-5: Diffuse degenerative disc bulge with advanced facet arthrosis. Moderate canal stenosis, stable. Left greater than right lateral recess narrowing. Mild right with more moderate left foraminal stenosis. L5-S1: Degenerative disc desiccation with mild disc bulge. Severe facet arthrosis. 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 No significant canal stenosis. Severe left foraminal stenosis with probable compression of the left L5 nerve root again noted. More mild to moderate right foraminal stenosis. No complication identified status post recent vertebral  augmentation. IMPRESSION: MRI THORACIC IMPRESSION: 1. Acute T11 compression fracture with 65% height loss and 5 mm bony retropulsion. Resultant mild canal stenosis. 2. Acute compression fracture through the inferior endplate of [id number] with mild 20% height loss without bony retropulsion. 3. No evidence for traumatic cord injury within the thoracic spine. 4. Degenerative disc bulge with right paracentral disc protrusion at T10-11 with resultant mild canal stenosis. 5. Small layering left pleural effusion. MRI LUMBAR IMPRESSION: 1. Sequelae of interval vertebral augmentation at L5 without complication. 2. No other acute traumatic injury within the lumbar spine. Stable chronic L3 fracture. 3. 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 4. Moderate to severe spinal stenosis at L2-3, with more moderate canal narrowing at L3-4 and L4-5. 5. Severe left foraminal stenosis at L5-S1 with compression of the L5 nerve root. 6. Severe bilateral foraminal stenosis at L2-3, with severe right foraminal stenosis at L3-4. Electronically Signed   By: Jeannine Boga M.D.   On: 09/29/2016 22:52      Lab Results  Component Value Date   HGBA1C 5.7 03/31/2016   HGBA1C 5.8 04/11/2015   HGBA1C 5.8 06/06/2014   Lab Results  Component Value Date   MICROALBUR 1.3 03/31/2016   LDLCALC 47 06/06/2014   CREATININE 0.70 09/29/2016       Scheduled Meds: . atenolol  50 mg Oral Daily  . benazepril  40 mg Oral Daily  . calcium carbonate  1,250 mg Oral BID WC  . carbidopa-levodopa  1 tablet Oral TID  . cholecalciferol  1,000 Units Oral Daily  . enoxaparin (LOVENOX) injection  40 mg Subcutaneous Q24H  . folic acid  1 mg Oral Daily  . gabapentin  300 mg Oral TID  . latanoprost  1 drop Both Eyes QHS  . multivitamin with minerals  1 tablet Oral Daily  . pantoprazole  40 mg Oral Daily  . simvastatin  40 mg Oral q1800  .  timolol  1 drop Both Eyes BID  . venlafaxine XR  150 mg Oral Q breakfast  . vitamin C  1,000 mg Oral Daily   Continuous Infusions: . dextrose 5 % and 0.45% NaCl 75 mL/hr at 09/30/16 0600     LOS: 1 day    Time spent: >30 MINS    [facility]  [facility] Pager [phone]. If 7PM-7AM, please contact night-coverage at [email or web address], password [facility] 09/30/2016, 10:48 AM  LOS: 1 day

## 2016-09-30 NOTE — Care Management Obs Status (Signed)
[place] NOTIFICATION   Patient Details  Name: Tara Neal MRN: [id number] Date of Birth: [birth year]   Medicare Observation Status Notification Given:  Yes    Dellie Catholic, RN 09/30/2016, 11:04 AM

## 2016-09-30 NOTE — Evaluation (Signed)
Physical Therapy Evaluation Patient Details Name: Tara Neal MRN: [id number] DOB: [birth year] Today's Date: 09/30/2016   History of Present Illness  75 y.o. female admitted with diarrhea, frequent falls. Dx of acute T10, T11 compression fractures with canal stenosis. PMH of HTN, OA, back surgeries.   Clinical Impression  Pt admitted with above diagnosis. Pt currently with functional limitations due to the deficits listed below (see PT Problem List). Pt ambulated 20' with RW and min A for balance 2* scissoring gait pattern (pt/family report this is baseline).  HHPT recommended.  Pt will benefit from skilled PT to increase their independence and safety with mobility to allow discharge to the venue listed below.       Follow Up Recommendations Home health PT;Other (comment) (home health aide)    Equipment Recommendations  None recommended by PT    Recommendations for Other Services       Precautions / Restrictions Precautions Precautions: Fall Precaution Comments: h/o falls Restrictions Weight Bearing Restrictions: No      Mobility  Bed Mobility Overal bed mobility: Modified Independent                Transfers Overall transfer level: Needs assistance Equipment used: Rolling walker (2 wheeled) Transfers: Sit to/from Stand Sit to Stand: Mod assist         General transfer comment: VCs hand placement, Assist to rise, posterior lean initially in standing  Ambulation/Gait Ambulation/Gait assistance: Min assist Ambulation Distance (Feet): 20 Feet Assistive device: Rolling walker (2 wheeled) Gait Pattern/deviations: Step-to pattern;Decreased step length - right;Decreased step length - left;Narrow base of support   Gait velocity interpretation: Below normal speed for age/gender General Gait Details: RLE scissors, pt stated this is baseline, able to partially correct with VCs, min A for balance, distance limited by onset of diarrhea  Stairs            Wheelchair  Mobility    Modified Rankin (Stroke Patients Only)       Balance Overall balance assessment: Needs assistance   Sitting balance-Leahy Scale: Good     Standing balance support: Bilateral upper extremity supported Standing balance-Leahy Scale: Poor Standing balance comment: posterior lean initially in standing, then able to maintain neutral with BUE support                             Pertinent Vitals/Pain Pain Assessment: 0-10 Pain Score: 4  Pain Location: mid back Pain Descriptors / Indicators: Aching Pain Intervention(s): Limited activity within patient's tolerance;Monitored during session;Repositioned    Home Living Family/patient expects to be discharged to:: Private residence Living Arrangements: Spouse/significant other Available Help at Discharge: Family;Available 24 hours/day Type of Home: House Home Access: Stairs to enter   [employer] of Steps: 1 Home Layout: One level Home Equipment: Walker - 2 wheels;Wheelchair - power;Walker - 4 wheels Additional Comments: pt husband has had multiple back surgeries     Prior Function Level of Independence: Needs assistance   Gait / Transfers Assistance Needed: husband assists with ambulation with RW  ADL's / Homemaking Assistance Needed: husband provides assistance wtih bathing and dressing        Hand Dominance        Extremity/Trunk Assessment   Upper Extremity Assessment: Overall WFL for tasks assessed           Lower Extremity Assessment: Overall WFL for tasks assessed      Cervical / Trunk Assessment: Kyphotic  Communication  Communication: No difficulties  Cognition Arousal/Alertness: Awake/alert Behavior During Therapy: WFL for tasks assessed/performed Overall Cognitive Status: Within Functional Limits for tasks assessed                      General Comments      Exercises     Assessment/Plan    PT Assessment Patient needs continued PT services  PT  Problem List Decreased activity tolerance;Decreased balance;Pain;Decreased mobility          PT Treatment Interventions DME instruction;Gait training;Functional mobility training;Therapeutic exercise;Therapeutic activities;Balance training;Patient/family education    PT Goals (Current goals can be found in the Care Plan section)  Acute Rehab PT Goals Patient Stated Goal: return home PT Goal Formulation: With patient/family Time For Goal Achievement: 10/14/16 Potential to Achieve Goals: Fair    Frequency Min 3X/week   Barriers to discharge        Co-evaluation               End of Session Equipment Utilized During Treatment: Gait belt Activity Tolerance: Patient tolerated treatment well Patient left: in chair;with call bell/phone within reach;with family/visitor present;with chair alarm set Nurse Communication: Mobility status    Functional Assessment Tool Used: clinical judgement Functional Limitation: Mobility: Walking and moving around Mobility: Walking and Moving Around Current Status [phone]): At least 40 percent but less than 60 percent impaired, limited or restricted Mobility: Walking and Moving Around Goal Status [phone]): At least 20 percent but less than 40 percent impaired, limited or restricted    Time: 1333-1404 PT Time Calculation (min) (ACUTE ONLY): 31 min   Charges:   PT Evaluation $PT Eval Low Complexity: 1 Procedure PT Treatments $Gait Training: 8-22 mins   PT G Codes:   PT G-Codes **NOT FOR INPATIENT CLASS** Functional Assessment Tool Used: clinical judgement Functional Limitation: Mobility: Walking and moving around Mobility: Walking and Moving Around Current Status [id number] At least 40 percent but less than 60 percent impaired, limited or restricted Mobility: Walking and Moving Around Goal Status [phone]): At least 20 percent but less than 40 percent impaired, limited or restricted    Philomena Doheny 09/30/2016, 2:12  PM [phone]

## 2016-09-30 NOTE — Progress Notes (Signed)
Pharmacy Antibiotic Note  Tara Neal is a 75 y.o. female admitted on 09/29/2016 with ecoli diarrhea.  Pharmacy has been consulted for Cipro dosing.  Plan: The dose of Cipro will be adjusted to 500mg  PO BID based on renal function.  Will sign off  Height: 5\' 4"  (162.6 cm) Weight: 130 lb (59 kg) IBW/kg (Calculated) : 54.7  Temp (24hrs), Avg:98.1 F (36.7 C), Min:98 F (36.7 C), Max:98.1 F (36.7 C)   Recent Labs Lab 09/29/16 1128 09/29/16 1337 09/29/16 1544  WBC 8.5  --   --   CREATININE 0.70  --   --   LATICACIDVEN  --  0.83 0.92    Estimated Creatinine Clearance: 53.3 mL/min (by C-G formula based on SCr of 0.7 mg/dL).    Allergies  Allergen Reactions  . Codeine Nausea Only    Can take low doses  . Sulfonamide Derivatives Nausea Only    REACTION: Nausea  . Penicillins Rash    .Marland KitchenHas patient had a PCN reaction causing immediate rash, facial/tongue/throat swelling, SOB or lightheadedness with hypotension: No Has patient had a PCN reaction causing severe rash involving mucus membranes or skin necrosis: No Has patient had a PCN reaction that required hospitalization No Has patient had a PCN reaction occurring within the last 10 years: No If all of the above answers are "NO", then may proceed with Cephalosporin use.   Marland Kitchen Percocet [Oxycodone-Acetaminophen] Itching     Thank you for allowing pharmacy to be a part of this patient's care.   Adrian Saran, PharmD, BCPS Pager [phone] 09/30/2016 8:48 PM

## 2016-10-01 DIAGNOSIS — A044 Other intestinal Escherichia coli infections: Secondary | ICD-10-CM | POA: Diagnosis not present

## 2016-10-01 DIAGNOSIS — E876 Hypokalemia: Secondary | ICD-10-CM | POA: Diagnosis not present

## 2016-10-01 DIAGNOSIS — B961 Klebsiella pneumoniae [K. pneumoniae] as the cause of diseases classified elsewhere: Secondary | ICD-10-CM

## 2016-10-01 DIAGNOSIS — N390 Urinary tract infection, site not specified: Secondary | ICD-10-CM

## 2016-10-01 DIAGNOSIS — D72829 Elevated white blood cell count, unspecified: Secondary | ICD-10-CM

## 2016-10-01 LAB — COMPREHENSIVE METABOLIC PANEL
ALT: <5 U/L — ABNORMAL LOW (ref 14–54)
AST: 18 U/L (ref 15–41)
Albumin: 2.9 g/dL — ABNORMAL LOW (ref 3.5–5.0)
Alkaline Phosphatase: 55 U/L (ref 38–126)
Anion gap: 6 (ref 5–15)
BILIRUBIN TOTAL: 0.8 mg/dL (ref 0.3–1.2)
BUN: 12 mg/dL (ref 6–20)
CHLORIDE: 102 mmol/L (ref 101–111)
CO2: 28 mmol/L (ref 22–32)
CREATININE: 0.65 mg/dL (ref 0.44–1.00)
Calcium: 8.5 mg/dL — ABNORMAL LOW (ref 8.9–10.3)
Glucose, Bld: 123 mg/dL — ABNORMAL HIGH (ref 65–99)
POTASSIUM: 3.1 mmol/L — AB (ref 3.5–5.1)
Sodium: 136 mmol/L (ref 135–145)
TOTAL PROTEIN: 5.2 g/dL — AB (ref 6.5–8.1)

## 2016-10-01 LAB — CBC
HCT: 34.3 % — ABNORMAL LOW (ref 36.0–46.0)
Hemoglobin: 11.4 g/dL — ABNORMAL LOW (ref 12.0–15.0)
MCH: 33 pg (ref 26.0–34.0)
MCHC: 33.2 g/dL (ref 30.0–36.0)
MCV: 99.4 fL (ref 78.0–100.0)
PLATELETS: 208 10*3/uL (ref 150–400)
RBC: 3.45 MIL/uL — AB (ref 3.87–5.11)
RDW: 14.1 % (ref 11.5–15.5)
WBC: 12.2 10*3/uL — AB (ref 4.0–10.5)

## 2016-10-01 LAB — URINE CULTURE: Culture: >=100000 — AB

## 2016-10-01 MED ORDER — POTASSIUM CHLORIDE CRYS ER 20 MEQ PO TBCR
40.0000 meq | EXTENDED_RELEASE_TABLET | Freq: Once | ORAL | Status: AC
  Administered 2016-10-01: 40 meq via ORAL
  Filled 2016-10-01: qty 2

## 2016-10-01 NOTE — Clinical Social Work Note (Signed)
MSW met with patient's spouse, Wynonia Lawman and son in waiting area to discuss potential post-acute placement within SNF. MSW explained OBS status and requirements for SNF under Medicare guidelines. MSW further explained that Medicare with NOT cover ST SNF placement for patient's under OBS and/or w/o 3 night qualifying stays. Patient's husband and son expressed understanding. MSW offered option for private pay at SNF. Pt's family reported that they are unable to pay privately and will prepare for patient to return home once medically stable for discharge.   Patient's husband shared that family has started [facility] application. MSW explained Medicaid process could take up to 45 days.   Patient's husband and son very pleasant, polite and appreciated social work intervention. Family is supportive and strongly involved in patient's care planning. Family also knowledgeable of diagnosis and current medical interventions.   No further concerns reported at this time. MSW encouraged family to contact MSW as needed.   Medical Social Worker will sign off for now as social work intervention is no longer needed. Please consult [place] again if new need arises.  Glendon Axe, MSW [phone] 10/01/2016 3:25 PM

## 2016-10-01 NOTE — Progress Notes (Signed)
Notified patient's husband [facility]) at 11:15pm about transfer to telemetry.

## 2016-10-01 NOTE — Progress Notes (Addendum)
Patient ID: Tara Neal, female   DOB: March 09, 1941, 75 y.o.   MRN: [id number]  PROGRESS NOTE    ACCALIA URUCHIMA  [id number] DOB: March 17, 1941 DOA: 09/29/2016  PCP: [name], MD   Brief Narrative:  75 yo female with chronic back pain, cervical spinal stenosis and foraminal stenosis, lumbar fracture and lumbar spinal stenosis and foraminal stenosis, carpal tunnel syndrome, osteopenia and chronic diarrhea who was brought to ED with worsening back pain and weakness and worsening diarrhea. Patient was seen by interventional radiology in consultation and plan is for outpatient kyphoplasty. Stool for C. difficile was negative but virus panel showed Enteopathogenic E coli..  Assessment & Plan:  Acute Compression fracture / chronic back pain/  - T11 with mild canal stenosis / compression fracture; T10 inferior  endplate fracture  - Patient also has severe DJD, mod to severe spinal stenosis L2-3/moderate canal narrowing L3-4/L4-5, severe foraminal stenosis L5-S1 in the left and bilateral foraminal stenosis at L2-3 and right forminal stenosis L3-4. She had previous cervical MRI that showed spinal stenosis and foraminal stenosis as well.  - No evidence of cord injury - Per IR, VP vs KP as an outpatient once she improves and is discharged from the hospital  Gait instability - Likely in the setting of compression fracture  - Per physical therapy, home health PT recommended, orders placed  - Continue sinemet   Enteropathogenic Escherichia coli enteritis / Dehydration  - Patient is on ciprofloxacin for urinary tract infection. She does not have other strains of Escherichia coli so the ciprofloxacin should be okay to continue  - Diarrhea still there at least 4-5 bowels a day what is reported by family  - C. difficile negative   - We will continue IV fluids for hydration   Essential hypertension - Continue Lotensin and atenolol  Klebsiella UTI / leukocytosis - Continue ciprofloxacin  Dyslipidemia -  Continue simvastatin 40 mg at bedtime  Hypokalemia - Due to GI losses - Will supplement - Follow-up BMP tomorrow morning    DVT prophylaxis: Lovenox subcutaneous Code Status: full code  Family Communication: No family at the bedside; called pt spouse at [phone] to give an update, no option to leave VM on cell phone, will continue to try to get in touch with the family  Disposition Plan:  Recheck potassium in am and if WNL pt can go home; home health orders placed    Consultants:   IR  Procedures:   None   Antimicrobials:   Cipro   Subjective: No overnight events.   Objective: Vitals:   09/30/16 0912 09/30/16 1433 09/30/16 2237 10/01/16 0617  BP: (!) 150/95 126/63 128/65 115/62  Pulse: 75 72 68 67  Resp:  14 18 16   Temp:  98.1 F (36.7 C) 98.4 F (36.9 C) 97.9 F (36.6 C)  TempSrc:  Axillary Oral Oral  SpO2: 100% 99% 98% 100%  Weight:      Height:        Intake/Output Summary (Last 24 hours) at 10/01/16 1138 Last data filed at 10/01/16 0600  Gross per 24 hour  Intake             1920 ml  Output              300 ml  Net             1620 ml   Filed Weights   09/29/16 1049  Weight: 59 kg (130 lb)    Examination:  General exam: Appears  calm and comfortable  Respiratory system: Clear to auscultation. Respiratory effort normal. Cardiovascular system: S1 & S2 heard, RRR. Gastrointestinal system: Abdomen is nondistended, soft and nontender. No organomegaly or masses felt. Normal bowel sounds heard. Central nervous system: No focal neurological deficits. Extremities: Symmetric 5 x 5 power. Skin: No rashes, lesions or ulcers Psychiatry: Normal mood and behavior   Data Reviewed: I have personally reviewed following labs and imaging studies  CBC:  Recent Labs Lab 09/29/16 1128 10/01/16 0345  WBC 8.5 12.2*  HGB 12.4 11.4*  HCT 38.0 34.3*  MCV 99.5 99.4  PLT 233 [id number]   Basic Metabolic Panel:  Recent Labs Lab 09/29/16 1128 09/30/16 2055  10/01/16 0345  NA 136 134* 136  K 4.3 3.2* 3.1*  CL 103 101 102  CO2 28 26 28   GLUCOSE 101* 148* 123*  BUN 14 15 12   CREATININE 0.70 0.73 0.65  CALCIUM 8.9 8.7* 8.5*  MG  --  1.8  --    GFR: Estimated Creatinine Clearance: 53.3 mL/min (by C-G formula based on SCr of 0.65 mg/dL). Liver Function Tests:  Recent Labs Lab 09/29/16 1128 10/01/16 0345  AST 26 18  ALT 9* <5*  ALKPHOS 68 55  BILITOT 0.7 0.8  PROT 6.4* 5.2*  ALBUMIN 3.6 2.9*    Recent Labs Lab 09/29/16 1128  LIPASE 20   No results for input(s): AMMONIA in the last 168 hours. Coagulation Profile: No results for input(s): INR, PROTIME in the last 168 hours. Cardiac Enzymes: No results for input(s): CKTOTAL, CKMB, CKMBINDEX, TROPONINI in the last 168 hours. BNP (last 3 results) No results for input(s): PROBNP in the last 8760 hours. HbA1C: No results for input(s): HGBA1C in the last 72 hours. CBG: No results for input(s): GLUCAP in the last 168 hours. Lipid Profile: No results for input(s): CHOL, HDL, LDLCALC, TRIG, CHOLHDL, LDLDIRECT in the last 72 hours. Thyroid Function Tests: No results for input(s): TSH, T4TOTAL, FREET4, T3FREE, THYROIDAB in the last 72 hours. Anemia Panel: No results for input(s): VITAMINB12, FOLATE, FERRITIN, TIBC, IRON, RETICCTPCT in the last 72 hours. Urine analysis:    Component Value Date/Time   COLORURINE YELLOW 09/29/2016 1128   APPEARANCEUR CLOUDY (A) 09/29/2016 1128   LABSPEC 1.014 09/29/2016 1128   PHURINE 7.0 09/29/2016 1128   GLUCOSEU NEGATIVE 09/29/2016 1128   GLUCOSEU NEGATIVE 07/08/2016 1445   HGBUR NEGATIVE 09/29/2016 1128   BILIRUBINUR NEGATIVE 09/29/2016 1128   KETONESUR NEGATIVE 09/29/2016 1128   PROTEINUR NEGATIVE 09/29/2016 1128   UROBILINOGEN 1.0 07/08/2016 1445   NITRITE POSITIVE (A) 09/29/2016 1128   LEUKOCYTESUR NEGATIVE 09/29/2016 1128   Sepsis Labs: @LABRCNTIP (procalcitonin:4,lacticidven:4)   Urine culture     Status: Abnormal   Collection  Time: 09/29/16 11:28 AM  Result Value Ref Range Status   Specimen Description URINE, RANDOM  Final   Special Requests NONE  Final   Culture >=100,000 COLONIES/mL KLEBSIELLA PNEUMONIAE (A)  Final   Report Status 10/01/2016 FINAL  Final   Organism ID, Bacteria KLEBSIELLA PNEUMONIAE (A)  Final      Susceptibility   Klebsiella pneumoniae - MIC*    AMPICILLIN >=32 RESISTANT Resistant     CEFAZOLIN <=4 SENSITIVE Sensitive     CEFTRIAXONE <=1 SENSITIVE Sensitive     CIPROFLOXACIN <=0.25 SENSITIVE Sensitive     GENTAMICIN <=1 SENSITIVE Sensitive     IMIPENEM <=0.25 SENSITIVE Sensitive     NITROFURANTOIN 64 INTERMEDIATE Intermediate     TRIMETH/SULFA >=320 RESISTANT Resistant     AMPICILLIN/SULBACTAM 4 SENSITIVE  Sensitive     PIP/TAZO <=4 SENSITIVE Sensitive     Extended ESBL NEGATIVE Sensitive     * >=100,000 COLONIES/mL KLEBSIELLA PNEUMONIAE  C difficile quick scan w PCR reflex     Status: None   Collection Time: 09/29/16  1:40 PM  Result Value Ref Range Status   C Diff antigen NEGATIVE NEGATIVE Final   C Diff toxin NEGATIVE NEGATIVE Final   C Diff interpretation No C. difficile detected.  Final  Gastrointestinal Panel by PCR , Stool     Status: Abnormal   Collection Time: 09/30/16  1:02 PM  Result Value Ref Range Status   Campylobacter species NOT DETECTED NOT DETECTED Final   Plesimonas shigelloides NOT DETECTED NOT DETECTED Final   Salmonella species NOT DETECTED NOT DETECTED Final   Yersinia enterocolitica NOT DETECTED NOT DETECTED Final   Vibrio species NOT DETECTED NOT DETECTED Final   Vibrio cholerae NOT DETECTED NOT DETECTED Final   Enteroaggregative E coli (EAEC) NOT DETECTED NOT DETECTED Final   Enteropathogenic E coli (EPEC) DETECTED (A) NOT DETECTED Final   Enterotoxigenic E coli (ETEC) NOT DETECTED NOT DETECTED Final   Shiga like toxin producing E coli (STEC) NOT DETECTED NOT DETECTED Final   E. coli O157 NOT DETECTED NOT DETECTED Final   Shigella/Enteroinvasive E coli  (EIEC) NOT DETECTED NOT DETECTED Final   Cryptosporidium NOT DETECTED NOT DETECTED Final   Cyclospora cayetanensis NOT DETECTED NOT DETECTED Final   Entamoeba histolytica NOT DETECTED NOT DETECTED Final   Giardia lamblia NOT DETECTED NOT DETECTED Final   Adenovirus F40/41 NOT DETECTED NOT DETECTED Final   Astrovirus NOT DETECTED NOT DETECTED Final   Norovirus GI/GII NOT DETECTED NOT DETECTED Final   Rotavirus A NOT DETECTED NOT DETECTED Final   Sapovirus (I, II, IV, and V) NOT DETECTED NOT DETECTED Final      Radiology Studies: Dg Chest 2 View Result Date: 09/29/2016  1. Likely recent T11 vertebral fracture with 65% height loss and 5 mm retropulsion resulting in mild spinal stenosis. 2. T10 inferior endplate fracture without significant height loss. 3. Small right paracentral disc protrusion at T10-11 with mild spinal canal narrowing.   Mr Thoracic Spine Wo Contrast Result Date: 09/29/2016  1. Sequelae of interval vertebral augmentation at L5 without complication. 2. No other acute traumatic injury within the lumbar spine. Stable chronic L3 fracture. 3. 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 4. Moderate to severe spinal stenosis at L2-3, with more moderate canal narrowing at L3-4 and L4-5. 5. Severe left foraminal stenosis at L5-S1 with compression of the L5 nerve root. 6. Severe bilateral foraminal stenosis at L2-3, with severe right foraminal stenosis at L3-4. Electronically Signed   By: Jeannine Boga M.D.   On: 09/29/2016 22:52   Mr Lumbar Spine Wo Contrast Result Date: 09/29/2016 1. Sequelae of interval vertebral augmentation at L5 without complication. 2. No other acute traumatic injury within the lumbar spine. Stable chronic L3 fracture. 3. 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 4. Moderate to severe spinal stenosis at L2-3, with more moderate canal narrowing at L3-4 and L4-5. 5. Severe left foraminal stenosis at L5-S1 with compression of the L5 nerve root. 6. Severe bilateral foraminal stenosis at L2-3, with severe right foraminal stenosis at L3-4. Electronically Signed   By: Jeannine Boga M.D.   On: 09/29/2016 22:52   Dg Op  Swallowing Func-medicare/speech Path Result Date: 09/27/2016 Trace laryngeal penetration without aspiration. Question tiny Zenker's diverticulum. Slightly delayed clearance of ingested barium tablet. Please refer to the Speech Pathologists report for complete details and recommendations.    Scheduled Meds: . atenolol  50 mg Oral Daily  . benazepril  40 mg Oral Daily  . calcium carbonate  1,250 mg Oral BID WC  . carbidopa-levodopa  1 tablet Oral TID  . cholecalciferol  1,000 Units Oral Daily  . ciprofloxacin  500 mg Oral BID  . enoxaparin (LOVENOX) injection  40 mg Subcutaneous Q24H  . feeding supplement (ENSURE ENLIVE)  237 mL Oral BID BM  . folic acid  1 mg Oral Daily  . gabapentin  300 mg Oral TID  . latanoprost  1 drop Both Eyes QHS  . multivitamin with minerals  1 tablet Oral Daily  . pantoprazole  40 mg Oral Daily  . simvastatin  40 mg Oral q1800  . timolol  1 drop Both Eyes BID  . venlafaxine XR  150 mg Oral Q breakfast  . vitamin C  1,000 mg Oral Daily   Continuous Infusions: . dextrose 5 % and 0.45% NaCl 75 mL/hr at 10/01/16 0422     LOS: 1 day    Time spent: 25 minutes  Greater than 50% of the time spent on counseling and coordinating the care.   Leisa Lenz, MD [facility] Pager [phone]  If 7PM-7AM, please contact night-coverage [email or web address] Password [email or web address] 10/01/2016, 11:38 AM

## 2016-10-01 NOTE — Clinical Social Work Note (Signed)
MSW notified that patient's husband interested in SNF placement due to being unsure if he can care for patient at home.   MSW has reviewed chart and noted that patient is currently under an observation status. Medicare will NOT cover SNF placement. Patient and spouse will have to pay privately. MSW attempted to contact patient's husband, Wynonia Lawman to relay this information however unable to leave vm.   MSW to visit at bedside.   Glendon Axe, MSW [phone] 10/01/2016 1:37 PM

## 2016-10-01 NOTE — Progress Notes (Signed)
Physical Therapy Treatment Patient Details Name: AALANI MCNELLIS MRN: [id number] DOB: 05/08/1941 Today's Date: 10-13-2016    History of Present Illness 75 y.o. female admitted with diarrhea, frequent falls. Dx of acute T10, T11 compression fractures with canal stenosis. PMH of HTN, OA, back surgeries.     PT Comments    Pt assisted with hygiene after standing with small BM on linen and then ambulated in room.  Pt reports increased fatigue with activity and assisted back to bed.  Follow Up Recommendations  Home health PT;Other (comment) (aide)     Equipment Recommendations  None recommended by PT    Recommendations for Other Services       Precautions / Restrictions Precautions Precautions: Fall    Mobility  Bed Mobility Overal bed mobility: Modified Independent             General bed mobility comments: increased time  Transfers Overall transfer level: Needs assistance Equipment used: Rolling walker (2 wheeled) Transfers: Sit to/from Stand Sit to Stand: Mod assist         General transfer comment: VCs hand placement, Assist to rise, posterior lean initially in standing, pt with loose BM, provided pericare and barrier cream  Ambulation/Gait Ambulation/Gait assistance: Min assist Ambulation Distance (Feet): 22 Feet Assistive device: Rolling walker (2 wheeled) Gait Pattern/deviations: Step-through pattern;Narrow base of support     General Gait Details: initial scissoring however steadiness and gait improved with more distance, remained in room (in case of BM)   Stairs            Wheelchair Mobility    Modified Rankin (Stroke Patients Only)       Balance                                    Cognition Arousal/Alertness: Awake/alert Behavior During Therapy: WFL for tasks assessed/performed Overall Cognitive Status: Within Functional Limits for tasks assessed                      Exercises      General Comments         Pertinent Vitals/Pain Pain Assessment: 0-10 Pain Score: 4  Pain Location: mid back Pain Descriptors / Indicators: Aching Pain Intervention(s): Limited activity within patient's tolerance;Monitored during session;Repositioned    Home Living                      Prior Function            PT Goals (current goals can now be found in the care plan section) Progress towards PT goals: Progressing toward goals    Frequency    Min 3X/week      PT Plan Current plan remains appropriate    Co-evaluation             End of Session Equipment Utilized During Treatment: Gait belt Activity Tolerance: Patient limited by fatigue Patient left: with call bell/phone within reach;in bed;with bed alarm set     Time: [id number] PT Time Calculation (min) (ACUTE ONLY): 22 min  Charges:  $Gait Training: 8-22 mins                    G Codes:      Shaquanda Graves,KATHrine E October 13, 2016, 1:19 PM Carmelia Bake, PT, DPT 10/13/16 Pager: [phone]

## 2016-10-02 DIAGNOSIS — E440 Moderate protein-calorie malnutrition: Secondary | ICD-10-CM | POA: Diagnosis present

## 2016-10-02 DIAGNOSIS — E785 Hyperlipidemia, unspecified: Secondary | ICD-10-CM

## 2016-10-02 DIAGNOSIS — M546 Pain in thoracic spine: Secondary | ICD-10-CM | POA: Diagnosis not present

## 2016-10-02 DIAGNOSIS — S22080D Wedge compression fracture of T11-T12 vertebra, subsequent encounter for fracture with routine healing: Secondary | ICD-10-CM | POA: Diagnosis not present

## 2016-10-02 DIAGNOSIS — E860 Dehydration: Secondary | ICD-10-CM | POA: Diagnosis present

## 2016-10-02 DIAGNOSIS — Z8249 Family history of ischemic heart disease and other diseases of the circulatory system: Secondary | ICD-10-CM | POA: Diagnosis not present

## 2016-10-02 DIAGNOSIS — R197 Diarrhea, unspecified: Secondary | ICD-10-CM | POA: Diagnosis not present

## 2016-10-02 DIAGNOSIS — M4804 Spinal stenosis, thoracic region: Secondary | ICD-10-CM | POA: Diagnosis present

## 2016-10-02 DIAGNOSIS — R1310 Dysphagia, unspecified: Secondary | ICD-10-CM | POA: Diagnosis not present

## 2016-10-02 DIAGNOSIS — M810 Age-related osteoporosis without current pathological fracture: Secondary | ICD-10-CM | POA: Diagnosis present

## 2016-10-02 DIAGNOSIS — R278 Other lack of coordination: Secondary | ICD-10-CM | POA: Diagnosis not present

## 2016-10-02 DIAGNOSIS — R2689 Other abnormalities of gait and mobility: Secondary | ICD-10-CM | POA: Diagnosis not present

## 2016-10-02 DIAGNOSIS — J939 Pneumothorax, unspecified: Secondary | ICD-10-CM | POA: Diagnosis not present

## 2016-10-02 DIAGNOSIS — Z9181 History of falling: Secondary | ICD-10-CM | POA: Diagnosis not present

## 2016-10-02 DIAGNOSIS — G8929 Other chronic pain: Secondary | ICD-10-CM | POA: Diagnosis not present

## 2016-10-02 DIAGNOSIS — G20 Parkinson's disease: Secondary | ICD-10-CM | POA: Diagnosis present

## 2016-10-02 DIAGNOSIS — R296 Repeated falls: Secondary | ICD-10-CM | POA: Diagnosis present

## 2016-10-02 DIAGNOSIS — I10 Essential (primary) hypertension: Secondary | ICD-10-CM | POA: Diagnosis present

## 2016-10-02 DIAGNOSIS — W19XXXD Unspecified fall, subsequent encounter: Secondary | ICD-10-CM | POA: Diagnosis not present

## 2016-10-02 DIAGNOSIS — Z87891 Personal history of nicotine dependence: Secondary | ICD-10-CM | POA: Diagnosis not present

## 2016-10-02 DIAGNOSIS — M6281 Muscle weakness (generalized): Secondary | ICD-10-CM | POA: Diagnosis not present

## 2016-10-02 DIAGNOSIS — B961 Klebsiella pneumoniae [K. pneumoniae] as the cause of diseases classified elsewhere: Secondary | ICD-10-CM | POA: Diagnosis present

## 2016-10-02 DIAGNOSIS — M549 Dorsalgia, unspecified: Secondary | ICD-10-CM | POA: Diagnosis not present

## 2016-10-02 DIAGNOSIS — F0390 Unspecified dementia without behavioral disturbance: Secondary | ICD-10-CM | POA: Diagnosis present

## 2016-10-02 DIAGNOSIS — M4802 Spinal stenosis, cervical region: Secondary | ICD-10-CM | POA: Diagnosis present

## 2016-10-02 DIAGNOSIS — S22000S Wedge compression fracture of unspecified thoracic vertebra, sequela: Secondary | ICD-10-CM | POA: Diagnosis not present

## 2016-10-02 DIAGNOSIS — S22000D Wedge compression fracture of unspecified thoracic vertebra, subsequent encounter for fracture with routine healing: Secondary | ICD-10-CM | POA: Diagnosis not present

## 2016-10-02 DIAGNOSIS — Z833 Family history of diabetes mellitus: Secondary | ICD-10-CM | POA: Diagnosis not present

## 2016-10-02 DIAGNOSIS — M4854XA Collapsed vertebra, not elsewhere classified, thoracic region, initial encounter for fracture: Secondary | ICD-10-CM | POA: Diagnosis present

## 2016-10-02 DIAGNOSIS — M419 Scoliosis, unspecified: Secondary | ICD-10-CM | POA: Diagnosis present

## 2016-10-02 DIAGNOSIS — N3000 Acute cystitis without hematuria: Secondary | ICD-10-CM | POA: Diagnosis not present

## 2016-10-02 DIAGNOSIS — N390 Urinary tract infection, site not specified: Secondary | ICD-10-CM | POA: Diagnosis not present

## 2016-10-02 DIAGNOSIS — M48061 Spinal stenosis, lumbar region without neurogenic claudication: Secondary | ICD-10-CM | POA: Diagnosis present

## 2016-10-02 DIAGNOSIS — M545 Low back pain: Secondary | ICD-10-CM | POA: Diagnosis not present

## 2016-10-02 DIAGNOSIS — A040 Enteropathogenic Escherichia coli infection: Secondary | ICD-10-CM | POA: Diagnosis present

## 2016-10-02 DIAGNOSIS — F419 Anxiety disorder, unspecified: Secondary | ICD-10-CM | POA: Diagnosis present

## 2016-10-02 DIAGNOSIS — J449 Chronic obstructive pulmonary disease, unspecified: Secondary | ICD-10-CM | POA: Diagnosis present

## 2016-10-02 DIAGNOSIS — K219 Gastro-esophageal reflux disease without esophagitis: Secondary | ICD-10-CM | POA: Diagnosis present

## 2016-10-02 LAB — BASIC METABOLIC PANEL
ANION GAP: 5 (ref 5–15)
BUN: 10 mg/dL (ref 6–20)
CHLORIDE: 106 mmol/L (ref 101–111)
CO2: 25 mmol/L (ref 22–32)
Calcium: 8.5 mg/dL — ABNORMAL LOW (ref 8.9–10.3)
Creatinine, Ser: 0.52 mg/dL (ref 0.44–1.00)
GFR calc Af Amer: >60 mL/min (ref >60)
Glucose, Bld: 119 mg/dL — ABNORMAL HIGH (ref 65–99)
POTASSIUM: 3.8 mmol/L (ref 3.5–5.1)
SODIUM: 136 mmol/L (ref 135–145)

## 2016-10-02 MED ORDER — HYDROCODONE-ACETAMINOPHEN 5-325 MG PO TABS: 1.0000 | ORAL_TABLET | Freq: Three times a day (TID) | ORAL | 0 refills | Status: DC | PRN

## 2016-10-02 NOTE — Progress Notes (Addendum)
Patient ID: Tara Neal, female   DOB: [birth year], 75 y.o.   MRN: [id number]  PROGRESS NOTE    Tara Neal  [id number] DOB: 23-Jun-1941 DOA: 09/29/2016  PCP: [name], MD   Brief Narrative:  75 yo female with chronic back pain, cervical spinal stenosis and foraminal stenosis, lumbar fracture and lumbar spinal stenosis and foraminal stenosis, carpal tunnel syndrome, osteopenia and chronic diarrhea who was brought to ED with worsening back pain and weakness and worsening diarrhea. Patient was seen by interventional radiology in consultation and plan is for outpatient kyphoplasty. Stool for C. difficile was negative but virus panel showed Enteopathogenic E coli..  Assessment & Plan:  Acute Compression fracture / chronic back pain - T11 with mild canal stenosis / compression fracture; T10 inferior  endplate fracture  - Patient also has severe DJD, mod to severe spinal stenosis L2-3/moderate canal narrowing L3-4/L4-5, severe foraminal stenosis L5-S1 in the left and bilateral foraminal stenosis at L2-3 and right forminal stenosis L3-4. She had previous cervical MRI that showed spinal stenosis and foraminal stenosis as well.  - No evidence of cord injury - Per IR, VP vs KP as an outpatient once she improves and is discharged from the hospital  Gait instability - Likely in the setting of compression fracture  - Per physical therapy, home health PT recommended; please note per RN and tech report pt requires significant assistance with getting out of bed so we asked if PT can re-evaluate to see if pt quilifies for SNF - Continue sinemet   Enteropathogenic Escherichia coli enteritis / Dehydration  - Patient is on ciprofloxacin for urinary tract infection. She does not have other strains of Escherichia coli so the ciprofloxacin should be okay to continue  - C. difficile negative   - Still with about 4-5 diarrhea to loose BM in am hours  - We will continue IV fluids for hydration   Essential  hypertension - Continue Lotensin and atenolol  Klebsiella UTI / leukocytosis - Continue ciprofloxacin  Dyslipidemia - Continue simvastatin 40 mg at bedtime  Hypokalemia - Due to GI losses - Supplemented and WNL    DVT prophylaxis: Lovenox subcutaneous Code Status: full code  Family Communication: Spoke with spouse 11/3 Disposition Plan:  Recheck potassium in am and if WNL pt can go home; home health orders placed    Consultants:   IR  PT  Procedures:   None   Antimicrobials:   Cipro   Subjective: About 5 episodes of diarrhea in am.   Objective: Vitals:   10/01/16 1321 10/01/16 2219 10/02/16 0543 10/02/16 1516  BP: 116/64 (!) 141/79 (!) 102/52 134/66  Pulse: 72 86 72 71  Resp: 17 15 16 18   Temp: 98 F (36.7 C) 98.4 F (36.9 C) 98.1 F (36.7 C) 98.1 F (36.7 C)  TempSrc: Oral Oral Oral Oral  SpO2: 97% 98% 99% 98%  Weight:      Height:        Intake/Output Summary (Last 24 hours) at 10/02/16 1955 Last data filed at 10/02/16 1517  Gross per 24 hour  Intake             2280 ml  Output                0 ml  Net             2280 ml   Filed Weights   09/29/16 1049  Weight: 59 kg (130 lb)    Examination:  General exam:  Appears calm and comfortable, no distress  Respiratory system: No wheezing and no rhonchi  Cardiovascular system: S1 & S2 heard, Rate controlled . Gastrointestinal system: (+) BS, non tender abdomen  Central nervous system: Non focal  Extremities: No swelling, palpable pulses  Skin: warm, dry  Psychiatry: Normal mood and behavior, no agitation or restlessness   Data Reviewed: I have personally reviewed following labs and imaging studies  CBC:  Recent Labs Lab 09/29/16 1128 10/01/16 0345  WBC 8.5 12.2*  HGB 12.4 11.4*  HCT 38.0 34.3*  MCV 99.5 99.4  PLT 233 [id number]   Basic Metabolic Panel:  Recent Labs Lab 09/29/16 1128 09/30/16 2055 10/01/16 0345 10/02/16 0353  NA 136 134* 136 136  K 4.3 3.2* 3.1* 3.8  CL 103 101  102 106  CO2 28 26 28 25   GLUCOSE 101* 148* 123* 119*  BUN 14 15 12 10   CREATININE 0.70 0.73 0.65 0.52  CALCIUM 8.9 8.7* 8.5* 8.5*  MG  --  1.8  --   --    GFR: Estimated Creatinine Clearance: 53.3 mL/min (by C-G formula based on SCr of 0.52 mg/dL). Liver Function Tests:  Recent Labs Lab 09/29/16 1128 10/01/16 0345  AST 26 18  ALT 9* <5*  ALKPHOS 68 55  BILITOT 0.7 0.8  PROT 6.4* 5.2*  ALBUMIN 3.6 2.9*    Recent Labs Lab 09/29/16 1128  LIPASE 20   No results for input(s): AMMONIA in the last 168 hours. Coagulation Profile: No results for input(s): INR, PROTIME in the last 168 hours. Cardiac Enzymes: No results for input(s): CKTOTAL, CKMB, CKMBINDEX, TROPONINI in the last 168 hours. BNP (last 3 results) No results for input(s): PROBNP in the last 8760 hours. HbA1C: No results for input(s): HGBA1C in the last 72 hours. CBG: No results for input(s): GLUCAP in the last 168 hours. Lipid Profile: No results for input(s): CHOL, HDL, LDLCALC, TRIG, CHOLHDL, LDLDIRECT in the last 72 hours. Thyroid Function Tests: No results for input(s): TSH, T4TOTAL, FREET4, T3FREE, THYROIDAB in the last 72 hours. Anemia Panel: No results for input(s): VITAMINB12, FOLATE, FERRITIN, TIBC, IRON, RETICCTPCT in the last 72 hours. Urine analysis:    Component Value Date/Time   COLORURINE YELLOW 09/29/2016 1128   APPEARANCEUR CLOUDY (A) 09/29/2016 1128   LABSPEC 1.014 09/29/2016 1128   PHURINE 7.0 09/29/2016 1128   GLUCOSEU NEGATIVE 09/29/2016 1128   GLUCOSEU NEGATIVE 07/08/2016 1445   HGBUR NEGATIVE 09/29/2016 1128   BILIRUBINUR NEGATIVE 09/29/2016 1128   KETONESUR NEGATIVE 09/29/2016 1128   PROTEINUR NEGATIVE 09/29/2016 1128   UROBILINOGEN 1.0 07/08/2016 1445   NITRITE POSITIVE (A) 09/29/2016 1128   LEUKOCYTESUR NEGATIVE 09/29/2016 1128   Sepsis Labs: @LABRCNTIP (procalcitonin:4,lacticidven:4)   Urine culture     Status: Abnormal   Collection Time: 09/29/16 11:28 AM  Result  Value Ref Range Status   Specimen Description URINE, RANDOM  Final   Special Requests NONE  Final   Culture >=100,000 COLONIES/mL KLEBSIELLA PNEUMONIAE (A)  Final   Report Status 10/01/2016 FINAL  Final   Organism ID, Bacteria KLEBSIELLA PNEUMONIAE (A)  Final      Susceptibility   Klebsiella pneumoniae - MIC*    AMPICILLIN >=32 RESISTANT Resistant     CEFAZOLIN <=4 SENSITIVE Sensitive     CEFTRIAXONE <=1 SENSITIVE Sensitive     CIPROFLOXACIN <=0.25 SENSITIVE Sensitive     GENTAMICIN <=1 SENSITIVE Sensitive     IMIPENEM <=0.25 SENSITIVE Sensitive     NITROFURANTOIN 64 INTERMEDIATE Intermediate     TRIMETH/SULFA >=  320 RESISTANT Resistant     AMPICILLIN/SULBACTAM 4 SENSITIVE Sensitive     PIP/TAZO <=4 SENSITIVE Sensitive     Extended ESBL NEGATIVE Sensitive     * >=100,000 COLONIES/mL KLEBSIELLA PNEUMONIAE  C difficile quick scan w PCR reflex     Status: None   Collection Time: 09/29/16  1:40 PM  Result Value Ref Range Status   C Diff antigen NEGATIVE NEGATIVE Final   C Diff toxin NEGATIVE NEGATIVE Final   C Diff interpretation No C. difficile detected.  Final  Gastrointestinal Panel by PCR , Stool     Status: Abnormal   Collection Time: 09/30/16  1:02 PM  Result Value Ref Range Status   Campylobacter species NOT DETECTED NOT DETECTED Final   Plesimonas shigelloides NOT DETECTED NOT DETECTED Final   Salmonella species NOT DETECTED NOT DETECTED Final   Yersinia enterocolitica NOT DETECTED NOT DETECTED Final   Vibrio species NOT DETECTED NOT DETECTED Final   Vibrio cholerae NOT DETECTED NOT DETECTED Final   Enteroaggregative E coli (EAEC) NOT DETECTED NOT DETECTED Final   Enteropathogenic E coli (EPEC) DETECTED (A) NOT DETECTED Final   Enterotoxigenic E coli (ETEC) NOT DETECTED NOT DETECTED Final   Shiga like toxin producing E coli (STEC) NOT DETECTED NOT DETECTED Final   E. coli O157 NOT DETECTED NOT DETECTED Final   Shigella/Enteroinvasive E coli (EIEC) NOT DETECTED NOT DETECTED  Final   Cryptosporidium NOT DETECTED NOT DETECTED Final   Cyclospora cayetanensis NOT DETECTED NOT DETECTED Final   Entamoeba histolytica NOT DETECTED NOT DETECTED Final   Giardia lamblia NOT DETECTED NOT DETECTED Final   Adenovirus F40/41 NOT DETECTED NOT DETECTED Final   Astrovirus NOT DETECTED NOT DETECTED Final   Norovirus GI/GII NOT DETECTED NOT DETECTED Final   Rotavirus A NOT DETECTED NOT DETECTED Final   Sapovirus (I, II, IV, and V) NOT DETECTED NOT DETECTED Final      Radiology Studies: Dg Chest 2 View Result Date: 09/29/2016  1. Likely recent T11 vertebral fracture with 65% height loss and 5 mm retropulsion resulting in mild spinal stenosis. 2. T10 inferior endplate fracture without significant height loss. 3. Small right paracentral disc protrusion at T10-11 with mild spinal canal narrowing.   Mr Thoracic Spine Wo Contrast Result Date: 09/29/2016  1. Sequelae of interval vertebral augmentation at L5 without complication. 2. No other acute traumatic injury within the lumbar spine. Stable chronic L3 fracture. 3. 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 4. Moderate to severe spinal stenosis at L2-3, with more moderate canal narrowing at L3-4 and L4-5. 5. Severe left foraminal stenosis at L5-S1 with compression of the L5 nerve root. 6. Severe bilateral foraminal stenosis at L2-3, with severe right foraminal stenosis at L3-4. Electronically Signed   By: Jeannine Boga M.D.   On: 09/29/2016 22:52   Mr Lumbar Spine Wo Contrast Result Date: 09/29/2016 1. Sequelae of interval vertebral augmentation at L5 without complication. 2. No other acute traumatic injury within the lumbar spine. Stable chronic L3 fracture. 3. 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 4. Moderate to  severe spinal stenosis at L2-3, with more moderate canal narrowing at L3-4 and L4-5. 5. Severe left foraminal stenosis at L5-S1 with compression of the L5 nerve root. 6. Severe bilateral foraminal stenosis at L2-3, with severe right foraminal stenosis at L3-4. Electronically Signed   By: Jeannine Boga  M.D.   On: 09/29/2016 22:52   Dg Op Swallowing Func-medicare/speech Path Result Date: 09/27/2016 Trace laryngeal penetration without aspiration. Question tiny Zenker's diverticulum. Slightly delayed clearance of ingested barium tablet. Please refer to the Speech Pathologists report for complete details and recommendations.    Scheduled Meds: . atenolol  50 mg Oral Daily  . benazepril  40 mg Oral Daily  . calcium carbonate  1,250 mg Oral BID WC  . carbidopa-levodopa  1 tablet Oral TID  . cholecalciferol  1,000 Units Oral Daily  . ciprofloxacin  500 mg Oral BID  . enoxaparin (LOVENOX) injection  40 mg Subcutaneous Q24H  . feeding supplement (ENSURE ENLIVE)  237 mL Oral BID BM  . folic acid  1 mg Oral Daily  . gabapentin  300 mg Oral TID  . latanoprost  1 drop Both Eyes QHS  . multivitamin with minerals  1 tablet Oral Daily  . pantoprazole  40 mg Oral Daily  . simvastatin  40 mg Oral q1800  . timolol  1 drop Both Eyes BID  . venlafaxine XR  150 mg Oral Q breakfast  . vitamin C  1,000 mg Oral Daily   Continuous Infusions: . dextrose 5 % and 0.45% NaCl 75 mL/hr at 10/02/16 0824     LOS: 1 day    Time spent: 15 minutes  Greater than 50% of the time spent on counseling and coordinating the care.   Leisa Lenz, MD [facility] Pager [phone]  If 7PM-7AM, please contact night-coverage [email or web address] Password [email or web address] 10/02/2016, 7:55 PM

## 2016-10-03 DIAGNOSIS — M546 Pain in thoracic spine: Secondary | ICD-10-CM

## 2016-10-03 DIAGNOSIS — G8929 Other chronic pain: Secondary | ICD-10-CM

## 2016-10-03 NOTE — Progress Notes (Addendum)
Patient ID: Tara Neal, female   DOB: 1941/10/28, 75 y.o.   MRN: [id number]  PROGRESS NOTE    Tara Neal  [id number] DOB: Apr 08, 1941 DOA: 09/29/2016  PCP: [name], MD   Brief Narrative:  75 yo female with chronic back pain, cervical spinal stenosis and foraminal stenosis, lumbar fracture and lumbar spinal stenosis and foraminal stenosis, carpal tunnel syndrome, osteopenia and chronic diarrhea who was brought to ED with worsening back pain and weakness and worsening diarrhea. Patient was seen by interventional radiology in consultation and plan is for outpatient kyphoplasty. Stool for C. difficile was negative but virus panel showed Enteopathogenic E coli..  Assessment & Plan:  Acute Compression fracture / chronic back pain - T11 with mild canal stenosis / compression fracture; T10 inferior  endplate fracture  - Patient also has severe DJD, mod to severe spinal stenosis L2-3/moderate canal narrowing L3-4/L4-5, severe foraminal stenosis L5-S1 in the left and bilateral foraminal stenosis at L2-3 and right forminal stenosis L3-4. She had previous cervical MRI that showed spinal stenosis and foraminal stenosis as well.  - No evidence of cord injury - Per IR, VP vs KP as an outpatient once she improves and is discharged from the hospital  Gait instability - Likely in the setting of compression fracture  - Per physical therapy, home health PT recommended but we asked them to re-eval the pt as pt does need at least 2 person assist even trying to get from bed to commode  - Continue sinemet   Enteropathogenic Escherichia coli enteritis / Dehydration  - Patient is on ciprofloxacin for urinary tract infection. She does not have other strains of Escherichia coli so the ciprofloxacin should be okay to continue  - C. difficile negative   - Diarrhea improved this am   Essential hypertension - Continue Lotensin and atenolol  Klebsiella UTI / leukocytosis - Continue ciprofloxacin through  10/05/2016  Dyslipidemia - Continue simvastatin 40 mg at bedtime  Hypokalemia - Due to GI losses - Supplemented and WNL    DVT prophylaxis: Lovenox subcutaneous Code Status: full code  Family Communication: Spoke with spouse 11/3, 11/5 Disposition Plan:  Hopefully she can qualify for SNF and go there Monday but we need repeat PT eval    Consultants:   IR  PT  Procedures:   None   Antimicrobials:   Cipro   Subjective: No overnight events.  Objective: Vitals:   10/02/16 0543 10/02/16 1516 10/02/16 2121 10/03/16 0659  BP: (!) 102/52 134/66 127/69 (!) 150/72  Pulse: 72 71 70 68  Resp: 16 18 19 17   Temp: 98.1 F (36.7 C) 98.1 F (36.7 C) 97.6 F (36.4 C) 97.7 F (36.5 C)  TempSrc: Oral Oral Oral Oral  SpO2: 99% 98% 98% 100%  Weight:      Height:        Intake/Output Summary (Last 24 hours) at 10/03/16 0820 Last data filed at 10/03/16 0600  Gross per 24 hour  Intake             2280 ml  Output                0 ml  Net             2280 ml   Filed Weights   09/29/16 1049  Weight: 59 kg (130 lb)    Examination:  General exam: Appears calm and comfortable Respiratory system: No wheezing, no rhonchi, bilateral air entry  Cardiovascular system: S1 & S2 heard, Rate  controlled Gastrointestinal system: (+) BS, no distention  Central nervous system: No focal deficits  Extremities: No swelling, palpable pulses bilaterally  Skin: warm, dry  Psychiatry: Normal mood and behavior  Data Reviewed: I have personally reviewed following labs and imaging studies  CBC:  Recent Labs Lab 09/29/16 1128 10/01/16 0345  WBC 8.5 12.2*  HGB 12.4 11.4*  HCT 38.0 34.3*  MCV 99.5 99.4  PLT 233 [id number]   Basic Metabolic Panel:  Recent Labs Lab 09/29/16 1128 09/30/16 2055 10/01/16 0345 10/02/16 0353  NA 136 134* 136 136  K 4.3 3.2* 3.1* 3.8  CL 103 101 102 106  CO2 28 26 28 25   GLUCOSE 101* 148* 123* 119*  BUN 14 15 12 10   CREATININE 0.70 0.73 0.65 0.52    CALCIUM 8.9 8.7* 8.5* 8.5*  MG  --  1.8  --   --    GFR: Estimated Creatinine Clearance: 53.3 mL/min (by C-G formula based on SCr of 0.52 mg/dL). Liver Function Tests:  Recent Labs Lab 09/29/16 1128 10/01/16 0345  AST 26 18  ALT 9* <5*  ALKPHOS 68 55  BILITOT 0.7 0.8  PROT 6.4* 5.2*  ALBUMIN 3.6 2.9*    Recent Labs Lab 09/29/16 1128  LIPASE 20   No results for input(s): AMMONIA in the last 168 hours. Coagulation Profile: No results for input(s): INR, PROTIME in the last 168 hours. Cardiac Enzymes: No results for input(s): CKTOTAL, CKMB, CKMBINDEX, TROPONINI in the last 168 hours. BNP (last 3 results) No results for input(s): PROBNP in the last 8760 hours. HbA1C: No results for input(s): HGBA1C in the last 72 hours. CBG: No results for input(s): GLUCAP in the last 168 hours. Lipid Profile: No results for input(s): CHOL, HDL, LDLCALC, TRIG, CHOLHDL, LDLDIRECT in the last 72 hours. Thyroid Function Tests: No results for input(s): TSH, T4TOTAL, FREET4, T3FREE, THYROIDAB in the last 72 hours. Anemia Panel: No results for input(s): VITAMINB12, FOLATE, FERRITIN, TIBC, IRON, RETICCTPCT in the last 72 hours. Urine analysis:    Component Value Date/Time   COLORURINE YELLOW 09/29/2016 1128   APPEARANCEUR CLOUDY (A) 09/29/2016 1128   LABSPEC 1.014 09/29/2016 1128   PHURINE 7.0 09/29/2016 1128   GLUCOSEU NEGATIVE 09/29/2016 1128   GLUCOSEU NEGATIVE 07/08/2016 1445   HGBUR NEGATIVE 09/29/2016 1128   BILIRUBINUR NEGATIVE 09/29/2016 1128   KETONESUR NEGATIVE 09/29/2016 1128   PROTEINUR NEGATIVE 09/29/2016 1128   UROBILINOGEN 1.0 07/08/2016 1445   NITRITE POSITIVE (A) 09/29/2016 1128   LEUKOCYTESUR NEGATIVE 09/29/2016 1128   Sepsis Labs: @LABRCNTIP (procalcitonin:4,lacticidven:4)   Urine culture     Status: Abnormal   Collection Time: 09/29/16 11:28 AM  Result Value Ref Range Status   Specimen Description URINE, RANDOM  Final   Special Requests NONE  Final   Culture  >=100,000 COLONIES/mL KLEBSIELLA PNEUMONIAE (A)  Final   Report Status 10/01/2016 FINAL  Final   Organism ID, Bacteria KLEBSIELLA PNEUMONIAE (A)  Final      Susceptibility   Klebsiella pneumoniae - MIC*    AMPICILLIN >=32 RESISTANT Resistant     CEFAZOLIN <=4 SENSITIVE Sensitive     CEFTRIAXONE <=1 SENSITIVE Sensitive     CIPROFLOXACIN <=0.25 SENSITIVE Sensitive     GENTAMICIN <=1 SENSITIVE Sensitive     IMIPENEM <=0.25 SENSITIVE Sensitive     NITROFURANTOIN 64 INTERMEDIATE Intermediate     TRIMETH/SULFA >=320 RESISTANT Resistant     AMPICILLIN/SULBACTAM 4 SENSITIVE Sensitive     PIP/TAZO <=4 SENSITIVE Sensitive     Extended ESBL NEGATIVE  Sensitive     * >=100,000 COLONIES/mL KLEBSIELLA PNEUMONIAE  C difficile quick scan w PCR reflex     Status: None   Collection Time: 09/29/16  1:40 PM  Result Value Ref Range Status   C Diff antigen NEGATIVE NEGATIVE Final   C Diff toxin NEGATIVE NEGATIVE Final   C Diff interpretation No C. difficile detected.  Final  Gastrointestinal Panel by PCR , Stool     Status: Abnormal   Collection Time: 09/30/16  1:02 PM  Result Value Ref Range Status   Campylobacter species NOT DETECTED NOT DETECTED Final   Plesimonas shigelloides NOT DETECTED NOT DETECTED Final   Salmonella species NOT DETECTED NOT DETECTED Final   Yersinia enterocolitica NOT DETECTED NOT DETECTED Final   Vibrio species NOT DETECTED NOT DETECTED Final   Vibrio cholerae NOT DETECTED NOT DETECTED Final   Enteroaggregative E coli (EAEC) NOT DETECTED NOT DETECTED Final   Enteropathogenic E coli (EPEC) DETECTED (A) NOT DETECTED Final   Enterotoxigenic E coli (ETEC) NOT DETECTED NOT DETECTED Final   Shiga like toxin producing E coli (STEC) NOT DETECTED NOT DETECTED Final   E. coli O157 NOT DETECTED NOT DETECTED Final   Shigella/Enteroinvasive E coli (EIEC) NOT DETECTED NOT DETECTED Final   Cryptosporidium NOT DETECTED NOT DETECTED Final   Cyclospora cayetanensis NOT DETECTED NOT DETECTED  Final   Entamoeba histolytica NOT DETECTED NOT DETECTED Final   Giardia lamblia NOT DETECTED NOT DETECTED Final   Adenovirus F40/41 NOT DETECTED NOT DETECTED Final   Astrovirus NOT DETECTED NOT DETECTED Final   Norovirus GI/GII NOT DETECTED NOT DETECTED Final   Rotavirus A NOT DETECTED NOT DETECTED Final   Sapovirus (I, II, IV, and V) NOT DETECTED NOT DETECTED Final      Radiology Studies: Dg Chest 2 View Result Date: 09/29/2016  1. Likely recent T11 vertebral fracture with 65% height loss and 5 mm retropulsion resulting in mild spinal stenosis. 2. T10 inferior endplate fracture without significant height loss. 3. Small right paracentral disc protrusion at T10-11 with mild spinal canal narrowing.   Mr Thoracic Spine Wo Contrast Result Date: 09/29/2016  1. Sequelae of interval vertebral augmentation at L5 without complication. 2. No other acute traumatic injury within the lumbar spine. Stable chronic L3 fracture. 3. 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 4. Moderate to severe spinal stenosis at L2-3, with more moderate canal narrowing at L3-4 and L4-5. 5. Severe left foraminal stenosis at L5-S1 with compression of the L5 nerve root. 6. Severe bilateral foraminal stenosis at L2-3, with severe right foraminal stenosis at L3-4. Electronically Signed   By: Jeannine Boga M.D.   On: 09/29/2016 22:52   Mr Lumbar Spine Wo Contrast Result Date: 09/29/2016 1. Sequelae of interval vertebral augmentation at L5 without complication. 2. No other acute traumatic injury within the lumbar spine. Stable chronic L3 fracture. 3. 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 4. Moderate to severe spinal stenosis at L2-3, with more moderate canal narrowing at L3-4 and L4-5. 5. Severe left foraminal  stenosis at L5-S1 with compression of the L5 nerve root. 6. Severe bilateral foraminal stenosis at L2-3, with severe right foraminal stenosis at L3-4. Electronically Signed   By: Jeannine Boga M.D.   On: 09/29/2016 22:52   Dg Op Swallowing Func-medicare/speech Path Result Date: 09/27/2016 Trace laryngeal penetration without aspiration. Question tiny Zenker's diverticulum. Slightly  delayed clearance of ingested barium tablet. Please refer to the Speech Pathologists report for complete details and recommendations.    Scheduled Meds: . atenolol  50 mg Oral Daily  . benazepril  40 mg Oral Daily  . calcium carbonate  1,250 mg Oral BID WC  . carbidopa-levodopa  1 tablet Oral TID  . cholecalciferol  1,000 Units Oral Daily  . ciprofloxacin  500 mg Oral BID  . enoxaparin (LOVENOX) injection  40 mg Subcutaneous Q24H  . feeding supplement (ENSURE ENLIVE)  237 mL Oral BID BM  . folic acid  1 mg Oral Daily  . gabapentin  300 mg Oral TID  . latanoprost  1 drop Both Eyes QHS  . multivitamin with minerals  1 tablet Oral Daily  . pantoprazole  40 mg Oral Daily  . simvastatin  40 mg Oral q1800  . timolol  1 drop Both Eyes BID  . venlafaxine XR  150 mg Oral Q breakfast  . vitamin C  1,000 mg Oral Daily   Continuous Infusions: . dextrose 5 % and 0.45% NaCl 75 mL/hr at 10/02/16 2203     LOS: 2 days    Time spent: 15 minutes  Greater than 50% of the time spent on counseling and coordinating the care.   Leisa Lenz, MD [facility] Pager [phone]  If 7PM-7AM, please contact night-coverage [email or web address] Password [email or web address] 10/03/2016, 8:20 AM

## 2016-10-04 DIAGNOSIS — S22000D Wedge compression fracture of unspecified thoracic vertebra, subsequent encounter for fracture with routine healing: Secondary | ICD-10-CM

## 2016-10-04 DIAGNOSIS — N3000 Acute cystitis without hematuria: Secondary | ICD-10-CM

## 2016-10-04 NOTE — Progress Notes (Signed)
PROGRESS NOTE                                                                                                                                                                                                             Patient Demographics:    Tara Neal, is a 75 y.o. female, DOB - 05/30/1941, [id number]  Admit date - 09/29/2016   Admitting Physician Gennaro Africa, MD  Outpatient Primary MD for the patient is [name], MD  LOS - 3    Chief Complaint  Patient presents with  . Diarrhea       Brief Narrative  75 year old female with chronic back pain, cervical spinal stenosis and foraminal stenosis, lumbar fracture with lumbar spinal stenosis, carpal tunnel syndrome, osteopenia and chronic diarrhea presented to the ED with worsening back pain with weakness and worsening diarrhea. Patient seen by IR with plan on outpatient kyphoplasty. Stool for C. difficile was negative but showed Enterobacter and Escherichia coli.    Subjective:   Patient denies further diarrhea and reports her back pain to be improving.   Assessment  & Plan :   Principal Problem:   Acute Thoracic compression fracture (HCC)  T11 with mild canal stenosis / compression fracture; T10 inferior endplate fracture . Also has severe degenerative joint disease, moderate to severe lumbar spinal stenosis. No cord involvement. IR consult appreciated. Vertebroplasty versus kyphoplasty as outpatient once patient improves.  Unsteady gait - Likely in the setting of compression fracture . -PT recommended home health initially but patient requiring significant assistance and husband unable to take care of her at home. PT asked to reevaluate. Should benefit from short-term rehabilitation.  Enteropathogenic Escherichia coli enteritis / Dehydration  C. difficile negative. On ciprofloxacin for UTI which should cover. Diarrhea has resolved.  Essential  hypertension - Continue Lotensin and atenolol  Klebsiella UTI / leukocytosis - Continue ciprofloxacin through 10/05/2016  Dyslipidemia - Continue simvastatin .  Hypokalemia Secondary to diarrhea. Replenished.     Code Status : Full code  Family Communication  : None at bedside  Disposition Plan  : Asked PT to evaluate. Possibly SNF tomorrow.  Barriers For Discharge : Active symptoms  Consults  :  IR  Procedures  : MRI of the lumbar spine  DVT Prophylaxis  :  Lovenox -  Lab Results  Component Value Date  PLT 208 10/01/2016    Antibiotics  :    Anti-infectives    Start     Dose/Rate Route Frequency Ordered Stop   09/30/16 2200  ciprofloxacin (CIPRO) tablet 500 mg     500 mg Oral 2 times daily 09/30/16 2049          Objective:   Vitals:   10/03/16 2123 10/04/16 0615 10/04/16 0959 10/04/16 1337  BP: 137/60 (!) 150/71 (!) 158/79 (!) 152/81  Pulse: 68 63 70 65  Resp: 18 18  19   Temp: 98.5 F (36.9 C) 98.7 F (37.1 C)  98.6 F (37 C)  TempSrc: Oral Oral  Oral  SpO2: 99% 97%  100%  Weight:      Height:        Wt Readings from Last 3 Encounters:  09/29/16 59 kg (130 lb)  07/08/16 59 kg (130 lb)  07/01/16 59 kg (130 lb)     Intake/Output Summary (Last 24 hours) at 10/04/16 1349 Last data filed at 10/04/16 1343  Gross per 24 hour  Intake             2920 ml  Output              300 ml  Net             2620 ml     Physical Exam  Gen: not in distress HEENT: moist mucosa, supple neck Chest: clear b/l, no added sounds CVS: N S1&S2, no murmurs, GI: soft, NT, ND Musculoskeletal: warm, no edema     Data Review:    CBC  Recent Labs Lab 09/29/16 1128 10/01/16 0345  WBC 8.5 12.2*  HGB 12.4 11.4*  HCT 38.0 34.3*  PLT 233 208  MCV 99.5 99.4  MCH 32.5 33.0  MCHC 32.6 33.2  RDW 14.2 14.1    Chemistries   Recent Labs Lab 09/29/16 1128 09/30/16 2055 10/01/16 0345 10/02/16 0353  NA 136 134* 136 136  K 4.3 3.2* 3.1* 3.8  CL  103 101 102 106  CO2 28 26 28 25   GLUCOSE 101* 148* 123* 119*  BUN 14 15 12 10   CREATININE 0.70 0.73 0.65 0.52  CALCIUM 8.9 8.7* 8.5* 8.5*  MG  --  1.8  --   --   AST 26  --  18  --   ALT 9*  --  <5*  --   ALKPHOS 68  --  55  --   BILITOT 0.7  --  0.8  --    ------------------------------------------------------------------------------------------------------------------ No results for input(s): CHOL, HDL, LDLCALC, TRIG, CHOLHDL, LDLDIRECT in the last 72 hours.  Lab Results  Component Value Date   HGBA1C 5.7 03/31/2016   ------------------------------------------------------------------------------------------------------------------ No results for input(s): TSH, T4TOTAL, T3FREE, THYROIDAB in the last 72 hours.  Invalid input(s): FREET3 ------------------------------------------------------------------------------------------------------------------ No results for input(s): VITAMINB12, FOLATE, FERRITIN, TIBC, IRON, RETICCTPCT in the last 72 hours.  Coagulation profile No results for input(s): INR, PROTIME in the last 168 hours.  No results for input(s): DDIMER in the last 72 hours.  Cardiac Enzymes No results for input(s): CKMB, TROPONINI, MYOGLOBIN in the last 168 hours.  Invalid input(s): CK ------------------------------------------------------------------------------------------------------------------ No results found for: BNP  Inpatient Medications  Scheduled Meds: . atenolol  50 mg Oral Daily  . benazepril  40 mg Oral Daily  . calcium carbonate  1,250 mg Oral BID WC  . carbidopa-levodopa  1 tablet Oral TID  . cholecalciferol  1,000 Units Oral Daily  . ciprofloxacin  500 mg Oral BID  . enoxaparin (LOVENOX) injection  40 mg Subcutaneous Q24H  . feeding supplement (ENSURE ENLIVE)  237 mL Oral BID BM  . folic acid  1 mg Oral Daily  . gabapentin  300 mg Oral TID  . latanoprost  1 drop Both Eyes QHS  . multivitamin with minerals  1 tablet Oral Daily  .  pantoprazole  40 mg Oral Daily  . simvastatin  40 mg Oral q1800  . timolol  1 drop Both Eyes BID  . venlafaxine XR  150 mg Oral Q breakfast  . vitamin C  1,000 mg Oral Daily   Continuous Infusions: . dextrose 5 % and 0.45% NaCl 75 mL/hr at 10/04/16 0544   PRN Meds:.acetaminophen, HYDROcodone-acetaminophen, loperamide, polyethylene glycol  Micro Results Recent Results (from the past 240 hour(s))  Urine culture     Status: Abnormal   Collection Time: 09/29/16 11:28 AM  Result Value Ref Range Status   Specimen Description URINE, RANDOM  Final   Special Requests NONE  Final   Culture >=100,000 COLONIES/mL KLEBSIELLA PNEUMONIAE (A)  Final   Report Status 10/01/2016 FINAL  Final   Organism ID, Bacteria KLEBSIELLA PNEUMONIAE (A)  Final      Susceptibility   Klebsiella pneumoniae - MIC*    AMPICILLIN >=32 RESISTANT Resistant     CEFAZOLIN <=4 SENSITIVE Sensitive     CEFTRIAXONE <=1 SENSITIVE Sensitive     CIPROFLOXACIN <=0.25 SENSITIVE Sensitive     GENTAMICIN <=1 SENSITIVE Sensitive     IMIPENEM <=0.25 SENSITIVE Sensitive     NITROFURANTOIN 64 INTERMEDIATE Intermediate     TRIMETH/SULFA >=320 RESISTANT Resistant     AMPICILLIN/SULBACTAM 4 SENSITIVE Sensitive     PIP/TAZO <=4 SENSITIVE Sensitive     Extended ESBL NEGATIVE Sensitive     * >=100,000 COLONIES/mL KLEBSIELLA PNEUMONIAE  C difficile quick scan w PCR reflex     Status: None   Collection Time: 09/29/16  1:40 PM  Result Value Ref Range Status   C Diff antigen NEGATIVE NEGATIVE Final   C Diff toxin NEGATIVE NEGATIVE Final   C Diff interpretation No C. difficile detected.  Final  Gastrointestinal Panel by PCR , Stool     Status: Abnormal   Collection Time: 09/30/16  1:02 PM  Result Value Ref Range Status   Campylobacter species NOT DETECTED NOT DETECTED Final   Plesimonas shigelloides NOT DETECTED NOT DETECTED Final   Salmonella species NOT DETECTED NOT DETECTED Final   Yersinia enterocolitica NOT DETECTED NOT DETECTED  Final   Vibrio species NOT DETECTED NOT DETECTED Final   Vibrio cholerae NOT DETECTED NOT DETECTED Final   Enteroaggregative E coli (EAEC) NOT DETECTED NOT DETECTED Final   Enteropathogenic E coli (EPEC) DETECTED (A) NOT DETECTED Final    Comment: RESULT CALLED TO, READ BACK BY AND VERIFIED WITH: SPOKE TO [facility] 09/30/16 @ 2008  [place] RESULT CALLED TO, READ BACK BY AND VERIFIED WITH: C. VENNABLE RN AT 2013 ON 09/30/16 BY S.VANHOORNE    Enterotoxigenic E coli (ETEC) NOT DETECTED NOT DETECTED Final   Shiga like toxin producing E coli (STEC) NOT DETECTED NOT DETECTED Final   E. coli O157 NOT DETECTED NOT DETECTED Final   Shigella/Enteroinvasive E coli (EIEC) NOT DETECTED NOT DETECTED Final   Cryptosporidium NOT DETECTED NOT DETECTED Final   Cyclospora cayetanensis NOT DETECTED NOT DETECTED Final   Entamoeba histolytica NOT DETECTED NOT DETECTED Final   Giardia lamblia NOT DETECTED NOT DETECTED Final   Adenovirus F40/41 NOT  DETECTED NOT DETECTED Final   Astrovirus NOT DETECTED NOT DETECTED Final   Norovirus GI/GII NOT DETECTED NOT DETECTED Final   Rotavirus A NOT DETECTED NOT DETECTED Final   Sapovirus (I, II, IV, and V) NOT DETECTED NOT DETECTED Final    Radiology Reports Dg Chest 2 View  Result Date: 09/29/2016 CLINICAL DATA:  Cough for 2-3 days EXAM: CHEST  2 VIEW COMPARISON:  05/04/2016 FINDINGS: The There is no focal parenchymal opacity. There is no pleural effusion or pneumothorax. The heart and mediastinal contours are unremarkable. There is a T11 vertebral body compression fracture new compared with 05/15/2016. IMPRESSION: No active cardiopulmonary disease. Age indeterminate T11 vertebral body compression fracture new compared with 05/15/2016. Electronically Signed   By: Kathreen Devoid   On: 09/29/2016 13:35   Ct Thoracic Spine Wo Contrast  Result Date: 09/29/2016 CLINICAL DATA:  Abnormal chest x-ray with T11 compression fracture. Back pain. Multiple recent falls. History of L5  compression fracture status post augmentation. EXAM: CT THORACIC SPINE WITHOUT CONTRAST TECHNIQUE: Multidetector CT imaging of the thoracic spine was performed without intravenous contrast administration. Multiplanar CT image reconstructions were also generated. COMPARISON:  Chest radiographs 09/29/2016. Lumbar spine MRI 06/02/2016. Lumbar spine CT 05/15/2016. FINDINGS: Alignment: Mild lower thoracic dextroscoliosis. Trace anterolisthesis of C7 on T1. Vertebrae: As described on earlier chest radiographs, there is a T11 superior endplate compression fracture with approximately 65% height loss. This is new from the prior MRI and is felt to be recent as the fracture line is still visible. Retropulsion of the posterior T11 vertebral body cortex measures 5 mm and results in mild spinal stenosis. There is also a new T10 inferior endplate fracture with mild focal depression anteriorly but no significant generalized vertebral body height loss or retropulsion. The bones appear osteopenic. Paraspinal and other soft tissues: Scarring in the lung apices with mild bronchiectasis in the right upper lobe. Mild centrilobular emphysema. Aortic atherosclerosis. Disc levels: Mild disc degeneration in the lower thoracic spine with degenerative endplate sclerosis. There is a small calcified right paracentral disc protrusion at T10-11 which results in mild right-sided spinal stenosis, present on the prior MRI though better visualized on the current examination. Thoracic facet arthrosis is most advanced at T12-L1 on the left. IMPRESSION: 1. Likely recent T11 vertebral fracture with 65% height loss and 5 mm retropulsion resulting in mild spinal stenosis. 2. T10 inferior endplate fracture without significant height loss. 3. Small right paracentral disc protrusion at T10-11 with mild spinal canal narrowing. Electronically Signed   By: Logan Bores M.D.   On: 09/29/2016 16:00   Mr Thoracic Spine Wo Contrast  Result Date:  09/29/2016 CLINICAL DATA:  Initial evaluation for acute back pain, history of multiple recent falls. EXAM: MRI  THORACIC AND LUMBAR SPINE WITHOUT CONTRAST TECHNIQUE: Multiplanar and multiecho pulse sequences of the thoracic and lumbar spine, were obtained without intravenous contrast. COMPARISON:  Comparison made with prior CT from earlier the same day. S the E FINDINGS: MRI THORACIC SPINE FINDINGS Alignment: Accentuation of the normal thoracic kyphosis. Mild dextroscoliosis of the lower thoracic spine. Vertebrae: Abnormal T1 hypointense, stir hyperintense signal intensity extends through the inferior endplate of T1, compatible with acute compression fracture. Minimal height loss of up to 25%. No significant bony retropulsion. There is an additional acute compression fracture involving the T11 vertebral body. Associated height loss of approximately 65%. Bony retropulsion measures 5 mm. Resultant mild spinal canal stenosis. Suspected associated fracture of the T11 spinous process which demonstrates marrow edema, with surrounding edema  within the adjacent soft tissues (series 10, image 40). Vertebral body heights otherwise maintained. No other acute fracture identified. Minimal STIR signal intensity within the T5 vertebral body favored to be related to reactive endplate changes. Signal intensity within the vertebral body bone marrow otherwise within normal limits. Benign hemangioma noted within the T12 and L1 vertebral body. No worrisome osseous lesions. Cord: Signal intensity within the thoracic spinal cord is normal. No evidence for traumatic cord injury. Paraspinal and other soft tissues: Paraspinous soft tissues demonstrate no acute abnormality. Small layering left pleural effusion noted. Disc levels: Limited views of the cervical spine on counter sequence demonstrate reversal of the normal cervical lordosis with multilevel degenerative spondylolysis. Scattered multilevel facet hypertrophy noted throughout much of  the upper and mid thoracic spine. Minimal disc bulge at T2-3 and T8-9. No other significant disc bulging through the T9-10 level. No focal disc protrusions. No canal or foraminal stenosis from the T1-2 level through the T9-10 level. T10-11: Diffuse disc bulge with disc desiccation and intervertebral disc space narrowing. Superimposed right paracentral disc protrusion indents and flattens the ventral thecal sac with mild flattening of the right hemi cord (series 9, image 35). No cord signal changes. Superimposed posterior element hypertrophy. Mild canal and bilateral foraminal stenosis. T11-12: 5 mm bony retropulsion related to the T11 compression fracture. Retro falls ball lung indents the ventral thecal sac results in mild canal stenosis (series 9, image 38). Superimposed mild facet hypertrophy. No significant foraminal encroachment. T12-L1: Diffuse degenerative disc bulge with disc desiccation. Superimposed left paracentral disc protrusion mildly flattens and indents the ventral thecal sac without significant canal stenosis (series 9, image 44). No significant foraminal narrowing. Mild edema about the left T11-12 facet at favored to be degenerative in nature. No definite fracture through the facet itself MRI LUMBAR SPINE FINDINGS Segmentation: Normal segmentation. Lowest well-formed disc is labeled the L5-S1 level. Alignment: 40 degrees of levoscoliosis with apex at L3 again seen, stable. Mild retrolisthesis of L1 on L2 and L2 on L3 also unchanged. Trace anterolisthesis of L5 on S1 also stable. Vertebrae: Chronic degenerative endplate Schmorl's node with associated height loss at the superior endplate of L3 again noted, stable. Since the prior study, there has been interval performance of vertebral augmentation at the L5 level. Vertebral body height at this level it is relatively stable. Known progressive fragmentation or bony retropulsion. Previously seen edema within the inferior endplate of L5 has largely  resolved. Acute fractures involving the T10 and T11 vertebral bodies noted, described on the thoracic portion of this exam. Vertebral body heights otherwise maintained. No other acute fracture. Prominent benign hemangioma again noted at T12 and L1. No worrisome osseous lesions. Signal intensity within the vertebral body bone marrow otherwise within normal limits. Conus medullaris: Extends to the L1-2 level and appears normal. Paraspinal and other soft tissues: Paraspinous soft tissues demonstrate no acute abnormality. Fatty atrophy noted within the paraspinous musculature. Cholelithiasis noted. Visualized visceral structures otherwise grossly unremarkable. Disc levels: L1-2: Trace retrolisthesis. Diffuse degenerative disc bulge with facet hypertrophy. No significant canal stenosis. Mild bilateral foraminal stenosis. L2-3: Trace retrolisthesis. Diffuse degenerative disc bulge with moderate bilateral facet arthrosis with ligamentum flavum hypertrophy. Moderate to severe canal stenosis and right lateral recess stenosis is stable. Fairly severe bilateral foraminal narrowing also unchanged, worse on the right. L3-4: Diffuse degenerative disc bulge with advanced bilateral facet arthrosis, right greater than left. Moderate canal stenosis, stable. Severe right with more mild left foraminal narrowing also stable. L4-5: Diffuse degenerative disc bulge with  advanced facet arthrosis. Moderate canal stenosis, stable. Left greater than right lateral recess narrowing. Mild right with more moderate left foraminal stenosis. L5-S1: Degenerative disc desiccation with mild disc bulge. Severe facet arthrosis. 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 No significant canal stenosis. Severe left foraminal stenosis with probable compression of the left L5 nerve root again noted. More mild to moderate right foraminal stenosis. No complication identified status post recent vertebral augmentation. IMPRESSION: MRI  THORACIC IMPRESSION: 1. Acute T11 compression fracture with 65% height loss and 5 mm bony retropulsion. Resultant mild canal stenosis. 2. Acute compression fracture through the inferior endplate of [id number] with mild 20% height loss without bony retropulsion. 3. No evidence for traumatic cord injury within the thoracic spine. 4. Degenerative disc bulge with right paracentral disc protrusion at T10-11 with resultant mild canal stenosis. 5. Small layering left pleural effusion. MRI LUMBAR IMPRESSION: 1. Sequelae of interval vertebral augmentation at L5 without complication. 2. No other acute traumatic injury within the lumbar spine. Stable chronic L3 fracture. 3. 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 4. Moderate to severe spinal stenosis at L2-3, with more moderate canal narrowing at L3-4 and L4-5. 5. Severe left foraminal stenosis at L5-S1 with compression of the L5 nerve root. 6. Severe bilateral foraminal stenosis at L2-3, with severe right foraminal stenosis at L3-4. Electronically Signed   By: Jeannine Boga M.D.   On: 09/29/2016 22:52   Mr Lumbar Spine Wo Contrast  Result Date: 09/29/2016 CLINICAL DATA:  Initial evaluation for acute back pain, history of multiple recent falls. EXAM: MRI  THORACIC AND LUMBAR SPINE WITHOUT CONTRAST TECHNIQUE: Multiplanar and multiecho pulse sequences of the thoracic and lumbar spine, were obtained without intravenous contrast. COMPARISON:  Comparison made with prior CT from earlier the same day. S the E FINDINGS: MRI THORACIC SPINE FINDINGS Alignment: Accentuation of the normal thoracic kyphosis. Mild dextroscoliosis of the lower thoracic spine. Vertebrae: Abnormal T1 hypointense, stir hyperintense signal intensity extends through the inferior endplate of T1, compatible with acute compression fracture. Minimal height loss of up to 25%. No significant bony  retropulsion. There is an additional acute compression fracture involving the T11 vertebral body. Associated height loss of approximately 65%. Bony retropulsion measures 5 mm. Resultant mild spinal canal stenosis. Suspected associated fracture of the T11 spinous process which demonstrates marrow edema, with surrounding edema within the adjacent soft tissues (series 10, image 40). Vertebral body heights otherwise maintained. No other acute fracture identified. Minimal STIR signal intensity within the T5 vertebral body favored to be related to reactive endplate changes. Signal intensity within the vertebral body bone marrow otherwise within normal limits. Benign hemangioma noted within the T12 and L1 vertebral body. No worrisome osseous lesions. Cord: Signal intensity within the thoracic spinal cord is normal. No evidence for traumatic cord injury. Paraspinal and other soft tissues: Paraspinous soft tissues demonstrate no acute abnormality. Small layering left pleural effusion noted. Disc levels: Limited views of the cervical spine on counter sequence demonstrate reversal of the normal cervical lordosis with multilevel degenerative spondylolysis. Scattered multilevel facet hypertrophy noted throughout much of the upper and mid thoracic spine. Minimal disc bulge at T2-3 and T8-9. No other significant disc bulging through the T9-10 level. No focal disc protrusions. No canal or foraminal stenosis from the T1-2 level through the T9-10 level. T10-11: Diffuse disc bulge with disc desiccation and intervertebral disc space narrowing. Superimposed right paracentral disc protrusion indents and  flattens the ventral thecal sac with mild flattening of the right hemi cord (series 9, image 35). No cord signal changes. Superimposed posterior element hypertrophy. Mild canal and bilateral foraminal stenosis. T11-12: 5 mm bony retropulsion related to the T11 compression fracture. Retro falls ball lung indents the ventral thecal sac  results in mild canal stenosis (series 9, image 38). Superimposed mild facet hypertrophy. No significant foraminal encroachment. T12-L1: Diffuse degenerative disc bulge with disc desiccation. Superimposed left paracentral disc protrusion mildly flattens and indents the ventral thecal sac without significant canal stenosis (series 9, image 44). No significant foraminal narrowing. Mild edema about the left T11-12 facet at favored to be degenerative in nature. No definite fracture through the facet itself MRI LUMBAR SPINE FINDINGS Segmentation: Normal segmentation. Lowest well-formed disc is labeled the L5-S1 level. Alignment: 40 degrees of levoscoliosis with apex at L3 again seen, stable. Mild retrolisthesis of L1 on L2 and L2 on L3 also unchanged. Trace anterolisthesis of L5 on S1 also stable. Vertebrae: Chronic degenerative endplate Schmorl's node with associated height loss at the superior endplate of L3 again noted, stable. Since the prior study, there has been interval performance of vertebral augmentation at the L5 level. Vertebral body height at this level it is relatively stable. Known progressive fragmentation or bony retropulsion. Previously seen edema within the inferior endplate of L5 has largely resolved. Acute fractures involving the T10 and T11 vertebral bodies noted, described on the thoracic portion of this exam. Vertebral body heights otherwise maintained. No other acute fracture. Prominent benign hemangioma again noted at T12 and L1. No worrisome osseous lesions. Signal intensity within the vertebral body bone marrow otherwise within normal limits. Conus medullaris: Extends to the L1-2 level and appears normal. Paraspinal and other soft tissues: Paraspinous soft tissues demonstrate no acute abnormality. Fatty atrophy noted within the paraspinous musculature. Cholelithiasis noted. Visualized visceral structures otherwise grossly unremarkable. Disc levels: L1-2: Trace retrolisthesis. Diffuse  degenerative disc bulge with facet hypertrophy. No significant canal stenosis. Mild bilateral foraminal stenosis. L2-3: Trace retrolisthesis. Diffuse degenerative disc bulge with moderate bilateral facet arthrosis with ligamentum flavum hypertrophy. Moderate to severe canal stenosis and right lateral recess stenosis is stable. Fairly severe bilateral foraminal narrowing also unchanged, worse on the right. L3-4: Diffuse degenerative disc bulge with advanced bilateral facet arthrosis, right greater than left. Moderate canal stenosis, stable. Severe right with more mild left foraminal narrowing also stable. L4-5: Diffuse degenerative disc bulge with advanced facet arthrosis. Moderate canal stenosis, stable. Left greater than right lateral recess narrowing. Mild right with more moderate left foraminal stenosis. L5-S1: Degenerative disc desiccation with mild disc bulge. Severe facet arthrosis. 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 No significant canal stenosis. Severe left foraminal stenosis with probable compression of the left L5 nerve root again noted. More mild to moderate right foraminal stenosis. No complication identified status post recent vertebral augmentation. IMPRESSION: MRI THORACIC IMPRESSION: 1. Acute T11 compression fracture with 65% height loss and 5 mm bony retropulsion. Resultant mild canal stenosis. 2. Acute compression fracture through the inferior endplate of [id number] with mild 20% height loss without bony retropulsion. 3. No evidence for traumatic cord injury within the thoracic spine. 4. Degenerative disc bulge with right paracentral disc protrusion at T10-11 with resultant mild canal stenosis. 5. Small layering left pleural effusion. MRI LUMBAR IMPRESSION: 1. Sequelae of interval vertebral augmentation at L5 without complication. 2. No other acute traumatic injury within the lumbar spine. Stable chronic L3 fracture. 3. 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 4. Moderate to severe spinal stenosis at L2-3, with more moderate canal narrowing at L3-4 and L4-5. 5. Severe left foraminal stenosis at L5-S1 with compression of the L5 nerve root. 6. Severe bilateral foraminal stenosis at L2-3, with severe right foraminal stenosis at L3-4. Electronically Signed   By: Jeannine Boga M.D.   On: 09/29/2016 22:52   Dg Op Swallowing Func-medicare/speech Path  Result Date: 09/27/2016 Objective Swallowing Evaluation: Type of Study: MBS-Modified Barium Swallow Study Patient Details Name: SRIHITHA GODARD MRN: [id number] Date of Birth: 29-Sep-1941 Today's Date: 09/27/2016 Time: No Data Recorded-SLP Stop Time (ACUTE ONLY): 1310 (1340 time out) No Data Recorded Past Medical History: Past Medical History: Diagnosis Date . Allergy  . Anemia   hx of  . Anxiety  . Chronic insomnia 04/22/2012 . COPD (chronic obstructive pulmonary disease) ([place])  . Diverticulosis of colon  . GERD (gastroesophageal reflux disease)  . Glaucoma  . Hormone replacement therapy (postmenopausal)  . Hyperlipidemia  . Hypertension  . Low back pain  . Migraine headache   denies . Mild obesity  . Osteoarthritis  . Osteopenia  . Osteoporosis  . Pneumonia   hx of 5 years ago  . Scoliosis  . Shortness of breath   with exertion  . Spontaneous pneumothorax   1968 . Umbilical hernia [id number] Past Surgical History: Past Surgical History: Procedure Laterality Date . EXCISION/RELEASE BURSA HIP  04/26/2012  Procedure: EXCISION/RELEASE BURSA HIP;  Surgeon: Gearlean Alf, MD;  Location: [facility];  Service: Orthopedics;  Laterality: Right; . EYE SURGERY    - bil cataract surg and Retina surgery to left eye . HERNIA REPAIR  [id number]  umbilical hernia . HIP ADDUCTOR TENOTOMY   . IR GENERIC HISTORICAL  06/17/2016  IR RADIOLOGIST EVAL & MGMT 06/17/2016 [facility] . IR GENERIC HISTORICAL  07/01/2016  IR VERTEBROPLASTY LUMBAR BX INC UNI/BIL  INC/INJECT/IMAGING 07/01/2016 Luanne Bras, MD [facility] . IR GENERIC HISTORICAL  07/16/2016  IR RADIOLOGIST EVAL & MGMT 07/16/2016 [facility] . LUMBAR DISC SURGERY     x 2 . OTHER SURGICAL HISTORY    benign growth removed from right under ear at age 75  . SHOULDER ARTHROSCOPY    left shoulder . TONSILLECTOMY   . TONSILLECTOMY   . WRIST FRACTURE SURGERY    plate in left wrist/ fractured 2 times HPI: pt is a 75 yo female referred by Dr Tat for MBS.  PMH + for DM, migraines, COPD, colon AVM, anemia, oral thrush, C3-C4 cervical osteophytes, anxiety, COPD, HLD.  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 Pt reported to MD problems swallowing food and drink- more so with liquids over the last few years but worsening within the last year.     Subjective: pt awake in chair, family present Assessment / Plan / Recommendation CHL IP CLINICAL IMPRESSIONS 09/27/2016 Therapy Diagnosis Mild oral phase dysphagia;Mild pharyngeal phase dysphagia;Mild cervical esophageal phase dysphagia Clinical Impression Mild oropharyngeal and cervical esophageal dysphagia present. Decreased oral pressure results in premature spillage, mild oral residuals and piece-mealing.  Pharyngeal swallow largely strong with only minimal residuals. Pt did not aspirate or deeply penetrate with any bolus - minute penetration of thin/nectar to upper larynx cleared independently.  Pt did not demonstrate symptoms *coughing with intake* during MBS unfortunately.  She does appear with prominent cricopharyngeus and diverticulum trapping some barium but no backflow noted.  SLP  questions if this could be source of pt choking.  SLP testing chin tuck posture with pt to allow use if indicated in future - no deficits in swallow noted with posture therefore advised use as needed.  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  Advised pt to maintain strong cough/voice/expectoration abilities as much as able due to possible progressive neurological disease.  Thanks for this consult.     Impact on safety and function Mild aspiration risk   CHL IP TREATMENT RECOMMENDATION 05/17/2016 Treatment Recommendations No treatment recommended at this time   No flowsheet data found. CHL IP DIET RECOMMENDATION 09/27/2016 SLP Diet Recommendations Regular solids;Thin liquid Liquid Administration via Cup;Straw Medication Administration Whole meds with puree Compensations Slow rate;Small sips/bites;Other (Comment) Postural Changes Remain semi-upright after after feeds/meals (Comment);Seated upright at 90 degrees   CHL IP OTHER RECOMMENDATIONS 09/27/2016 Recommended Consults -- Oral Care Recommendations Oral care BID Other Recommendations --   CHL IP FOLLOW UP RECOMMENDATIONS 05/17/2016 Follow up Recommendations None   No flowsheet data found.     CHL IP ORAL PHASE 09/27/2016 Oral Phase Impaired Oral - Pudding Teaspoon -- Oral - Pudding Cup -- Oral - Honey Teaspoon -- Oral - Honey Cup -- Oral - Nectar Teaspoon -- Oral - Nectar Cup Weak lingual manipulation;Reduced posterior propulsion Oral - Nectar Straw -- Oral - Thin Teaspoon -- Oral - Thin Cup Weak lingual manipulation;Reduced posterior propulsion;Lingual/palatal residue Oral - Thin Straw Weak lingual manipulation;Reduced posterior propulsion Oral - Puree Weak lingual manipulation;Reduced posterior propulsion;Lingual/palatal residue Oral - Mech Soft NT Oral - Regular Weak lingual manipulation;Reduced posterior propulsion;Lingual/palatal residue Oral - Multi-Consistency -- Oral - Pill WFL Oral Phase - Comment piecemealing, pt does not sense oral residuals but can clear with dry swallows that are cued  CHL IP PHARYNGEAL PHASE 09/27/2016 Pharyngeal Phase Impaired Pharyngeal- Pudding Teaspoon -- Pharyngeal -- Pharyngeal- Pudding Cup -- Pharyngeal -- Pharyngeal- Honey Teaspoon -- Pharyngeal --  Pharyngeal- Honey Cup -- Pharyngeal -- Pharyngeal- Nectar Teaspoon -- Pharyngeal Material enters airway, remains ABOVE vocal cords then ejected out Pharyngeal- Nectar Cup [facility];Penetration/Aspiration during swallow Pharyngeal Material enters airway, remains ABOVE vocal cords then ejected out Pharyngeal- Nectar Straw -- Pharyngeal -- Pharyngeal- Thin Teaspoon -- Pharyngeal -- Pharyngeal- Thin Cup Penetration/Aspiration during swallow Pharyngeal Material enters airway, remains ABOVE vocal cords then ejected out Pharyngeal- Thin Straw Penetration/Aspiration during swallow Pharyngeal Material enters airway, remains ABOVE vocal cords then ejected out Pharyngeal- Puree WFL Pharyngeal -- Pharyngeal- Mechanical Soft -- Pharyngeal -- Pharyngeal- Regular WFL Pharyngeal -- Pharyngeal- Multi-consistency -- Pharyngeal -- Pharyngeal- Pill WFL Pharyngeal -- Pharyngeal Comment (No Data)  CHL IP CERVICAL ESOPHAGEAL PHASE 09/27/2016 Cervical Esophageal Phase Impaired Pudding Teaspoon -- Pudding Cup -- Honey Teaspoon -- Honey Cup -- Nectar Teaspoon -- Nectar Cup Reduced cricopharyngeal relaxation;Prominent cricopharyngeal segment Nectar Straw -- Thin Teaspoon -- Thin Cup Prominent cricopharyngeal segment Thin Straw Reduced cricopharyngeal relaxation;Prominent cricopharyngeal segment Puree Reduced cricopharyngeal relaxation;Prominent cricopharyngeal segment Mechanical Soft -- Regular Reduced cricopharyngeal relaxation;Prominent cricopharyngeal segment Multi-consistency -- Pill Reduced cricopharyngeal relaxation;Prominent cricopharyngeal segment Cervical Esophageal Comment barium tablet appeared to pause at proximal esophagus without pt awareness, clearing with further swallows, pt appears with 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 Initial encounter. EXAM: MODIFIED BARIUM SWALLOW TECHNIQUE: Different consistencies of barium were administered orally to the patient by the Speech Pathologist. Imaging of the pharynx was performed in the lateral projection. FLUOROSCOPY TIME:  Fluoroscopy Time:  1 minutes and 6 seconds Radiation Exposure Index (if provided by the fluoroscopic device): 2.2 mGy Number of Acquired Spot Images: 0 COMPARISON:  None. FINDINGS: Thin liquid- trace laryngeal penetration. Question tiny Zenker's diverticulum. Nectar thick liquid- trace laryngeal penetration. Pure- trace laryngeal penetration. Minimal pooling in the valleculae which clears with subsequent swallowing. Cracker-within normal limits Barium tablet - slightly delayed clearance from the cervical esophagus. IMPRESSION: Trace laryngeal penetration without aspiration. Question tiny Zenker's diverticulum. Slightly delayed clearance of ingested barium tablet. Please refer to the Speech Pathologists report for complete details and recommendations. Electronically Signed   By: Genia Del M.D.   On: 09/27/2016 14:16    Time Spent in minutes  25   Louellen Molder M.D on 10/04/2016 at 1:49 PM  Between 7am to 7pm - Pager - [phone]  After 7pm go to [email or web address] - password [facility]  [facility] -  Office  [phone]

## 2016-10-04 NOTE — Clinical Social Work Placement (Signed)
   CLINICAL SOCIAL WORK PLACEMENT  NOTE  Date:  10/04/2016  Patient Details  Name: Tara Neal MRN: [id number] Date of Birth: [birth year]  Clinical Social Work is seeking post-discharge placement for this patient at the [place] level of care (*CSW will initial, date and re-position this form in  chart as items are completed):  Yes   Patient/family provided with [place] Work Department's list of facilities offering this level of care within the geographic area requested by the patient (or if unable, by the patient's family).  Yes   Patient/family informed of their freedom to choose among providers that offer the needed level of care, that participate in Medicare, Medicaid or managed care program needed by the patient, have an available bed and are willing to accept the patient.  Yes   Patient/family informed of [facility]'s ownership interest in [facility] and [facility], as well as of the fact that they are under no obligation to receive care at these facilities.  PASRR submitted to EDS on 10/04/16     PASRR number received on       Existing PASRR number confirmed on 10/04/16     FL2 transmitted to all facilities in geographic area requested by pt/family on 10/04/16     FL2 transmitted to all facilities within larger geographic area on       Patient informed that his/her managed care company has contracts with or will negotiate with certain facilities, including the following:            Patient/family informed of bed offers received.  Patient chooses bed at       Physician recommends and patient chooses bed at      Patient to be transferred to   on  .  Patient to be transferred to facility by       Patient family notified on   of transfer.  Name of family member notified:        PHYSICIAN Please sign FL2     Additional Comment:    _______________________________________________ Glendon Axe A 10/04/2016, 10:45 AM

## 2016-10-04 NOTE — Clinical Social Work Note (Addendum)
Clinical Social Work Assessment  Patient Details  Name: Tara Neal MRN: [id number] Date of Birth: [birth year]  Date of referral:  10/04/16               Reason for consult:  Facility Placement, Discharge Planning                Permission sought to share information with:  Family Supports, Customer service manager, Case Optician, dispensing granted to share information::  Yes, Verbal Permission Granted  Name::      Sports administrator::    SNF's   Relationship::    Spouse   Contact Information:     Housing/Transportation Living arrangements for the past 2 months:  Single Family Home Source of Information:  Spouse Patient Interpreter Needed:  None Criminal Activity/Legal Involvement Pertinent to Current Situation/Hospitalization:  No - Comment as needed Significant Relationships:  Adult Children, Spouse Lives with:  Spouse Do you feel safe going back to the place where you live?  No Need for family participation in patient care:  Yes (Comment)  Care giving concerns:  Admitted from home with spouse.    Social Worker assessment / plan:  MSW met with patient's spouse, Tara Neal and son in waiting area to discuss potential post-acute placement within SNF. MSW explained OBS status and requirements for SNF under Medicare guidelines. MSW further explained that Medicare with NOT cover ST SNF placement for patient's under OBS and/or w/o 3 night qualifying stays. Patient's husband and son expressed understanding. MSW offered option for private pay at SNF. Pt's family reported that they are unable to pay privately and will prepare for patient to return home once medically stable for discharge.   Patient's husband shared that family has started [facility] application. MSW explained Medicaid process could take up to 45 days.   No further concerns reported at this time. MSW encouraged family to contact MSW as needed.    Employment status:  Retired Forensic scientist:  Medicare PT  Recommendations:  Home with [place] / Referral to community resources:  [place]  Patient/Family's Response to care:  Patient's husband and son very pleasant, polite and appreciated social work intervention. Family is supportive and strongly involved in patient's care planning.  Patient/Family's Understanding of and Emotional Response to Diagnosis, Current Treatment, and Prognosis:  Family also knowledgeable of diagnosis and current medical interventions.   Emotional Assessment Appearance:  Appears stated age Attitude/Demeanor/Rapport:  Unable to Assess Affect (typically observed):  Unable to Assess Orientation:  Oriented to Self, Oriented to Place, Oriented to  Time, Oriented to Situation Alcohol / Substance use:  Not Applicable Psych involvement (Current and /or in the community):  No (Comment)  Discharge Needs  Concerns to be addressed:  Care Coordination Readmission within the last 30 days:  No Current discharge risk:  Dependent with Mobility Barriers to Discharge:  Continued Medical Work up   Glendon Axe A 10/04/2016, 10:47 AM

## 2016-10-04 NOTE — Progress Notes (Signed)
   10/04/16 1500  Clinical Encounter Type  Visited With Patient and family together  Visit Type Initial  Referral From Other (Comment) (Nurse Tech)  Consult/Referral To Chaplain  Spiritual Encounters  Spiritual Needs Emotional  CHP had lengthy visit with patient and husband.  Provide presence and listening. Roe Coombs 10/04/16

## 2016-10-04 NOTE — NC FL2 (Signed)
[place] LEVEL OF CARE SCREENING TOOL     IDENTIFICATION  Patient Name: Tara Neal Birthdate: 23-Jun-1941 Sex: female Admission Date (Current Location): 09/29/2016  [facility] and [place] Number:  Herbalist and Address:  [facility],  [place] [address], [place]      Provider Number: [id number]  Attending Physician Name and Address:  Louellen Molder, MD  Relative Name and Phone Number:       Current Level of Care: Hospital Recommended Level of Care: [place] Prior Approval Number:    Date Approved/Denied:   PASRR Number:   [id number]  Discharge Plan: SNF    Current Diagnoses: Patient Active Problem List   Diagnosis Date Noted  . Dehydration 10/02/2016  . Thoracic compression fracture ([place]) 09/30/2016  . Acute diarrhea 09/30/2016  . Acute cystitis without hematuria   . Fall   . Urinary retention 07/08/2016  . UTI (lower urinary tract infection) 05/15/2016  . Bilateral hand pain 06/05/2015  . Gait disorder 04/15/2015  . Orthostatic hypotension 04/10/2015  . Recurrent falls while walking 04/10/2015  . Memory loss 04/10/2015  . Abnormal x-ray 02/20/2015  . Cough 11/18/2014  . Thrush, oral 04/04/2014  . Umbilical hernia 0000000  . Benign skin lesion 07/24/2013  . Anemia, unspecified 12/21/2012  . Chronic insomnia 04/22/2012  . Right hip pain 12/13/2011  . Right knee pain 12/13/2011  . Preventative health care 12/11/2011  . DIVERTICULOSIS, COLON 11/18/2010  . ARTERIOVENOUS MALFORMATION, COLON 11/18/2010  . ANXIETY 12/02/2009  . COPD (chronic obstructive pulmonary disease) ([place]) 05/27/2009  . Diabetes ([place]) 02/06/2009  . FATIGUE 12/12/2008  . Hyperlipidemia 10/01/2007  . OBESITY, MILD 10/01/2007  . MIGRAINE HEADACHE 10/01/2007  . Essential hypertension 10/01/2007  . 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 Pneumothorax 10/01/2007  . GERD 10/01/2007  . Osteoarthritis 10/01/2007  .  LOW BACK PAIN 10/01/2007  . OSTEOPOROSIS 10/01/2007    Orientation RESPIRATION BLADDER Height & Weight     Self, Time, Situation, Place  Normal Continent Weight: 130 lb (59 kg) Height:  5\' 4"  (162.6 cm)  BEHAVIORAL SYMPTOMS/MOOD NEUROLOGICAL BOWEL NUTRITION STATUS   (none )  (none ) Incontinent Diet (Regular)  AMBULATORY STATUS COMMUNICATION OF NEEDS Skin   Limited Assist Verbally Normal                       Personal Care Assistance Level of Assistance  Bathing, Feeding, Dressing Bathing Assistance: Limited assistance Feeding assistance: Independent Dressing Assistance: Limited assistance     Functional Limitations Info  Speech, Sight, Hearing Sight Info: Adequate Hearing Info: Adequate Speech Info: Adequate    SPECIAL CARE FACTORS FREQUENCY  PT (By licensed PT)     PT Frequency: 3              Contractures      Additional Factors Info  Code Status, Allergies Code Status Info: FULL CODE  Allergies Info: Codeine, Sulfonamide Derivatives, Penicillins, Percocet Oxycodone-acetaminophen           Current Medications (10/04/2016):  This is the current hospital active medication list Current Facility-Administered Medications  Medication Dose Route Frequency Provider Last Rate Last Dose  . acetaminophen (TYLENOL) tablet 650 mg  650 mg Oral Q6H PRN Gennaro Africa, MD   650 mg at 09/30/16 1721  . atenolol (TENORMIN) tablet 50 mg  50 mg Oral Daily Gennaro Africa, MD   50 mg at 10/04/16 0959  . benazepril (LOTENSIN) tablet 40  mg  40 mg Oral Daily Gennaro Africa, MD   40 mg at 10/04/16 1000  . calcium carbonate (OS-CAL - dosed in mg of elemental calcium) tablet 1,250 mg  1,250 mg Oral BID WC Gennaro Africa, MD   1,250 mg at 10/04/16 0759  . carbidopa-levodopa (SINEMET IR) 25-100 MG per tablet immediate release 1 tablet  1 tablet Oral TID Gennaro Africa, MD   1 tablet at 10/04/16 1000  . cholecalciferol (VITAMIN D) tablet 1,000 Units  1,000 Units Oral Daily Gennaro Africa, MD   1,000  Units at 10/04/16 1000  . ciprofloxacin (CIPRO) tablet 500 mg  500 mg Oral BID Adrian Saran, RPH   500 mg at 10/04/16 0759  . dextrose 5 %-0.45 % sodium chloride infusion   Intravenous Continuous Gennaro Africa, MD 75 mL/hr at 10/04/16 0544    . enoxaparin (LOVENOX) injection 40 mg  40 mg Subcutaneous Q24H Gennaro Africa, MD   40 mg at 10/04/16 1001  . feeding supplement (ENSURE ENLIVE) (ENSURE ENLIVE) liquid 237 mL  237 mL Oral BID BM Reyne Dumas, MD   237 mL at 10/04/16 1000  . folic acid (FOLVITE) tablet 1 mg  1 mg Oral Daily Gennaro Africa, MD   1 mg at 10/04/16 1000  . gabapentin (NEURONTIN) capsule 300 mg  300 mg Oral TID Gennaro Africa, MD   300 mg at 10/04/16 1000  . HYDROcodone-acetaminophen (NORCO/VICODIN) 5-325 MG per tablet 1 tablet  1 tablet Oral Q8H PRN Gennaro Africa, MD   1 tablet at 09/30/16 1514  . latanoprost (XALATAN) 0.005 % ophthalmic solution 1 drop  1 drop Both Eyes QHS Gennaro Africa, MD   1 drop at 10/03/16 2130  . loperamide (IMODIUM) capsule 2 mg  2 mg Oral PRN Gennaro Africa, MD   2 mg at 09/30/16 2238  . multivitamin with minerals tablet 1 tablet  1 tablet Oral Daily Gennaro Africa, MD   1 tablet at 10/04/16 1000  . pantoprazole (PROTONIX) EC tablet 40 mg  40 mg Oral Daily Gennaro Africa, MD   40 mg at 10/04/16 1000  . polyethylene glycol (MIRALAX / GLYCOLAX) packet 17 g  17 g Oral Daily PRN Gennaro Africa, MD      . simvastatin (ZOCOR) tablet 40 mg  40 mg Oral q1800 Gennaro Africa, MD   40 mg at 10/03/16 1735  . timolol (TIMOPTIC) 0.5 % ophthalmic solution 1 drop  1 drop Both Eyes BID Gennaro Africa, MD   1 drop at 10/04/16 1001  . venlafaxine XR (EFFEXOR-XR) 24 hr capsule 150 mg  150 mg Oral Q breakfast Gennaro Africa, MD   150 mg at 10/04/16 0759  . vitamin C (ASCORBIC ACID) tablet 1,000 mg  1,000 mg Oral Daily Gennaro Africa, MD   1,000 mg at 10/04/16 1000     Discharge Medications: Please see discharge summary for a list of discharge medications.  Relevant Imaging Results:  Relevant Lab  Results:   Additional Information SSN [id number]  Glendon Axe A

## 2016-10-04 NOTE — Progress Notes (Addendum)
Physical Therapy Treatment Patient Details Name: Tara Neal MRN: [id number] DOB: 1941-08-31 Today's Date: 10/04/2016    History of Present Illness 75 y.o. female admitted with diarrhea, frequent falls. Dx of acute T10, T11 compression fractures with canal stenosis. PMH of HTN, OA, back surgeries.     PT Comments    Patient was seen in bed upon arrival with family present. Performed supine to sit x min guard requiring increased time. Required VC's to scoot to EOB. Performed sit to stand x minA/modA with Vc's for safe hand placement and to correct posterior lean. Gait x 50 ft x minA with VC's to correct toe walking and to ambulate with heel-toe. Patient displayed a masked like expression/confusion toward end of ambulation. Patient is limited by decreased activity tolerance, decreased endurance, and decreased safety awareness during ambulation.  Follow Up Recommendations  SNF per chart review and family concerns Will update LPT      Equipment Recommendations  None recommended by PT    Recommendations for Other Services       Precautions / Restrictions Precautions Precautions: Fall Precaution Comments: h/o falls Restrictions Weight Bearing Restrictions: No    Mobility  Bed Mobility Overal bed mobility: Needs Assistance Bed Mobility: Supine to Sit     Supine to sit: Min guard     General bed mobility comments: pt. requires increased time to perform supine to sit and requires VC's to scoot to EOB.   Transfers Overall transfer level: Needs assistance Equipment used: Rolling walker (2 wheeled) Transfers: Sit to/from Stand Sit to Stand: Min assist         General transfer comment: VC's required for safe hand placement. Displayed posterior lean initially.   Ambulation/Gait Ambulation/Gait assistance: Min assist;Min guard Ambulation Distance (Feet): 50 Feet Assistive device: Rolling walker (2 wheeled) Gait Pattern/deviations: Step-through pattern;Narrow base of  support;Scissoring Gait velocity: decreased Gait velocity interpretation: Below normal speed for age/gender General Gait Details: displayed initial scissoring gait, required VC's to widen BOS. Gait improved as distance increased.  VC's to ambulate heel to toe; pt. was walking on toes.    Stairs            Wheelchair Mobility    Modified Rankin (Stroke Patients Only)       Balance                                    Cognition Arousal/Alertness: Awake/alert Behavior During Therapy: WFL for tasks assessed/performed Overall Cognitive Status: Within Functional Limits for tasks assessed                      Exercises      General Comments        Pertinent Vitals/Pain Pain Assessment: 0-10 Pain Score: 5  Pain Descriptors / Indicators: Discomfort;Aching Pain Intervention(s): Limited activity within patient's tolerance;Monitored during session;Repositioned    Home Living                      Prior Function            PT Goals (current goals can now be found in the care plan section) Progress towards PT goals: Progressing toward goals    Frequency    Min 3X/week      PT Plan Current plan remains appropriate    Co-evaluation             End of Session  Equipment Utilized During Treatment: Gait belt Activity Tolerance: Patient limited by fatigue Patient left: with call bell/phone within reach;with bed alarm set;with family/visitor present;in chair     Time: [id number] PT Time Calculation (min) (ACUTE ONLY): 16 min  Charges:  $Gait Training: 8-22 mins                    G Codes:      Hall Busing, SPTA [facility] [phone]  Present and agree with above  Rica Koyanagi  PTA [facility]  Acute  Rehab Pager      [phone]

## 2016-10-05 DIAGNOSIS — I10 Essential (primary) hypertension: Secondary | ICD-10-CM | POA: Diagnosis not present

## 2016-10-05 DIAGNOSIS — J449 Chronic obstructive pulmonary disease, unspecified: Secondary | ICD-10-CM | POA: Diagnosis not present

## 2016-10-05 DIAGNOSIS — M810 Age-related osteoporosis without current pathological fracture: Secondary | ICD-10-CM | POA: Diagnosis not present

## 2016-10-05 DIAGNOSIS — Z79899 Other long term (current) drug therapy: Secondary | ICD-10-CM | POA: Diagnosis not present

## 2016-10-05 DIAGNOSIS — W19XXXD Unspecified fall, subsequent encounter: Secondary | ICD-10-CM | POA: Diagnosis not present

## 2016-10-05 DIAGNOSIS — R1310 Dysphagia, unspecified: Secondary | ICD-10-CM | POA: Diagnosis not present

## 2016-10-05 DIAGNOSIS — M545 Low back pain: Secondary | ICD-10-CM | POA: Diagnosis not present

## 2016-10-05 DIAGNOSIS — G609 Hereditary and idiopathic neuropathy, unspecified: Secondary | ICD-10-CM | POA: Diagnosis not present

## 2016-10-05 DIAGNOSIS — E669 Obesity, unspecified: Secondary | ICD-10-CM | POA: Diagnosis not present

## 2016-10-05 DIAGNOSIS — S22088A Other fracture of T11-T12 vertebra, initial encounter for closed fracture: Secondary | ICD-10-CM | POA: Diagnosis not present

## 2016-10-05 DIAGNOSIS — M0689 Other specified rheumatoid arthritis, multiple sites: Secondary | ICD-10-CM | POA: Diagnosis not present

## 2016-10-05 DIAGNOSIS — M546 Pain in thoracic spine: Secondary | ICD-10-CM | POA: Diagnosis not present

## 2016-10-05 DIAGNOSIS — M1990 Unspecified osteoarthritis, unspecified site: Secondary | ICD-10-CM | POA: Diagnosis not present

## 2016-10-05 DIAGNOSIS — M5124 Other intervertebral disc displacement, thoracic region: Secondary | ICD-10-CM | POA: Diagnosis not present

## 2016-10-05 DIAGNOSIS — M6281 Muscle weakness (generalized): Secondary | ICD-10-CM | POA: Diagnosis not present

## 2016-10-05 DIAGNOSIS — R2689 Other abnormalities of gait and mobility: Secondary | ICD-10-CM | POA: Diagnosis not present

## 2016-10-05 DIAGNOSIS — E440 Moderate protein-calorie malnutrition: Secondary | ICD-10-CM | POA: Diagnosis not present

## 2016-10-05 DIAGNOSIS — R197 Diarrhea, unspecified: Secondary | ICD-10-CM | POA: Diagnosis not present

## 2016-10-05 DIAGNOSIS — Z9181 History of falling: Secondary | ICD-10-CM | POA: Diagnosis not present

## 2016-10-05 DIAGNOSIS — B961 Klebsiella pneumoniae [K. pneumoniae] as the cause of diseases classified elsewhere: Secondary | ICD-10-CM | POA: Diagnosis not present

## 2016-10-05 DIAGNOSIS — D472 Monoclonal gammopathy: Secondary | ICD-10-CM | POA: Diagnosis not present

## 2016-10-05 DIAGNOSIS — N390 Urinary tract infection, site not specified: Secondary | ICD-10-CM | POA: Diagnosis not present

## 2016-10-05 DIAGNOSIS — E538 Deficiency of other specified B group vitamins: Secondary | ICD-10-CM | POA: Diagnosis not present

## 2016-10-05 DIAGNOSIS — K219 Gastro-esophageal reflux disease without esophagitis: Secondary | ICD-10-CM | POA: Diagnosis not present

## 2016-10-05 DIAGNOSIS — N3000 Acute cystitis without hematuria: Secondary | ICD-10-CM | POA: Diagnosis not present

## 2016-10-05 DIAGNOSIS — Z87891 Personal history of nicotine dependence: Secondary | ICD-10-CM | POA: Diagnosis not present

## 2016-10-05 DIAGNOSIS — W19XXXA Unspecified fall, initial encounter: Secondary | ICD-10-CM | POA: Diagnosis not present

## 2016-10-05 DIAGNOSIS — E860 Dehydration: Secondary | ICD-10-CM

## 2016-10-05 DIAGNOSIS — K5909 Other constipation: Secondary | ICD-10-CM | POA: Diagnosis not present

## 2016-10-05 DIAGNOSIS — E785 Hyperlipidemia, unspecified: Secondary | ICD-10-CM | POA: Diagnosis not present

## 2016-10-05 DIAGNOSIS — G231 Progressive supranuclear ophthalmoplegia [Steele-Richardson-Olszewski]: Secondary | ICD-10-CM | POA: Diagnosis not present

## 2016-10-05 DIAGNOSIS — S22080G Wedge compression fracture of T11-T12 vertebra, subsequent encounter for fracture with delayed healing: Secondary | ICD-10-CM | POA: Diagnosis not present

## 2016-10-05 DIAGNOSIS — J939 Pneumothorax, unspecified: Secondary | ICD-10-CM | POA: Diagnosis not present

## 2016-10-05 DIAGNOSIS — S22080D Wedge compression fracture of T11-T12 vertebra, subsequent encounter for fracture with routine healing: Secondary | ICD-10-CM | POA: Diagnosis not present

## 2016-10-05 DIAGNOSIS — F418 Other specified anxiety disorders: Secondary | ICD-10-CM | POA: Diagnosis not present

## 2016-10-05 DIAGNOSIS — S22089A Unspecified fracture of T11-T12 vertebra, initial encounter for closed fracture: Secondary | ICD-10-CM | POA: Diagnosis present

## 2016-10-05 DIAGNOSIS — M4854XA Collapsed vertebra, not elsewhere classified, thoracic region, initial encounter for fracture: Secondary | ICD-10-CM | POA: Diagnosis not present

## 2016-10-05 DIAGNOSIS — S22078A Other fracture of T9-T10 vertebra, initial encounter for closed fracture: Secondary | ICD-10-CM | POA: Diagnosis not present

## 2016-10-05 DIAGNOSIS — R278 Other lack of coordination: Secondary | ICD-10-CM | POA: Diagnosis not present

## 2016-10-05 DIAGNOSIS — Z6820 Body mass index (BMI) 20.0-20.9, adult: Secondary | ICD-10-CM | POA: Diagnosis not present

## 2016-10-05 MED ORDER — CIPROFLOXACIN HCL 500 MG PO TABS
500.0000 mg | ORAL_TABLET | Freq: Two times a day (BID) | ORAL | 0 refills | Status: DC
Start: 2016-10-05 — End: 2016-10-05

## 2016-10-05 MED ORDER — CIPROFLOXACIN HCL 500 MG PO TABS: 500.0000 mg | ORAL_TABLET | Freq: Two times a day (BID) | ORAL | 0 refills | Status: AC

## 2016-10-05 MED ORDER — ENSURE ENLIVE PO LIQD: 237.0000 mL | Freq: Two times a day (BID) | ORAL | 12 refills | Status: DC

## 2016-10-05 NOTE — Discharge Summary (Signed)
Physician Discharge Summary  Tara Neal [id number] DOB: June 26, 1941 DOA: 09/29/2016  PCP: [name], MD  Admit date: 09/29/2016 Discharge date: 10/05/2016  Admitted From: Home Disposition: Skilled nursing facility [facility])  Recommendations for Outpatient Follow-up:  1. Follow up with M.D. at SNF in 1 week 2. Patient will be called by IR to schedule outpatient follow-up for kyphoplasty/vertebroplasty. 3. Completes 5 day course of ciprofloxacin today.( 2 more dose)    Equipment/Devices: Per therapy at SNF.  Discharge Condition: Stable CODE STATUS: Full code Diet recommendation: Regular with supplement    Discharge Diagnoses:   Principal problem Klebsiella UTI  Active Problems:   Enteropathic Escherichia coli diarrhea   COPD (chronic obstructive pulmonary disease) (HCC)   Thoracic compression fracture (HCC)   Acute cystitis without hematuria   Fall   Dehydration   Moderate protein calorie malnutrition.  Brief narrative/ HPI 75 year old female with chronic back pain, cervical spinal stenosis and foraminal stenosis, lumbar fracture with lumbar spinal stenosis, carpal tunnel syndrome, osteopenia and chronic diarrhea presented to the ED with worsening back pain with weakness and worsening diarrhea. Patient seen by IR with plan on outpatient kyphoplasty. Stool for C. difficile was negative but showed Enterobacter and Escherichia coli.  Hospital course  Principal Problem:   Acute Thoracic compression fracture (HCC) T11 with mild canal stenosis / compression fracture; T10 inferior endplate fracture . Also has severe degenerative joint disease, moderate to severe lumbar spinal stenosis. No cord involvement. IR consult appreciated. Vertebroplasty versus kyphoplasty as outpatient.(They will call to schedule appointment) Pain control with prn vicodin.   Unsteady gait - Likely in the setting of compression fracture . PT recommends SNF.  Enteropathogenic Escherichia coli  enteritis / Dehydration  C. difficile negative. Treated with ciprofloxacin for UTI which should cover. Diarrhea has resolved.  Essential hypertension - Continue Lotensin and atenolol  Klebsiella UTI / leukocytosis - Completes 5 day course of oral ciprofloxacin today.   Dyslipidemia - Continue simvastatin .  Hypokalemia Secondary to diarrhea. Replenished.  moderate protein calorie malnutrition added supplement    Family Communication  : husband at bedside  Disposition Plan  : SNF  Consults  :  IR  Procedures  : MRI of the lumbar spine   Discharge Instructions     Medication List    STOP taking these medications   alendronate 70 MG tablet Commonly known as:  FOSAMAX   furosemide 40 MG tablet Commonly known as:  LASIX   polyethylene glycol packet Commonly known as:  MIRALAX / GLYCOLAX     TAKE these medications   acetaminophen 325 MG tablet Commonly known as:  TYLENOL Take 650 mg by mouth at bedtime.   atenolol 50 MG tablet Commonly known as:  TENORMIN TAKE 1 TABLET EVERY DAY What changed:  See the new instructions.   benazepril 40 MG tablet Commonly known as:  LOTENSIN TAKE 1 TABLET EVERY DAY What changed:  See the new instructions.   calcium carbonate 600 MG Tabs tablet Commonly known as:  OS-CAL Take 600 mg by mouth 2 (two) times daily with a meal.   carbidopa-levodopa 25-100 MG tablet Commonly known as:  SINEMET IR Take 1 tablet by mouth 3 (three) times daily.  Ciprofloxacin 500 mg PO tablet Take 1 tablet 2 times a day (total 2 doses)   feeding supplement (ENSURE ENLIVE) Liqd Take 237 mLs by mouth 2 (two) times daily between meals.   Fish Oil 1200 MG Caps Take 2 capsules by mouth daily.   folic acid 1  MG tablet Commonly known as:  FOLVITE Take 1 mg by mouth daily.   gabapentin 300 MG capsule Commonly known as:  NEURONTIN Take 300 mg by mouth 3 (three) times daily.   HYDROcodone-acetaminophen 5-325 MG tablet Commonly  known as:  NORCO/VICODIN Take 1 tablet by mouth every 8 (eight) hours as needed for moderate pain.   latanoprost 0.005 % ophthalmic solution Commonly known as:  XALATAN Place 1 drop into both eyes at bedtime.   Melatonin 3 MG Tabs Take 6 tablets by mouth at bedtime.   meloxicam 7.5 MG tablet Commonly known as:  MOBIC Take 15 mg by mouth daily.   methotrexate 250 MG/10ML injection Inject 1 mL (25 mg total) into the skin once a week.   multivitamin tablet Take 1 tablet by mouth daily.   omeprazole 20 MG capsule Commonly known as:  PRILOSEC TAKE 2 CAPSULES EVERY DAY What changed:  See the new instructions.   simvastatin 40 MG tablet Commonly known as:  ZOCOR TAKE 1 TABLET EVERY DAY  AT  6PM What changed:  See the new instructions.   timolol 0.5 % ophthalmic solution Commonly known as:  TIMOPTIC Place 1 drop into both eyes 2 (two) times daily.   venlafaxine XR 150 MG 24 hr capsule Commonly known as:  EFFEXOR-XR TAKE 1 CAPSULE EVERY DAY WITH BREAKFAST What changed:  See the new instructions.   vitamin C 1000 MG tablet Take 1,000 mg by mouth daily.   Vitamin D3 1000 units Caps Take 1 capsule by mouth daily.       Contact information for follow-up providers    DEVESHWAR, Fritz Pickerel, MD. Schedule an appointment as soon as possible for a visit in 1 week(s).   Specialty:  Interventional Radiology Contact information: [address] [place] [address] [phone]            Contact information for after-discharge care    Destination    [facility] Follow up.   Specialty:  Skilled Nursing Facility Contact information: [place] [phone]                 Allergies  Allergen Reactions  . Codeine Nausea Only    Can take low doses  . Sulfonamide Derivatives Nausea Only    REACTION: Nausea  . Penicillins Rash    .Marland KitchenHas patient had a PCN reaction causing immediate rash,  facial/tongue/throat swelling, SOB or lightheadedness with hypotension: No Has patient had a PCN reaction causing severe rash involving mucus membranes or skin necrosis: No Has patient had a PCN reaction that required hospitalization No Has patient had a PCN reaction occurring within the last 10 years: No If all of the above answers are "NO", then may proceed with Cephalosporin use.   Marland Kitchen Percocet [Oxycodone-Acetaminophen] Itching      Procedures/Studies: Dg Chest 2 View  Result Date: 09/29/2016 CLINICAL DATA:  Cough for 2-3 days EXAM: CHEST  2 VIEW COMPARISON:  05/04/2016 FINDINGS: The There is no focal parenchymal opacity. There is no pleural effusion or pneumothorax. The heart and mediastinal contours are unremarkable. There is a T11 vertebral body compression fracture new compared with 05/15/2016. IMPRESSION: No active cardiopulmonary disease. Age indeterminate T11 vertebral body compression fracture new compared with 05/15/2016. Electronically Signed   By: Kathreen Devoid   On: 09/29/2016 13:35   Ct Thoracic Spine Wo Contrast  Result Date: 09/29/2016 CLINICAL DATA:  Abnormal chest x-ray with T11 compression fracture. Back  pain. Multiple recent falls. History of L5 compression fracture status post augmentation. EXAM: CT THORACIC SPINE WITHOUT CONTRAST TECHNIQUE: Multidetector CT imaging of the thoracic spine was performed without intravenous contrast administration. Multiplanar CT image reconstructions were also generated. COMPARISON:  Chest radiographs 09/29/2016. Lumbar spine MRI 06/02/2016. Lumbar spine CT 05/15/2016. FINDINGS: Alignment: Mild lower thoracic dextroscoliosis. Trace anterolisthesis of C7 on T1. Vertebrae: As described on earlier chest radiographs, there is a T11 superior endplate compression fracture with approximately 65% height loss. This is new from the prior MRI and is felt to be recent as the fracture line is still visible. Retropulsion of the posterior T11 vertebral body  cortex measures 5 mm and results in mild spinal stenosis. There is also a new T10 inferior endplate fracture with mild focal depression anteriorly but no significant generalized vertebral body height loss or retropulsion. The bones appear osteopenic. Paraspinal and other soft tissues: Scarring in the lung apices with mild bronchiectasis in the right upper lobe. Mild centrilobular emphysema. Aortic atherosclerosis. Disc levels: Mild disc degeneration in the lower thoracic spine with degenerative endplate sclerosis. There is a small calcified right paracentral disc protrusion at T10-11 which results in mild right-sided spinal stenosis, present on the prior MRI though better visualized on the current examination. Thoracic facet arthrosis is most advanced at T12-L1 on the left. IMPRESSION: 1. Likely recent T11 vertebral fracture with 65% height loss and 5 mm retropulsion resulting in mild spinal stenosis. 2. T10 inferior endplate fracture without significant height loss. 3. Small right paracentral disc protrusion at T10-11 with mild spinal canal narrowing. Electronically Signed   By: Logan Bores M.D.   On: 09/29/2016 16:00   Mr Thoracic Spine Wo Contrast  Result Date: 09/29/2016 CLINICAL DATA:  Initial evaluation for acute back pain, history of multiple recent falls. EXAM: MRI  THORACIC AND LUMBAR SPINE WITHOUT CONTRAST TECHNIQUE: Multiplanar and multiecho pulse sequences of the thoracic and lumbar spine, were obtained without intravenous contrast. COMPARISON:  Comparison made with prior CT from earlier the same day. S the E FINDINGS: MRI THORACIC SPINE FINDINGS Alignment: Accentuation of the normal thoracic kyphosis. Mild dextroscoliosis of the lower thoracic spine. Vertebrae: Abnormal T1 hypointense, stir hyperintense signal intensity extends through the inferior endplate of T1, compatible with acute compression fracture. Minimal height loss of up to 25%. No significant bony retropulsion. There is an additional  acute compression fracture involving the T11 vertebral body. Associated height loss of approximately 65%. Bony retropulsion measures 5 mm. Resultant mild spinal canal stenosis. Suspected associated fracture of the T11 spinous process which demonstrates marrow edema, with surrounding edema within the adjacent soft tissues (series 10, image 40). Vertebral body heights otherwise maintained. No other acute fracture identified. Minimal STIR signal intensity within the T5 vertebral body favored to be related to reactive endplate changes. Signal intensity within the vertebral body bone marrow otherwise within normal limits. Benign hemangioma noted within the T12 and L1 vertebral body. No worrisome osseous lesions. Cord: Signal intensity within the thoracic spinal cord is normal. No evidence for traumatic cord injury. Paraspinal and other soft tissues: Paraspinous soft tissues demonstrate no acute abnormality. Small layering left pleural effusion noted. Disc levels: Limited views of the cervical spine on counter sequence demonstrate reversal of the normal cervical lordosis with multilevel degenerative spondylolysis. Scattered multilevel facet hypertrophy noted throughout much of the upper and mid thoracic spine. Minimal disc bulge at T2-3 and T8-9. No other significant disc bulging through the T9-10 level. No focal disc protrusions. No canal or foraminal  stenosis from the T1-2 level through the T9-10 level. T10-11: Diffuse disc bulge with disc desiccation and intervertebral disc space narrowing. Superimposed right paracentral disc protrusion indents and flattens the ventral thecal sac with mild flattening of the right hemi cord (series 9, image 35). No cord signal changes. Superimposed posterior element hypertrophy. Mild canal and bilateral foraminal stenosis. T11-12: 5 mm bony retropulsion related to the T11 compression fracture. Retro falls ball lung indents the ventral thecal sac results in mild canal stenosis (series 9,  image 38). Superimposed mild facet hypertrophy. No significant foraminal encroachment. T12-L1: Diffuse degenerative disc bulge with disc desiccation. Superimposed left paracentral disc protrusion mildly flattens and indents the ventral thecal sac without significant canal stenosis (series 9, image 44). No significant foraminal narrowing. Mild edema about the left T11-12 facet at favored to be degenerative in nature. No definite fracture through the facet itself MRI LUMBAR SPINE FINDINGS Segmentation: Normal segmentation. Lowest well-formed disc is labeled the L5-S1 level. Alignment: 40 degrees of levoscoliosis with apex at L3 again seen, stable. Mild retrolisthesis of L1 on L2 and L2 on L3 also unchanged. Trace anterolisthesis of L5 on S1 also stable. Vertebrae: Chronic degenerative endplate Schmorl's node with associated height loss at the superior endplate of L3 again noted, stable. Since the prior study, there has been interval performance of vertebral augmentation at the L5 level. Vertebral body height at this level it is relatively stable. Known progressive fragmentation or bony retropulsion. Previously seen edema within the inferior endplate of L5 has largely resolved. Acute fractures involving the T10 and T11 vertebral bodies noted, described on the thoracic portion of this exam. Vertebral body heights otherwise maintained. No other acute fracture. Prominent benign hemangioma again noted at T12 and L1. No worrisome osseous lesions. Signal intensity within the vertebral body bone marrow otherwise within normal limits. Conus medullaris: Extends to the L1-2 level and appears normal. Paraspinal and other soft tissues: Paraspinous soft tissues demonstrate no acute abnormality. Fatty atrophy noted within the paraspinous musculature. Cholelithiasis noted. Visualized visceral structures otherwise grossly unremarkable. Disc levels: L1-2: Trace retrolisthesis. Diffuse degenerative disc bulge with facet hypertrophy. No  significant canal stenosis. Mild bilateral foraminal stenosis. L2-3: Trace retrolisthesis. Diffuse degenerative disc bulge with moderate bilateral facet arthrosis with ligamentum flavum hypertrophy. Moderate to severe canal stenosis and right lateral recess stenosis is stable. Fairly severe bilateral foraminal narrowing also unchanged, worse on the right. L3-4: Diffuse degenerative disc bulge with advanced bilateral facet arthrosis, right greater than left. Moderate canal stenosis, stable. Severe right with more mild left foraminal narrowing also stable. L4-5: Diffuse degenerative disc bulge with advanced facet arthrosis. Moderate canal stenosis, stable. Left greater than right lateral recess narrowing. Mild right with more moderate left foraminal stenosis. L5-S1: Degenerative disc desiccation with mild disc bulge. Severe facet arthrosis. 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 No significant canal stenosis. Severe left foraminal stenosis with probable compression of the left L5 nerve root again noted. More mild to moderate right foraminal stenosis. No complication identified status post recent vertebral augmentation. IMPRESSION: MRI THORACIC IMPRESSION: 1. Acute T11 compression fracture with 65% height loss and 5 mm bony retropulsion. Resultant mild canal stenosis. 2. Acute compression fracture through the inferior endplate of [id number] with mild 20% height loss without bony retropulsion. 3. No evidence for traumatic cord injury within the thoracic spine. 4. Degenerative disc bulge with right paracentral disc protrusion at T10-11 with resultant mild canal stenosis. 5. Small layering left pleural effusion. MRI LUMBAR IMPRESSION: 1. Sequelae of  interval vertebral augmentation at L5 without complication. 2. No other acute traumatic injury within the lumbar spine. Stable chronic L3 fracture. 3. 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 4. Moderate to severe spinal stenosis at L2-3, with more moderate canal narrowing at L3-4 and L4-5. 5. Severe left foraminal stenosis at L5-S1 with compression of the L5 nerve root. 6. Severe bilateral foraminal stenosis at L2-3, with severe right foraminal stenosis at L3-4. Electronically Signed   By: Jeannine Boga M.D.   On: 09/29/2016 22:52   Mr Lumbar Spine Wo Contrast  Result Date: 09/29/2016 CLINICAL DATA:  Initial evaluation for acute back pain, history of multiple recent falls. EXAM: MRI  THORACIC AND LUMBAR SPINE WITHOUT CONTRAST TECHNIQUE: Multiplanar and multiecho pulse sequences of the thoracic and lumbar spine, were obtained without intravenous contrast. COMPARISON:  Comparison made with prior CT from earlier the same day. S the E FINDINGS: MRI THORACIC SPINE FINDINGS Alignment: Accentuation of the normal thoracic kyphosis. Mild dextroscoliosis of the lower thoracic spine. Vertebrae: Abnormal T1 hypointense, stir hyperintense signal intensity extends through the inferior endplate of T1, compatible with acute compression fracture. Minimal height loss of up to 25%. No significant bony retropulsion. There is an additional acute compression fracture involving the T11 vertebral body. Associated height loss of approximately 65%. Bony retropulsion measures 5 mm. Resultant mild spinal canal stenosis. Suspected associated fracture of the T11 spinous process which demonstrates marrow edema, with surrounding edema within the adjacent soft tissues (series 10, image 40). Vertebral body heights otherwise maintained. No other acute fracture identified. Minimal STIR signal intensity within the T5 vertebral body favored to be related to reactive endplate changes. Signal intensity within the vertebral body bone marrow otherwise within normal limits. Benign hemangioma noted within the T12 and L1 vertebral body. No worrisome osseous lesions. Cord: Signal intensity  within the thoracic spinal cord is normal. No evidence for traumatic cord injury. Paraspinal and other soft tissues: Paraspinous soft tissues demonstrate no acute abnormality. Small layering left pleural effusion noted. Disc levels: Limited views of the cervical spine on counter sequence demonstrate reversal of the normal cervical lordosis with multilevel degenerative spondylolysis. Scattered multilevel facet hypertrophy noted throughout much of the upper and mid thoracic spine. Minimal disc bulge at T2-3 and T8-9. No other significant disc bulging through the T9-10 level. No focal disc protrusions. No canal or foraminal stenosis from the T1-2 level through the T9-10 level. T10-11: Diffuse disc bulge with disc desiccation and intervertebral disc space narrowing. Superimposed right paracentral disc protrusion indents and flattens the ventral thecal sac with mild flattening of the right hemi cord (series 9, image 35). No cord signal changes. Superimposed posterior element hypertrophy. Mild canal and bilateral foraminal stenosis. T11-12: 5 mm bony retropulsion related to the T11 compression fracture. Retro falls ball lung indents the ventral thecal sac results in mild canal stenosis (series 9, image 38). Superimposed mild facet hypertrophy. No significant foraminal encroachment. T12-L1: Diffuse degenerative disc bulge with disc desiccation. Superimposed left paracentral disc protrusion mildly flattens and indents the ventral thecal sac without significant canal stenosis (series 9, image 44). No significant foraminal narrowing. Mild edema about the left T11-12 facet at favored to be degenerative in nature. No definite fracture through the facet itself MRI LUMBAR SPINE FINDINGS Segmentation: Normal segmentation. Lowest well-formed disc is labeled the L5-S1 level. Alignment: 40 degrees of levoscoliosis with apex at L3 again seen, stable. Mild retrolisthesis of L1 on L2 and  L2 on L3 also unchanged. Trace anterolisthesis of  L5 on S1 also stable. Vertebrae: Chronic degenerative endplate Schmorl's node with associated height loss at the superior endplate of L3 again noted, stable. Since the prior study, there has been interval performance of vertebral augmentation at the L5 level. Vertebral body height at this level it is relatively stable. Known progressive fragmentation or bony retropulsion. Previously seen edema within the inferior endplate of L5 has largely resolved. Acute fractures involving the T10 and T11 vertebral bodies noted, described on the thoracic portion of this exam. Vertebral body heights otherwise maintained. No other acute fracture. Prominent benign hemangioma again noted at T12 and L1. No worrisome osseous lesions. Signal intensity within the vertebral body bone marrow otherwise within normal limits. Conus medullaris: Extends to the L1-2 level and appears normal. Paraspinal and other soft tissues: Paraspinous soft tissues demonstrate no acute abnormality. Fatty atrophy noted within the paraspinous musculature. Cholelithiasis noted. Visualized visceral structures otherwise grossly unremarkable. Disc levels: L1-2: Trace retrolisthesis. Diffuse degenerative disc bulge with facet hypertrophy. No significant canal stenosis. Mild bilateral foraminal stenosis. L2-3: Trace retrolisthesis. Diffuse degenerative disc bulge with moderate bilateral facet arthrosis with ligamentum flavum hypertrophy. Moderate to severe canal stenosis and right lateral recess stenosis is stable. Fairly severe bilateral foraminal narrowing also unchanged, worse on the right. L3-4: Diffuse degenerative disc bulge with advanced bilateral facet arthrosis, right greater than left. Moderate canal stenosis, stable. Severe right with more mild left foraminal narrowing also stable. L4-5: Diffuse degenerative disc bulge with advanced facet arthrosis. Moderate canal stenosis, stable. Left greater than right lateral recess narrowing. Mild right with more  moderate left foraminal stenosis. L5-S1: Degenerative disc desiccation with mild disc bulge. Severe facet arthrosis. 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 No significant canal stenosis. Severe left foraminal stenosis with probable compression of the left L5 nerve root again noted. More mild to moderate right foraminal stenosis. No complication identified status post recent vertebral augmentation. IMPRESSION: MRI THORACIC IMPRESSION: 1. Acute T11 compression fracture with 65% height loss and 5 mm bony retropulsion. Resultant mild canal stenosis. 2. Acute compression fracture through the inferior endplate of [id number] with mild 20% height loss without bony retropulsion. 3. No evidence for traumatic cord injury within the thoracic spine. 4. Degenerative disc bulge with right paracentral disc protrusion at T10-11 with resultant mild canal stenosis. 5. Small layering left pleural effusion. MRI LUMBAR IMPRESSION: 1. Sequelae of interval vertebral augmentation at L5 without complication. 2. No other acute traumatic injury within the lumbar spine. Stable chronic L3 fracture. 3. 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 4. Moderate to severe spinal stenosis at L2-3, with more moderate canal narrowing at L3-4 and L4-5. 5. Severe left foraminal stenosis at L5-S1 with compression of the L5 nerve root. 6. Severe bilateral foraminal stenosis at L2-3, with severe right foraminal stenosis at L3-4. Electronically Signed   By: Jeannine Boga M.D.   On: 09/29/2016 22:52   Dg Op Swallowing Func-medicare/speech Path  Result Date: 09/27/2016 Objective Swallowing Evaluation: Type of Study: MBS-Modified Barium Swallow Study Patient Details Name: CORDULA WARR MRN: [id number] Date of Birth: 11/28/1941 Today's Date: 09/27/2016 Time: No Data Recorded-SLP Stop Time (ACUTE ONLY): 1310 (1340 time out) No Data  Recorded Past Medical History: Past Medical History: Diagnosis Date . Allergy  . Anemia   hx of  . Anxiety  . Chronic insomnia 04/22/2012 . COPD (chronic obstructive pulmonary disease) ([place])  .  Diverticulosis of colon  . GERD (gastroesophageal reflux disease)  . Glaucoma  . Hormone replacement therapy (postmenopausal)  . Hyperlipidemia  . Hypertension  . Low back pain  . Migraine headache   denies . Mild obesity  . Osteoarthritis  . Osteopenia  . Osteoporosis  . Pneumonia   hx of 5 years ago  . Scoliosis  . Shortness of breath   with exertion  . Spontaneous pneumothorax   1968 . Umbilical hernia [id number] Past Surgical History: Past Surgical History: Procedure Laterality Date . EXCISION/RELEASE BURSA HIP  04/26/2012  Procedure: EXCISION/RELEASE BURSA HIP;  Surgeon: Gearlean Alf, MD;  Location: [facility];  Service: Orthopedics;  Laterality: Right; . EYE SURGERY    - bil cataract surg and Retina surgery to left eye . HERNIA REPAIR  [id number]  umbilical hernia . HIP ADDUCTOR TENOTOMY   . IR GENERIC HISTORICAL  06/17/2016  IR RADIOLOGIST EVAL & MGMT 06/17/2016 [facility] . IR GENERIC HISTORICAL  07/01/2016  IR VERTEBROPLASTY LUMBAR BX INC UNI/BIL INC/INJECT/IMAGING 07/01/2016 Luanne Bras, MD [facility] . IR GENERIC HISTORICAL  07/16/2016  IR RADIOLOGIST EVAL & MGMT 07/16/2016 [facility] . LUMBAR DISC SURGERY     x 2 . OTHER SURGICAL HISTORY    benign growth removed from right under ear at age 89  . SHOULDER ARTHROSCOPY    left shoulder . TONSILLECTOMY   . TONSILLECTOMY   . WRIST FRACTURE SURGERY    plate in left wrist/ fractured 2 times HPI: pt is a 75 yo female referred by Dr Tat for MBS.  PMH + for DM, migraines, COPD, colon AVM, anemia, oral thrush, C3-C4 cervical osteophytes, anxiety, COPD, HLD.  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 Pt reported to MD problems swallowing food and drink- more so with liquids over the last few years but worsening  within the last year.     Subjective: pt awake in chair, family present Assessment / Plan / Recommendation CHL IP CLINICAL IMPRESSIONS 09/27/2016 Therapy Diagnosis Mild oral phase dysphagia;Mild pharyngeal phase dysphagia;Mild cervical esophageal phase dysphagia Clinical Impression Mild oropharyngeal and cervical esophageal dysphagia present. Decreased oral pressure results in premature spillage, mild oral residuals and piece-mealing.  Pharyngeal swallow largely strong with only minimal residuals. Pt did not aspirate or deeply penetrate with any bolus - minute penetration of thin/nectar to upper larynx cleared independently.  Pt did not demonstrate symptoms *coughing with intake* during MBS unfortunately.  She does appear with prominent cricopharyngeus and diverticulum trapping some barium but no backflow noted.  SLP questions if this could be source of pt choking.  SLP testing chin tuck posture with pt to allow use if indicated in future - no deficits in swallow noted with posture therefore advised use as needed.  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  Advised pt to maintain strong cough/voice/expectoration abilities as much as able due to possible progressive neurological disease.  Thanks for this consult.     Impact on safety and function Mild aspiration risk   CHL IP TREATMENT RECOMMENDATION 05/17/2016 Treatment Recommendations No treatment recommended at this time   No flowsheet data found. CHL IP DIET RECOMMENDATION 09/27/2016 SLP Diet Recommendations Regular solids;Thin liquid Liquid Administration via Cup;Straw Medication Administration Whole meds with puree Compensations Slow rate;Small sips/bites;Other (Comment) Postural Changes Remain semi-upright after after feeds/meals (Comment);Seated upright at 90 degrees   CHL IP OTHER  RECOMMENDATIONS 09/27/2016 Recommended Consults -- Oral Care Recommendations Oral care  BID Other Recommendations --   CHL IP FOLLOW UP RECOMMENDATIONS 05/17/2016 Follow up Recommendations None   No flowsheet data found.     CHL IP ORAL PHASE 09/27/2016 Oral Phase Impaired Oral - Pudding Teaspoon -- Oral - Pudding Cup -- Oral - Honey Teaspoon -- Oral - Honey Cup -- Oral - Nectar Teaspoon -- Oral - Nectar Cup Weak lingual manipulation;Reduced posterior propulsion Oral - Nectar Straw -- Oral - Thin Teaspoon -- Oral - Thin Cup Weak lingual manipulation;Reduced posterior propulsion;Lingual/palatal residue Oral - Thin Straw Weak lingual manipulation;Reduced posterior propulsion Oral - Puree Weak lingual manipulation;Reduced posterior propulsion;Lingual/palatal residue Oral - Mech Soft NT Oral - Regular Weak lingual manipulation;Reduced posterior propulsion;Lingual/palatal residue Oral - Multi-Consistency -- Oral - Pill WFL Oral Phase - Comment piecemealing, pt does not sense oral residuals but can clear with dry swallows that are cued  CHL IP PHARYNGEAL PHASE 09/27/2016 Pharyngeal Phase Impaired Pharyngeal- Pudding Teaspoon -- Pharyngeal -- Pharyngeal- Pudding Cup -- Pharyngeal -- Pharyngeal- Honey Teaspoon -- Pharyngeal -- Pharyngeal- Honey Cup -- Pharyngeal -- Pharyngeal- Nectar Teaspoon -- Pharyngeal Material enters airway, remains ABOVE vocal cords then ejected out Pharyngeal- Nectar Cup [facility];Penetration/Aspiration during swallow Pharyngeal Material enters airway, remains ABOVE vocal cords then ejected out Pharyngeal- Nectar Straw -- Pharyngeal -- Pharyngeal- Thin Teaspoon -- Pharyngeal -- Pharyngeal- Thin Cup Penetration/Aspiration during swallow Pharyngeal Material enters airway, remains ABOVE vocal cords then ejected out Pharyngeal- Thin Straw Penetration/Aspiration during swallow Pharyngeal Material enters airway, remains ABOVE vocal cords then ejected out Pharyngeal- Puree WFL Pharyngeal -- Pharyngeal- Mechanical Soft -- Pharyngeal -- Pharyngeal- Regular WFL Pharyngeal -- Pharyngeal-  Multi-consistency -- Pharyngeal -- Pharyngeal- Pill WFL Pharyngeal -- Pharyngeal Comment (No Data)  CHL IP CERVICAL ESOPHAGEAL PHASE 09/27/2016 Cervical Esophageal Phase Impaired Pudding Teaspoon -- Pudding Cup -- Honey Teaspoon -- Honey Cup -- Nectar Teaspoon -- Nectar Cup Reduced cricopharyngeal relaxation;Prominent cricopharyngeal segment Nectar Straw -- Thin Teaspoon -- Thin Cup Prominent cricopharyngeal segment Thin Straw Reduced cricopharyngeal relaxation;Prominent cricopharyngeal segment Puree Reduced cricopharyngeal relaxation;Prominent cricopharyngeal segment Mechanical Soft -- Regular Reduced cricopharyngeal relaxation;Prominent cricopharyngeal segment Multi-consistency -- Pill Reduced cricopharyngeal relaxation;Prominent cricopharyngeal segment Cervical Esophageal Comment barium tablet appeared to pause at proximal esophagus without pt awareness, clearing with further swallows, 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 Initial encounter. EXAM: MODIFIED BARIUM SWALLOW TECHNIQUE: Different consistencies of barium were administered orally to the patient by the Speech Pathologist. Imaging of the pharynx was performed in the lateral projection. FLUOROSCOPY TIME:  Fluoroscopy Time:  1 minutes and 6 seconds Radiation Exposure Index (if provided by the fluoroscopic device): 2.2 mGy Number of Acquired Spot Images: 0 COMPARISON:  None. FINDINGS: Thin liquid- trace laryngeal penetration. Question tiny Zenker's diverticulum. Nectar thick liquid- trace laryngeal penetration. Pure- trace laryngeal penetration. Minimal pooling in the valleculae which clears with subsequent  swallowing. Cracker-within normal limits Barium tablet - slightly delayed clearance from the cervical esophagus. IMPRESSION: Trace laryngeal penetration without aspiration. Question tiny Zenker's diverticulum. Slightly delayed clearance of ingested barium tablet. Please refer to the Speech Pathologists report for complete details and recommendations. Electronically Signed   By: Genia Del M.D.   On: 09/27/2016 14:16  Subjective: No further diarrhea. Back pain stable.  Discharge Exam: Vitals:   10/04/16 1337 10/04/16 2020  BP: (!) 152/81 (!) 143/70  Pulse: 65 71  Resp: 19 18  Temp: 98.6 F (37 C) 98.4 F (36.9 C)   Vitals:   10/04/16 0615 10/04/16 0959 10/04/16 1337 10/04/16 2020  BP: (!) 150/71 (!) 158/79 (!) 152/81 (!) 143/70  Pulse: 63 70 65 71  Resp: 18  19 18   Temp: 98.7 F (37.1 C)  98.6 F (37 C) 98.4 F (36.9 C)  TempSrc: Oral  Oral Oral  SpO2: 97%  100% 97%  Weight:      Height:        Gen: not in distress HEENT: moist mucosa, supple neck Chest: clear b/l, no added sounds CVS: N S1&S2, no murmurs, GI: soft, NT, ND Musculoskeletal: warm, no edema   The results of significant diagnostics from this hospitalization (including imaging, microbiology, ancillary and laboratory) are listed below for reference.     Microbiology: Recent Results (from the past 240 hour(s))  Urine culture     Status: Abnormal   Collection Time: 09/29/16 11:28 AM  Result Value Ref Range Status   Specimen Description URINE, RANDOM  Final   Special Requests NONE  Final   Culture >=100,000 COLONIES/mL KLEBSIELLA PNEUMONIAE (A)  Final   Report Status 10/01/2016 FINAL  Final   Organism ID, Bacteria KLEBSIELLA PNEUMONIAE (A)  Final      Susceptibility   Klebsiella pneumoniae - MIC*    AMPICILLIN >=32 RESISTANT Resistant     CEFAZOLIN <=4 SENSITIVE Sensitive     CEFTRIAXONE <=1 SENSITIVE Sensitive     CIPROFLOXACIN <=0.25 SENSITIVE Sensitive     GENTAMICIN <=1 SENSITIVE  Sensitive     IMIPENEM <=0.25 SENSITIVE Sensitive     NITROFURANTOIN 64 INTERMEDIATE Intermediate     TRIMETH/SULFA >=320 RESISTANT Resistant     AMPICILLIN/SULBACTAM 4 SENSITIVE Sensitive     PIP/TAZO <=4 SENSITIVE Sensitive     Extended ESBL NEGATIVE Sensitive     * >=100,000 COLONIES/mL KLEBSIELLA PNEUMONIAE  C difficile quick scan w PCR reflex     Status: None   Collection Time: 09/29/16  1:40 PM  Result Value Ref Range Status   C Diff antigen NEGATIVE NEGATIVE Final   C Diff toxin NEGATIVE NEGATIVE Final   C Diff interpretation No C. difficile detected.  Final  Gastrointestinal Panel by PCR , Stool     Status: Abnormal   Collection Time: 09/30/16  1:02 PM  Result Value Ref Range Status   Campylobacter species NOT DETECTED NOT DETECTED Final   Plesimonas shigelloides NOT DETECTED NOT DETECTED Final   Salmonella species NOT DETECTED NOT DETECTED Final   Yersinia enterocolitica NOT DETECTED NOT DETECTED Final   Vibrio species NOT DETECTED NOT DETECTED Final   Vibrio cholerae NOT DETECTED NOT DETECTED Final   Enteroaggregative E coli (EAEC) NOT DETECTED NOT DETECTED Final   Enteropathogenic E coli (EPEC) DETECTED (A) NOT DETECTED Final    Comment: RESULT CALLED TO, READ BACK BY AND VERIFIED WITH: SPOKE TO [facility] 09/30/16 @ 2008  [place] RESULT CALLED TO, READ BACK BY AND VERIFIED WITH: C. VENNABLE RN AT 2013 ON 09/30/16 BY S.VANHOORNE    Enterotoxigenic E coli (ETEC) NOT DETECTED NOT DETECTED Final   Shiga like toxin producing E coli (STEC) NOT DETECTED NOT DETECTED Final   E. coli O157 NOT DETECTED NOT DETECTED Final   Shigella/Enteroinvasive E coli (EIEC) NOT DETECTED NOT DETECTED Final  Cryptosporidium NOT DETECTED NOT DETECTED Final   Cyclospora cayetanensis NOT DETECTED NOT DETECTED Final   Entamoeba histolytica NOT DETECTED NOT DETECTED Final   Giardia lamblia NOT DETECTED NOT DETECTED Final   Adenovirus F40/41 NOT DETECTED NOT DETECTED Final   Astrovirus NOT DETECTED  NOT DETECTED Final   Norovirus GI/GII NOT DETECTED NOT DETECTED Final   Rotavirus A NOT DETECTED NOT DETECTED Final   Sapovirus (I, II, IV, and V) NOT DETECTED NOT DETECTED Final     Labs: BNP (last 3 results) No results for input(s): BNP in the last 8760 hours. Basic Metabolic Panel:  Recent Labs Lab 09/29/16 1128 09/30/16 2055 10/01/16 0345 10/02/16 0353  NA 136 134* 136 136  K 4.3 3.2* 3.1* 3.8  CL 103 101 102 106  CO2 28 26 28 25   GLUCOSE 101* 148* 123* 119*  BUN 14 15 12 10   CREATININE 0.70 0.73 0.65 0.52  CALCIUM 8.9 8.7* 8.5* 8.5*  MG  --  1.8  --   --    Liver Function Tests:  Recent Labs Lab 09/29/16 1128 10/01/16 0345  AST 26 18  ALT 9* <5*  ALKPHOS 68 55  BILITOT 0.7 0.8  PROT 6.4* 5.2*  ALBUMIN 3.6 2.9*    Recent Labs Lab 09/29/16 1128  LIPASE 20   No results for input(s): AMMONIA in the last 168 hours. CBC:  Recent Labs Lab 09/29/16 1128 10/01/16 0345  WBC 8.5 12.2*  HGB 12.4 11.4*  HCT 38.0 34.3*  MCV 99.5 99.4  PLT 233 208   Cardiac Enzymes: No results for input(s): CKTOTAL, CKMB, CKMBINDEX, TROPONINI in the last 168 hours. BNP: Invalid input(s): POCBNP CBG: No results for input(s): GLUCAP in the last 168 hours. D-Dimer No results for input(s): DDIMER in the last 72 hours. Hgb A1c No results for input(s): HGBA1C in the last 72 hours. Lipid Profile No results for input(s): CHOL, HDL, LDLCALC, TRIG, CHOLHDL, LDLDIRECT in the last 72 hours. Thyroid function studies No results for input(s): TSH, T4TOTAL, T3FREE, THYROIDAB in the last 72 hours.  Invalid input(s): FREET3 Anemia work up No results for input(s): VITAMINB12, FOLATE, FERRITIN, TIBC, IRON, RETICCTPCT in the last 72 hours. Urinalysis    Component Value Date/Time   COLORURINE YELLOW 09/29/2016 1128   APPEARANCEUR CLOUDY (A) 09/29/2016 1128   LABSPEC 1.014 09/29/2016 1128   PHURINE 7.0 09/29/2016 1128   GLUCOSEU NEGATIVE 09/29/2016 1128   GLUCOSEU NEGATIVE  07/08/2016 1445   HGBUR NEGATIVE 09/29/2016 1128   BILIRUBINUR NEGATIVE 09/29/2016 1128   KETONESUR NEGATIVE 09/29/2016 1128   PROTEINUR NEGATIVE 09/29/2016 1128   UROBILINOGEN 1.0 07/08/2016 1445   NITRITE POSITIVE (A) 09/29/2016 1128   LEUKOCYTESUR NEGATIVE 09/29/2016 1128   Sepsis Labs Invalid input(s): PROCALCITONIN,  WBC,  LACTICIDVEN Microbiology Recent Results (from the past 240 hour(s))  Urine culture     Status: Abnormal   Collection Time: 09/29/16 11:28 AM  Result Value Ref Range Status   Specimen Description URINE, RANDOM  Final   Special Requests NONE  Final   Culture >=100,000 COLONIES/mL KLEBSIELLA PNEUMONIAE (A)  Final   Report Status 10/01/2016 FINAL  Final   Organism ID, Bacteria KLEBSIELLA PNEUMONIAE (A)  Final      Susceptibility   Klebsiella pneumoniae - MIC*    AMPICILLIN >=32 RESISTANT Resistant     CEFAZOLIN <=4 SENSITIVE Sensitive     CEFTRIAXONE <=1 SENSITIVE Sensitive     CIPROFLOXACIN <=0.25 SENSITIVE Sensitive     GENTAMICIN <=1 SENSITIVE Sensitive  IMIPENEM <=0.25 SENSITIVE Sensitive     NITROFURANTOIN 64 INTERMEDIATE Intermediate     TRIMETH/SULFA >=320 RESISTANT Resistant     AMPICILLIN/SULBACTAM 4 SENSITIVE Sensitive     PIP/TAZO <=4 SENSITIVE Sensitive     Extended ESBL NEGATIVE Sensitive     * >=100,000 COLONIES/mL KLEBSIELLA PNEUMONIAE  C difficile quick scan w PCR reflex     Status: None   Collection Time: 09/29/16  1:40 PM  Result Value Ref Range Status   C Diff antigen NEGATIVE NEGATIVE Final   C Diff toxin NEGATIVE NEGATIVE Final   C Diff interpretation No C. difficile detected.  Final  Gastrointestinal Panel by PCR , Stool     Status: Abnormal   Collection Time: 09/30/16  1:02 PM  Result Value Ref Range Status   Campylobacter species NOT DETECTED NOT DETECTED Final   Plesimonas shigelloides NOT DETECTED NOT DETECTED Final   Salmonella species NOT DETECTED NOT DETECTED Final   Yersinia enterocolitica NOT DETECTED NOT DETECTED  Final   Vibrio species NOT DETECTED NOT DETECTED Final   Vibrio cholerae NOT DETECTED NOT DETECTED Final   Enteroaggregative E coli (EAEC) NOT DETECTED NOT DETECTED Final   Enteropathogenic E coli (EPEC) DETECTED (A) NOT DETECTED Final    Comment: RESULT CALLED TO, READ BACK BY AND VERIFIED WITH: SPOKE TO [facility] 09/30/16 @ 2008  [place] RESULT CALLED TO, READ BACK BY AND VERIFIED WITH: C. VENNABLE RN AT 2013 ON 09/30/16 BY S.VANHOORNE    Enterotoxigenic E coli (ETEC) NOT DETECTED NOT DETECTED Final   Shiga like toxin producing E coli (STEC) NOT DETECTED NOT DETECTED Final   E. coli O157 NOT DETECTED NOT DETECTED Final   Shigella/Enteroinvasive E coli (EIEC) NOT DETECTED NOT DETECTED Final   Cryptosporidium NOT DETECTED NOT DETECTED Final   Cyclospora cayetanensis NOT DETECTED NOT DETECTED Final   Entamoeba histolytica NOT DETECTED NOT DETECTED Final   Giardia lamblia NOT DETECTED NOT DETECTED Final   Adenovirus F40/41 NOT DETECTED NOT DETECTED Final   Astrovirus NOT DETECTED NOT DETECTED Final   Norovirus GI/GII NOT DETECTED NOT DETECTED Final   Rotavirus A NOT DETECTED NOT DETECTED Final   Sapovirus (I, II, IV, and V) NOT DETECTED NOT DETECTED Final     Time coordinating discharge: Over 30 minutes  SIGNED:   Louellen Molder, MD  [facility] 10/05/2016, 10:56 AM Pager   If 7PM-7AM, please contact night-coverage [email or web address] Password [email or web address]

## 2016-10-05 NOTE — Consult Note (Signed)
   [facility] CM Inpatient Consult   10/05/2016  SHARN PARLIN 1941/08/05 [id number]   Patient screened for [place] Management program. However, patient discharged to SNF. [facility] Care Management not needed.   Marthenia Rolling, MSN-Ed, RN,BSN [facility] Liaison [phone]

## 2016-10-05 NOTE — Clinical Social Work Placement (Signed)
Medical Social Worker facilitated patient discharge including contacting patient family and facility to confirm patient discharge plans.  Clinical information faxed to facility and family agreeable with plan.  MSW arranged ambulance transport via [place] to [place].  RN to call report prior to discharge [phone].  Medical Social Worker will sign off for now as social work intervention is no longer needed. Please consult [place] again if new need arises.  CLINICAL SOCIAL WORK PLACEMENT  NOTE  Date:  10/05/2016  Patient Details  Name: Tara CUTCHER MRN: [id number] Date of Birth: Dec 16, 1940  Clinical Social Work is seeking post-discharge placement for this patient at the [place] level of care (*CSW will initial, date and re-position this form in  chart as items are completed):  Yes   Patient/family provided with [place] Work Department's list of facilities offering this level of care within the geographic area requested by the patient (or if unable, by the patient's family).  Yes   Patient/family informed of their freedom to choose among providers that offer the needed level of care, that participate in Medicare, Medicaid or managed care program needed by the patient, have an available bed and are willing to accept the patient.  Yes   Patient/family informed of [facility]'s ownership interest in [facility] and [facility], as well as of the fact that they are under no obligation to receive care at these facilities.  PASRR submitted to EDS on 10/04/16     PASRR number received on       Existing PASRR number confirmed on 10/04/16     FL2 transmitted to all facilities in geographic area requested by pt/family on 10/04/16     FL2 transmitted to all facilities within larger geographic area on       Patient informed that his/her managed care company has contracts with or will negotiate with certain facilities, including the  following:        Yes   Patient/family informed of bed offers received.  Patient chooses bed at  ([facility] )     Physician recommends and patient chooses bed at      Patient to be transferred to  ([facility] ) on 10/05/16.  Patient to be transferred to facility by  [facility] )     Patient family notified on 10/05/16 of transfer.  Name of family member notified:   (Pt's spouse, Wynonia Lawman )     PHYSICIAN Please sign FL2, Please prepare priority discharge summary, including medications     Additional Comment:    _______________________________________________ Glendon Axe A 10/05/2016, 11:58 AM

## 2016-10-05 NOTE — Progress Notes (Signed)
Pt discharging to [facility] today.

## 2016-10-07 ENCOUNTER — Telehealth (HOSPITAL_COMMUNITY): Payer: Self-pay

## 2016-10-07 ENCOUNTER — Telehealth: Payer: Self-pay | Admitting: *Deleted

## 2016-10-07 NOTE — Telephone Encounter (Signed)
Pt was on TCM list admitted for Klebsiella UTI. Pt is being d/c to snf and will follow-up w/physicans there...Tara Neal

## 2016-10-07 NOTE — Telephone Encounter (Signed)
Called to schedule consult, left message for husband to return call. AW

## 2016-10-10 DIAGNOSIS — I10 Essential (primary) hypertension: Secondary | ICD-10-CM | POA: Diagnosis not present

## 2016-10-10 DIAGNOSIS — S22080G Wedge compression fracture of T11-T12 vertebra, subsequent encounter for fracture with delayed healing: Secondary | ICD-10-CM | POA: Diagnosis not present

## 2016-10-10 DIAGNOSIS — N390 Urinary tract infection, site not specified: Secondary | ICD-10-CM | POA: Diagnosis not present

## 2016-10-10 DIAGNOSIS — K5909 Other constipation: Secondary | ICD-10-CM | POA: Diagnosis not present

## 2016-10-10 DIAGNOSIS — E440 Moderate protein-calorie malnutrition: Secondary | ICD-10-CM | POA: Diagnosis not present

## 2016-10-10 DIAGNOSIS — G231 Progressive supranuclear ophthalmoplegia [Steele-Richardson-Olszewski]: Secondary | ICD-10-CM | POA: Diagnosis not present

## 2016-10-10 DIAGNOSIS — M0689 Other specified rheumatoid arthritis, multiple sites: Secondary | ICD-10-CM | POA: Diagnosis not present

## 2016-10-10 DIAGNOSIS — M545 Low back pain: Secondary | ICD-10-CM | POA: Diagnosis not present

## 2016-10-13 ENCOUNTER — Other Ambulatory Visit (HOSPITAL_COMMUNITY): Payer: Self-pay | Admitting: Interventional Radiology

## 2016-10-13 DIAGNOSIS — IMO0001 Reserved for inherently not codable concepts without codable children: Secondary | ICD-10-CM

## 2016-10-13 DIAGNOSIS — M4850XS Collapsed vertebra, not elsewhere classified, site unspecified, sequela of fracture: Principal | ICD-10-CM

## 2016-10-19 ENCOUNTER — Other Ambulatory Visit: Payer: Self-pay | Admitting: Radiology

## 2016-10-20 ENCOUNTER — Other Ambulatory Visit: Payer: Self-pay | Admitting: General Surgery

## 2016-10-22 ENCOUNTER — Encounter (HOSPITAL_COMMUNITY): Payer: Self-pay

## 2016-10-22 ENCOUNTER — Other Ambulatory Visit (HOSPITAL_COMMUNITY): Payer: Self-pay | Admitting: Interventional Radiology

## 2016-10-22 ENCOUNTER — Ambulatory Visit (HOSPITAL_COMMUNITY)
Admission: RE | Admit: 2016-10-22 | Discharge: 2016-10-22 | Disposition: A | Discharge status: Home / Self Care | Payer: Medicare Other | Source: Ambulatory Visit | Attending: Interventional Radiology | Admitting: Interventional Radiology

## 2016-10-22 DIAGNOSIS — Z9181 History of falling: Secondary | ICD-10-CM | POA: Insufficient documentation

## 2016-10-22 DIAGNOSIS — IMO0001 Reserved for inherently not codable concepts without codable children: Secondary | ICD-10-CM

## 2016-10-22 DIAGNOSIS — S22088A Other fracture of T11-T12 vertebra, initial encounter for closed fracture: Secondary | ICD-10-CM | POA: Insufficient documentation

## 2016-10-22 DIAGNOSIS — Z87891 Personal history of nicotine dependence: Secondary | ICD-10-CM | POA: Insufficient documentation

## 2016-10-22 DIAGNOSIS — J449 Chronic obstructive pulmonary disease, unspecified: Secondary | ICD-10-CM | POA: Insufficient documentation

## 2016-10-22 DIAGNOSIS — M546 Pain in thoracic spine: Secondary | ICD-10-CM | POA: Diagnosis not present

## 2016-10-22 DIAGNOSIS — M1990 Unspecified osteoarthritis, unspecified site: Secondary | ICD-10-CM | POA: Insufficient documentation

## 2016-10-22 DIAGNOSIS — Z6820 Body mass index (BMI) 20.0-20.9, adult: Secondary | ICD-10-CM | POA: Insufficient documentation

## 2016-10-22 DIAGNOSIS — E785 Hyperlipidemia, unspecified: Secondary | ICD-10-CM | POA: Insufficient documentation

## 2016-10-22 DIAGNOSIS — I10 Essential (primary) hypertension: Secondary | ICD-10-CM | POA: Insufficient documentation

## 2016-10-22 DIAGNOSIS — Z79899 Other long term (current) drug therapy: Secondary | ICD-10-CM | POA: Insufficient documentation

## 2016-10-22 DIAGNOSIS — S22078A Other fracture of T9-T10 vertebra, initial encounter for closed fracture: Secondary | ICD-10-CM | POA: Insufficient documentation

## 2016-10-22 DIAGNOSIS — M4850XS Collapsed vertebra, not elsewhere classified, site unspecified, sequela of fracture: Principal | ICD-10-CM

## 2016-10-22 DIAGNOSIS — M5124 Other intervertebral disc displacement, thoracic region: Secondary | ICD-10-CM | POA: Insufficient documentation

## 2016-10-22 DIAGNOSIS — E669 Obesity, unspecified: Secondary | ICD-10-CM | POA: Insufficient documentation

## 2016-10-22 DIAGNOSIS — K219 Gastro-esophageal reflux disease without esophagitis: Secondary | ICD-10-CM | POA: Insufficient documentation

## 2016-10-22 DIAGNOSIS — W19XXXA Unspecified fall, initial encounter: Secondary | ICD-10-CM | POA: Insufficient documentation

## 2016-10-22 DIAGNOSIS — M810 Age-related osteoporosis without current pathological fracture: Secondary | ICD-10-CM | POA: Insufficient documentation

## 2016-10-22 DIAGNOSIS — M4854XA Collapsed vertebra, not elsewhere classified, thoracic region, initial encounter for fracture: Secondary | ICD-10-CM | POA: Diagnosis not present

## 2016-10-22 HISTORY — PX: IR GENERIC HISTORICAL: IMG1180011

## 2016-10-22 LAB — BASIC METABOLIC PANEL
Anion gap: 9 (ref 5–15)
BUN: 14 mg/dL (ref 6–20)
CHLORIDE: 102 mmol/L (ref 101–111)
CO2: 24 mmol/L (ref 22–32)
CREATININE: 0.67 mg/dL (ref 0.44–1.00)
Calcium: 9.3 mg/dL (ref 8.9–10.3)
GFR calc non Af Amer: >60 mL/min (ref >60)
Glucose, Bld: 109 mg/dL — ABNORMAL HIGH (ref 65–99)
Potassium: 4.4 mmol/L (ref 3.5–5.1)
Sodium: 135 mmol/L (ref 135–145)

## 2016-10-22 LAB — CBC
HCT: 36.4 % (ref 36.0–46.0)
Hemoglobin: 12.1 g/dL (ref 12.0–15.0)
MCH: 33.2 pg (ref 26.0–34.0)
MCHC: 33.2 g/dL (ref 30.0–36.0)
MCV: 99.7 fL (ref 78.0–100.0)
PLATELETS: 400 10*3/uL (ref 150–400)
RBC: 3.65 MIL/uL — AB (ref 3.87–5.11)
RDW: 13.4 % (ref 11.5–15.5)
WBC: 9.8 10*3/uL (ref 4.0–10.5)

## 2016-10-22 LAB — PROTIME-INR
INR: 0.95
PROTHROMBIN TIME: 12.6 s (ref 11.4–15.2)

## 2016-10-22 LAB — APTT: aPTT: 29 seconds (ref 24–36)

## 2016-10-22 MED ORDER — TOBRAMYCIN SULFATE 1.2 G IJ SOLR
INTRAMUSCULAR | Status: AC
  Filled 2016-10-22: qty 1.2

## 2016-10-22 MED ORDER — TOBRAMYCIN SULFATE 1.2 G IJ SOLR
INTRAMUSCULAR | Status: AC | PRN
  Administered 2016-10-22: 1.2 g via TOPICAL

## 2016-10-22 MED ORDER — VANCOMYCIN HCL IN DEXTROSE 1-5 GM/200ML-% IV SOLN
1000.0000 mg | INTRAVENOUS | Status: AC
  Administered 2016-10-22: 1000 mg via INTRAVENOUS

## 2016-10-22 MED ORDER — BUPIVACAINE HCL (PF) 0.25 % IJ SOLN
INTRAMUSCULAR | Status: AC | PRN
  Administered 2016-10-22: 15 mL

## 2016-10-22 MED ORDER — GELATIN ABSORBABLE 12-7 MM EX MISC
CUTANEOUS | Status: AC
  Filled 2016-10-22: qty 1

## 2016-10-22 MED ORDER — VANCOMYCIN HCL IN DEXTROSE 1-5 GM/200ML-% IV SOLN
INTRAVENOUS | Status: AC
  Filled 2016-10-22: qty 200

## 2016-10-22 MED ORDER — MIDAZOLAM HCL 2 MG/2ML IJ SOLN
INTRAMUSCULAR | Status: AC
  Filled 2016-10-22: qty 4

## 2016-10-22 MED ORDER — FENTANYL CITRATE (PF) 100 MCG/2ML IJ SOLN
INTRAMUSCULAR | Status: AC
  Filled 2016-10-22: qty 4

## 2016-10-22 MED ORDER — IOPAMIDOL (ISOVUE-300) INJECTION 61%
INTRAVENOUS | Status: AC
  Administered 2016-10-22: 5 mL
  Filled 2016-10-22: qty 50

## 2016-10-22 MED ORDER — FENTANYL CITRATE (PF) 100 MCG/2ML IJ SOLN
INTRAMUSCULAR | Status: AC | PRN
  Administered 2016-10-22: 25 ug via INTRAVENOUS

## 2016-10-22 MED ORDER — SODIUM CHLORIDE 0.9 % IV SOLN: INTRAVENOUS | Status: AC

## 2016-10-22 MED ORDER — SODIUM CHLORIDE 0.9 % IV SOLN: INTRAVENOUS | Status: DC

## 2016-10-22 MED ORDER — SODIUM CHLORIDE 0.9 % IV SOLN
INTRAVENOUS | Status: AC | PRN
  Administered 2016-10-22: 10 mL/h via INTRAVENOUS

## 2016-10-22 MED ORDER — MIDAZOLAM HCL 2 MG/2ML IJ SOLN
INTRAMUSCULAR | Status: AC | PRN
  Administered 2016-10-22: 1 mg via INTRAVENOUS

## 2016-10-22 MED ORDER — BUPIVACAINE HCL (PF) 0.25 % IJ SOLN
INTRAMUSCULAR | Status: AC
  Filled 2016-10-22: qty 30

## 2016-10-22 NOTE — Discharge Instructions (Signed)
KYPHOPLASTY/VERTEBROPLASTY DISCHARGE INSTRUCTIONS  Medications: (check all that apply)     Resume all home medications as before procedure.       Resume your (aspirin/Plavix/Coumadin)  n/a             Continue your pain medications as prescribed as needed.  Over the next 3-5 days, decrease your pain medication as tolerated.  Over the counter medications (i.e. Tylenol, ibuprofen, and aleve) may be substituted once severe/moderate pain symptoms have subsided.   Wound Care: - Bandages may be removed the day following your procedure.  You may get your incision wet once bandages are removed.  Bandaids may be used to cover the incisions until scab formation.  Topical ointments are optional.  - If you develop a fever greater than 101 degrees, have increased skin redness at the incision sites or pus-like oozing from incisions occurring within 1 week of the procedure, contact radiology at [phone] or [phone].  - Ice pack to back for 15-20 minutes 2-3 time per day for first 2-3 days post procedure.  The ice will expedite muscle healing and help with the pain from the incisions.   Activity: - Bedrest today with limited activity for 24 hours post procedure.  - No driving for 48 hours.  - Increase your activity as tolerated after bedrest (with assistance if necessary).  - Refrain from any strenuous activity or heavy lifting (greater than 10 lbs.).   Follow up: - Contact radiology at [phone] or [phone] if any questions/concerns.  - A physician assistant from radiology will contact you in approximately 1 week.  - If a biopsy was performed at the time of your procedure, your referring physician should receive the results in usually 2-3 days.        1.No stooping,bending or lifting more than 10 lbs for 2 weeks 2.Use walker to ambulate. 3.RTC PRN 2 weeks

## 2016-10-22 NOTE — H&P (Signed)
Chief Complaint: Patient was seen in consultation today for thoracic 10 and 11 vertebroplasty/kyphoplasty at the request of  Dr Leanna Battles  Referring Physician(s): Dr Bevelyn Buckles  Supervising Physician: Luanne Bras  Patient Status: [name]  History of Present Illness: Tara Neal is a 75 y.o. female   Pt with worsening back pain for 6-8 weeks Falls at home and now living in [facility] Lumbar 5 vertebroplasty 07/01/16 Did well for few weeks ---new falls at home Back pain worsening even with pain meds MRI 09/29/2016: MRI THORACIC IMPRESSION:  1. Acute T11 compression fracture with 65% height loss and 5 mm bony retropulsion. Resultant mild canal stenosis. 2. Acute compression fracture through the inferior endplate of QA348G with mild 20% height loss without bony retropulsion. 3. No evidence for traumatic cord injury within the thoracic spine. 4. Degenerative disc bulge with right paracentral disc protrusion at T10-11 with resultant mild canal stenosis. 5. Small layering left pleural effusion.  Request made for T10 and 11 VP/KP Now scheduled for same   Past Medical History:  Diagnosis Date  . Allergy   . Anemia    hx of   . Anxiety   . Chronic insomnia 04/22/2012  . COPD (chronic obstructive pulmonary disease) ([place])   . Diverticulosis of colon   . GERD (gastroesophageal reflux disease)   . Glaucoma   . Hormone replacement therapy (postmenopausal)   . Hyperlipidemia   . Hypertension   . Low back pain   . Migraine headache    denies  . Mild obesity   . Osteoarthritis   . Osteopenia   . Osteoporosis   . Pneumonia    hx of 5 years ago   . Scoliosis   . Shortness of breath    with exertion   . Spontaneous pneumothorax    1968  . Umbilical hernia [id number]    Past Surgical History:  Procedure Laterality Date  . EXCISION/RELEASE BURSA HIP  04/26/2012   Procedure: EXCISION/RELEASE BURSA HIP;  Surgeon: Gearlean Alf, MD;  Location: [facility];  Service: Orthopedics;  Laterality: Right;  . EYE SURGERY     - bil cataract surg and Retina surgery to left eye  . HERNIA REPAIR  [id number]   umbilical hernia  . HIP ADDUCTOR TENOTOMY    . IR GENERIC HISTORICAL  06/17/2016   IR RADIOLOGIST EVAL & MGMT 06/17/2016 [facility]  . IR GENERIC HISTORICAL  07/01/2016   IR VERTEBROPLASTY LUMBAR BX INC UNI/BIL INC/INJECT/IMAGING 07/01/2016 Luanne Bras, MD [facility]  . IR GENERIC HISTORICAL  07/16/2016   IR RADIOLOGIST EVAL & MGMT 07/16/2016 [facility]  . LUMBAR DISC SURGERY      x 2  . OTHER SURGICAL HISTORY     benign growth removed from right under ear at age 54   . SHOULDER ARTHROSCOPY     left shoulder  . TONSILLECTOMY    . TONSILLECTOMY    . WRIST FRACTURE SURGERY     plate in left wrist/ fractured 2 times    Allergies: Codeine; Sulfonamide derivatives; Penicillins; and Percocet [oxycodone-acetaminophen]  Medications: Prior to Admission medications   Medication Sig Start Date End Date Taking? Authorizing Provider  acetaminophen (TYLENOL) 325 MG tablet Take 650 mg by mouth at bedtime.   Yes Historical Provider, MD  Ascorbic Acid (VITAMIN C) 1000 MG tablet Take 1,000 mg by mouth daily.   Yes Historical Provider, MD  atenolol (TENORMIN) 50 MG tablet TAKE 1 TABLET EVERY DAY Patient  taking differently: TAKE 50 mg TABLET by mouth once daily 03/15/16  Yes Biagio Borg, MD  benazepril (LOTENSIN) 40 MG tablet TAKE 1 TABLET EVERY DAY Patient taking differently: No sig reported 03/15/16  Yes Biagio Borg, MD  calcium carbonate (OS-CAL) 600 MG TABS Take 600 mg by mouth 2 (two) times daily with a meal.   Yes Historical Provider, MD  carbidopa-levodopa (SINEMET IR) 25-100 MG tablet Take 1 tablet by mouth 3 (three) times daily. 09/20/16  Yes Rebecca S Tat, DO  Cholecalciferol (VITAMIN D3) 1000 UNITS CAPS Take 1 capsule by mouth daily.   Yes Historical Provider, MD  feeding supplement, ENSURE ENLIVE, (ENSURE ENLIVE) LIQD Take 237 mLs  by mouth 2 (two) times daily between meals. 10/05/16  Yes Nishant Dhungel, MD  folic acid (FOLVITE) 1 MG tablet Take 1 mg by mouth daily.   Yes Historical Provider, MD  gabapentin (NEURONTIN) 300 MG capsule Take 300 mg by mouth 3 (three) times daily.   Yes Historical Provider, MD  HYDROcodone-acetaminophen (NORCO/VICODIN) 5-325 MG tablet Take 1 tablet by mouth every 8 (eight) hours as needed for moderate pain. 10/02/16  Yes Robbie Lis, MD  latanoprost (XALATAN) 0.005 % ophthalmic solution Place 1 drop into both eyes at bedtime.  10/09/13  Yes Historical Provider, MD  magnesium hydroxide (MILK OF MAGNESIA) 400 MG/5ML suspension Take 30 mLs by mouth daily as needed for mild constipation.   Yes Historical Provider, MD  Melatonin 3 MG TABS Take 6 mg by mouth at bedtime.    Yes Historical Provider, MD  meloxicam (MOBIC) 7.5 MG tablet Take 15 mg by mouth daily.  07/19/16  Yes Historical Provider, MD  methotrexate 250 MG/10ML injection Inject 1 mL (25 mg total) into the skin once a week. 05/19/16  Yes Velvet Bathe, MD  Multiple Vitamin (MULTIVITAMIN) tablet Take 1 tablet by mouth daily.   Yes Historical Provider, MD  Omega-3 Fatty Acids (FISH OIL) 1200 MG CAPS Take 2 capsules by mouth daily.    Yes Historical Provider, MD  omeprazole (PRILOSEC) 20 MG capsule TAKE 2 CAPSULES EVERY DAY Patient taking differently: Take 2 capsules by mouth daily at bedtime. 03/15/16  Yes Biagio Borg, MD  polyethylene glycol [facility] Floria Raveling) packet Take 17 g by mouth daily.   Yes Historical Provider, MD  sennosides-docusate sodium (SENOKOT-S) 8.6-50 MG tablet Take 1 tablet by mouth daily.   Yes Historical Provider, MD  simvastatin (ZOCOR) 40 MG tablet TAKE 1 TABLET EVERY DAY  AT  6PM Patient taking differently: No sig reported 03/15/16  Yes Biagio Borg, MD  timolol (TIMOPTIC) 0.5 % ophthalmic solution Place 1 drop into both eyes 2 (two) times daily.    Yes Historical Provider, MD  venlafaxine XR (EFFEXOR-XR) 150 MG 24 hr  capsule TAKE 1 CAPSULE EVERY DAY WITH BREAKFAST Patient taking differently: No sig reported 03/15/16  Yes Biagio Borg, MD     Family History  Problem Relation Age of Onset  . Hyperlipidemia Mother   . Heart disease Mother   . Diabetes Mother     Social History   Social History  . Marital status: Married    Spouse name: N/A  . Number of children: N/A  . Years of education: N/A   Social History Main Topics  . Smoking status: Former Smoker    Years: 20.00    Types: Cigarettes    Quit date: 11/29/1981  . Smokeless tobacco: Never Used  . Alcohol use Yes  Comment: one glass of wine every 3 months  . Drug use: No  . Sexual activity: Not Asked   Other Topics Concern  . 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  All other systems are negative.  Review of Systems  Constitutional: Positive for activity change, appetite change and fatigue. Negative for fever.  Respiratory: Negative for shortness of breath.   Cardiovascular: Negative for chest pain.  Gastrointestinal: Negative for abdominal pain.  Musculoskeletal: Positive for back pain and gait problem.  Neurological: Positive for weakness.  Psychiatric/Behavioral: Negative for behavioral problems and confusion.    Vital Signs: BP (!) 159/75   Pulse 68   Temp 97.9 F (36.6 C) (Oral)   Resp 16   Ht 5' 4.5" (1.638 m)   Wt 120 lb (54.4 kg)   SpO2 100%   BMI 20.28 kg/m   Physical Exam  Constitutional: She is oriented to person, place, and time.  Thin/frail  Cardiovascular: Normal rate, regular rhythm and normal heart sounds.   Pulmonary/Chest: Effort normal and breath sounds normal. She has no wheezes.  Abdominal: Soft. Bowel sounds are normal. There is no tenderness.  Musculoskeletal: Normal range of motion.  Definite mid to low back pain  Neurological: She is alert and oriented to  person, place, and time.  Skin: Skin is warm and dry.  Psychiatric: She has a normal mood and affect. Her behavior is normal. Judgment and thought content normal.  Nursing note and vitals reviewed.   Mallampati Score:  MD Evaluation Airway: WNL Heart: WNL Abdomen: WNL Chest/ Lungs: WNL ASA  Classification: 3 Mallampati/Airway Score: One  Imaging: Dg Chest 2 View  Result Date: 09/29/2016 CLINICAL DATA:  Cough for 2-3 days EXAM: CHEST  2 VIEW COMPARISON:  05/04/2016 FINDINGS: The There is no focal parenchymal opacity. There is no pleural effusion or pneumothorax. The heart and mediastinal contours are unremarkable. There is a T11 vertebral body compression fracture new compared with 05/15/2016. IMPRESSION: No active cardiopulmonary disease. Age indeterminate T11 vertebral body compression fracture new compared with 05/15/2016. Electronically Signed   By: Kathreen Devoid   On: 09/29/2016 13:35   Ct Thoracic Spine Wo Contrast  Result Date: 09/29/2016 CLINICAL DATA:  Abnormal chest x-ray with T11 compression fracture. Back pain. Multiple recent falls. History of L5 compression fracture status post augmentation. EXAM: CT THORACIC SPINE WITHOUT CONTRAST TECHNIQUE: Multidetector CT imaging of the thoracic spine was performed without intravenous contrast administration. Multiplanar CT image reconstructions were also generated. COMPARISON:  Chest radiographs 09/29/2016. Lumbar spine MRI 06/02/2016. Lumbar spine CT 05/15/2016. FINDINGS: Alignment: Mild lower thoracic dextroscoliosis. Trace anterolisthesis of C7 on T1. Vertebrae: As described on earlier chest radiographs, there is a T11 superior endplate compression fracture with approximately 65% height loss. This is new from the prior MRI and is felt to be recent as the fracture line is still visible. Retropulsion of the posterior T11 vertebral body cortex measures 5 mm and results in mild spinal stenosis. There is also a new T10 inferior endplate fracture  with mild focal depression anteriorly but no significant generalized vertebral body height loss or retropulsion. The bones appear osteopenic. Paraspinal and other soft tissues: Scarring in the lung apices with mild bronchiectasis in the right upper lobe. Mild centrilobular emphysema. Aortic atherosclerosis. Disc levels: Mild disc degeneration in the lower thoracic spine with degenerative endplate sclerosis. There  is a small calcified right paracentral disc protrusion at T10-11 which results in mild right-sided spinal stenosis, present on the prior MRI though better visualized on the current examination. Thoracic facet arthrosis is most advanced at T12-L1 on the left. IMPRESSION: 1. Likely recent T11 vertebral fracture with 65% height loss and 5 mm retropulsion resulting in mild spinal stenosis. 2. T10 inferior endplate fracture without significant height loss. 3. Small right paracentral disc protrusion at T10-11 with mild spinal canal narrowing. Electronically Signed   By: Logan Bores M.D.   On: 09/29/2016 16:00   Mr Thoracic Spine Wo Contrast  Result Date: 09/29/2016 CLINICAL DATA:  Initial evaluation for acute back pain, history of multiple recent falls. EXAM: MRI  THORACIC AND LUMBAR SPINE WITHOUT CONTRAST TECHNIQUE: Multiplanar and multiecho pulse sequences of the thoracic and lumbar spine, were obtained without intravenous contrast. COMPARISON:  Comparison made with prior CT from earlier the same day. S the E FINDINGS: MRI THORACIC SPINE FINDINGS Alignment: Accentuation of the normal thoracic kyphosis. Mild dextroscoliosis of the lower thoracic spine. Vertebrae: Abnormal T1 hypointense, stir hyperintense signal intensity extends through the inferior endplate of T1, compatible with acute compression fracture. Minimal height loss of up to 25%. No significant bony retropulsion. There is an additional acute compression fracture involving the T11 vertebral body. Associated height loss of approximately 65%. Bony  retropulsion measures 5 mm. Resultant mild spinal canal stenosis. Suspected associated fracture of the T11 spinous process which demonstrates marrow edema, with surrounding edema within the adjacent soft tissues (series 10, image 40). Vertebral body heights otherwise maintained. No other acute fracture identified. Minimal STIR signal intensity within the T5 vertebral body favored to be related to reactive endplate changes. Signal intensity within the vertebral body bone marrow otherwise within normal limits. Benign hemangioma noted within the T12 and L1 vertebral body. No worrisome osseous lesions. Cord: Signal intensity within the thoracic spinal cord is normal. No evidence for traumatic cord injury. Paraspinal and other soft tissues: Paraspinous soft tissues demonstrate no acute abnormality. Small layering left pleural effusion noted. Disc levels: Limited views of the cervical spine on counter sequence demonstrate reversal of the normal cervical lordosis with multilevel degenerative spondylolysis. Scattered multilevel facet hypertrophy noted throughout much of the upper and mid thoracic spine. Minimal disc bulge at T2-3 and T8-9. No other significant disc bulging through the T9-10 level. No focal disc protrusions. No canal or foraminal stenosis from the T1-2 level through the T9-10 level. T10-11: Diffuse disc bulge with disc desiccation and intervertebral disc space narrowing. Superimposed right paracentral disc protrusion indents and flattens the ventral thecal sac with mild flattening of the right hemi cord (series 9, image 35). No cord signal changes. Superimposed posterior element hypertrophy. Mild canal and bilateral foraminal stenosis. T11-12: 5 mm bony retropulsion related to the T11 compression fracture. Retro falls ball lung indents the ventral thecal sac results in mild canal stenosis (series 9, image 38). Superimposed mild facet hypertrophy. No significant foraminal encroachment. T12-L1: Diffuse  degenerative disc bulge with disc desiccation. Superimposed left paracentral disc protrusion mildly flattens and indents the ventral thecal sac without significant canal stenosis (series 9, image 44). No significant foraminal narrowing. Mild edema about the left T11-12 facet at favored to be degenerative in nature. No definite fracture through the facet itself MRI LUMBAR SPINE FINDINGS Segmentation: Normal segmentation. Lowest well-formed disc is labeled the L5-S1 level. Alignment: 40 degrees of levoscoliosis with apex at L3 again seen, stable. Mild retrolisthesis of L1 on L2 and L2 on L3  also unchanged. Trace anterolisthesis of L5 on S1 also stable. Vertebrae: Chronic degenerative endplate Schmorl's node with associated height loss at the superior endplate of L3 again noted, stable. Since the prior study, there has been interval performance of vertebral augmentation at the L5 level. Vertebral body height at this level it is relatively stable. Known progressive fragmentation or bony retropulsion. Previously seen edema within the inferior endplate of L5 has largely resolved. Acute fractures involving the T10 and T11 vertebral bodies noted, described on the thoracic portion of this exam. Vertebral body heights otherwise maintained. No other acute fracture. Prominent benign hemangioma again noted at T12 and L1. No worrisome osseous lesions. Signal intensity within the vertebral body bone marrow otherwise within normal limits. Conus medullaris: Extends to the L1-2 level and appears normal. Paraspinal and other soft tissues: Paraspinous soft tissues demonstrate no acute abnormality. Fatty atrophy noted within the paraspinous musculature. Cholelithiasis noted. Visualized visceral structures otherwise grossly unremarkable. Disc levels: L1-2: Trace retrolisthesis. Diffuse degenerative disc bulge with facet hypertrophy. No significant canal stenosis. Mild bilateral foraminal stenosis. L2-3: Trace retrolisthesis. Diffuse  degenerative disc bulge with moderate bilateral facet arthrosis with ligamentum flavum hypertrophy. Moderate to severe canal stenosis and right lateral recess stenosis is stable. Fairly severe bilateral foraminal narrowing also unchanged, worse on the right. L3-4: Diffuse degenerative disc bulge with advanced bilateral facet arthrosis, right greater than left. Moderate canal stenosis, stable. Severe right with more mild left foraminal narrowing also stable. L4-5: Diffuse degenerative disc bulge with advanced facet arthrosis. Moderate canal stenosis, stable. Left greater than right lateral recess narrowing. Mild right with more moderate left foraminal stenosis. L5-S1: Degenerative disc desiccation with mild disc bulge. Severe facet arthrosis. 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 No significant canal stenosis. Severe left foraminal stenosis with probable compression of the left L5 nerve root again noted. More mild to moderate right foraminal stenosis. No complication identified status post recent vertebral augmentation. IMPRESSION: MRI THORACIC IMPRESSION: 1. Acute T11 compression fracture with 65% height loss and 5 mm bony retropulsion. Resultant mild canal stenosis. 2. Acute compression fracture through the inferior endplate of QA348G with mild 20% height loss without bony retropulsion. 3. No evidence for traumatic cord injury within the thoracic spine. 4. Degenerative disc bulge with right paracentral disc protrusion at T10-11 with resultant mild canal stenosis. 5. Small layering left pleural effusion. MRI LUMBAR IMPRESSION: 1. Sequelae of interval vertebral augmentation at L5 without complication. 2. No other acute traumatic injury within the lumbar spine. Stable chronic L3 fracture. 3. 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 4. Moderate to severe spinal stenosis at L2-3, with  more moderate canal narrowing at L3-4 and L4-5. 5. Severe left foraminal stenosis at L5-S1 with compression of the L5 nerve root. 6. Severe bilateral foraminal stenosis at L2-3, with severe right foraminal stenosis at L3-4. Electronically Signed   By: Jeannine Boga M.D.   On: 09/29/2016 22:52   Mr Lumbar Spine Wo Contrast  Result Date: 09/29/2016 CLINICAL DATA:  Initial evaluation for acute back pain, history of multiple recent falls. EXAM: MRI  THORACIC AND LUMBAR SPINE WITHOUT CONTRAST TECHNIQUE: Multiplanar and multiecho pulse sequences of the thoracic and lumbar spine, were obtained without intravenous contrast. COMPARISON:  Comparison made with prior CT from earlier the same day. S the E FINDINGS: MRI THORACIC SPINE FINDINGS Alignment: Accentuation of the normal thoracic kyphosis. Mild dextroscoliosis of the lower thoracic spine. Vertebrae: Abnormal T1  hypointense, stir hyperintense signal intensity extends through the inferior endplate of T1, compatible with acute compression fracture. Minimal height loss of up to 25%. No significant bony retropulsion. There is an additional acute compression fracture involving the T11 vertebral body. Associated height loss of approximately 65%. Bony retropulsion measures 5 mm. Resultant mild spinal canal stenosis. Suspected associated fracture of the T11 spinous process which demonstrates marrow edema, with surrounding edema within the adjacent soft tissues (series 10, image 40). Vertebral body heights otherwise maintained. No other acute fracture identified. Minimal STIR signal intensity within the T5 vertebral body favored to be related to reactive endplate changes. Signal intensity within the vertebral body bone marrow otherwise within normal limits. Benign hemangioma noted within the T12 and L1 vertebral body. No worrisome osseous lesions. Cord: Signal intensity within the thoracic spinal cord is normal. No evidence for traumatic cord injury. Paraspinal and  other soft tissues: Paraspinous soft tissues demonstrate no acute abnormality. Small layering left pleural effusion noted. Disc levels: Limited views of the cervical spine on counter sequence demonstrate reversal of the normal cervical lordosis with multilevel degenerative spondylolysis. Scattered multilevel facet hypertrophy noted throughout much of the upper and mid thoracic spine. Minimal disc bulge at T2-3 and T8-9. No other significant disc bulging through the T9-10 level. No focal disc protrusions. No canal or foraminal stenosis from the T1-2 level through the T9-10 level. T10-11: Diffuse disc bulge with disc desiccation and intervertebral disc space narrowing. Superimposed right paracentral disc protrusion indents and flattens the ventral thecal sac with mild flattening of the right hemi cord (series 9, image 35). No cord signal changes. Superimposed posterior element hypertrophy. Mild canal and bilateral foraminal stenosis. T11-12: 5 mm bony retropulsion related to the T11 compression fracture. Retro falls ball lung indents the ventral thecal sac results in mild canal stenosis (series 9, image 38). Superimposed mild facet hypertrophy. No significant foraminal encroachment. T12-L1: Diffuse degenerative disc bulge with disc desiccation. Superimposed left paracentral disc protrusion mildly flattens and indents the ventral thecal sac without significant canal stenosis (series 9, image 44). No significant foraminal narrowing. Mild edema about the left T11-12 facet at favored to be degenerative in nature. No definite fracture through the facet itself MRI LUMBAR SPINE FINDINGS Segmentation: Normal segmentation. Lowest well-formed disc is labeled the L5-S1 level. Alignment: 40 degrees of levoscoliosis with apex at L3 again seen, stable. Mild retrolisthesis of L1 on L2 and L2 on L3 also unchanged. Trace anterolisthesis of L5 on S1 also stable. Vertebrae: Chronic degenerative endplate Schmorl's node with associated  height loss at the superior endplate of L3 again noted, stable. Since the prior study, there has been interval performance of vertebral augmentation at the L5 level. Vertebral body height at this level it is relatively stable. Known progressive fragmentation or bony retropulsion. Previously seen edema within the inferior endplate of L5 has largely resolved. Acute fractures involving the T10 and T11 vertebral bodies noted, described on the thoracic portion of this exam. Vertebral body heights otherwise maintained. No other acute fracture. Prominent benign hemangioma again noted at T12 and L1. No worrisome osseous lesions. Signal intensity within the vertebral body bone marrow otherwise within normal limits. Conus medullaris: Extends to the L1-2 level and appears normal. Paraspinal and other soft tissues: Paraspinous soft tissues demonstrate no acute abnormality. Fatty atrophy noted within the paraspinous musculature. Cholelithiasis noted. Visualized visceral structures otherwise grossly unremarkable. Disc levels: L1-2: Trace retrolisthesis. Diffuse degenerative disc bulge with facet hypertrophy. No significant canal stenosis. Mild bilateral foraminal stenosis. L2-3: Trace  retrolisthesis. Diffuse degenerative disc bulge with moderate bilateral facet arthrosis with ligamentum flavum hypertrophy. Moderate to severe canal stenosis and right lateral recess stenosis is stable. Fairly severe bilateral foraminal narrowing also unchanged, worse on the right. L3-4: Diffuse degenerative disc bulge with advanced bilateral facet arthrosis, right greater than left. Moderate canal stenosis, stable. Severe right with more mild left foraminal narrowing also stable. L4-5: Diffuse degenerative disc bulge with advanced facet arthrosis. Moderate canal stenosis, stable. Left greater than right lateral recess narrowing. Mild right with more moderate left foraminal stenosis. L5-S1: Degenerative disc desiccation with mild disc bulge. Severe  facet arthrosis. 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 No significant canal stenosis. Severe left foraminal stenosis with probable compression of the left L5 nerve root again noted. More mild to moderate right foraminal stenosis. No complication identified status post recent vertebral augmentation. IMPRESSION: MRI THORACIC IMPRESSION: 1. Acute T11 compression fracture with 65% height loss and 5 mm bony retropulsion. Resultant mild canal stenosis. 2. Acute compression fracture through the inferior endplate of QA348G with mild 20% height loss without bony retropulsion. 3. No evidence for traumatic cord injury within the thoracic spine. 4. Degenerative disc bulge with right paracentral disc protrusion at T10-11 with resultant mild canal stenosis. 5. Small layering left pleural effusion. MRI LUMBAR IMPRESSION: 1. Sequelae of interval vertebral augmentation at L5 without complication. 2. No other acute traumatic injury within the lumbar spine. Stable chronic L3 fracture. 3. 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 4. Moderate to severe spinal stenosis at L2-3, with more moderate canal narrowing at L3-4 and L4-5. 5. Severe left foraminal stenosis at L5-S1 with compression of the L5 nerve root. 6. Severe bilateral foraminal stenosis at L2-3, with severe right foraminal stenosis at L3-4. Electronically Signed   By: Jeannine Boga M.D.   On: 09/29/2016 22:52   Dg Op Swallowing Func-medicare/speech Path  Result Date: 09/27/2016 Objective Swallowing Evaluation: Type of Study: MBS-Modified Barium Swallow Study Patient Details Name: ELLABELLE DEPAOLA MRN: [id number] Date of Birth: 02-Dec-1940 Today's Date: 09/27/2016 Time: No Data Recorded-SLP Stop Time (ACUTE ONLY): 1310 (1340 time out) No Data Recorded Past Medical History: Past Medical History: Diagnosis Date . Allergy  . Anemia   hx of  .  Anxiety  . Chronic insomnia 04/22/2012 . COPD (chronic obstructive pulmonary disease) ([place])  . Diverticulosis of colon  . GERD (gastroesophageal reflux disease)  . Glaucoma  . Hormone replacement therapy (postmenopausal)  . Hyperlipidemia  . Hypertension  . Low back pain  . Migraine headache   denies . Mild obesity  . Osteoarthritis  . Osteopenia  . Osteoporosis  . Pneumonia   hx of 5 years ago  . Scoliosis  . Shortness of breath   with exertion  . Spontaneous pneumothorax   1968 . Umbilical hernia [id number] Past Surgical History: Past Surgical History: Procedure Laterality Date . EXCISION/RELEASE BURSA HIP  04/26/2012  Procedure: EXCISION/RELEASE BURSA HIP;  Surgeon: Gearlean Alf, MD;  Location: [facility];  Service: Orthopedics;  Laterality: Right; . EYE SURGERY    - bil cataract surg and Retina surgery to left eye . HERNIA REPAIR  [id number]  umbilical hernia . HIP ADDUCTOR TENOTOMY   . IR GENERIC HISTORICAL  06/17/2016  IR RADIOLOGIST EVAL & MGMT 06/17/2016 [facility] . IR GENERIC HISTORICAL  07/01/2016  IR VERTEBROPLASTY LUMBAR BX INC UNI/BIL INC/INJECT/IMAGING 07/01/2016 Luanne Bras, MD [facility] . IR GENERIC HISTORICAL  07/16/2016  IR RADIOLOGIST EVAL & MGMT 07/16/2016 [facility] . LUMBAR DISC SURGERY     x 2 . OTHER SURGICAL HISTORY    benign growth removed from right under ear at age 2  . SHOULDER ARTHROSCOPY    left shoulder . TONSILLECTOMY   . TONSILLECTOMY   . WRIST FRACTURE SURGERY    plate in left wrist/ fractured 2 times HPI: pt is a 75 yo female referred by Dr Tat for MBS.  PMH + for DM, migraines, COPD, colon AVM, anemia, oral thrush, C3-C4 cervical osteophytes, anxiety, COPD, HLD.  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 Pt reported to MD problems swallowing food and drink- more so with liquids over the last few years but worsening within the last year.     Subjective: pt awake in chair, family present Assessment / Plan /  Recommendation CHL IP CLINICAL IMPRESSIONS 09/27/2016 Therapy Diagnosis Mild oral phase dysphagia;Mild pharyngeal phase dysphagia;Mild cervical esophageal phase dysphagia Clinical Impression Mild oropharyngeal and cervical esophageal dysphagia present. Decreased oral pressure results in premature spillage, mild oral residuals and piece-mealing.  Pharyngeal swallow largely strong with only minimal residuals. Pt did not aspirate or deeply penetrate with any bolus - minute penetration of thin/nectar to upper larynx cleared independently.  Pt did not demonstrate symptoms *coughing with intake* during MBS unfortunately.  She does appear with prominent cricopharyngeus and diverticulum trapping some barium but no backflow noted.  SLP questions if this could be source of pt choking.  SLP testing chin tuck posture with pt to allow use if indicated in future - no deficits in swallow noted with posture therefore advised use as needed.  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  Advised pt to maintain strong cough/voice/expectoration abilities as much as able due to possible progressive neurological disease.  Thanks for this consult.     Impact on safety and function Mild aspiration risk   CHL IP TREATMENT RECOMMENDATION 05/17/2016 Treatment Recommendations No treatment recommended at this time   No flowsheet data found. CHL IP DIET RECOMMENDATION 09/27/2016 SLP Diet Recommendations Regular solids;Thin liquid Liquid Administration via Cup;Straw Medication Administration Whole meds with puree Compensations Slow rate;Small sips/bites;Other (Comment) Postural Changes Remain semi-upright after after feeds/meals (Comment);Seated upright at 90 degrees   CHL IP OTHER RECOMMENDATIONS 09/27/2016 Recommended Consults -- Oral Care Recommendations Oral care BID Other Recommendations --   CHL IP FOLLOW UP RECOMMENDATIONS 05/17/2016 Follow up  Recommendations None   No flowsheet data found.     CHL IP ORAL PHASE 09/27/2016 Oral Phase Impaired Oral - Pudding Teaspoon -- Oral - Pudding Cup -- Oral - Honey Teaspoon -- Oral - Honey Cup -- Oral - Nectar Teaspoon -- Oral - Nectar Cup Weak lingual manipulation;Reduced posterior propulsion Oral - Nectar Straw -- Oral - Thin Teaspoon -- Oral - Thin Cup Weak lingual manipulation;Reduced posterior propulsion;Lingual/palatal residue Oral - Thin Straw Weak lingual manipulation;Reduced posterior propulsion Oral - Puree Weak lingual manipulation;Reduced posterior propulsion;Lingual/palatal residue Oral - Mech Soft NT Oral - Regular Weak lingual manipulation;Reduced posterior propulsion;Lingual/palatal residue Oral - Multi-Consistency -- Oral - Pill WFL Oral Phase - Comment piecemealing, pt does not sense oral residuals but can clear with dry swallows that are cued  CHL IP PHARYNGEAL PHASE 09/27/2016 Pharyngeal Phase Impaired Pharyngeal- Pudding Teaspoon -- Pharyngeal -- Pharyngeal- Pudding Cup -- Pharyngeal -- Pharyngeal- Honey Teaspoon -- Pharyngeal -- Pharyngeal-  Honey Cup -- Pharyngeal -- Pharyngeal- Nectar Teaspoon -- Pharyngeal Material enters airway, remains ABOVE vocal cords then ejected out Pharyngeal- Nectar Cup [facility];Penetration/Aspiration during swallow Pharyngeal Material enters airway, remains ABOVE vocal cords then ejected out Pharyngeal- Nectar Straw -- Pharyngeal -- Pharyngeal- Thin Teaspoon -- Pharyngeal -- Pharyngeal- Thin Cup Penetration/Aspiration during swallow Pharyngeal Material enters airway, remains ABOVE vocal cords then ejected out Pharyngeal- Thin Straw Penetration/Aspiration during swallow Pharyngeal Material enters airway, remains ABOVE vocal cords then ejected out Pharyngeal- Puree WFL Pharyngeal -- Pharyngeal- Mechanical Soft -- Pharyngeal -- Pharyngeal- Regular WFL Pharyngeal -- Pharyngeal- Multi-consistency -- Pharyngeal -- Pharyngeal- Pill WFL Pharyngeal -- Pharyngeal Comment (No Data)   CHL IP CERVICAL ESOPHAGEAL PHASE 09/27/2016 Cervical Esophageal Phase Impaired Pudding Teaspoon -- Pudding Cup -- Honey Teaspoon -- Honey Cup -- Nectar Teaspoon -- Nectar Cup Reduced cricopharyngeal relaxation;Prominent cricopharyngeal segment Nectar Straw -- Thin Teaspoon -- Thin Cup Prominent cricopharyngeal segment Thin Straw Reduced cricopharyngeal relaxation;Prominent cricopharyngeal segment Puree Reduced cricopharyngeal relaxation;Prominent cricopharyngeal segment Mechanical Soft -- Regular Reduced cricopharyngeal relaxation;Prominent cricopharyngeal segment Multi-consistency -- Pill Reduced cricopharyngeal relaxation;Prominent cricopharyngeal segment Cervical Esophageal Comment barium tablet appeared to pause at proximal esophagus without pt awareness, clearing with further swallows, pt appears with 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 Initial encounter. EXAM: MODIFIED BARIUM SWALLOW TECHNIQUE: Different consistencies of barium were administered orally to the patient by the Speech Pathologist. Imaging of the pharynx was performed in the lateral projection. FLUOROSCOPY TIME:  Fluoroscopy Time:  1 minutes and 6 seconds Radiation Exposure Index (if provided by the fluoroscopic device): 2.2 mGy Number of Acquired Spot Images: 0 COMPARISON:  None. FINDINGS: Thin liquid- trace laryngeal penetration. Question tiny Zenker's diverticulum. Nectar thick liquid- trace laryngeal penetration. Pure- trace laryngeal penetration. Minimal pooling in the valleculae which clears with subsequent swallowing. Cracker-within normal limits Barium tablet - slightly delayed clearance from the cervical  esophagus. IMPRESSION: Trace laryngeal penetration without aspiration. Question tiny Zenker's diverticulum. Slightly delayed clearance of ingested barium tablet. Please refer to the Speech Pathologists report for complete details and recommendations. Electronically Signed   By: Genia Del M.D.   On: 09/27/2016 14:16    Labs:  CBC:  Recent Labs  07/01/16 0625 09/29/16 1128 10/01/16 0345 10/22/16 0732  WBC 6.2 8.5 12.2* 9.8  HGB 13.5 12.4 11.4* 12.1  HCT 41.7 38.0 34.3* 36.4  PLT 314 233 208 400    COAGS:  Recent Labs  07/01/16 0625  INR 1.08  APTT 32    BMP:  Recent Labs  09/29/16 1128 09/30/16 2055 10/01/16 0345 10/02/16 0353  NA 136 134* 136 136  K 4.3 3.2* 3.1* 3.8  CL 103 101 102 106  CO2 28 26 28 25   GLUCOSE 101* 148* 123* 119*  BUN 14 15 12 10   CALCIUM 8.9 8.7* 8.5* 8.5*  CREATININE 0.70 0.73 0.65 0.52  GFRNONAA >60 >60 >60 >60  GFRAA >60 >60 >60 >60    LIVER FUNCTION TESTS:  Recent Labs  03/31/16 1417 09/29/16 1128 10/01/16 0345  BILITOT 0.5 0.7 0.8  AST 19 26 18   ALT 7 9* <5*  ALKPHOS 69 68 55  PROT 6.8 6.4* 5.2*  ALBUMIN 4.1 3.6 2.9*    TUMOR MARKERS: No results  for input(s): AFPTM, CEA, CA199, CHROMGRNA in the last 8760 hours.  Assessment and Plan:  Back pain Recent falls at home Now in [facility] Pain is worsening and MRI shows acute Thoracic 10 and 11 fractures Now scheduled for vertebroplasty/kyphoplasty Risks and Benefits discussed with the patient including, but not limited to education regarding the natural healing process of compression fractures without intervention, bleeding, infection, cement migration which may cause spinal cord damage, paralysis, pulmonary embolism or even death. All of the patient's questions were answered, patient is agreeable to proceed. Consent signed and in chart.  Thank you for this interesting consult.  I greatly enjoyed meeting [employer] and look forward to participating in their care.  A  copy of this report was sent to the requesting provider on this date.  Electronically Signed: Lysander Calixte A 10/22/2016, 7:46 AM   I spent a total of  30 Minutes   in face to face in clinical consultation, greater than 50% of which was counseling/coordinating care for T 10/11 VP/KP

## 2016-10-22 NOTE — Procedures (Signed)
S/P T 10 and T 11 VP

## 2016-10-22 NOTE — Sedation Documentation (Signed)
Attempted to call report. No nurse available. Will call back

## 2016-10-25 ENCOUNTER — Encounter (HOSPITAL_COMMUNITY): Payer: Self-pay | Admitting: Interventional Radiology

## 2016-10-25 ENCOUNTER — Telehealth (HOSPITAL_COMMUNITY): Payer: Self-pay

## 2016-10-25 NOTE — Telephone Encounter (Signed)
Kelly from [facility] stated that pt was doing fine with all post op instructions. She has not complained of any pain. Will call to schedule f/u if pt starts to have issues. AW

## 2016-10-27 IMAGING — MR MR CERVICAL SPINE W/O CM
4 of 5 series · 20 of 48 positions shown · non-contrast
Comparison: Brain and C-spine MR 05/09/2015

CLINICAL DATA: Frequent falls.  Memory loss.

EXAM:
MRI HEAD WITHOUT CONTRAST
MRI CERVICAL SPINE WITHOUT CONTRAST
TECHNIQUE: Multiplanar, multiecho pulse sequences of the brain and surrounding
structures, and cervical spine, to include the craniocervical
junction and cervicothoracic junction, were obtained without
intravenous contrast.

[Series 2: T2 · sagittal · 3.0mm · 0.41mm/px · 6 of 13 slices shown (1 of 3)]
[im 1/13]
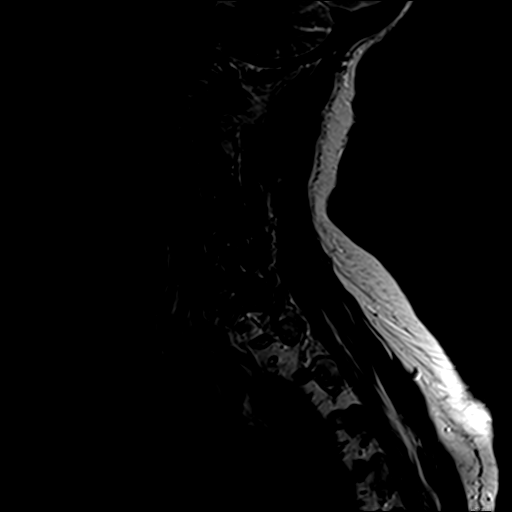
[im 3/13]
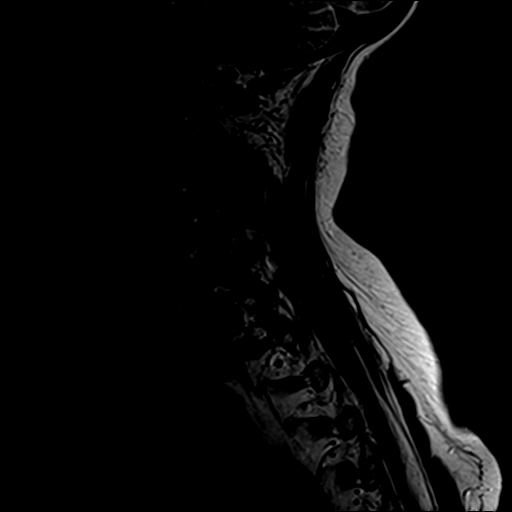
[im 5/13]
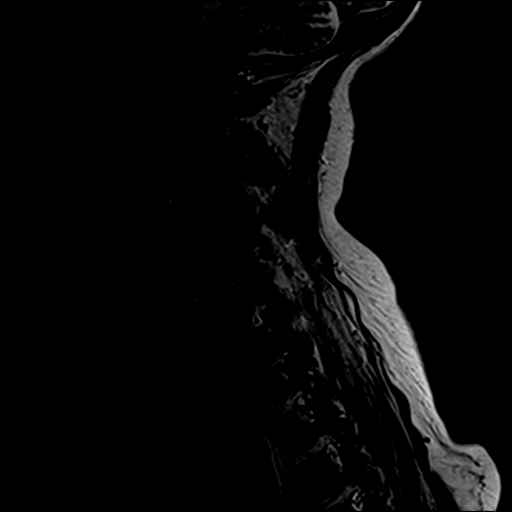
[im 8/13]
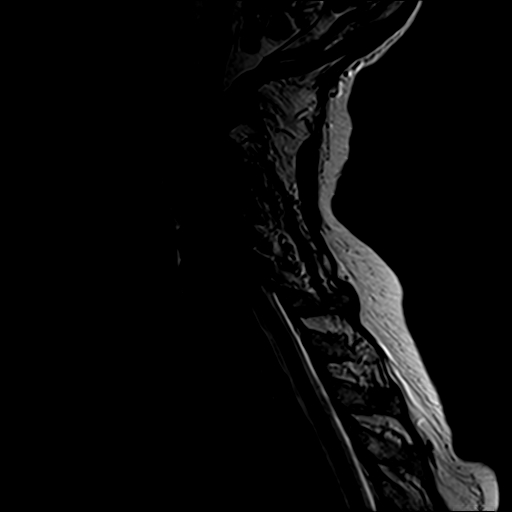
[im 10/13]
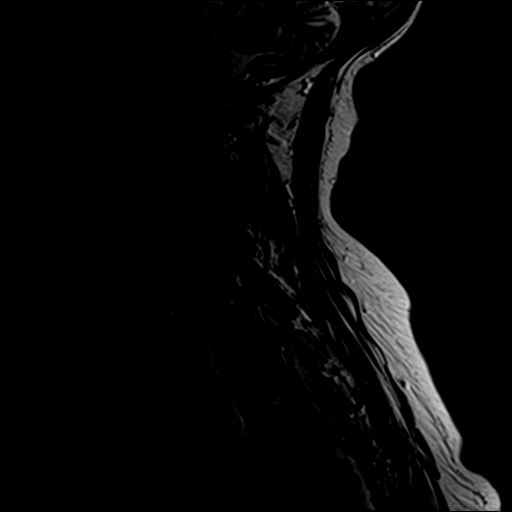
[im 13/13]
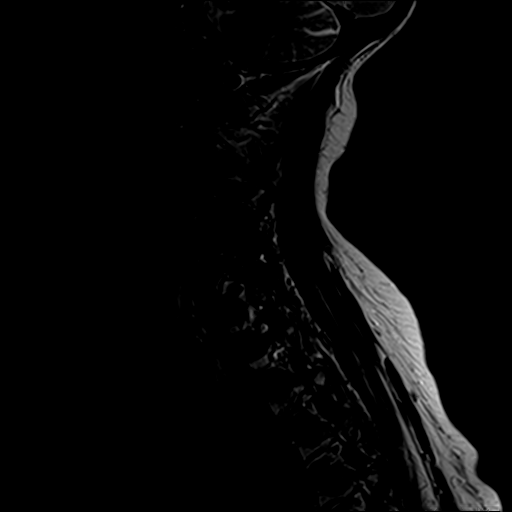

[Series 3: T1 · sagittal · 3.0mm · 0.41mm/px · 3 of 13 slices shown]
[im 3/13]
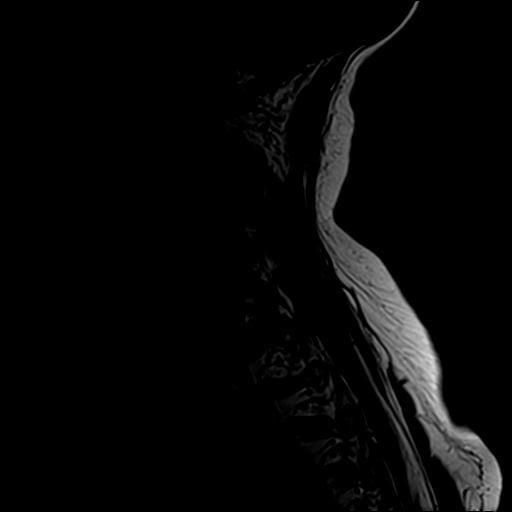
[im 7/13]
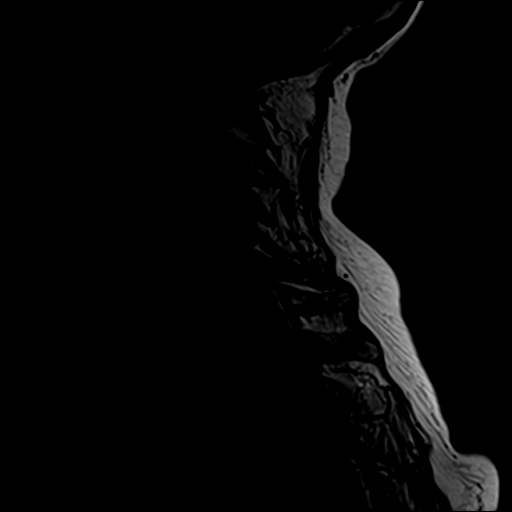
[im 11/13]
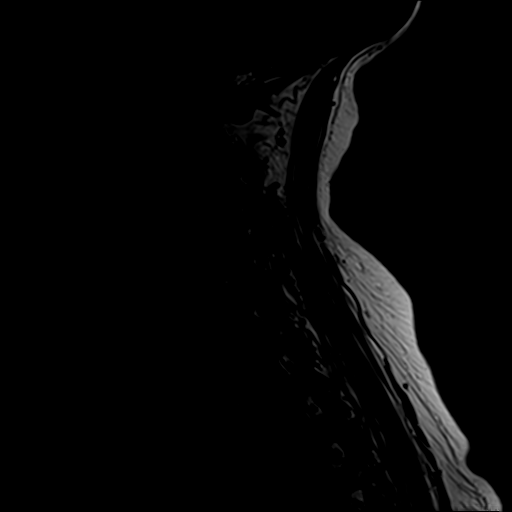

[Series 5: T2 · axial · 3.0mm · 0.39mm/px · z∈[-37,+51]mm · 8 of 26 slices shown (2 of 3)]
[im 1/26]
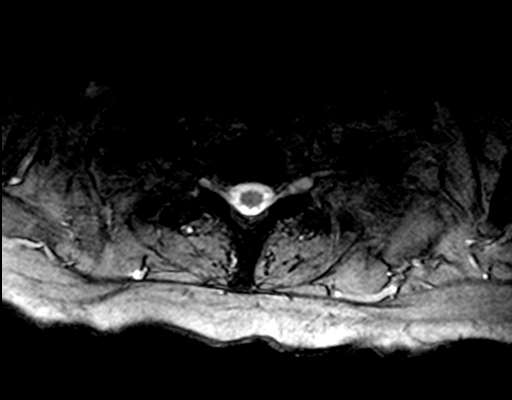
[im 4/26]
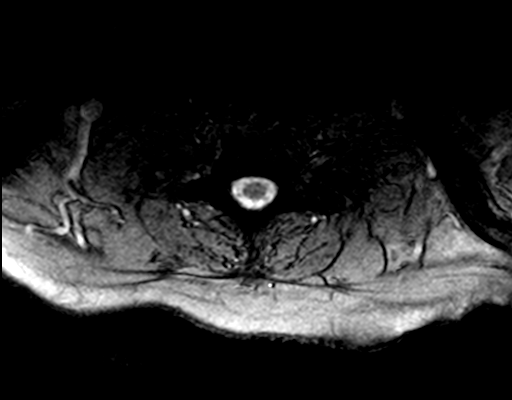
[im 8/26]
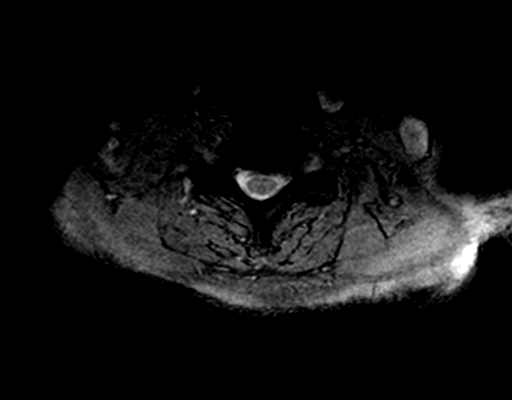
[im 12/26]
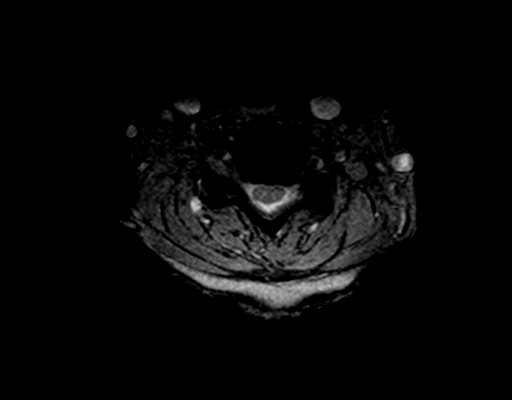
[im 14/26]
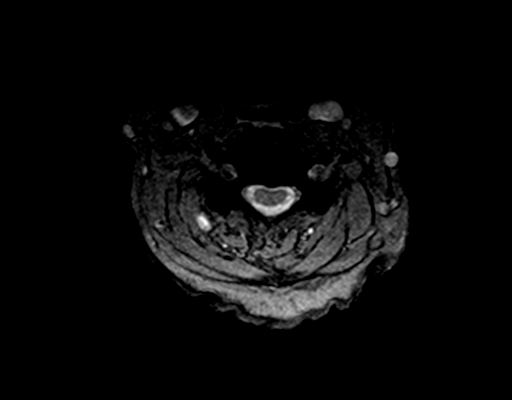
[im 18/26]
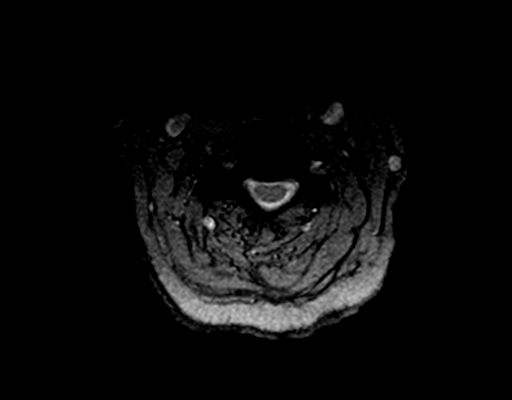
[im 22/26]
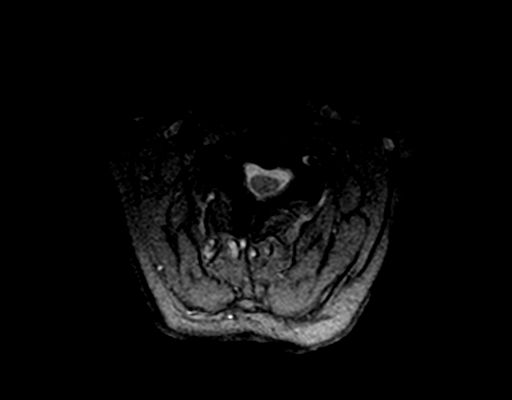
[im 26/26]
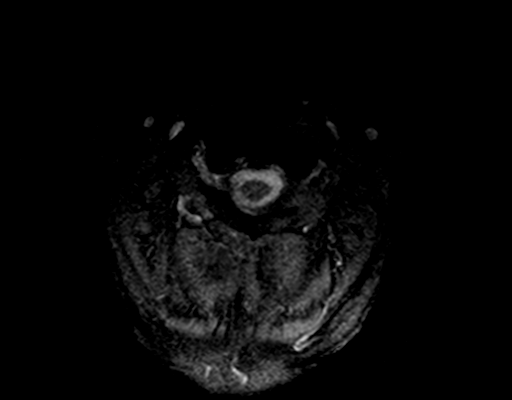

[Series 6: T2 · axial · 3.0mm · 0.39mm/px · z∈[-27,+36]mm · 3 of 26 slices shown (3 of 3)]
[im 4/26]
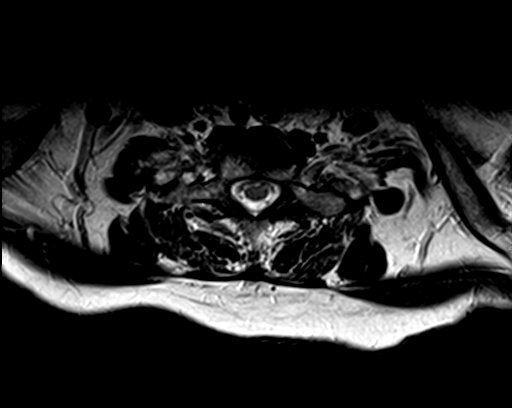
[im 14/26]
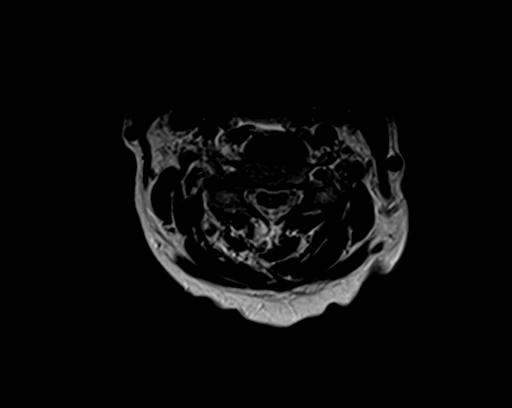
[im 22/26]
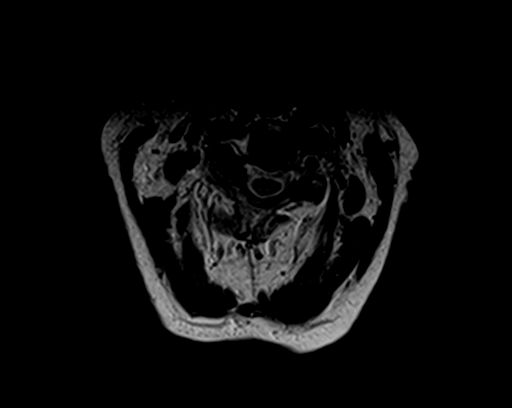

[20 of 48 positions shown; findings below may reference images not displayed]

FINDINGS: MRI HEAD FINDINGS

Brain: No acute infarct or intraparenchymal hemorrhage. The midline
structures are normal. Multifocal hyperintense T2 weighted signal in
the periventricular and subcortical white matter and within the pons
is again seen, consistent with chronic microvascular ischemia. No
mass lesion or midline shift. No hydrocephalus or extra-axial fluid
collection.

Vascular: Major intracranial flow voids are preserved. No evidence
of chronic microhemorrhage or amyloid angiopathy.

Skull and upper cervical spine: The visualized skull base,
calvarium, upper cervical spine and extracranial soft tissues are
normal.

Sinuses/Orbits: No fluid levels or advanced mucosal thickening. No
mastoid effusion. Normal orbits.

MRI CERVICAL SPINE FINDINGS

Alignment: There is reversal of the normal cervical lordosis
centered at C4-5 with grade 1 retrolisthesis at C5-6 and C6-7,
unchanged.

Vertebrae: Multifocal degenerative endplate signal change. No focal
marrow lesion. No acute compression fracture. No facet edema.

Cord: Normal caliber and signal.  No cervical syrinx.

Posterior Fossa, vertebral arteries, paraspinal tissues: Visualized
posterior fossa is normal. Vertebral artery flow voids are
preserved. Normal visualized paraspinal soft tissues.

Disc levels:

C1-C2: Moderate degenerative change.

C2-C3: Normal disc. Mild right facet hypertrophy. Mild uncovertebral
spurring. No spinal canal stenosis. Mild right neuroforaminal
stenosis.

C3-C4: Narrowing of the intervertebral disc space with posterior
disc osteophyte complex and large anterior osteophyte.
Right-greater-than-left facet hypertrophy. Mild spinal canal
stenosis. Severe bilateral neuroforaminal stenosis.

C4-C5: Small disc bulge and bilateral uncovertebral hypertrophy with
right greater than left facet hypertrophy. Mild spinal canal
stenosis. Mild bilateral neuroforaminal stenosis.

C5-C6: Left subarticular disc osteophyte complex effacing the
ventral thecal sac and impressing upon the left ventral aspect of
the spinal cord. Mild central spinal canal stenosis. Mild bilateral
moderate disc osteophyte complex. Neuroforaminal stenosis.

C6-C7: Normal disc space and facet joints. Severe left and mild
right spinal canal stenosis. No neuroforaminal stenosis.

C7-T1: Normal disc space and facet joints. No spinal canal stenosis.
No neuroforaminal stenosis.
IMPRESSION: 1. No acute intracranial abnormality.
2. Findings of chronic microvascular ischemia.
3. Unchanged multilevel cervical degenerative disc disease with mild
narrowing of the spinal canal at C3-4, C4-5 and C5-6 without cord
signal change.
4. Severe bilateral C3-4 and left C6-7 neural foraminal stenosis,
also unchanged.

## 2016-10-31 DIAGNOSIS — R2689 Other abnormalities of gait and mobility: Secondary | ICD-10-CM | POA: Diagnosis not present

## 2016-10-31 DIAGNOSIS — M545 Low back pain: Secondary | ICD-10-CM | POA: Diagnosis not present

## 2016-10-31 DIAGNOSIS — M810 Age-related osteoporosis without current pathological fracture: Secondary | ICD-10-CM | POA: Diagnosis not present

## 2016-10-31 DIAGNOSIS — F418 Other specified anxiety disorders: Secondary | ICD-10-CM | POA: Diagnosis not present

## 2016-11-04 ENCOUNTER — Ambulatory Visit: Payer: Medicare Other | Admitting: Neurology

## 2016-11-05 NOTE — Progress Notes (Signed)
Tara Neal was seen today in the movement disorders clinic for neurologic consultation at the request of [name], MD.  This patient is accompanied in the office by her spouse who supplements the history.  The records that were made available to me were reviewed  The consultation is for the evaluation of gait change. Pt states that sx's started 3 weeks ago and states that she had no problems at all before that but husband points out that over a year ago, she had fx of the wrist from a fall.  He thinks that this has been going on for much longer than 3 weeks (estimates slowly for last few years).  Her husband does state that she went to get up from the chair 3 weeks and she felt off balance.  She was "dizzy" but denies vertigo.  Each time she got up she got that feeling; she initially stated that she never had it lying down or seated but then her husband said that she did and she agreed that it was that way but isn't any more.  .The patient saw Dr. [name] on 04/10/15 and was c/o dizziness.  She was dx with OH and her lasix was d/c.  She states that she thinks it helped somewhat but she still gets some dizziness when she stands up but it seemed to help the sitting/laying.  She denies n/v.  No palpitations.  No weakness or paresthesias that lateralize.  She denies speech/swallowing problems but her husband interjects that she "gets choked" sometimes when eating.  Her husband states that the patient believes that she does not walk well because she cannot see well, but she just went to the eye doctor a few weeks ago and her vision was not changed and was unremarkable.  She has an MRI brain pending.  08/11/16 update:  The patient follows up today, accompanied by her husband/daughter who supplements the history.  I have reviewed numerous records.  I have not seen her in about a year.  At last visit, she had evidence of peripheral neuropathy on examination and subsequently had an EMG on 05/06/2015 that  demonstrated severe peripheral neuropathy.  She had a workup for reversible causes.  Her hemoglobin A1c was 5.7, B12 444.  No monoclonal proteins in the urine.  There was slight restricted mobility on her SPEP immunofixation in IgG and lambda regions.  Her folate was normal.  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  She and MRI of the brain that then started white matter disease.  MRA of the brain was negative.  Her MRI of the cervical spine demonstrated diffuse degenerative changes.  Neurosurgical consultation was recommended, She went and I just received records from Dr [name] yesterday.  No surgery was recommended.  The patient reports that she did fairly well over the years until she fell in July, 2017.  She had a repeat MRI that demonstrated an acute/subacute endplate fracture at the L5 region and a chronic fracture at the L3 region.  She subsequently had kyphoplasty by Dr. [name] in August, 2017.  This helped the acute back pain, but she continues to have pain radiating down the left leg.  States that the pain is crampy in nature and will draw the foot back.  Daughter states the bigger concern is  memory change and a problem with her L hand/arm.  Daughter states first episode was in June.  Her husband states that she had trouble moving the L arm.  She could move it but wouldn't and kept it flexed at the chest.  They also noted L facial droop and that lasted 3 days.  She was admitted to the hospital but told that she had a UTI.  No brain neuroimaging was done.  States that it is overall better but she still doesn't use the L arm like she used to.  This is not documented in hospital records but rather L hip pain was noted.  Husband states that several weeks ago, she fell and hit her head and had a "thick tongue" for  several days.  Husband states that she would want to know what she wanted to say.  Daughter reports that sometimes when she has an "off" day, she will stare off and seem to have speech issues.  Daughter concerned about PD or MSA (had a family member recently dx with that)  09/20/16 update:  The patient follows up today, accompanied by her husband/daughter who supplements the history.   She had an EEG and a 48-hour ambulatory EEG since last visit.  Both of these demonstrated diffuse slowing of electrocerebral activity, but no epileptiform activity.  In regards to spells of transient weakness and dysarthria, she states that she has had no further episodes.  MRI of the brain demonstrated only chronic small vessel disease.  She had a repeat MRI of the cervical spine that demonstrated multilevel degenerative changes, central canal stenosis and severe bilateral C3-4 and left C6-7 neural foraminal stenosis. Reviewed records from Dr. [name].   He didn't recommend surgical intervention.  He recommended physical therapy (which she had just stopped) and gave her a prescription for hydrocodone.    EMG done on 09/14/2016 and stated only moderate left carpal tunnel syndrome.  No evidence of cervical radiculopathy on EMG.  No falls but husband walks next to her with a gait belt and walker.  Memory continues to be an issue.  Biggest issue in AM and in late evening.  Sleeping all of the way through the night but husband gives her "a pain pill and gabapentin" and she goes to sleep.  Very repetitive.  No hallucinations.  Sleeps a lot during the day and sleeps more than she is awake.  Is not having diplopia.  Is having dysphagia, with solids and liquids.  11/08/16 update:  The patient follows up today, accompanied by her daughter who supplements the history. Her husband has been in the hospital as well so he is not here today.   I started her on low dose levodopa last visit.  I did review records since last visit.  SNF is giving it  to her at 9am/1pm/9pm and family is noting no difference but no side effects either.  Noting curling of toes.  She was in the hospital from 09/29/2016 to 10/05/2016.  She presented to the hospital with loss of bowel control.  She was negative for C. difficile.  She was diagnosed and treated with Escherichia coli enteritis.  Acute compression fractures at T10 and T11 were noted along with severe spinal and neuroforaminal stenosis at several levels in the lumbar region.  Vertebroplasty or kyphoplasty was recommended as an outpatient.  She had that completed on 10/22/2016.  Daughter states that she is still having some pain and pain medication was increased a little.  She has been at  [facility] for rehab since hospital d/c.  Confusion has been increased since out of environment.  Plan is to come home on Thursday.  Daughter lives next door and works from home and needs FMLA filled out.  Daughter hoping to arrange home health at d/c from SNF.  She did have a modified barium swallow on 09/27/2016.  This demonstrated mild oral, mild pharyngeal and mild esophageal phase dysphagia.  Regular diet with thin liquids was recommended.  Started on VPA 125 mg bid per daughter because she was so anxious and nervous and it did help.  She was tapping her hands on everything and pacing to the nursing desk and depakote helped with this.  Effexor dosage was lowered.      ALLERGIES:   Allergies  Allergen Reactions  . Codeine Nausea Only    Can take low doses  . Sulfonamide Derivatives Nausea Only    REACTION: Nausea  . Penicillins Rash    .Marland KitchenHas patient had a PCN reaction causing immediate rash, facial/tongue/throat swelling, SOB or lightheadedness with hypotension: No Has patient had a PCN reaction causing severe rash involving mucus membranes or skin necrosis: No Has patient had a PCN reaction that required hospitalization No Has patient had a PCN reaction occurring within the last 10 years: No If all of the above answers are  "NO", then may proceed with Cephalosporin use.   Marland Kitchen Percocet [Oxycodone-Acetaminophen] Itching    CURRENT MEDICATIONS:  Outpatient Encounter Prescriptions as of 11/08/2016  Medication Sig  . acetaminophen (TYLENOL) 325 MG tablet Take 650 mg by mouth at bedtime.  . Ascorbic Acid (VITAMIN C) 1000 MG tablet Take 1,000 mg by mouth daily.  Marland Kitchen atenolol (TENORMIN) 50 MG tablet TAKE 1 TABLET EVERY DAY (Patient taking differently: TAKE 50 mg TABLET by mouth once daily)  . benazepril (LOTENSIN) 40 MG tablet TAKE 1 TABLET EVERY DAY (Patient taking differently: No sig reported)  . calcium carbonate (OS-CAL) 600 MG TABS Take 600 mg by mouth 2 (two) times daily with a meal.  . carbidopa-levodopa (SINEMET IR) 25-100 MG tablet Take 1 tablet by mouth 3 (three) times daily.  . Cholecalciferol (VITAMIN D3) 1000 UNITS CAPS Take 1 capsule by mouth daily.  Marland Kitchen denosumab (PROLIA) 60 MG/ML SOLN injection Inject 60 mg into the skin every 6 (six) months. Administer in upper arm, thigh, or abdomen  . divalproex (DEPAKOTE SPRINKLE) 125 MG capsule Take 125 mg by mouth 2 (two) times daily.  . folic acid (FOLVITE) 1 MG tablet Take 1 mg by mouth daily.  Marland Kitchen gabapentin (NEURONTIN) 300 MG capsule Take 300 mg by mouth 3 (three) times daily.  Marland Kitchen guaiFENesin (MUCINEX) 600 MG 12 hr tablet Take by mouth 2 (two) times daily.  Marland Kitchen HYDROcodone-acetaminophen (NORCO/VICODIN) 5-325 MG tablet Take 1 tablet by mouth every 8 (eight) hours as needed for moderate pain.  Marland Kitchen latanoprost (XALATAN) 0.005 % ophthalmic solution Place 1 drop into both eyes at bedtime.   . magnesium hydroxide (MILK OF MAGNESIA) 400 MG/5ML suspension Take 30 mLs by mouth daily as needed for mild constipation.  . Melatonin 3 MG TABS Take 6 mg by mouth at bedtime.   . meloxicam (MOBIC) 7.5 MG tablet Take 15 mg by mouth daily.   . methotrexate 250 MG/10ML injection Inject 1 mL (25 mg total) into the skin once a week.  . Multiple Vitamin (MULTIVITAMIN) tablet Take 1 tablet by  mouth daily.  . Omega-3 Fatty Acids (FISH OIL) 1200 MG CAPS Take 2 capsules by mouth daily.   Marland Kitchen  omeprazole (PRILOSEC) 20 MG capsule TAKE 2 CAPSULES EVERY DAY (Patient taking differently: Take 2 capsules by mouth daily at bedtime.)  . polyethylene glycol (MIRALAX / GLYCOLAX) packet Take 17 g by mouth daily.  . sennosides-docusate sodium (SENOKOT-S) 8.6-50 MG tablet Take 1 tablet by mouth daily.  . simvastatin (ZOCOR) 40 MG tablet TAKE 1 TABLET EVERY DAY  AT  6PM (Patient taking differently: No sig reported)  . timolol (TIMOPTIC) 0.5 % ophthalmic solution Place 1 drop into both eyes 2 (two) times daily.   Marland Kitchen venlafaxine XR (EFFEXOR-XR) 150 MG 24 hr capsule TAKE 1 CAPSULE EVERY DAY WITH BREAKFAST (Patient taking differently: No sig reported)  . [DISCONTINUED] feeding supplement, ENSURE ENLIVE, (ENSURE ENLIVE) LIQD Take 237 mLs by mouth 2 (two) times daily between meals.  . AMBULATORY NON FORMULARY MEDICATION 1 Device by Does not apply route daily. Lift Chair   No facility-administered encounter medications on file as of 11/08/2016.     PAST MEDICAL HISTORY:   Past Medical History:  Diagnosis Date  . Allergy   . Anemia    hx of   . Anxiety   . Chronic insomnia 04/22/2012  . COPD (chronic obstructive pulmonary disease) ([place])   . Diverticulosis of colon   . GERD (gastroesophageal reflux disease)   . Glaucoma   . Hormone replacement therapy (postmenopausal)   . Hyperlipidemia   . Hypertension   . Low back pain   . Migraine headache    denies  . Mild obesity   . Osteoarthritis   . Osteopenia   . Osteoporosis   . Pneumonia    hx of 5 years ago   . Scoliosis   . Shortness of breath    with exertion   . Spontaneous pneumothorax    1968  . Umbilical hernia [id number]    PAST SURGICAL HISTORY:   Past Surgical History:  Procedure Laterality Date  . EXCISION/RELEASE BURSA HIP  04/26/2012   Procedure: EXCISION/RELEASE BURSA HIP;  Surgeon: Gearlean Alf, MD;  Location: [facility];   Service: Orthopedics;  Laterality: Right;  . EYE SURGERY     - bil cataract surg and Retina surgery to left eye  . HERNIA REPAIR  [id number]   umbilical hernia  . HIP ADDUCTOR TENOTOMY    . IR GENERIC HISTORICAL  06/17/2016   IR RADIOLOGIST EVAL & MGMT 06/17/2016 [facility]  . IR GENERIC HISTORICAL  07/01/2016   IR VERTEBROPLASTY LUMBAR BX INC UNI/BIL INC/INJECT/IMAGING 07/01/2016 Luanne Bras, MD [facility]  . IR GENERIC HISTORICAL  07/16/2016   IR RADIOLOGIST EVAL & MGMT 07/16/2016 [facility]  . IR GENERIC HISTORICAL  10/22/2016   IR VERTEBROPLASTY CERV/THOR BX INC UNI/BIL INC/INJECT/IMAGING 10/22/2016 Luanne Bras, MD [facility]  . IR GENERIC HISTORICAL  10/22/2016   IR VERTEBROPLASTY EA ADDL (T&LS) BX INC UNI/BIL INC INJECT/IMAGING 10/22/2016 Luanne Bras, MD [facility]  . LUMBAR DISC SURGERY      x 2  . OTHER SURGICAL HISTORY     benign growth removed from right under ear at age 58   . SHOULDER ARTHROSCOPY     left shoulder  . TONSILLECTOMY    . TONSILLECTOMY    . WRIST FRACTURE SURGERY     plate in left wrist/ fractured 2 times    SOCIAL HISTORY:   Social History   Social History  . Marital status: Married    Spouse name: N/A  . Number of children: N/A  . Years of education: N/A   Occupational  History  . Not on file.   Social History Main Topics  . Smoking status: Former Smoker    Years: 20.00    Types: Cigarettes    Quit date: 11/29/1981  . Smokeless tobacco: Never Used  . Alcohol use Yes     Comment: one glass of wine every 3 months  . Drug use: No  . Sexual activity: Not on file   Other Topics Concern  . 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 Mother Deceased   heart disease, DM  . Father Deceased   killed in war  . Son Alive   back problems  . Daughter Alive   breast cancer    ROS:  A complete 10 system review of systems was  obtained and was unremarkable apart from what is mentioned above.  PHYSICAL EXAMINATION:    VITALS:   Vitals:   11/08/16 0824  BP: 100/70  Pulse: 73    GEN:  The patient appears stated age and is in NAD. HEENT:  Normocephalic, atraumatic.  The mucous membranes are moist. The superficial temporal arteries are without ropiness or tenderness. CV:  RRR Lungs:  CTAB Neck/HEME:  There are no carotid bruits bilaterally.  Neurological examination:  Orientation:  Montreal Cognitive Assessment  08/11/2016 04/24/2015  Visuospatial/ Executive (0/5) 1 4  Naming (0/3) 1 2  Attention: Read list of digits (0/2) 2 1  Attention: Read list of letters (0/1) 1 1  Attention: Serial 7 subtraction starting at 100 (0/3) 0 0  Language: Repeat phrase (0/2) 2 2  Language : Fluency (0/1) 0 0  Abstraction (0/2) 2 2  Delayed Recall (0/5) 3 4  Orientation (0/6) 5 6  Total 17 22  Adjusted Score (based on education) 18 23     Cranial nerves: There is good facial symmetry with the exception of slight decreased nasolabial fold on the right, resulting in minor facial droop around the right lip.  When she activates these muscles with smile, however, there is good facial symmetry.  The patient has decreased upgaze (complete restriction) and more minor decreased downgaze.    She has difficulty participating with formal confrontational testing. The speech is fluent and clear. Soft palate rises symmetrically and there is no tongue deviation. Hearing is intact to conversational tone. Sensation: Sensation is intact to light touch throughout. Motor: Strength is 5/5 in the bilateral upper and lower extremities.   Shoulder shrug is equal and symmetric.  There is no pronator drift.   Deep tendon reflexes: Deep tendon reflexes are 3/4 at the bilateral biceps, triceps, brachioradialis, patella and 1/4 at the bilateral achilles. Plantar responses are downgoing bilaterally.  There is no Hoffmans or Tromner's response  bilaterally.  Movement examination: Tone: There is normal tone in the bilateral upper extremities.  The tone in the lower extremities is normal.  Abnormal movements: None Coordination:  There is no significant decremation with RAM's, with any formal of rapid alternating movements, including alternating supination and pronation of the forearm, hand opening and closing, finger taps, heel taps and toe taps. Gait and Station: The patient requires 1 person assist out of the wheelchair.  She cannot walk even with walker (falls back in chair).  Doesn't have gait belt on. 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    Lab Results  Component Value Date   [id number] 04/24/2015  Lab Results  Component Value Date   TSH 1.34 03/31/2016   Lab Results  Component Value Date   HGBA1C 5.7 03/31/2016     Chemistry      Component Value Date/Time   NA 135 10/22/2016 0732   K 4.4 10/22/2016 0732   CL 102 10/22/2016 0732   CO2 24 10/22/2016 0732   BUN 14 10/22/2016 0732   CREATININE 0.67 10/22/2016 0732      Component Value Date/Time   CALCIUM 9.3 10/22/2016 0732   ALKPHOS 55 10/01/2016 0345   AST 18 10/01/2016 0345   ALT <5 (L) 10/01/2016 0345   BILITOT 0.8 10/01/2016 0345        ASSESSMENT/PLAN:  1. 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  Now has vertical gaze palsy, ataxia, dysphagia  -not sure if levodopa not working because too low dose, because of timing medication is being given by SNF (after meals and spread far apart).  Try to slowing increase to 2 po tid.    R foot becoming dystonic and not sure if because of levodopa or because of too little levodopa.  She isn't walking much anymore but toes cramping.  -EMG demonstrated severe peripheral neuropathy in June, 2016.  Workup was essentially unremarkable, with the exception of a slight restricted in the IgG and lambda regions.  We will repeat her SPEP with immunofixation next visit. When I do this, will likely repeat  B12 as well  -She did have a modified barium swallow on 09/27/2016.  This demonstrated mild oral, mild pharyngeal and mild esophageal phase dysphagia.  Regular diet with thin liquids was recommended.  -RX chair lift  -evaluate for need for hoyer lift  -talked to daughter about living situation.  Filled out daughters FMLA form  2.  Significant hyperreflexia  -multilevel degenerative changes on MRI cervical spine.  Saw Dr. [name] and felt non surgical in nature.    3.  Spells of transient lateralizing weakness and dysarthria  -EEG with evidence of slowing of electrocerebral activity but no evidence of epileptiform activity.  -Has had no further episodes and decided to hold off on echocardiogram or further testing  4.  Memory change  -pts MoCA has dropped over the year and suspect has developed underlying dementia.  Husband is describing sundowning symptoms.  Worse since being in hospital and SNF.  Will monitor.  Other changes in meds being made today.  5.  Low back pain with lumbar radiculopathy and evidence of neural foraminal stenosis and central canal stenosis on the MRI.  -Her back pain is better after lumbar kyphoplasty, but continues to have evidence of lumbosacral radiculopathy on the left.  Talked about neurosurgery referral.  Had one previously and felt nonsurgical so wishes to hold.  --she did have acute compression fx at T10/11 and had thoracic kyphoplasty in 10/22/2016  6. Follow up is anticipated in the next few months, sooner should new neurologic issues arise.  Much greater than 50% of this visit was spent in counseling and coordinating care.  Total face to face time:  30 min

## 2016-11-08 ENCOUNTER — Encounter: Payer: Self-pay | Admitting: Neurology

## 2016-11-08 ENCOUNTER — Other Ambulatory Visit: Payer: Medicare Other

## 2016-11-08 ENCOUNTER — Ambulatory Visit (INDEPENDENT_AMBULATORY_CARE_PROVIDER_SITE_OTHER): Payer: Medicare Other | Admitting: Neurology

## 2016-11-08 VITALS — BP 100/70 | HR 73

## 2016-11-08 DIAGNOSIS — F0390 Unspecified dementia without behavioral disturbance: Secondary | ICD-10-CM

## 2016-11-08 DIAGNOSIS — G609 Hereditary and idiopathic neuropathy, unspecified: Secondary | ICD-10-CM

## 2016-11-08 DIAGNOSIS — E538 Deficiency of other specified B group vitamins: Secondary | ICD-10-CM

## 2016-11-08 DIAGNOSIS — D472 Monoclonal gammopathy: Secondary | ICD-10-CM | POA: Diagnosis not present

## 2016-11-08 DIAGNOSIS — Z029 Encounter for administrative examinations, unspecified: Secondary | ICD-10-CM

## 2016-11-08 DIAGNOSIS — G231 Progressive supranuclear ophthalmoplegia [Steele-Richardson-Olszewski]: Secondary | ICD-10-CM

## 2016-11-08 LAB — VITAMIN B12: Vitamin B-12: 377 pg/mL (ref 200–1100)

## 2016-11-08 MED ORDER — AMBULATORY NON FORMULARY MEDICATION: 1.0000 | Freq: Every day | 0 refills | Status: DC

## 2016-11-08 NOTE — Patient Instructions (Signed)
1. Note written to [facility] to see if hoyer lift will be helpful when you go home.  2. RX written for lift chair.  3. Increase Levodopa to 1.5 tablets at 7 am, 11 am, 4 pm She will need to increase this again after one week to 2 tablets at 7 am, 11 am, 4 pm

## 2016-11-09 ENCOUNTER — Telehealth: Payer: Self-pay | Admitting: Neurology

## 2016-11-09 NOTE — Telephone Encounter (Signed)
Patient daughter called and wanted to give you a fax numbr for the [facility] papers [phone]

## 2016-11-09 NOTE — Telephone Encounter (Signed)
Paperwork faxed to number provided.

## 2016-11-10 LAB — PROTEIN ELECTROPHORESIS, SERUM
ALBUMIN ELP: 3.6 g/dL — AB (ref 3.8–4.8)
Alpha-1-Globulin: 0.4 g/dL — ABNORMAL HIGH (ref 0.2–0.3)
Alpha-2-Globulin: 0.7 g/dL (ref 0.5–0.9)
BETA 2: 0.3 g/dL (ref 0.2–0.5)
Beta Globulin: 0.4 g/dL (ref 0.4–0.6)
GAMMA GLOBULIN: 0.9 g/dL (ref 0.8–1.7)
TOTAL PROTEIN, SERUM ELECTROPHOR: 6.2 g/dL (ref 6.1–8.1)

## 2016-11-11 ENCOUNTER — Telehealth: Payer: Self-pay | Admitting: Neurology

## 2016-11-11 ENCOUNTER — Ambulatory Visit (INDEPENDENT_AMBULATORY_CARE_PROVIDER_SITE_OTHER): Payer: Medicare Other | Admitting: Podiatry

## 2016-11-11 DIAGNOSIS — M79605 Pain in left leg: Secondary | ICD-10-CM

## 2016-11-11 DIAGNOSIS — B351 Tinea unguium: Secondary | ICD-10-CM

## 2016-11-11 DIAGNOSIS — D472 Monoclonal gammopathy: Secondary | ICD-10-CM

## 2016-11-11 DIAGNOSIS — M79604 Pain in right leg: Secondary | ICD-10-CM

## 2016-11-11 LAB — IMMUNOFIXATION ELECTROPHORESIS
IGG (IMMUNOGLOBIN G), SERUM: 957 mg/dL (ref 694–1618)
IgA: 197 mg/dL (ref 81–463)
IgM, Serum: 43 mg/dL — ABNORMAL LOW (ref 48–271)

## 2016-11-11 NOTE — Telephone Encounter (Signed)
Left message on machine for patient's daughter [name] to call back.

## 2016-11-11 NOTE — Telephone Encounter (Signed)
Spoke with patient's daughter and made her aware. Referral placed to Dr. Alvy Bimler.

## 2016-11-11 NOTE — Telephone Encounter (Signed)
-----   Message from [place], DO sent at 11/11/2016  9:53 AM EST ----- I don't think that this IgG lamba is anything but make consult with Dr. Alvy Bimler if patient willing to make sure as has had multiple fx's.  Also have her take oral b12 supplement.  Would like to see her level over 400

## 2016-11-12 NOTE — Progress Notes (Signed)
Subjective:     Patient ID: Tara Neal, female   DOB: 11-Feb-1941, 75 y.o.   MRN: [id number]  HPI patient presents stating she has elongated thickened yellow brittle nailbeds 1-5 both feet that are painful and she cannot cut   Review of Systems     Objective:   Physical Exam Neurovascular status was intact but I did note incurvated nailbeds 1-5 both feet that do get yellow that are brittle and thickened and they are painful when pressed in the corners    Assessment:     Mycotic nail infection with pain 1-5 both feet    Plan:     Debride painful nailbeds 1-5 both feet with no iatrogenic bleeding noted

## 2016-11-15 ENCOUNTER — Telehealth: Payer: Self-pay | Admitting: Hematology and Oncology

## 2016-11-15 ENCOUNTER — Encounter: Payer: Self-pay | Admitting: Hematology and Oncology

## 2016-11-15 NOTE — Telephone Encounter (Signed)
Pt's daughter confirmed appt, verified demo and insurance, mailed pt letter, in [employer] referring provider appt date/time.

## 2016-11-17 DIAGNOSIS — Z961 Presence of intraocular lens: Secondary | ICD-10-CM | POA: Diagnosis not present

## 2016-11-17 DIAGNOSIS — N312 Flaccid neuropathic bladder, not elsewhere classified: Secondary | ICD-10-CM | POA: Diagnosis not present

## 2016-11-17 DIAGNOSIS — H401123 Primary open-angle glaucoma, left eye, severe stage: Secondary | ICD-10-CM | POA: Diagnosis not present

## 2016-11-17 DIAGNOSIS — H401113 Primary open-angle glaucoma, right eye, severe stage: Secondary | ICD-10-CM | POA: Diagnosis not present

## 2016-11-17 DIAGNOSIS — H04123 Dry eye syndrome of bilateral lacrimal glands: Secondary | ICD-10-CM | POA: Diagnosis not present

## 2016-11-18 ENCOUNTER — Encounter: Payer: Self-pay | Admitting: Internal Medicine

## 2016-11-18 ENCOUNTER — Ambulatory Visit (INDEPENDENT_AMBULATORY_CARE_PROVIDER_SITE_OTHER): Payer: Medicare Other | Admitting: Internal Medicine

## 2016-11-18 VITALS — BP 128/78 | HR 53 | Resp 20 | Wt 117.0 lb

## 2016-11-18 DIAGNOSIS — E0822 Diabetes mellitus due to underlying condition with diabetic chronic kidney disease: Secondary | ICD-10-CM | POA: Diagnosis not present

## 2016-11-18 DIAGNOSIS — F329 Major depressive disorder, single episode, unspecified: Secondary | ICD-10-CM

## 2016-11-18 DIAGNOSIS — R269 Unspecified abnormalities of gait and mobility: Secondary | ICD-10-CM

## 2016-11-18 DIAGNOSIS — N181 Chronic kidney disease, stage 1: Secondary | ICD-10-CM | POA: Diagnosis not present

## 2016-11-18 DIAGNOSIS — F0390 Unspecified dementia without behavioral disturbance: Secondary | ICD-10-CM | POA: Diagnosis not present

## 2016-11-18 DIAGNOSIS — F32A Depression, unspecified: Secondary | ICD-10-CM

## 2016-11-18 DIAGNOSIS — G4700 Insomnia, unspecified: Secondary | ICD-10-CM

## 2016-11-18 MED ORDER — VENLAFAXINE HCL ER 150 MG PO CP24: ORAL_CAPSULE | ORAL | 3 refills | Status: AC

## 2016-11-18 MED ORDER — VENLAFAXINE HCL ER 150 MG PO CP24: ORAL_CAPSULE | ORAL | 3 refills | Status: DC

## 2016-11-18 MED ORDER — ZOLPIDEM TARTRATE 5 MG PO TABS: 5.0000 mg | ORAL_TABLET | Freq: Every evening | ORAL | 5 refills | Status: DC | PRN

## 2016-11-18 NOTE — Progress Notes (Signed)
Subjective:    Patient ID: Tara Neal, female    DOB: [birth year], 75 y.o.   MRN: [id number]  HPI  Here to f/u, s/p nov 2017 fall with uti, urinary retention, e coli diarrrhea, dehydration, thoracic compression fx nov 1-7, then rehab stay, then home; s.p VP/KP nov 24 per IR.  Home x 1 wk.  Had nocuturia x 20 times night before last, saw urology yesterday neg for UTI, asked to avoid diuretic when out of the home. Pt denies chest pain, increased sob or doe, wheezing, orthopnea, PND, increased LE swelling, palpitations, dizziness or syncope, except for some swelling to the feet for a few hours the evening before last.  Did seen neurology with "toes turning under" felt due to neuropathy either lower back or other related.  Pt daughter remarks about anxiety not well controlled, has difficulty sleeping.  Pt denies polydipsia, polyuria.  Had decreased effoxor to 75 at rehab, asks for increased med again, as well as sleep med such as [place].   Seen in wheelchair but tries to walk as much as possible a home Walks with belt and assistacne, and high risk of fall due to PD.  Past Medical History:  Diagnosis Date  . Allergy   . Anemia    hx of   . Anxiety   . Chronic insomnia 04/22/2012  . COPD (chronic obstructive pulmonary disease) ([place])   . Diverticulosis of colon   . GERD (gastroesophageal reflux disease)   . Glaucoma   . Hormone replacement therapy (postmenopausal)   . Hyperlipidemia   . Hypertension   . Low back pain   . Migraine headache    denies  . Mild obesity   . Osteoarthritis   . Osteopenia   . Osteoporosis   . Pneumonia    hx of 5 years ago   . Scoliosis   . Shortness of breath    with exertion   . Spontaneous pneumothorax    1968  . Umbilical hernia [id number]   Past Surgical History:  Procedure Laterality Date  . EXCISION/RELEASE BURSA HIP  04/26/2012   Procedure: EXCISION/RELEASE BURSA HIP;  Surgeon: Gearlean Alf, MD;  Location: [facility];  Service: Orthopedics;  Laterality:  Right;  . EYE SURGERY     - bil cataract surg and Retina surgery to left eye  . HERNIA REPAIR  [id number]   umbilical hernia  . HIP ADDUCTOR TENOTOMY    . IR GENERIC HISTORICAL  06/17/2016   IR RADIOLOGIST EVAL & MGMT 06/17/2016 [facility]  . IR GENERIC HISTORICAL  07/01/2016   IR VERTEBROPLASTY LUMBAR BX INC UNI/BIL INC/INJECT/IMAGING 07/01/2016 Luanne Bras, MD [facility]  . IR GENERIC HISTORICAL  07/16/2016   IR RADIOLOGIST EVAL & MGMT 07/16/2016 [facility]  . IR GENERIC HISTORICAL  10/22/2016   IR VERTEBROPLASTY CERV/THOR BX INC UNI/BIL INC/INJECT/IMAGING 10/22/2016 Luanne Bras, MD [facility]  . IR GENERIC HISTORICAL  10/22/2016   IR VERTEBROPLASTY EA ADDL (T&LS) BX INC UNI/BIL INC INJECT/IMAGING 10/22/2016 Luanne Bras, MD [facility]  . LUMBAR DISC SURGERY      x 2  . OTHER SURGICAL HISTORY     benign growth removed from right under ear at age 83   . SHOULDER ARTHROSCOPY     left shoulder  . TONSILLECTOMY    . TONSILLECTOMY    . WRIST FRACTURE SURGERY     plate in left wrist/ fractured 2 times    reports that she quit smoking about 35 years ago.  Her smoking use included Cigarettes. She quit after 20.00 years of use. She has never used smokeless tobacco. She reports that she drinks alcohol. She reports that she does not use drugs. family history includes Diabetes in her mother; Heart disease in her mother; Hyperlipidemia in her mother. Allergies  Allergen Reactions  . Codeine Nausea Only    Can take low doses  . Sulfonamide Derivatives Nausea Only    REACTION: Nausea  . Penicillins Rash    .Marland KitchenHas patient had a PCN reaction causing immediate rash, facial/tongue/throat swelling, SOB or lightheadedness with hypotension: No Has patient had a PCN reaction causing severe rash involving mucus membranes or skin necrosis: No Has patient had a PCN reaction that required hospitalization No Has patient had a PCN reaction occurring within the last 10 years: No If  all of the above answers are "NO", then may proceed with Cephalosporin use.   Marland Kitchen Percocet [Oxycodone-Acetaminophen] Itching   Current Outpatient Prescriptions on File Prior to Visit  Medication Sig Dispense Refill  . acetaminophen (TYLENOL) 325 MG tablet Take 650 mg by mouth at bedtime.    . AMBULATORY NON FORMULARY MEDICATION 1 Device by Does not apply route daily. Lift Chair 1 Device 0  . Ascorbic Acid (VITAMIN C) 1000 MG tablet Take 1,000 mg by mouth daily.    Marland Kitchen atenolol (TENORMIN) 50 MG tablet TAKE 1 TABLET EVERY DAY (Patient taking differently: TAKE 50 mg TABLET by mouth once daily) 90 tablet 3  . benazepril (LOTENSIN) 40 MG tablet TAKE 1 TABLET EVERY DAY (Patient taking differently: No sig reported) 90 tablet 3  . calcium carbonate (OS-CAL) 600 MG TABS Take 600 mg by mouth 2 (two) times daily with a meal.    . carbidopa-levodopa (SINEMET IR) 25-100 MG tablet Take 1 tablet by mouth 3 (three) times daily. 90 tablet 3  . Cholecalciferol (VITAMIN D3) 1000 UNITS CAPS Take 1 capsule by mouth daily.    Marland Kitchen denosumab (PROLIA) 60 MG/ML SOLN injection Inject 60 mg into the skin every 6 (six) months. Administer in upper arm, thigh, or abdomen    . divalproex (DEPAKOTE SPRINKLE) 125 MG capsule Take 125 mg by mouth 2 (two) times daily.    . folic acid (FOLVITE) 1 MG tablet Take 1 mg by mouth daily.    Marland Kitchen gabapentin (NEURONTIN) 300 MG capsule Take 300 mg by mouth 3 (three) times daily.    Marland Kitchen guaiFENesin (MUCINEX) 600 MG 12 hr tablet Take by mouth 2 (two) times daily.    Marland Kitchen HYDROcodone-acetaminophen (NORCO/VICODIN) 5-325 MG tablet Take 1 tablet by mouth every 8 (eight) hours as needed for moderate pain. 15 tablet 0  . latanoprost (XALATAN) 0.005 % ophthalmic solution Place 1 drop into both eyes at bedtime.     . magnesium hydroxide (MILK OF MAGNESIA) 400 MG/5ML suspension Take 30 mLs by mouth daily as needed for mild constipation.    . Melatonin 3 MG TABS Take 6 mg by mouth at bedtime.     . meloxicam  (MOBIC) 7.5 MG tablet Take 15 mg by mouth daily.     . methotrexate 250 MG/10ML injection Inject 1 mL (25 mg total) into the skin once a week. 10 mL   . Multiple Vitamin (MULTIVITAMIN) tablet Take 1 tablet by mouth daily.    . Omega-3 Fatty Acids (FISH OIL) 1200 MG CAPS Take 2 capsules by mouth daily.     Marland Kitchen omeprazole (PRILOSEC) 20 MG capsule TAKE 2 CAPSULES EVERY DAY (Patient taking differently: Take 2 capsules  by mouth daily at bedtime.) 180 capsule 3  . polyethylene glycol (MIRALAX / GLYCOLAX) packet Take 17 g by mouth daily.    . sennosides-docusate sodium (SENOKOT-S) 8.6-50 MG tablet Take 1 tablet by mouth daily.    . simvastatin (ZOCOR) 40 MG tablet TAKE 1 TABLET EVERY DAY  AT  6PM (Patient taking differently: No sig reported) 90 tablet 3  . timolol (TIMOPTIC) 0.5 % ophthalmic solution Place 1 drop into both eyes 2 (two) times daily.      No current facility-administered medications on file prior to visit.    Review of Systems  Constitutional: Negative for unusual diaphoresis or night sweats HENT: Negative for ear swelling or discharge Eyes: Negative for worsening visual haziness  Respiratory: Negative for choking and stridor.   Gastrointestinal: Negative for distension or worsening eructation Genitourinary: Negative for retention or change in urine volume.  Musculoskeletal: Negative for other MSK pain or swelling Skin: Negative for color change and worsening wound Neurological: Negative for tremors and numbness other than noted  Psychiatric/Behavioral: Negative for decreased concentration or agitation other than above   All other system neg per pt and family    Objective:   Physical Exam BP 128/78   Pulse (!) 53   Resp 20   Wt 117 lb (53.1 kg)   SpO2 94%   BMI 19.77 kg/m  VS noted,  Constitutional: Pt appears in no apparent distress HENT: Head: NCAT.  Right Ear: External ear normal.  Left Ear: External ear normal.  Eyes: . Pupils are equal, round, and reactive to light.  Conjunctivae and EOM are normal Neck: Normal range of motion. Neck supple.  Cardiovascular: Normal rate and regular rhythm.   Pulmonary/Chest: Effort normal and breath sounds without rales or wheezing.  Abd:  Soft, NT, ND, + BS Neurological: Pt is alert. Not confused , motor grossly intact Skin: Skin is warm. No rash, no LE edema Psychiatric: Pt behavior is normal. No agitation. + mild depressed affect  POCT - a1c - 6.8    Assessment & Plan:

## 2016-11-18 NOTE — Assessment & Plan Note (Addendum)
Diet controlled, on no OHA,  stable overall by history and exam, recent data reviewed with pt, and pt to continue medical treatment as before,  to f/u any worsening symptoms or concerns Lab Results  Component Value Date   HGBA1C 5.7 03/31/2016   Note:  Total time for pt hx, exam, review of record with pt in the room, determination of diagnoses and plan for further eval and tx is > 40 min, with over 50% spent in coordination and counseling of patient

## 2016-11-18 NOTE — Progress Notes (Signed)
Pre visit review using our clinic review tool, if applicable. No additional management support is needed unless otherwise documented below in the visit note. 

## 2016-11-18 NOTE — Patient Instructions (Signed)
Your A1c was ok today  Please take all new medication as prescribed - the ambien   Please continue all other medications as before, and refills have been done if requested. - the effexor XR at 150 mg per day  Please have the pharmacy call with any other refills you may need.  You will be contacted regarding the referral for: Home Health for RN and PT, as well as possibly [facility] if you qualify  Please keep your appointments with your specialists as you may have planned  Please return in 3 months, or sooner if needed

## 2016-11-20 DIAGNOSIS — F329 Major depressive disorder, single episode, unspecified: Secondary | ICD-10-CM | POA: Insufficient documentation

## 2016-11-20 DIAGNOSIS — F0390 Unspecified dementia without behavioral disturbance: Secondary | ICD-10-CM | POA: Insufficient documentation

## 2016-11-20 DIAGNOSIS — F32A Depression, unspecified: Secondary | ICD-10-CM | POA: Insufficient documentation

## 2016-11-20 NOTE — Assessment & Plan Note (Signed)
stable overall by history and exam, recent data reviewed with pt, and pt to continue medical treatment as before,  to f/u any worsening symptoms or concerns Lab Results  Component Value Date   WBC 9.8 10/22/2016   HGB 12.1 10/22/2016   HCT 36.4 10/22/2016   PLT 400 10/22/2016   GLUCOSE 109 (H) 10/22/2016   CHOL 128 03/31/2016   TRIG 204.0 (H) 03/31/2016   HDL 35.10 (L) 03/31/2016   LDLDIRECT 63.0 03/31/2016   LDLCALC 47 06/06/2014   ALT <5 (L) 10/01/2016   AST 18 10/01/2016   NA 135 10/22/2016   K 4.4 10/22/2016   CL 102 10/22/2016   CREATININE 0.67 10/22/2016   BUN 14 10/22/2016   CO2 24 10/22/2016   TSH 1.34 03/31/2016   INR 0.95 10/22/2016   HGBA1C 5.7 03/31/2016   MICROALBUR 1.3 03/31/2016

## 2016-11-20 NOTE — Assessment & Plan Note (Signed)
Worsening, for [facility] with PT and RN

## 2016-11-20 NOTE — Assessment & Plan Note (Signed)
Mild to mod, for ambien prn,  to f/u any worsening symptoms or concerns  

## 2016-11-20 NOTE — Assessment & Plan Note (Signed)
Ok for [employer] xr 150 qd as as before,  to f/u any worsening symptoms or concerns

## 2016-11-26 ENCOUNTER — Other Ambulatory Visit (HOSPITAL_COMMUNITY): Payer: Self-pay | Admitting: *Deleted

## 2016-11-29 ENCOUNTER — Other Ambulatory Visit: Payer: Self-pay

## 2016-11-29 NOTE — Patient Outreach (Signed)
[name]) Care Management  11/29/2016  Tara Neal [birth year] [id number]  REFERRAL DATE; 11/18/16  REFERRAL SOURCE: Primary MD REFERRAL REASON; Consult dementia, home bound CONSENT: Self / Patient gave verbal authorization to speak with her husband, Brittlynn Chanda and daughter, [name] regarding all of her personal health information.  PROVIDERS: Dr. [name] - primary MD Dr. Carles Collet - neurologist.  SOCIAL: Patient resides with her husband.  Has a  Daughter and son in law that lives within a few miles of her. Patient requires total care. Patient require stand by assistance with belt for ambulation. Uses wheelchair and walker.  ADVANCE DIRECTIVE;  Patient reports no health care power of attorney or living will.  MEDICATIONS: Patient states she is on approximately 15 medications or more. States she is able to afford her medications at this time. Patient denies any questions or side effects regarding her medications.   SUBJECTIVE; Telephone call to patient. HIPAA verified. RNCM spoke with both patient and spouse, Tara Neal. Discussed and offered [facility] care management services. Patient verbally agreed. Patient states she and her husband spoke to her doctor about getting her physical therapy.Patient states, " I can;t walk unless someone is standing beside me and holding on to the belt.  I will fall backwards if I'm not assisted."  Patient request RNCM call her daughter Tabathia Meddings and discuss further health information.  RNCM contacted patients daughter, Tara Neal.  HIPAA verified with daughter for patient. 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 Daughter states its like parkinson's disease.  Patient has progressive supranuclear palsy.  Patients daughter states disease process has  Progressed and patient has "gone down hill within the past 2 years.  Patient lives with spouse who also has medical conditions and is the primary caregiver of patient.  Daughter states patients husband was admitted to the hospital after patients admission due to his health conditions. Patient states she requested physical therapy from her primary MD which has not started.  Patient's  also needs assistance with bathing.  Daughter states she lives close to patient but recently sustained fall with injury and is unable to assist with patients care at this time.  upset when discussing long term plans for her care.   Daughter states  Patient was in [facility] in November 2017 due to fall / fracture.States patient is confined to a wheelchair and can use a walker short distance with belt and stand by assistance.   ASSESSMENT: Patient will benefit from community case management follow up for care coordination and in home needs assessment. Request community nurse assess for social worker needs. Patient will benefit from pharmacy referral due to polypharmacy.   PLAN: RNCM  Will refer patient to community case management and pharmacy.   Quinn Plowman RN,BSN,CCM [facility] Telephonic  [phone]

## 2016-11-30 ENCOUNTER — Ambulatory Visit (HOSPITAL_COMMUNITY)
Admission: RE | Admit: 2016-11-30 | Discharge: 2016-11-30 | Disposition: A | Discharge status: Home / Self Care | Payer: Medicare Other | Source: Ambulatory Visit | Attending: Internal Medicine | Admitting: Internal Medicine

## 2016-11-30 DIAGNOSIS — M810 Age-related osteoporosis without current pathological fracture: Secondary | ICD-10-CM | POA: Diagnosis not present

## 2016-11-30 MED ORDER — DENOSUMAB 60 MG/ML ~~LOC~~ SOLN
60.0000 mg | Freq: Once | SUBCUTANEOUS | Status: AC
  Administered 2016-11-30: 14:00:00 60 mg via SUBCUTANEOUS
  Filled 2016-11-30: qty 1

## 2016-11-30 NOTE — Discharge Instructions (Signed)
Denosumab injection What is this medicine? DENOSUMAB (den oh sue mab) slows bone breakdown. Prolia is used to treat osteoporosis in women after menopause and in men. Xgeva is used to prevent bone fractures and other bone problems caused by cancer bone metastases. Xgeva is also used to treat giant cell tumor of the bone. COMMON BRAND NAME(S): Prolia, XGEVA What should I tell my health care provider before I take this medicine? They need to know if you have any of these conditions: -dental disease -eczema -infection or history of infections -kidney disease or on dialysis -low blood calcium or vitamin D -malabsorption syndrome -scheduled to have surgery or tooth extraction -taking medicine that contains denosumab -thyroid or parathyroid disease -an unusual reaction to denosumab, other medicines, foods, dyes, or preservatives -pregnant or trying to get pregnant -breast-feeding How should I use this medicine? This medicine is for injection under the skin. It is given by a health care professional in a hospital or clinic setting. If you are getting Prolia, a special MedGuide will be given to you by the pharmacist with each prescription and refill. Be sure to read this information carefully each time. For Prolia, talk to your pediatrician regarding the use of this medicine in children. Special care may be needed. For Xgeva, talk to your pediatrician regarding the use of this medicine in children. While this drug may be prescribed for children as young as 13 years for selected conditions, precautions do apply. What if I miss a dose? It is important not to miss your dose. Call your doctor or health care professional if you are unable to keep an appointment. What may interact with this medicine? Do not take this medicine with any of the following medications: -other medicines containing denosumab This medicine may also interact with the following medications: -medicines that suppress the immune  system -medicines that treat cancer -steroid medicines like prednisone or cortisone What should I watch for while using this medicine? Visit your doctor or health care professional for regular checks on your progress. Your doctor or health care professional may order blood tests and other tests to see how you are doing. Call your doctor or health care professional if you get a cold or other infection while receiving this medicine. Do not treat yourself. This medicine may decrease your body's ability to fight infection. You should make sure you get enough calcium and vitamin D while you are taking this medicine, unless your doctor tells you not to. Discuss the foods you eat and the vitamins you take with your health care professional. See your dentist regularly. Brush and floss your teeth as directed. Before you have any dental work done, tell your dentist you are receiving this medicine. Do not become pregnant while taking this medicine or for 5 months after stopping it. Women should inform their doctor if they wish to become pregnant or think they might be pregnant. There is a potential for serious side effects to an unborn child. Talk to your health care professional or pharmacist for more information. What side effects may I notice from receiving this medicine? Side effects that you should report to your doctor or health care professional as soon as possible: -allergic reactions like skin rash, itching or hives, swelling of the face, lips, or tongue -breathing problems -chest pain -fast, irregular heartbeat -feeling faint or lightheaded, falls -fever, chills, or any other sign of infection -muscle spasms, tightening, or twitches -numbness or tingling -skin blisters or bumps, or is dry, peels, or red -slow   healing or unexplained pain in the mouth or jaw -unusual bleeding or bruising Side effects that usually do not require medical attention (report to your doctor or health care professional  if they continue or are bothersome): -muscle pain -stomach upset, gas Where should I keep my medicine? This medicine is only given in a clinic, doctor's office, or other health care setting and will not be stored at home.  2017 Elsevier/Gold Standard (2015-12-18 10:06:55)  

## 2016-12-01 DIAGNOSIS — E1140 Type 2 diabetes mellitus with diabetic neuropathy, unspecified: Secondary | ICD-10-CM | POA: Diagnosis not present

## 2016-12-01 DIAGNOSIS — M1990 Unspecified osteoarthritis, unspecified site: Secondary | ICD-10-CM | POA: Diagnosis not present

## 2016-12-01 DIAGNOSIS — N181 Chronic kidney disease, stage 1: Secondary | ICD-10-CM | POA: Diagnosis not present

## 2016-12-01 DIAGNOSIS — G20 Parkinson's disease: Secondary | ICD-10-CM | POA: Diagnosis not present

## 2016-12-01 DIAGNOSIS — E1122 Type 2 diabetes mellitus with diabetic chronic kidney disease: Secondary | ICD-10-CM | POA: Diagnosis not present

## 2016-12-01 DIAGNOSIS — J449 Chronic obstructive pulmonary disease, unspecified: Secondary | ICD-10-CM | POA: Diagnosis not present

## 2016-12-06 ENCOUNTER — Ambulatory Visit (HOSPITAL_BASED_OUTPATIENT_CLINIC_OR_DEPARTMENT_OTHER): Payer: Medicare Other | Admitting: Hematology and Oncology

## 2016-12-06 ENCOUNTER — Ambulatory Visit (HOSPITAL_BASED_OUTPATIENT_CLINIC_OR_DEPARTMENT_OTHER): Payer: Medicare Other

## 2016-12-06 ENCOUNTER — Telehealth: Payer: Self-pay | Admitting: Hematology and Oncology

## 2016-12-06 ENCOUNTER — Other Ambulatory Visit: Payer: Self-pay

## 2016-12-06 DIAGNOSIS — R413 Other amnesia: Secondary | ICD-10-CM | POA: Diagnosis not present

## 2016-12-06 DIAGNOSIS — D472 Monoclonal gammopathy: Secondary | ICD-10-CM

## 2016-12-06 LAB — CBC WITH DIFFERENTIAL/PLATELET
BASO%: 0.6 % (ref 0.0–2.0)
BASOS ABS: 0 10*3/uL (ref 0.0–0.1)
EOS ABS: 0.1 10*3/uL (ref 0.0–0.5)
EOS%: 0.9 % (ref 0.0–7.0)
HEMATOCRIT: 38.7 % (ref 34.8–46.6)
HGB: 12.7 g/dL (ref 11.6–15.9)
LYMPH#: 1.6 10*3/uL (ref 0.9–3.3)
LYMPH%: 21.3 % (ref 14.0–49.7)
MCH: 32.6 pg (ref 25.1–34.0)
MCHC: 32.8 g/dL (ref 31.5–36.0)
MCV: 99.3 fL (ref 79.5–101.0)
MONO#: 0.5 10*3/uL (ref 0.1–0.9)
MONO%: 6.7 % (ref 0.0–14.0)
NEUT#: 5.4 10*3/uL (ref 1.5–6.5)
NEUT%: 70.5 % (ref 38.4–76.8)
PLATELETS: 264 10*3/uL (ref 145–400)
RBC: 3.9 10*6/uL (ref 3.70–5.45)
RDW: 14.2 % (ref 11.2–14.5)
WBC: 7.7 10*3/uL (ref 3.9–10.3)

## 2016-12-06 LAB — COMPREHENSIVE METABOLIC PANEL
ALT: 7 U/L (ref 0–55)
ANION GAP: 10 meq/L (ref 3–11)
AST: 21 U/L (ref 5–34)
Albumin: 3.9 g/dL (ref 3.5–5.0)
Alkaline Phosphatase: 107 U/L (ref 40–150)
BILIRUBIN TOTAL: 0.39 mg/dL (ref 0.20–1.20)
BUN: 10.5 mg/dL (ref 7.0–26.0)
CALCIUM: 9.2 mg/dL (ref 8.4–10.4)
CHLORIDE: 105 meq/L (ref 98–109)
CO2: 25 mEq/L (ref 22–29)
CREATININE: 0.8 mg/dL (ref 0.6–1.1)
EGFR: 78 mL/min/{1.73_m2} — ABNORMAL LOW (ref >90)
Glucose: 97 mg/dl (ref 70–140)
Potassium: 3.5 mEq/L (ref 3.5–5.1)
Sodium: 140 mEq/L (ref 136–145)
Total Protein: 7.1 g/dL (ref 6.4–8.3)

## 2016-12-06 LAB — LACTATE DEHYDROGENASE: LDH: 171 U/L (ref 125–245)

## 2016-12-06 NOTE — Telephone Encounter (Signed)
PATIENT SENT BACK TO LAB AND GIVEN AVS REPORT. PER 1/8 LOS NO RTN VISIT.

## 2016-12-06 NOTE — Patient Outreach (Signed)
New referral for community for assessment:  Placed call to patient and or husband. No answer. Left a message requesting a call back.  PLAN: Will continue to outreach patient.   [name], RN, BSN, CEN [facility] [employer] [phone]

## 2016-12-07 ENCOUNTER — Telehealth: Payer: Self-pay | Admitting: Emergency Medicine

## 2016-12-07 ENCOUNTER — Encounter: Payer: Self-pay | Admitting: Hematology and Oncology

## 2016-12-07 DIAGNOSIS — G20 Parkinson's disease: Secondary | ICD-10-CM | POA: Diagnosis not present

## 2016-12-07 DIAGNOSIS — E1122 Type 2 diabetes mellitus with diabetic chronic kidney disease: Secondary | ICD-10-CM | POA: Diagnosis not present

## 2016-12-07 DIAGNOSIS — M1990 Unspecified osteoarthritis, unspecified site: Secondary | ICD-10-CM | POA: Diagnosis not present

## 2016-12-07 DIAGNOSIS — E1140 Type 2 diabetes mellitus with diabetic neuropathy, unspecified: Secondary | ICD-10-CM | POA: Diagnosis not present

## 2016-12-07 DIAGNOSIS — J449 Chronic obstructive pulmonary disease, unspecified: Secondary | ICD-10-CM | POA: Diagnosis not present

## 2016-12-07 DIAGNOSIS — N181 Chronic kidney disease, stage 1: Secondary | ICD-10-CM | POA: Diagnosis not present

## 2016-12-07 LAB — KAPPA/LAMBDA LIGHT CHAINS
IG KAPPA FREE LIGHT CHAIN: 18.8 mg/L (ref 3.3–19.4)
IG LAMBDA FREE LIGHT CHAIN: 17.9 mg/L (ref 5.7–26.3)
Kappa/Lambda FluidC Ratio: 1.05 (ref 0.26–1.65)

## 2016-12-07 NOTE — Assessment & Plan Note (Signed)
The patient is frail, with high fall risk and poor memory I will call the patient's husband with test results rather than bringing her back to review results

## 2016-12-07 NOTE — Telephone Encounter (Signed)
[facility] called and is request verbal orders for PT 2 times a week for 6 weeks. Please advise thanks.

## 2016-12-07 NOTE — Telephone Encounter (Signed)
Ok for verbal 

## 2016-12-07 NOTE — Telephone Encounter (Signed)
Verbals given  

## 2016-12-07 NOTE — Progress Notes (Signed)
[place] NOTE  Patient Care Team: Biagio Borg, MD as PCP - General Dannielle Karvonen, RN as [place], RN as [place] S Tat, DO as Consulting Physician (Neurology)  CHIEF COMPLAINTS/PURPOSE OF CONSULTATION:  Abnormal serum protein electrophoresis, neurological weakness, chronic back pain with history of vertebral fracture, exclude multiple myeloma  HISTORY OF PRESENTING ILLNESS:  Tara Neal 76 y.o. female is here because of constellation of abnormal problems as above The patient has poor memory. Her husband of 11 years provided most of the history The patient have chronic back pain, progressive memory loss, difficulties with balance, poor appetite and recent weight loss She follows with numerous other doctors and was subsequently referred to a neurologist for further workup I review her records extensively. She denies history of abnormal bone pain or bone fracture. Patient denies history of recurrent infection or atypical infections such as shingles of meningitis. Denies chills, night sweats, anorexia or abnormal weight loss. She had CT scan of the spine and MRI spine which showed diffuse degenerative joint disease as well as thoracic vertebral fractures. She is managed conservatively with pain medicine along with vertebroplasty which has helped with her back pain  In December 2017, serum protein electrophoresis come back showing mild abnormality and she was referred here  Her husband struggled to take care of her. She is very frail. She is very prone to falls She has no signs of anemia or renal failure or hypercalcemia Denies recurrent infection except for urinary tract infection  MEDICAL HISTORY:  Past Medical History:  Diagnosis Date  . Allergy   . Anemia    hx of   . Anxiety   . Chronic insomnia 04/22/2012  . COPD (chronic obstructive pulmonary disease) ([place])   .  Diverticulosis of colon   . GERD (gastroesophageal reflux disease)   . Glaucoma   . Hormone replacement therapy (postmenopausal)   . Hyperlipidemia   . Hypertension   . Low back pain   . Migraine headache    denies  . Mild obesity   . Osteoarthritis   . Osteopenia   . Osteoporosis   . Pneumonia    hx of 5 years ago   . Scoliosis   . Shortness of breath    with exertion   . Spontaneous pneumothorax    1968  . Umbilical hernia 11/29/7508    SURGICAL HISTORY: Past Surgical History:  Procedure Laterality Date  . EXCISION/RELEASE BURSA HIP  04/26/2012   Procedure: EXCISION/RELEASE BURSA HIP;  Surgeon: Gearlean Alf, MD;  Location: [facility];  Service: Orthopedics;  Laterality: Right;  . EYE SURGERY     - bil cataract surg and Retina surgery to left eye  . HERNIA REPAIR  25/85/2778   umbilical hernia  . HIP ADDUCTOR TENOTOMY    . IR GENERIC HISTORICAL  06/17/2016   IR RADIOLOGIST EVAL & MGMT 06/17/2016 [facility]  . IR GENERIC HISTORICAL  07/01/2016   IR VERTEBROPLASTY LUMBAR BX INC UNI/BIL INC/INJECT/IMAGING 07/01/2016 Luanne Bras, MD [facility]  . IR GENERIC HISTORICAL  07/16/2016   IR RADIOLOGIST EVAL & MGMT 07/16/2016 [facility]  . IR GENERIC HISTORICAL  10/22/2016   IR VERTEBROPLASTY CERV/THOR BX INC UNI/BIL INC/INJECT/IMAGING 10/22/2016 Luanne Bras, MD [facility]  . IR GENERIC HISTORICAL  10/22/2016   IR VERTEBROPLASTY EA ADDL (T&LS) BX INC UNI/BIL INC INJECT/IMAGING 10/22/2016 Luanne Bras, MD [facility]  . LUMBAR DISC  SURGERY      x 2  . OTHER SURGICAL HISTORY     benign growth removed from right under ear at age 11   . SHOULDER ARTHROSCOPY     left shoulder  . TONSILLECTOMY    . TONSILLECTOMY    . WRIST FRACTURE SURGERY     plate in left wrist/ fractured 2 times    SOCIAL HISTORY: Social History   Social History  . Marital status: Married    Spouse name: N/A  . Number of children: N/A  . Years of education: N/A   Occupational  History  . Not on file.   Social History Main Topics  . Smoking status: Former Smoker    Years: 20.00    Types: Cigarettes    Quit date: 11/29/1981  . Smokeless tobacco: Never Used  . Alcohol use Yes     Comment: one glass of wine every 3 months  . Drug use: No  . Sexual activity: Not on file   Other Topics Concern  . 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 Hyperlipidemia Mother   . Heart disease Mother   . Diabetes Mother     ALLERGIES:  is allergic to codeine; sulfonamide derivatives; penicillins; and percocet [oxycodone-acetaminophen].  MEDICATIONS:  Current Outpatient Prescriptions  Medication Sig Dispense Refill  . Ascorbic Acid (VITAMIN C) 1000 MG tablet Take 1,000 mg by mouth daily.    Marland Kitchen atenolol (TENORMIN) 50 MG tablet TAKE 1 TABLET EVERY DAY 90 tablet 3  . benazepril (LOTENSIN) 40 MG tablet TAKE 1 TABLET EVERY DAY 90 tablet 3  . calcium carbonate (OS-CAL) 600 MG TABS Take 600 mg by mouth 2 (two) times daily with a meal.    . carbidopa-levodopa (SINEMET IR) 25-100 MG tablet Take 1 tablet by mouth 3 (three) times daily. 90 tablet 3  . Cholecalciferol (VITAMIN D3) 1000 UNITS CAPS Take 1 capsule by mouth daily.    Marland Kitchen denosumab (PROLIA) 60 MG/ML SOLN injection Inject 60 mg into the skin every 6 (six) months. Administer in upper arm, thigh, or abdomen    . divalproex (DEPAKOTE SPRINKLE) 125 MG capsule Take 125 mg by mouth 2 (two) times daily.    . folic acid (FOLVITE) 1 MG tablet Take 1 mg by mouth daily.    Marland Kitchen gabapentin (NEURONTIN) 300 MG capsule Take 300 mg by mouth 3 (three) times daily.    Marland Kitchen guaiFENesin (MUCINEX) 600 MG 12 hr tablet Take by mouth 2 (two) times daily as needed.     Marland Kitchen HYDROcodone-acetaminophen (NORCO/VICODIN) 5-325 MG tablet Take 1 tablet by mouth every 8 (eight) hours as needed for moderate pain. 15 tablet 0  . latanoprost (XALATAN) 0.005 % ophthalmic  solution Place 1 drop into both eyes at bedtime.     . magnesium hydroxide (MILK OF MAGNESIA) 400 MG/5ML suspension Take 30 mLs by mouth daily as needed for mild constipation.    . methotrexate 250 MG/10ML injection Inject 1 mL (25 mg total) into the skin once a week. 10 mL   . Multiple Vitamin (MULTIVITAMIN) tablet Take 1 tablet by mouth daily.    . Omega-3 Fatty Acids (FISH OIL) 1200 MG CAPS Take 2 capsules by mouth daily.     Marland Kitchen omeprazole (PRILOSEC) 20 MG capsule TAKE 2 CAPSULES EVERY DAY 180 capsule 3  . polyethylene glycol (MIRALAX / GLYCOLAX) packet Take  17 g by mouth daily as needed.     . simvastatin (ZOCOR) 40 MG tablet TAKE 1 TABLET EVERY DAY  AT  6PM 90 tablet 3  . timolol (TIMOPTIC) 0.5 % ophthalmic solution Place 1 drop into both eyes 2 (two) times daily.     Marland Kitchen venlafaxine XR (EFFEXOR-XR) 150 MG 24 hr capsule TAKE 1 CAPSULE EVERY DAY WITH BREAKFAST 90 capsule 3  . zolpidem (AMBIEN) 5 MG tablet Take 1 tablet (5 mg total) by mouth at bedtime as needed for sleep. 30 tablet 5  . alendronate (FOSAMAX) 70 MG tablet Take 70 mg by mouth once a week.    . furosemide (LASIX) 40 MG tablet Take 40 mg by mouth daily.    . meloxicam (MOBIC) 7.5 MG tablet Take 15 mg by mouth daily.      No current facility-administered medications for this visit.     REVIEW OF SYSTEMS:   Eyes: Denies blurriness of vision, double vision or watery eyes Ears, nose, mouth, throat, and face: Denies mucositis or sore throat Respiratory: Denies cough, dyspnea or wheezes Cardiovascular: Denies palpitation, chest discomfort or lower extremity swelling Gastrointestinal:  Denies nausea, heartburn or change in bowel habits Skin: Denies abnormal skin rashes Lymphatics: Denies new lymphadenopathy or easy bruising Behavioral/Psych: Mood is stable, no new changes  All other systems were reviewed with the patient and are negative.  PHYSICAL EXAMINATION: ECOG PERFORMANCE STATUS: 3 - Symptomatic, >50% confined to  bed  Vitals:   12/06/16 1330  BP: (!) 162/74  Pulse: 64  Resp: 17  Temp: 98.2 F (36.8 C)   Filed Weights   12/06/16 1330  Weight: 118 lb (53.5 kg)    GENERAL:alert, no distress and comfortable. She is frail and elderly. She looks thin SKIN: skin color, texture, turgor are normal, no rashes or significant lesions EYES: normal, conjunctiva are pink and non-injected, sclera clear OROPHARYNX:no exudate, no erythema and lips, buccal mucosa, and tongue normal  NECK: supple, thyroid normal size, non-tender, without nodularity LYMPH:  no palpable lymphadenopathy in the cervical, axillary or inguinal LUNGS: clear to auscultation and percussion with normal breathing effort HEART: regular rate & rhythm and no murmurs and no lower extremity edema ABDOMEN:abdomen soft, non-tender and normal bowel sounds Musculoskeletal:no cyanosis of digits and no clubbing  PSYCH: alert but cannot provide much history. NEURO: Unable to assess. She sits on the wheelchair  LABORATORY DATA:  I have reviewed the data as listed Lab Results  Component Value Date   WBC 7.7 12/06/2016   HGB 12.7 12/06/2016   HCT 38.7 12/06/2016   MCV 99.3 12/06/2016   PLT 264 12/06/2016    RADIOGRAPHIC STUDIES:I reviewed her prior MRI  I have personally reviewed the radiological images as listed and agreed with the findings in the report.   ASSESSMENT & PLAN:   MGUS (monoclonal gammopathy of unknown significance) Her recent serum protein electrophoresis is marginally abnormal. She is relatively asymptomatic. Recent blood work did not show signs of anemia, hypercalcemia or renal failure She had numerous imaging studies done in the past which did not show abnormal bone lesions related to myeloma I do not feel strongly we need to repeat imaging study with skeletal survey The patient has problems with bladder control I doubt she can complete 24-hour urine collection I will order basic workup with blood work only. Her  husband has difficulties transporting her to the [place] I will call him with test results next week If blood work is unremarkable, she does  not need further workup or follow-up  Memory loss The patient is frail, with high fall risk and poor memory I will call the patient's husband with test results rather than bringing her back to review results   Orders Placed This Encounter  Procedures  . CBC with Differential/Platelet    Standing Status:   Future    Number of Occurrences:   1    Standing Expiration Date:   01/10/2018  . Comprehensive metabolic panel    Standing Status:   Future    Number of Occurrences:   1    Standing Expiration Date:   01/10/2018  . Lactate dehydrogenase    Standing Status:   Future    Number of Occurrences:   1    Standing Expiration Date:   01/10/2018  . Kappa/lambda light chains    Standing Status:   Future    Number of Occurrences:   1    Standing Expiration Date:   01/10/2018  . Multiple Myeloma Panel (SPEP&IFE w/QIG)    Standing Status:   Future    Number of Occurrences:   1    Standing Expiration Date:   01/10/2018    All questions were answered. The patient knows to call the clinic with any problems, questions or concerns. I spent 40 minutes counseling the patient face to face. The total time spent in the appointment was 55 minutes and more than 50% was on counseling.     Heath Lark, MD 12/07/16 5:17 PM

## 2016-12-07 NOTE — Assessment & Plan Note (Signed)
Her recent serum protein electrophoresis is marginally abnormal. She is relatively asymptomatic. Recent blood work did not show signs of anemia, hypercalcemia or renal failure She had numerous imaging studies done in the past which did not show abnormal bone lesions related to myeloma I do not feel strongly we need to repeat imaging study with skeletal survey The patient has problems with bladder control I doubt she can complete 24-hour urine collection I will order basic workup with blood work only. Her husband has difficulties transporting her to the [place] I will call him with test results next week If blood work is unremarkable, she does not need further workup or follow-up

## 2016-12-08 ENCOUNTER — Other Ambulatory Visit: Payer: Self-pay | Admitting: Pharmacist

## 2016-12-08 ENCOUNTER — Other Ambulatory Visit: Payer: Self-pay

## 2016-12-08 LAB — MULTIPLE MYELOMA PANEL, SERUM
ALBUMIN/GLOB SERPL: 1.4 (ref 0.7–1.7)
ALPHA 1: 0.3 g/dL (ref 0.0–0.4)
Albumin SerPl Elph-Mcnc: 4 g/dL (ref 2.9–4.4)
Alpha2 Glob SerPl Elph-Mcnc: 0.7 g/dL (ref 0.4–1.0)
B-Globulin SerPl Elph-Mcnc: 1 g/dL (ref 0.7–1.3)
GAMMA GLOB SERPL ELPH-MCNC: 1 g/dL (ref 0.4–1.8)
GLOBULIN, TOTAL: 2.9 g/dL (ref 2.2–3.9)
IGA/IMMUNOGLOBULIN A, SERUM: 249 mg/dL (ref 64–422)
IgG, Qn, Serum: 1040 mg/dL (ref 700–1600)
IgM, Qn, Serum: 45 mg/dL (ref 26–217)
Total Protein: 6.9 g/dL (ref 6.0–8.5)

## 2016-12-08 NOTE — Patient Outreach (Signed)
Care coordination for home visit and assessment:  Placed call about new referral. Spoke with husband who reports being overwhelmed with caring for himself and wife.  Offered home visit and husband has accepted home visit for Tuesday January 16th at 12:15pm.  Husband wishes to complete medication review and assessments in person.   PLAN: provided my contact information. Confirmed address. Home visit planned for 12/14/2016.  [name], RN, BSN, CEN [facility] [employer] [phone]

## 2016-12-08 NOTE — Patient Outreach (Signed)
Tara Neal is a 76 y.o. female referred to pharmacy for medication management for polypharmacy. Called and spoke with patient in order to perform a medication review. HIPAA identifiers verified and verbal consent received. Tara Neal reports that it is not currently a good time for her to talk and asks that I call back in an hour. Let Tara Neal know that I will call back at 4 pm, as requested.  Call Tara Neal back and patient asks that/gives me permission to speak with her husband. Tara Neal reports that he is feeling overwhelmed with everything that there is to do in the home and in caring for himself and his wife. Reports that he has been in contact with Dr. [name] about home health services for his wife. Reports that he and his wife have no medication questions/concerns at this time. Reports that his daughter, Tara Neal, manages the patient's medications, including filling her weekly pillboxes.   Tara Neal expresses interest in meeting with RNCM [name]. Reports that he received her voicemail message and would like to schedule an appointment with her when his daughter can also be present.   Provide Tara Neal with my phone number and let Tara Neal know that I will perform a medication review for the patient following their visit with [name].   PLAN:  1) Will place a coordination of care call to [place] today. Will let [name] know that the patient and husband would like to schedule a home visit with her at a time when their daughter can also be present.   2) Following RNCM home visit with the patient, will complete a medication review with updated medication list.  Harlow Asa, PharmD, [facility] [phone]

## 2016-12-09 DIAGNOSIS — G20 Parkinson's disease: Secondary | ICD-10-CM | POA: Diagnosis not present

## 2016-12-09 DIAGNOSIS — E1122 Type 2 diabetes mellitus with diabetic chronic kidney disease: Secondary | ICD-10-CM | POA: Diagnosis not present

## 2016-12-09 DIAGNOSIS — N181 Chronic kidney disease, stage 1: Secondary | ICD-10-CM | POA: Diagnosis not present

## 2016-12-09 DIAGNOSIS — E1140 Type 2 diabetes mellitus with diabetic neuropathy, unspecified: Secondary | ICD-10-CM | POA: Diagnosis not present

## 2016-12-09 DIAGNOSIS — J449 Chronic obstructive pulmonary disease, unspecified: Secondary | ICD-10-CM | POA: Diagnosis not present

## 2016-12-09 DIAGNOSIS — M1990 Unspecified osteoarthritis, unspecified site: Secondary | ICD-10-CM | POA: Diagnosis not present

## 2016-12-13 ENCOUNTER — Telehealth: Payer: Self-pay | Admitting: Hematology and Oncology

## 2016-12-13 NOTE — Telephone Encounter (Signed)
I reviewed blood test results with her husband. Her myeloma panel came back within normal limits She does not need further workup or follow-up from my standpoint

## 2016-12-14 ENCOUNTER — Other Ambulatory Visit: Payer: Self-pay

## 2016-12-14 DIAGNOSIS — N181 Chronic kidney disease, stage 1: Secondary | ICD-10-CM | POA: Diagnosis not present

## 2016-12-14 DIAGNOSIS — E1122 Type 2 diabetes mellitus with diabetic chronic kidney disease: Secondary | ICD-10-CM | POA: Diagnosis not present

## 2016-12-14 DIAGNOSIS — J449 Chronic obstructive pulmonary disease, unspecified: Secondary | ICD-10-CM | POA: Diagnosis not present

## 2016-12-14 DIAGNOSIS — G20 Parkinson's disease: Secondary | ICD-10-CM | POA: Diagnosis not present

## 2016-12-14 DIAGNOSIS — E1140 Type 2 diabetes mellitus with diabetic neuropathy, unspecified: Secondary | ICD-10-CM | POA: Diagnosis not present

## 2016-12-14 DIAGNOSIS — M1990 Unspecified osteoarthritis, unspecified site: Secondary | ICD-10-CM | POA: Diagnosis not present

## 2016-12-14 NOTE — Patient Outreach (Signed)
[facility]) Care Management   12/14/2016  Tara Neal 1941/07/07 [id number]  Tara Neal is an 76 y.o. female 12:10pm  Arrived for home visit. Daughter and husband present for home visit. Patient sitting in lift chair. Well groomed. Home neat and clean.  1 dog.   Assessment of needs home visit. Subjective: Patient reports that she feels like she is a burden to her family as she is unable to do anything for herself.  Husband reports that he is having difficulty managing patient at home due to his own medical issues.  Reports patient is up numerous times at night to go to the bathroom. Sometimes every 2-3 hours. Reports that patient is very demanding and does not like to be left in a room by herself even for husband to feed the cat outside.  Husband reports that patient yells out loud when there is no one in sight. Patient reports that she is scared to be alone. Patient admits to feeling anxious and depressed.  Daughter reports that patient has gotten more difficult for her father to take care of. Patient reports very little interest in doing anything at home.  Husband reports that when she works a puzzle she gets very angry when she can not find the right piece.  Daughter and husband report patient has repetitive movements of her hands. Patient reports that she can control this.  Patient denies any current issues with medical problems with the exception of neurological palsy.  Reports recent A1c of 5.7.  Not on any medications for DM.   Husband reports that patient is active with [facility] but bath aid never came.  Husband reports patient has frequent falls.  Patient reports that she does not want to go back to nursing home but is willing to have assistance in the home with bathing and ADLS.  Husband and daughter in agreement.   Objective:   Awake and alert. Able to verbalize Month, year, president. Unable to verbalize her anniversary or daughters birth.   Ambulates in home with walker and a gait belt. Noted that when patient stands she leans backwards. Observed patients inability to pull up pants when in bathroom. Patient does not walk inside walker and often needs verbal reminders.  Able to use her lift chair with verbal reminders.  Vitals:   12/14/16 1248  BP: 126/72  Pulse: 67  SpO2: 98%  Weight: 118 lb (53.5 kg)  Height: 1.626 m (5\' 4" )   Review of Systems  Constitutional: Negative.   HENT: Negative.   Eyes: Negative.   Respiratory: Negative.   Cardiovascular: Negative.   Gastrointestinal: Negative.   Genitourinary: Positive for frequency.  Musculoskeletal: Positive for falls and joint pain.       Complains of mid back pain.  Skin: Negative.   Neurological:       Reports hand movements.  Reports boredom. Reports feeling depressed and anxious.  Endo/Heme/Allergies: Negative.   Psychiatric/Behavioral: Positive for depression and suicidal ideas. The patient is nervous/anxious and has insomnia.        Patient reports thoughts of suicide in the past  but no plan.  Reports that she is burden to her family.     Physical Exam  Constitutional: She is oriented to person, place, and time. She appears well-developed and well-nourished.  Eyes:  Wearing glasses  Cardiovascular: Normal rate, regular rhythm, normal heart sounds and intact distal pulses.   Respiratory: Effort normal.  GI: Soft. Bowel sounds are normal.  Musculoskeletal: Normal range of motion. She exhibits no edema.  Ambulating with walker.  Neurological: She is alert and oriented to person, place, and time.  Skin: Skin is warm and dry.  Psychiatric: She has a normal mood and affect. Her behavior is normal. Judgment and thought content normal.  Appears anxious.     Encounter Medications:   Outpatient Encounter Prescriptions as of 12/14/2016  Medication Sig Note  . alendronate (FOSAMAX) 70 MG tablet Take 70 mg by mouth once a week. 12/06/2016: Received from: External  Pharmacy  . Ascorbic Acid (VITAMIN C) 1000 MG tablet Take 1,000 mg by mouth daily.   Marland Kitchen atenolol (TENORMIN) 50 MG tablet TAKE 1 TABLET EVERY DAY   . benazepril (LOTENSIN) 40 MG tablet TAKE 1 TABLET EVERY DAY   . calcium carbonate (OS-CAL) 600 MG TABS Take 600 mg by mouth 2 (two) times daily with a meal.   . carbidopa-levodopa (SINEMET IR) 25-100 MG tablet Take 1 tablet by mouth 3 (three) times daily.   . Cholecalciferol (VITAMIN D3) 1000 UNITS CAPS Take 1 capsule by mouth daily.   Marland Kitchen denosumab (PROLIA) 60 MG/ML SOLN injection Inject 60 mg into the skin every 6 (six) months. Administer in upper arm, thigh, or abdomen   . divalproex (DEPAKOTE SPRINKLE) 125 MG capsule Take 125 mg by mouth 2 (two) times daily. 12/14/2016: Only taking once a day.   . folic acid (FOLVITE) 1 MG tablet Take 1 mg by mouth daily.   Marland Kitchen gabapentin (NEURONTIN) 300 MG capsule Take 300 mg by mouth 3 (three) times daily.   Marland Kitchen guaiFENesin (MUCINEX) 600 MG 12 hr tablet Take by mouth 2 (two) times daily as needed.    Marland Kitchen HYDROcodone-acetaminophen (NORCO/VICODIN) 5-325 MG tablet Take 1 tablet by mouth every 8 (eight) hours as needed for moderate pain.   Marland Kitchen latanoprost (XALATAN) 0.005 % ophthalmic solution Place 1 drop into both eyes at bedtime.    . magnesium hydroxide (MILK OF MAGNESIA) 400 MG/5ML suspension Take 30 mLs by mouth daily as needed for mild constipation.   . meloxicam (MOBIC) 7.5 MG tablet Take 15 mg by mouth daily.    . methotrexate 250 MG/10ML injection Inject 1 mL (25 mg total) into the skin once a week. 09/29/2016: tuesdays  . Multiple Vitamin (MULTIVITAMIN) tablet Take 1 tablet by mouth daily.   . Omega-3 Fatty Acids (FISH OIL) 1200 MG CAPS Take 2 capsules by mouth daily.    Marland Kitchen omeprazole (PRILOSEC) 20 MG capsule TAKE 2 CAPSULES EVERY DAY   . polyethylene glycol (MIRALAX / GLYCOLAX) packet Take 17 g by mouth daily as needed.    . simvastatin (ZOCOR) 40 MG tablet TAKE 1 TABLET EVERY DAY  AT  6PM   . timolol (TIMOPTIC) 0.5  % ophthalmic solution Place 1 drop into both eyes 2 (two) times daily.    Marland Kitchen venlafaxine XR (EFFEXOR-XR) 150 MG 24 hr capsule TAKE 1 CAPSULE EVERY DAY WITH BREAKFAST   . zolpidem (AMBIEN) 5 MG tablet Take 1 tablet (5 mg total) by mouth at bedtime as needed for sleep.   . furosemide (LASIX) 40 MG tablet Take 40 mg by mouth daily. 12/06/2016: Received from: External Pharmacy   No facility-administered encounter medications on file as of 12/14/2016.     Functional Status:   In your present state of health, do you have any difficulty performing the following activities: 12/14/2016 10/22/2016  Hearing? Y N  Vision? N N  Difficulty concentrating or making decisions? Y N  Walking or  climbing stairs? Y Y  Dressing or bathing? Y Y  Doing errands, shopping? Y -  Conservation officer, nature and eating ? Y -  Using the Toilet? Y -  In the past six months, have you accidently leaked urine? Y -  Do you have problems with loss of bowel control? N -  Managing your Medications? Y -  Managing your Finances? Y -  Housekeeping or managing your Housekeeping? Y -  Some recent data might be hidden    Fall/Depression Screening:    PHQ 2/9 Scores 12/14/2016 11/29/2016 11/18/2016 03/16/2016 04/10/2015 06/19/2014  PHQ - 2 Score 6 1 1  0 - 0  PHQ- 9 Score 23 - - - - -  Exception Documentation - - - - Patient refusal -   Fall Risk  12/14/2016 11/18/2016 11/08/2016 09/20/2016 08/11/2016  Falls in the past year? Yes Yes Yes Yes Yes  Number falls in past yr: 2 or more - 2 or more 2 or more 2 or more  Injury with Fall? Yes - Yes Yes Yes  Risk Factor Category  High Fall Risk - High Fall Risk - High Fall Risk  Risk for fall due to : History of fall(s) - - - -  Follow up Falls prevention discussed - Falls evaluation completed Falls evaluation completed Falls evaluation completed   Assessment:   (1) Reviewed [facility] program. Provided [facility] new patient packet. Including [facility] calendar, magnet and my contact card.  Reviewed consent and signed by  patient.  (2) positive depression screening. Reports suicidal thoughts with no plan.   Aggressive behavior reported by family. Husband reports patient has no access to dangerous items.  States he will put sharp knives out of reach.  (3) urinary frequency.  Not a new problem. (4) needs assistance with ADLS and IADLS.  Husband is very tired and has back issues. Needs help in the home.  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  (5) no advanced directives. (6) daughter has question about depakote. Currently patient is taking one a day vs two.   (7) high risk for falls.  Active with home health physical therapy.  (8) no bath aid.  (9) husband struggles with meal preparation due to inability to stand for a long period of time.  Plan:  (1) consent scanned into chart.  (2) will notify MD. Referral placed for [facility] social worker to assist with depression.  Reviewed with patient Eliseo Squires that she could do to keep her mind active and to assist with decreasing boredom.  ( folding clothes, puzzles, coloring, keeping a grocery list) (3)Encoruaged patient to sit on commode for extra time to make sure she is emptying bladder. Encouraged patient and or husband to call MD for any signs of infection.  (4)Referral to [facility] social worker to assist with needs and resources. (5) provided advanced packets and encouraged completion. (6)  Currently being followed by [facility] pharmacy for medication review.  (7) Encouraged patient to do home exercises daily.  Reviewed safety precautions.  (8) will contact [facility] to inquire about pending order. (9) Will ask [facility] social worker to assist with meals on wheels.   Reviewed with patient nursing needs.  Limited needs for disease management or nursing. More needs for care coordination.  Will follow via phone in 1 month to make sure patient and husband are well connected with resources.   Order placed for social work needs.   Will send this note to MD.  Referral completed for [facility] social  worker.   THN CM  Care Plan Problem One   Flowsheet Row Most Recent Value  Care Plan Problem One  Patient needs assistace with ADLS and IADLS  Role Documenting the Problem One  Care Management Coordinator  Care Plan for Problem One  Active  THN Long Term Goal (31-90 days)  Patient will report no falls in the next 31 days.   THN Long Term Goal Start Date  12/14/16  Interventions for Problem One Long Term Goal  Reviewed fall precautions with patient and husband. Encouraged non slip socks, use of walker and daily exercise to improve strength.   THN CM Short Term Goal #1 (0-30 days)  Patient and or husband will report starting of bath aid from home health in the next 2 weeks.   THN CM Short Term Goal #1 Start Date  12/14/16  Interventions for Short Term Goal #1  Will contact [facility] to determine status of bath aid.  Order was placed by primary MD.  Will follow up with patient.   THN CM Short Term Goal #2 (0-30 days)  Patient will report completing advanced directives in the next 2 weeks.   THN CM Short Term Goal #2 Start Date  12/14/16  Interventions for Short Term Goal #2  Provided education on how to complete advanced directives.  Provided 2 copies of advanced directives. Encouraged patient to complete asap.      [name], RN, BSN, CEN [facility] [employer] [phone]

## 2016-12-15 ENCOUNTER — Other Ambulatory Visit: Payer: Self-pay

## 2016-12-15 ENCOUNTER — Other Ambulatory Visit: Payer: Self-pay | Admitting: Pharmacist

## 2016-12-15 NOTE — Patient Outreach (Signed)
Care Coordination: Incoming call from [place] [facility] [place] [facility] who states bath aid services will start later this week and or next week.  Placed call to patient and spoke with husband and informed him that bath aid services will start soon.    In basket message sent to Dr. [name] regarding depression screening results.   PLAN: will continue to coordinate care.  [name], RN, BSN, CEN [facility] [employer] [phone]

## 2016-12-15 NOTE — Patient Outreach (Signed)
[facility]) Care Management  [place]   12/15/2016  Tara Neal Jun 20, 1941 [id number]  Subjective: Tara Neal is a 76 y.o. female referred to pharmacy for medication review. Performed review following RNCM [name] updating medication list at home visit on 12/14/16.  Reviewed patient's allergy, medication and problem lists in order to perform this evaluation.  Following review, outreach call placed to patient's daughter/caregiver, Tara Neal. Per RNCM, patient has given consent for [facility] to contact Tara Neal and Tara Neal has questions regarding some of the patient's medications. Left a HIPAA compliant message on the patient's voicemail. If have not heard back from patient's daughter by 12/17/16, will call again at that time.  12/17/16: Call and speak with Tara Neal, patient's daughter and caregiver. Tara Neal confirms the patient's date of birth. Tara Neal reports that she manages her mother's medications, including filling her weekly pillbox. Reports that she is currently concerned about the management of patient's Depakote, Effexor and Ambien. Reports that patient gets very "wound up" at night. Reports that the patient has been on Effexor XR 150 mg for a while, but that when she was in the nursing home, the physician there decreased her Effexor dose to 75 mg daily and started her on Depakote twice daily. Reports that after patient was discharged, Tara Neal instructed her to increase Effexor back to 150 mg and started her on Ambien for continued agitation at night. Tara Neal reports that she recently stopped the second dose of Depakote in the evening because she was concerned that it was not working well with the Ambien. Reports that Tara Neal is still not sleeping, remains agitated. Counsel Tara Neal to follow up with Tara Neal providers to let them know that she is continuing to have this night time agitation and to request guidance on how to manage the patient's medications. Tara Neal reports that  she is going to follow up with patient's Neurologist, Tara Neal.  Counsel Tara Neal regarding risk of sedation, impairment and increased fall risk with use of zolpidem and Norco. Tara Neal reports that patient is given Norco less than once daily as needed for her pain. Verbalizes understanding of risk. States that patient is high fall risk due to her muscle weakness. Reports that patient is unable to get up on her own and that for this reason patient's husband is by her side at all times. Reports that she walks with him and with a walker whenever she gets up.  Tara Neal denies the patient having any new issues with constipation. Reports that the patient has had constipation for most of her life. Reports that patient's husband gives her Miralax once daily and only uses milk of magnesia as needed.  Tara Neal reports that the patient was scheduled to start on Prolia when the patient was in the nursing home and that she received her first injection on 11/30/16. Reports that she is continuing to take the alendronate once weekly. Let Tara Neal know that I will follow up with Tara Neal regarding whether the patient is to discontinue alendronate as she has started the Prolia.  Reports that patient takes meloxicam once daily for pain. Counsel on the importance of taking meloxicam with food and the potential impact of meloxicam on the patient's blood pressure and methotrexate level. Let Tara Neal know that I will contact patient's rheumatologist to confirm that the provider is aware that the patient is taking this medication. 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 Reports that this mediation gave her control of the symptoms in her hands.  Counsel Tara Neal about the importance of separating patient's Sinemet and multivitamin dosing by 2 hours, as the multivitamin can diminish the levodopa's effect. Tara Neal reports that she will talk to her father about this to ensure  that these doses are spaced out for the patient each day.  Tara Neal reports that she has no further medication questions/concerns on behalf of the patient today. Tara Neal reports that she has saved my phone number in her phone for further questions.  Objective:   Encounter Medications: Outpatient Encounter Prescriptions as of 12/15/2016  Medication Sig Note  . alendronate (FOSAMAX) 70 MG tablet Take 70 mg by mouth once a week. 12/06/2016: Received from: External Pharmacy  . Ascorbic Acid (VITAMIN C) 1000 MG tablet Take 1,000 mg by mouth daily.   Marland Kitchen atenolol (TENORMIN) 50 MG tablet TAKE 1 TABLET EVERY DAY   . benazepril (LOTENSIN) 40 MG tablet TAKE 1 TABLET EVERY DAY   . calcium carbonate (OS-CAL) 600 MG TABS Take 600 mg by mouth 2 (two) times daily with a meal.   . carbidopa-levodopa (SINEMET IR) 25-100 MG tablet Take 1 tablet by mouth 3 (three) times daily.   . Cholecalciferol (VITAMIN D3) 1000 UNITS CAPS Take 1 capsule by mouth daily.   Marland Kitchen denosumab (PROLIA) 60 MG/ML SOLN injection Inject 60 mg into the skin every 6 (six) months. Administer in upper arm, thigh, or abdomen   . divalproex (DEPAKOTE SPRINKLE) 125 MG capsule Take 125 mg by mouth 2 (two) times daily. 12/14/2016: Only taking once a day.   . folic acid (FOLVITE) 1 MG tablet Take 1 mg by mouth daily.   . furosemide (LASIX) 40 MG tablet Take 40 mg by mouth daily. 12/06/2016: Received from: External Pharmacy  . gabapentin (NEURONTIN) 300 MG capsule Take 300 mg by mouth 3 (three) times daily.   Marland Kitchen guaiFENesin (MUCINEX) 600 MG 12 hr tablet Take by mouth 2 (two) times daily as needed.    Marland Kitchen HYDROcodone-acetaminophen (NORCO/VICODIN) 5-325 MG tablet Take 1 tablet by mouth every 8 (eight) hours as needed for moderate pain.   Marland Kitchen latanoprost (XALATAN) 0.005 % ophthalmic solution Place 1 drop into both eyes at bedtime.    . magnesium hydroxide (MILK OF MAGNESIA) 400 MG/5ML suspension Take 30 mLs by mouth daily as needed for mild constipation.   . meloxicam  (MOBIC) 7.5 MG tablet Take 15 mg by mouth daily.    . methotrexate 250 MG/10ML injection Inject 1 mL (25 mg total) into the skin once a week. 09/29/2016: tuesdays  . Multiple Vitamin (MULTIVITAMIN) tablet Take 1 tablet by mouth daily.   . Omega-3 Fatty Acids (FISH OIL) 1200 MG CAPS Take 2 capsules by mouth daily.    Marland Kitchen omeprazole (PRILOSEC) 20 MG capsule TAKE 2 CAPSULES EVERY DAY   . polyethylene glycol (MIRALAX / GLYCOLAX) packet Take 17 g by mouth daily as needed.    . simvastatin (ZOCOR) 40 MG tablet TAKE 1 TABLET EVERY DAY  AT  6PM   . timolol (TIMOPTIC) 0.5 % ophthalmic solution Place 1 drop into both eyes 2 (two) times daily.    Marland Kitchen venlafaxine XR (EFFEXOR-XR) 150 MG 24 hr capsule TAKE 1 CAPSULE EVERY DAY WITH BREAKFAST   . zolpidem (AMBIEN) 5 MG tablet Take 1 tablet (5 mg total) by mouth at bedtime as needed for sleep.    No facility-administered encounter medications on file as of 12/15/2016.     Assessment:  Drugs sorted by system:  Neurologic/Psychologic: carbidopa-levodopa, Depakote, venlafaxine XR, zolpidem  Cardiovascular: atenolol, benazepril, furosemide, fish oil, simvastatin  Pulmonary/Allergy: Mucinex  Gastrointestinal: milk of magnesia, omeprazole, Miralax  Pain: gabapentin, Norco, meloxicam  Vitamins/Minerals: Vitamin C, calcium carbonate, Vitamin D, multivitamin  Miscellaneous: alendronate, Prolia, Xalatan, timolol, folic acid, methotrexate   Duplications in therapy: alendronate and Prolia Gaps in therapy: None noted Medications to avoid in the elderly:  . Norco: Opioids in patient with a history of falls; caregiver counseled . Zolpidem: increases risk of cognitive impairment, unsteady gait, psychomotor impairment, accidents, and delirium; caregiver counseled . Meloxicam: chronic NSAID use due to risk of ulcer/GI bleed/perforation risk; caregiver counseled to have patient take with food  Drug interactions:  . Alendronate + calcium carbonate/multivitamin:  Calcium Salts & multivitamins may decrease the serum concentration of alendronate. Avoid administration of oral calcium supplements and multivitamins within 30 minutes of alendronate . Furosemide + meloxicam: Nonsteroidal Anti-Inflammatory Agents (NSAIDs) may diminish the diuretic effect of furosemide. Furosemide may enhance the nephrotoxic effect of NSAIDs. . Methotrexate + meloxicam: Nonsteroidal Anti-Inflammatory Agents may increase the serum concentration of Methotrexate. If methotrexate and NSAIDs are to be used concomitantly, monitor patients for evidence of hematologic toxicity (frequent CBC), nephrotoxicity (frequent serum creatinine), and hepatotoxicity (LFTs); will contact rheumatology office . Norco + gabapentin + zolpidem: CNS depressants may enhance the CNS depressant effect of other CNS depressant; caregiver counseled . carbidopa-levodopa + multivitamin: Multivitamins may diminish the therapeutic effect of levodopa. Administration of an iron-containing multivitamin and levodopa should be separated by at least 2 hours to minimize the impact of this potential interaction; caregiver counseled  Other issues noted:   Caregiver reporting that patient continues to have increased agitation in the evening.  Plan:  1) Tara Neal, caregiver, to follow up with Tara Neal providers to let them know that she is continuing to have night time agitation and to request guidance on how to manage the patient's medications.  2) Will call to follow up with Tara Neal regarding whether the patient is to discontinue taking alendronate as she has started the Prolia.  3) Will call to follow up with patient's Rheumatologist to confirm that he is aware that the patient is taking meloxicam daily, as meloxicam may be increasing the serum concentration of methotrexate/increasing her risk of methotrexate toxicity.  Harlow Asa, PharmD, [place]  [facility] [phone]

## 2016-12-15 NOTE — Patient Outreach (Signed)
Care Coordination: Placed call to [facility] and left message with answering service regarding order for bath aid.    PLAN: Awaiting a call back. Will update patient when I hear back from home health.  [name], RN, BSN, CEN [facility] [employer] [phone]

## 2016-12-17 ENCOUNTER — Other Ambulatory Visit: Payer: Self-pay | Admitting: Pharmacist

## 2016-12-17 NOTE — Patient Outreach (Signed)
Leave a message on the voicemail of Dr. Gwynn Burly nurse regarding whether the patient is to discontinue taking alendronate as she has started the Prolia.  If have not heard back from Dr. Gwynn Burly office by 12/20/16, will follow up again at that time.  Harlow Asa, PharmD, [place] [facility] [phone]

## 2016-12-20 DIAGNOSIS — J449 Chronic obstructive pulmonary disease, unspecified: Secondary | ICD-10-CM | POA: Diagnosis not present

## 2016-12-20 DIAGNOSIS — E1122 Type 2 diabetes mellitus with diabetic chronic kidney disease: Secondary | ICD-10-CM | POA: Diagnosis not present

## 2016-12-20 DIAGNOSIS — N181 Chronic kidney disease, stage 1: Secondary | ICD-10-CM | POA: Diagnosis not present

## 2016-12-20 DIAGNOSIS — E1140 Type 2 diabetes mellitus with diabetic neuropathy, unspecified: Secondary | ICD-10-CM | POA: Diagnosis not present

## 2016-12-20 DIAGNOSIS — G20 Parkinson's disease: Secondary | ICD-10-CM | POA: Diagnosis not present

## 2016-12-20 DIAGNOSIS — M1990 Unspecified osteoarthritis, unspecified site: Secondary | ICD-10-CM | POA: Diagnosis not present

## 2016-12-21 ENCOUNTER — Telehealth: Payer: Self-pay | Admitting: Neurology

## 2016-12-21 NOTE — Telephone Encounter (Signed)
Spoke with daughter and made her aware.

## 2016-12-21 NOTE — Telephone Encounter (Signed)
I can't find where you have ever written this medication.  Is this something you would prescribe?

## 2016-12-21 NOTE — Telephone Encounter (Signed)
I didn't prescribe that.  I noted in my last note that she was started on it and it helped but I wasn't the prescriber.

## 2016-12-21 NOTE — Telephone Encounter (Signed)
Tara Neal 09/08/1941. Her # [phone]( daughter) Threasa Beards  called. She is home from the nursing home now. The prescription she was given in the nursing home has ran out. The medication is Depakote 125 mg 2 x a day. She uses [facility] on [address]. Then mail order thru [facility]. She was wanting to see if Dr. Carles Collet could refill this medication? Thank you

## 2016-12-22 ENCOUNTER — Other Ambulatory Visit: Payer: Self-pay | Admitting: Pharmacist

## 2016-12-22 ENCOUNTER — Telehealth: Payer: Self-pay | Admitting: Emergency Medicine

## 2016-12-22 DIAGNOSIS — M1990 Unspecified osteoarthritis, unspecified site: Secondary | ICD-10-CM | POA: Diagnosis not present

## 2016-12-22 DIAGNOSIS — E1122 Type 2 diabetes mellitus with diabetic chronic kidney disease: Secondary | ICD-10-CM | POA: Diagnosis not present

## 2016-12-22 DIAGNOSIS — G20 Parkinson's disease: Secondary | ICD-10-CM | POA: Diagnosis not present

## 2016-12-22 DIAGNOSIS — N181 Chronic kidney disease, stage 1: Secondary | ICD-10-CM | POA: Diagnosis not present

## 2016-12-22 DIAGNOSIS — E1140 Type 2 diabetes mellitus with diabetic neuropathy, unspecified: Secondary | ICD-10-CM | POA: Diagnosis not present

## 2016-12-22 DIAGNOSIS — J449 Chronic obstructive pulmonary disease, unspecified: Secondary | ICD-10-CM | POA: Diagnosis not present

## 2016-12-22 NOTE — Telephone Encounter (Signed)
Yes, ok to stop the fosamax

## 2016-12-22 NOTE — Patient Outreach (Signed)
Call and leave a message on the voicemail of Dr. Melissa Noon nurse to confirm that he is aware that the patient is taking meloxicam daily, as meloxicam may increase the serum concentration of methotrexate/increase need for monitoring for risk of methotrexate toxicity.  Harlow Asa, PharmD, [place] [facility] [phone]

## 2016-12-22 NOTE — Telephone Encounter (Signed)
Grayland Ormond called regarding whether the patient is to discontinue taking alendronate as she has started the Prolia. Please give her a call back thanks.  Harlow Asa, PharmD, [place] [facility] [phone]

## 2016-12-22 NOTE — Telephone Encounter (Signed)
Advisement given

## 2016-12-22 NOTE — Patient Outreach (Signed)
Leave a follow up message with Roselyn Reef in Dr. Gwynn Burly office regarding message that I left on the voicemail of Dr. Gwynn Burly nurse on 12/17/16 regarding whether the patient is to discontinue taking alendronate as she has started the Prolia.  If have not heard back from Dr. Gwynn Burly office by 12/24/16, will follow up again at that time.  Harlow Asa, PharmD, [place] [facility] [phone]

## 2016-12-23 ENCOUNTER — Other Ambulatory Visit: Payer: Self-pay | Admitting: *Deleted

## 2016-12-23 NOTE — Patient Outreach (Signed)
[facility]) Care Management  12/23/2016  SYRINA LONSDALE January 21, 1941 [id number]  CSW was able to make initial contact with patient's daughter, [name], today, to perform phone assessment, as well as assess and assist with social work needs and services.  CSW introduced self, explained role and types of services provided through [place] [facility] ([place] [facility]).  CSW further explained to patient that CSW works with patient's RNCM, also with [place] [facility], . CSW then explained the reason for the call, indicating that patient's RNCM thought that patient would benefit from social work services and resources to assist with .  CSW obtained two HIPAA compliant identifiers from patient, which included patient's name and date of birth.   Eduard Clos, MSW, [place] Worker  [place] [phone]

## 2016-12-24 ENCOUNTER — Other Ambulatory Visit: Payer: Self-pay | Admitting: Pharmacist

## 2016-12-24 NOTE — Patient Outreach (Signed)
Call to follow up with patient's PCP regarding whether the patient is to discontinue taking alendronate as she has started the Prolia. Note that per EPIC chart review, Dr. [name] replied on 12/22/16, "Yes, ok to stop the fosamax". Call to confirm that patient has been contacted and notified. Speak with Dr. Gwynn Burly assistant who states that the patient has been contacted.  Will close pharmacy episode at this time.  Harlow Asa, PharmD, [place] [facility] [phone]

## 2016-12-26 ENCOUNTER — Emergency Department (HOSPITAL_COMMUNITY): Payer: Medicare Other

## 2016-12-26 ENCOUNTER — Emergency Department (HOSPITAL_COMMUNITY)
Admission: EM | Admit: 2016-12-26 | Discharge: 2016-12-27 | Disposition: A | Discharge status: Home / Self Care | Payer: Medicare Other | Attending: Emergency Medicine | Admitting: Emergency Medicine

## 2016-12-26 ENCOUNTER — Encounter (HOSPITAL_COMMUNITY): Payer: Self-pay

## 2016-12-26 DIAGNOSIS — Z87891 Personal history of nicotine dependence: Secondary | ICD-10-CM | POA: Insufficient documentation

## 2016-12-26 DIAGNOSIS — F0390 Unspecified dementia without behavioral disturbance: Secondary | ICD-10-CM | POA: Diagnosis not present

## 2016-12-26 DIAGNOSIS — R404 Transient alteration of awareness: Secondary | ICD-10-CM | POA: Diagnosis not present

## 2016-12-26 DIAGNOSIS — R531 Weakness: Secondary | ICD-10-CM | POA: Diagnosis not present

## 2016-12-26 DIAGNOSIS — R4182 Altered mental status, unspecified: Secondary | ICD-10-CM | POA: Diagnosis not present

## 2016-12-26 DIAGNOSIS — R410 Disorientation, unspecified: Secondary | ICD-10-CM | POA: Insufficient documentation

## 2016-12-26 DIAGNOSIS — I10 Essential (primary) hypertension: Secondary | ICD-10-CM | POA: Insufficient documentation

## 2016-12-26 DIAGNOSIS — J449 Chronic obstructive pulmonary disease, unspecified: Secondary | ICD-10-CM | POA: Diagnosis not present

## 2016-12-26 DIAGNOSIS — M546 Pain in thoracic spine: Secondary | ICD-10-CM | POA: Diagnosis not present

## 2016-12-26 DIAGNOSIS — E119 Type 2 diabetes mellitus without complications: Secondary | ICD-10-CM | POA: Diagnosis not present

## 2016-12-26 LAB — URINALYSIS, ROUTINE W REFLEX MICROSCOPIC
Bilirubin Urine: NEGATIVE
Glucose, UA: NEGATIVE mg/dL
Hgb urine dipstick: NEGATIVE
Ketones, ur: 5 mg/dL — AB
Nitrite: NEGATIVE
PROTEIN: NEGATIVE mg/dL
SPECIFIC GRAVITY, URINE: 1.002 — AB (ref 1.005–1.030)
pH: 7 (ref 5.0–8.0)

## 2016-12-26 LAB — COMPREHENSIVE METABOLIC PANEL
ALK PHOS: 61 U/L (ref 38–126)
ALT: 8 U/L — AB (ref 14–54)
AST: 24 U/L (ref 15–41)
Albumin: 4.1 g/dL (ref 3.5–5.0)
Anion gap: 9 (ref 5–15)
BILIRUBIN TOTAL: 0.8 mg/dL (ref 0.3–1.2)
BUN: 11 mg/dL (ref 6–20)
CALCIUM: 8.6 mg/dL — AB (ref 8.9–10.3)
CHLORIDE: 102 mmol/L (ref 101–111)
CO2: 26 mmol/L (ref 22–32)
CREATININE: 0.77 mg/dL (ref 0.44–1.00)
GFR calc Af Amer: >60 mL/min (ref >60)
Glucose, Bld: 100 mg/dL — ABNORMAL HIGH (ref 65–99)
Potassium: 3.4 mmol/L — ABNORMAL LOW (ref 3.5–5.1)
Sodium: 137 mmol/L (ref 135–145)
TOTAL PROTEIN: 6.9 g/dL (ref 6.5–8.1)

## 2016-12-26 LAB — CBC WITH DIFFERENTIAL/PLATELET
BASOS ABS: 0 10*3/uL (ref 0.0–0.1)
Basophils Relative: 1 %
Eosinophils Absolute: 0.1 10*3/uL (ref 0.0–0.7)
Eosinophils Relative: 1 %
HEMATOCRIT: 37.6 % (ref 36.0–46.0)
HEMOGLOBIN: 12.7 g/dL (ref 12.0–15.0)
LYMPHS ABS: 2.6 10*3/uL (ref 0.7–4.0)
LYMPHS PCT: 32 %
MCH: 32 pg (ref 26.0–34.0)
MCHC: 33.8 g/dL (ref 30.0–36.0)
MCV: 94.7 fL (ref 78.0–100.0)
Monocytes Absolute: 0.7 10*3/uL (ref 0.1–1.0)
Monocytes Relative: 9 %
NEUTROS ABS: 4.6 10*3/uL (ref 1.7–7.7)
Neutrophils Relative %: 57 %
Platelets: 243 10*3/uL (ref 150–400)
RBC: 3.97 MIL/uL (ref 3.87–5.11)
RDW: 13.2 % (ref 11.5–15.5)
WBC: 8 10*3/uL (ref 4.0–10.5)

## 2016-12-26 LAB — CBG MONITORING, ED: Glucose-Capillary: 90 mg/dL (ref 65–99)

## 2016-12-26 LAB — I-STAT TROPONIN, ED: TROPONIN I, POC: 0 ng/mL (ref 0.00–0.08)

## 2016-12-26 NOTE — ED Triage Notes (Signed)
Her husband, who is with her tells [place] pt. Always needs assistance ambulating d/t Parkinson's dis.; but today was actually unable to ambulate. He further tells [place] pt. Has had urinary frequency. Pt. Is alert and attentive and slow but appropriate with her verbal responses; which her husband states is "normal for her".

## 2016-12-26 NOTE — ED Notes (Signed)
Pt transported to Radiology 

## 2016-12-26 NOTE — ED Notes (Signed)
ED Provider at bedside. 

## 2016-12-26 NOTE — ED Notes (Signed)
Bed: [id number] Expected date:  Expected time:  Means of arrival:  Comments: 76 yo F urinary retention

## 2016-12-26 NOTE — ED Provider Notes (Signed)
[facility] DEPT Provider Note   CSN: [id number] Arrival date & time: 12/26/16  1856  By signing my name below, I, Oleh Genin, attest that this documentation has been prepared under the direction and in the presence of Carlisle Cater, [place]. Electronically Signed: Oleh Genin, Scribe. 12/26/16. 9:02 PM.   History   Chief Complaint Chief Complaint  Patient presents with  . Urinary Frequency  . Weakness   LEVEL 5 CAVEAT DUE TO DEMENTIA  HPI Tara Neal is a 76 y.o. female with history of Parkinson's disease who presents to the ED for evaluation of urinary urgency and changes in mental status. History is obtained from family members who are at interview. They state that in the last 2 days the patient has developed urinary urgency requiring family to help her to the bathroom "10 or 15 times" today. The family also states that typically the patient is able to ambulate with a walker with assistance, however in the last 2 days she has needed a significant amount of help to prevent falling. She has chronic back pain and family states that she has been complaining more about this recently. The family believes that her mental status has "deteriorated" over a period of weeks. They state that at times she "stares off into space" and other times does not know her age. She occasionally becomes agitated. She did receive an MRI brain in 2016 for similar concerns which was negative for stroke.   The history is provided by a relative. No language interpreter was used.    Past Medical History:  Diagnosis Date  . Allergy   . Anemia    hx of   . Anxiety   . Chronic insomnia 04/22/2012  . COPD (chronic obstructive pulmonary disease) ([place])   . Diverticulosis of colon   . GERD (gastroesophageal reflux disease)   . Glaucoma   . Hormone replacement therapy (postmenopausal)   . Hyperlipidemia   . Hypertension   . Low back pain   . Migraine headache    denies  . Mild obesity   .  Osteoarthritis   . Osteopenia   . Osteoporosis   . Pneumonia    hx of 5 years ago   . Scoliosis   . Shortness of breath    with exertion   . Spontaneous pneumothorax    1968  . Umbilical hernia [id number]    Patient Active Problem List   Diagnosis Date Noted  . 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 Depression 11/20/2016  . Dementia without behavioral disturbance 11/20/2016  . Dehydration 10/02/2016  . Thoracic compression fracture ([place]) 09/30/2016  . Acute diarrhea 09/30/2016  . Acute cystitis without hematuria   . Fall   . Urinary retention 07/08/2016  . UTI (lower urinary tract infection) 05/15/2016  . Bilateral hand pain 06/05/2015  . Gait disorder 04/15/2015  . Orthostatic hypotension 04/10/2015  . Recurrent falls while walking 04/10/2015  . Memory loss 04/10/2015  . Abnormal x-ray 02/20/2015  . Cough 11/18/2014  . Thrush, oral 04/04/2014  . Umbilical hernia 0000000  . Benign skin lesion 07/24/2013  . Anemia, unspecified 12/21/2012  . Insomnia 04/22/2012  . Right hip pain 12/13/2011  . Right knee pain 12/13/2011  . Preventative health care 12/11/2011  . DIVERTICULOSIS, COLON 11/18/2010  . ARTERIOVENOUS MALFORMATION, COLON 11/18/2010  . ANXIETY 12/02/2009  . COPD (chronic obstructive pulmonary disease) ([place]) 05/27/2009  . Diabetes ([place]) 02/06/2009  . FATIGUE 12/12/2008  . Hyperlipidemia 10/01/2007  . OBESITY, MILD 10/01/2007  .  MIGRAINE HEADACHE 10/01/2007  . Essential hypertension 10/01/2007  . 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 Pneumothorax 10/01/2007  . GERD 10/01/2007  . Osteoarthritis 10/01/2007  . LOW BACK PAIN 10/01/2007  . OSTEOPOROSIS 10/01/2007    Past Surgical History:  Procedure Laterality Date  . EXCISION/RELEASE BURSA HIP  04/26/2012   Procedure: EXCISION/RELEASE BURSA HIP;  Surgeon: Gearlean Alf, MD;  Location: [facility];  Service: Orthopedics;  Laterality: Right;  . EYE SURGERY     -  bil cataract surg and Retina surgery to left eye  . HERNIA REPAIR  [id number]   umbilical hernia  . HIP ADDUCTOR TENOTOMY    . IR GENERIC HISTORICAL  06/17/2016   IR RADIOLOGIST EVAL & MGMT 06/17/2016 [facility]  . IR GENERIC HISTORICAL  07/01/2016   IR VERTEBROPLASTY LUMBAR BX INC UNI/BIL INC/INJECT/IMAGING 07/01/2016 Luanne Bras, MD [facility]  . IR GENERIC HISTORICAL  07/16/2016   IR RADIOLOGIST EVAL & MGMT 07/16/2016 [facility]  . IR GENERIC HISTORICAL  10/22/2016   IR VERTEBROPLASTY CERV/THOR BX INC UNI/BIL INC/INJECT/IMAGING 10/22/2016 Luanne Bras, MD [facility]  . IR GENERIC HISTORICAL  10/22/2016   IR VERTEBROPLASTY EA ADDL (T&LS) BX INC UNI/BIL INC INJECT/IMAGING 10/22/2016 Luanne Bras, MD [facility]  . LUMBAR DISC SURGERY      x 2  . OTHER SURGICAL HISTORY     benign growth removed from right under ear at age 69   . SHOULDER ARTHROSCOPY     left shoulder  . TONSILLECTOMY    . TONSILLECTOMY    . WRIST FRACTURE SURGERY     plate in left wrist/ fractured 2 times    OB History    No data available       Home Medications    Prior to Admission medications   Medication Sig Start Date End Date Taking? Authorizing Provider  alendronate (FOSAMAX) 70 MG tablet Take 70 mg by mouth once a week. 11/24/16   Historical Provider, MD  Ascorbic Acid (VITAMIN C) 1000 MG tablet Take 1,000 mg by mouth daily.    Historical Provider, MD  atenolol (TENORMIN) 50 MG tablet TAKE 1 TABLET EVERY DAY 03/15/16   Biagio Borg, MD  benazepril (LOTENSIN) 40 MG tablet TAKE 1 TABLET EVERY DAY 03/15/16   Biagio Borg, MD  calcium carbonate (OS-CAL) 600 MG TABS Take 600 mg by mouth 2 (two) times daily with a meal.    Historical Provider, MD  carbidopa-levodopa (SINEMET IR) 25-100 MG tablet Take 1 tablet by mouth 3 (three) times daily. 09/20/16   Eustace Quail Tat, DO  Cholecalciferol (VITAMIN D3) 1000 UNITS CAPS Take 1 capsule by mouth daily.    Historical Provider, MD  denosumab  (PROLIA) 60 MG/ML SOLN injection Inject 60 mg into the skin every 6 (six) months. Administer in upper arm, thigh, or abdomen    Historical Provider, MD  divalproex (DEPAKOTE SPRINKLE) 125 MG capsule Take 125 mg by mouth 2 (two) times daily.    Historical Provider, MD  folic acid (FOLVITE) 1 MG tablet Take 1 mg by mouth daily.    Historical Provider, MD  furosemide (LASIX) 40 MG tablet Take 40 mg by mouth daily. 11/24/16   Historical Provider, MD  gabapentin (NEURONTIN) 300 MG capsule Take 300 mg by mouth 3 (three) times daily.    Historical Provider, MD  guaiFENesin (MUCINEX) 600 MG 12 hr tablet Take by mouth 2 (two) times daily as needed.     Historical Provider, MD  HYDROcodone-acetaminophen (NORCO/VICODIN) 5-325 MG tablet Take 1 tablet by mouth every 8 (eight) hours as needed for moderate pain. 10/02/16   Robbie Lis, MD  latanoprost (XALATAN) 0.005 % ophthalmic solution Place 1 drop into both eyes at bedtime.  10/09/13   Historical Provider, MD  magnesium hydroxide (MILK OF MAGNESIA) 400 MG/5ML suspension Take 30 mLs by mouth daily as needed for mild constipation.    Historical Provider, MD  meloxicam (MOBIC) 7.5 MG tablet Take 15 mg by mouth daily.  07/19/16   Historical Provider, MD  methotrexate 250 MG/10ML injection Inject 1 mL (25 mg total) into the skin once a week. 05/19/16   Velvet Bathe, MD  Multiple Vitamin (MULTIVITAMIN) tablet Take 1 tablet by mouth daily.    Historical Provider, MD  Omega-3 Fatty Acids (FISH OIL) 1200 MG CAPS Take 2 capsules by mouth daily.     Historical Provider, MD  omeprazole (PRILOSEC) 20 MG capsule TAKE 2 CAPSULES EVERY DAY 03/15/16   Biagio Borg, MD  polyethylene glycol [facility] Floria Raveling) packet Take 17 g by mouth daily as needed.     Historical Provider, MD  simvastatin (ZOCOR) 40 MG tablet TAKE 1 TABLET EVERY DAY  AT  [facility] 03/15/16   Biagio Borg, MD  timolol (TIMOPTIC) 0.5 % ophthalmic solution Place 1 drop into both eyes 2 (two) times daily.     Historical  Provider, MD  venlafaxine XR (EFFEXOR-XR) 150 MG 24 hr capsule TAKE 1 CAPSULE EVERY DAY WITH BREAKFAST 11/18/16   Biagio Borg, MD  zolpidem (AMBIEN) 5 MG tablet Take 1 tablet (5 mg total) by mouth at bedtime as needed for sleep. 11/18/16   Biagio Borg, MD    Family History Family History  Problem Relation Age of Onset  . Hyperlipidemia Mother   . Heart disease Mother   . Diabetes Mother     Social History Social History  Substance Use Topics  . Smoking status: Former Smoker    Years: 20.00    Types: Cigarettes    Quit date: 11/29/1986  . Smokeless tobacco: Never Used  . Alcohol use No     Comment: one glass of wine every 3 months     Allergies   Codeine; Sulfonamide derivatives; Penicillins; and Percocet [oxycodone-acetaminophen]   Review of Systems Review of Systems  Unable to perform ROS: Dementia     Physical Exam Updated Vital Signs BP 172/84 (BP Location: Left Arm)   Pulse 73   Temp 99.1 F (37.3 C) (Oral)   Resp 20   SpO2 98%   Physical Exam  Constitutional: She appears well-developed and well-nourished.  HENT:  Head: Normocephalic and atraumatic.  Right Ear: Tympanic membrane, external ear and ear canal normal.  Left Ear: Tympanic membrane, external ear and ear canal normal.  Nose: Nose normal. No mucosal edema or rhinorrhea.  Mouth/Throat: Uvula is midline, oropharynx is clear and moist and mucous membranes are normal. Mucous membranes are not dry. No oral lesions. No trismus in the jaw. No uvula swelling. No oropharyngeal exudate, posterior oropharyngeal edema, posterior oropharyngeal erythema or tonsillar abscesses.  Eyes: Conjunctivae are normal. Right eye exhibits no discharge. Left eye exhibits no discharge.  Neck: Normal range of motion. Neck supple.  Cardiovascular: Normal rate, regular rhythm and normal heart sounds.   Pulmonary/Chest: Effort normal and breath sounds normal. No respiratory distress. She has no wheezes. She has no rales.    Abdominal: Soft. There is no tenderness.  Lymphadenopathy:    She  has no cervical adenopathy.  Neurological: She is alert. She exhibits normal muscle tone.  Moves all 4 extremities purposefully. Only states "I want to go home" repeatedly. No focal neuro deficits. Exam is limited due to dementia and disorientation.   Skin: Skin is warm and dry.  Psychiatric: She has a normal mood and affect.  Nursing note and vitals reviewed.    ED Treatments / Results  DIAGNOSTIC STUDIES: Oxygen Saturation is 98 percent on room air which is normal by my interpretation.    COORDINATION OF CARE: 9:01 PM Discussed treatment plan with the patient's family which include a urinalysis, basic lab work, as well as a CT head and plain films of the chest. The family agree with this plan.  Labs (all labs ordered are listed, but only abnormal results are displayed) Labs Reviewed  URINALYSIS, ROUTINE W REFLEX MICROSCOPIC - Abnormal; Notable for the following:       Result Value   Color, Urine STRAW (*)    Specific Gravity, Urine 1.002 (*)    Ketones, ur 5 (*)    Leukocytes, UA TRACE (*)    Bacteria, UA RARE (*)    Squamous Epithelial / LPF 0-5 (*)    All other components within normal limits  COMPREHENSIVE METABOLIC PANEL - Abnormal; Notable for the following:    Potassium 3.4 (*)    Glucose, Bld 100 (*)    Calcium 8.6 (*)    ALT 8 (*)    All other components within normal limits  CBC WITH DIFFERENTIAL/PLATELET  CBG MONITORING, ED  CBG MONITORING, ED  Randolm Idol, ED    ED ECG REPORT   Date: 12/27/2016  Rate: 72  Rhythm: irregular  QRS Axis: normal  Intervals: normal  ST/T Wave abnormalities: normal  Conduction Disutrbances:none  Narrative Interpretation:   Old EKG Reviewed: unchanged  I have personally reviewed the EKG tracing and agree with the computerized printout as noted.  Radiology Dg Chest 2 View  Result Date: 12/26/2016 CLINICAL DATA:  Worsening right-sided weakness.  Worsening back pain. EXAM: CHEST  2 VIEW COMPARISON:  Chest radiographs 09/29/2016. Performed concurrently with dedicated thoracic spine radiographs. FINDINGS: Unchanged heart size and mediastinal contours with tortuosity and atherosclerosis of the thoracic aorta. No pulmonary edema or focal airspace disease. No pleural fluid or pneumothorax. There is eventration of the right hemidiaphragm containing air filled colon. Kyphoplasty within T11 and T8 compression fractures. IMPRESSION: Unchanged appearance of the chest. No acute pulmonary process. Stable aortic tortuosity and atherosclerosis. Electronically Signed   By: Jeb Levering M.D.   On: 12/26/2016 21:44   Dg Thoracic Spine 2 View  Result Date: 12/26/2016 CLINICAL DATA:  Worsening back pain. EXAM: THORACIC SPINE 2 VIEWS COMPARISON:  Thoracic spine CT and MRI 09/29/2016 FINDINGS: Kyphoplasty within T10 and T11 compression fractures since prior exam. No evidence of new compression fracture. Upper thoracic spine not well visualized due to overlapping osseous and soft tissue structures. Degenerative disc disease most prominent in the lower thoracic spine. Degenerative change in scoliosis of the visualized lumbar spine is partially included. IMPRESSION: 1. Kyphoplasty within T10 and T11 compression fractures, new since most recent prior imaging. 2. No acute abnormality in the thoracic spine is seen radiographically. Electronically Signed   By: Jeb Levering M.D.   On: 12/26/2016 21:47   Ct Head Wo Contrast  Result Date: 12/26/2016 CLINICAL DATA:  Altered mental status with urinary frequency. Difficulty ambulating. EXAM: CT HEAD WITHOUT CONTRAST TECHNIQUE: Contiguous axial images were obtained from the base  of the skull through the vertex without intravenous contrast. COMPARISON:  MRI brain 08/18/2016 FINDINGS: Brain: Ventricles, cisterns and other CSF spaces are within normal. There is no mass, mass effect, shift of midline structures or acute  hemorrhage. No evidence of acute infarction. There is evidence of chronic ischemic microvascular disease. Vascular: Mild calcified plaque over the cavernous segment of the internal carotid arteries. Skull: Within normal Sinuses/Orbits: Within normal. Other: None. IMPRESSION: No acute intracranial findings. Chronic ischemic microvascular disease. Electronically Signed   By: Marin Olp M.D.   On: 12/26/2016 21:33    Procedures Procedures (including critical care time)  Medications Ordered in ED Medications - No data to display   Initial Impression / Assessment and Plan / ED Course  I have reviewed the triage vital signs and the nursing notes.  Pertinent labs & imaging results that were available during my care of the patient were reviewed by me and considered in my medical decision making (see chart for details).     Patient seen and discussed with Dr. Vanita Panda. Patient does not have any acute medical conditions requiring admission at this time. Family is unable to care for patient and she is not safe to go home. Will need SW consult in AM. Plan to hold in ED.   Final Clinical Impressions(s) / ED Diagnoses   Final diagnoses:  Confusion  Generalized weakness   Pending SW consult in AM.   New Prescriptions New Prescriptions   No medications on file     Carlisle Cater, PA-C 12/27/16 [place], MD 12/27/16 [phone]

## 2016-12-27 DIAGNOSIS — E1140 Type 2 diabetes mellitus with diabetic neuropathy, unspecified: Secondary | ICD-10-CM | POA: Diagnosis not present

## 2016-12-27 DIAGNOSIS — G20 Parkinson's disease: Secondary | ICD-10-CM | POA: Diagnosis not present

## 2016-12-27 DIAGNOSIS — N181 Chronic kidney disease, stage 1: Secondary | ICD-10-CM | POA: Diagnosis not present

## 2016-12-27 DIAGNOSIS — E1122 Type 2 diabetes mellitus with diabetic chronic kidney disease: Secondary | ICD-10-CM | POA: Diagnosis not present

## 2016-12-27 DIAGNOSIS — M1990 Unspecified osteoarthritis, unspecified site: Secondary | ICD-10-CM | POA: Diagnosis not present

## 2016-12-27 DIAGNOSIS — J449 Chronic obstructive pulmonary disease, unspecified: Secondary | ICD-10-CM | POA: Diagnosis not present

## 2016-12-27 DIAGNOSIS — R531 Weakness: Secondary | ICD-10-CM | POA: Diagnosis not present

## 2016-12-27 MED ORDER — PHENAZOPYRIDINE HCL 100 MG PO TABS
100.0000 mg | ORAL_TABLET | Freq: Once | ORAL | Status: AC
  Administered 2016-12-27: 100 mg via ORAL
  Filled 2016-12-27: qty 1

## 2016-12-27 MED ORDER — ZOLPIDEM TARTRATE 5 MG PO TABS
5.0000 mg | ORAL_TABLET | Freq: Once | ORAL | Status: AC
  Administered 2016-12-27: 5 mg via ORAL
  Filled 2016-12-27: qty 1

## 2016-12-27 NOTE — Progress Notes (Signed)
Called in referral to [place] [facility] for additional hh aide Left CM mobile number

## 2016-12-27 NOTE — ED Notes (Signed)
Pt assisted to bedside commode yet pt unable to urinate. Pt placed back in bed and call bell in reach. Family at bedside.

## 2016-12-27 NOTE — Progress Notes (Addendum)
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 Mr Tara Neal, husband is primary caregiver but has medical issue of his own - on Friday December 24 2106 his son in law took him in to get a TENS unit placed.  There is a son, Tara Neal living in [place] but he has been able to only provide limited support Tara Neal son in law is disabled with hx of back and other surgeries  Covering ED SW updated

## 2016-12-27 NOTE — Progress Notes (Signed)
ED CM left a voice message for Oswaldo Milian [facility] SW [phone] to discussed CM assessment collateral informal information obtained while pt at [facility] ED - Interest in [facility] snf, pt dementia with screaming, etc when snf mentioned, husband job taking care of her is now tasking on him, choice connections mentioned, additional [place] aide added, aware of upcoming visit from [facility] SW on Wednesday per family and assistance needed from community level for possible competency check and facilitiy placement Left CM mobile #

## 2016-12-27 NOTE — Progress Notes (Signed)
ED CM called by SW to inquire if pt able to pay privately for placement  CM assisted with transfer of SW to pt room with ED Unit secretary assistance

## 2016-12-27 NOTE — ED Notes (Signed)
No respiratory or acute distress noted resting in bed with eyes closed call light in reach. 

## 2016-12-27 NOTE — ED Notes (Signed)
Case Manager Kim at bedside

## 2016-12-27 NOTE — ED Notes (Signed)
Checked pt for wetness and clean attends pad noted.

## 2016-12-27 NOTE — Progress Notes (Signed)
   12/26/16 0000  CM Assessment  Expected Discharge [place]  In-house Referral Clinical Social Work  Discharge Planning Services CM Consult  [facility]  Choice offered to / list presented to  Spouse;Adult Children  DME Arranged N/A  DME [place] (now [facility])  [place] PT;[facility]urse[facility] [place] (now [facility])  Status of Service Completed, signed off  Discharge Disposition Home w [place]   CM consulted by covering ED SW after their assessment Pt not meeting criteria for admission and unable to pt private pay for facility placement CM spoke with pt, pt's spouse, clyde, her daughter, Threasa Beards [id number]) and son in law, Carolyn Stare ED RN and ED PA updated  Pt already has [facility] SW and RN  and [facility] service PT [facility] and [place] SW to visit on Wednesday December 29 2016 [place], husband only interested in [place] because it is close to the home  They have spoken with Nira Conn at [employer] who states she would assist when family and pt is ready for placement Cm provided daughter and son in law with medicaid website to initiate medicaid application although pt has medicare and [place] coverage Reports services started after pt d/c from snf in December 2017 [name] daughter states pt has dementia an PSP and screams and tantrums when snf mentioned CM offered to assist with adding aide to home health services, speak with [facility], [facility] and [facility] staff  Discussed options of placement from community following medicare guidelines with use of [facility], Pcp, [facility] and [facility] staff Family voiced understanding Pt primarily nodding her head during interaction but understood and followed directions on straightening her arm out when her monitor indicated increased sbp of 162 Pt smiled and put arm down Pt prefers husband at bedside at all times CM spoke with [name] and  Simona Huh about snf resources in hall way outside of pt room as not to cause her to get agitated

## 2016-12-27 NOTE — Discharge Instructions (Signed)
Please read and follow all provided instructions.  Your diagnoses today include:  1. Confusion   2. Generalized weakness     Tests performed today include: Vital signs. See below for your results today.   Medications prescribed:  Take as prescribed   Home care instructions:  Follow any educational materials contained in this packet.  Follow-up instructions: Please follow-up with your primary care provider for further evaluation of symptoms and treatment   Return instructions:  Please return to the Emergency Department if you do not get better, if you get worse, or new symptoms OR  - Fever (temperature greater than 101.62F)  - Bleeding that does not stop with holding pressure to the area    -Severe pain (please note that you may be more sore the day after your accident)  - Chest Pain  - Difficulty breathing  - Severe nausea or vomiting  - Inability to tolerate food and liquids  - Passing out  - Skin becoming red around your wounds  - Change in mental status (confusion or lethargy)  - New numbness or weakness    Please return if you have any other emergent concerns.  Additional Information:  Your vital signs today were: BP 159/88 (BP Location: Left Arm)    Pulse 79    Temp 99.1 F (37.3 C) (Oral)    Resp 18    SpO2 98%  If your blood pressure (BP) was elevated above 135/85 this visit, please have this repeated by your doctor within one month. ---------------

## 2016-12-27 NOTE — Progress Notes (Signed)
CSW engaged with Patient's spouse [facility]) and Patient's daughter [name] [place]) via T/C. CSW explained that Patient has Medicare insurance which requires a qualifying 3 day inpatient hospitalization for Medicare coverage. CSW inquired about paying privately for a facility. Patient's family reports that they are aware of the Medicare coverage requirements and are unable to pay privately at this time. Patient's family agreeable to take Patient home at this time. CSW encouraged Patient's family to go to the Department of Social Services to apply for Medicaid as soon as possible for assistance with long term care coverage. Patient's daughter reports that Patient's husband has all of the paperwork needed to apply for Medicaid but just hasn't gotten over to the office yet to apply. Patient's family agreeable to get the Medicaid application completed as soon as possible. Patient's daughter reports that her mother is active with [facility] for physical therapy but reports that they would like additional help to include a nursing aide and RN. Patient's daughter reports that her mother is also receiving [facility] services and notes that a Education officer, museum is coming to the home this week to further assess for community resources. CSW to staff with RN Case Manager in regards to further home health needs. Patient's family would also benefit from a [place] list for more supervised support in the home. CSW continues to follow.    Lorrine Kin, MSW, LCSW [facility] ED/43M Clinical Social Worker [phone]

## 2016-12-28 ENCOUNTER — Other Ambulatory Visit: Payer: Self-pay | Admitting: *Deleted

## 2016-12-28 ENCOUNTER — Other Ambulatory Visit: Payer: Self-pay

## 2016-12-28 DIAGNOSIS — M1990 Unspecified osteoarthritis, unspecified site: Secondary | ICD-10-CM | POA: Diagnosis not present

## 2016-12-28 DIAGNOSIS — G20 Parkinson's disease: Secondary | ICD-10-CM | POA: Diagnosis not present

## 2016-12-28 DIAGNOSIS — J449 Chronic obstructive pulmonary disease, unspecified: Secondary | ICD-10-CM | POA: Diagnosis not present

## 2016-12-28 DIAGNOSIS — E1140 Type 2 diabetes mellitus with diabetic neuropathy, unspecified: Secondary | ICD-10-CM | POA: Diagnosis not present

## 2016-12-28 DIAGNOSIS — N181 Chronic kidney disease, stage 1: Secondary | ICD-10-CM | POA: Diagnosis not present

## 2016-12-28 DIAGNOSIS — E1122 Type 2 diabetes mellitus with diabetic chronic kidney disease: Secondary | ICD-10-CM | POA: Diagnosis not present

## 2016-12-28 NOTE — Patient Outreach (Addendum)
Telephone follow up: Placed call to patient to follow up on progress. Husband reports that patient was in the emergency department yesterday for weakness, confusion,  Urinary frequency.  Husband reports that patient is worse today. States that he is having difficulty managing her at home.  Husband states that patient was hollering last night.    Husband denies any recent falls. Reports patients change in condition started about 4-5 days ago and continues to worsen.   Husband reports bath aid never showed up. Reports physical therapist came today but was unable to work with patient.   PLAN: I encouraged husband to have patient seen by primary MD.  I called MD office and secured first available appointment for tomorrow am at 9.    Will notify husband of patients appointment for tomorrow. Will also notify daughter and [facility] Education officer, museum.  Husband informed and will take patient to appointment.  Will call home health company to inquire about bath aid.  [name], RN, BSN, CEN [facility] [employer] [phone]

## 2016-12-29 ENCOUNTER — Other Ambulatory Visit: Payer: Self-pay

## 2016-12-29 ENCOUNTER — Ambulatory Visit: Payer: Medicare Other | Admitting: Internal Medicine

## 2016-12-29 ENCOUNTER — Other Ambulatory Visit: Payer: Self-pay | Admitting: *Deleted

## 2016-12-29 NOTE — Patient Outreach (Signed)
Telephone call: Husband called to states that patient has severe diarrhea and had an accident this am and could not go to MD appointment. Husband reports patient went back to bed.  Husband states that he rescheduled appointment for 2/1 at 4pm.  Encouraged husband if patient worsens to call 911.  Received call from [facility] who reports bath aid was initial not ordered. Reports that she sent order to MD today and that he approved.  States bath aid will start next week. Alyse Low states that she has informed husband.  [name], RN, BSN, CEN [facility] [employer] [phone]

## 2016-12-29 NOTE — Patient Outreach (Signed)
[facility]) Care Management  12/29/2016  Tara Neal [birth year] [id number]   CSW spoke to patient's daughter by phone yesterday. She reports her mother is more confused and going to see her PCP tomorrow- thus, will not be home for my scheduled visit for initial CSW assessment.[name], patient's daughter feels she needs placement but reports patients' husband is not completely on board; they plan to apply for Medicaid and "he will only let her go to [facility]".   CSW encouraged daughter to apply for Medicaid soon to get this processed for potential placement in the near future.  CSW will plan follow up call later this week to check in and reschedule visit/in home assessment.    Eduard Clos, MSW, [place] Worker  [place] [phone]

## 2016-12-30 ENCOUNTER — Encounter: Payer: Self-pay | Admitting: Internal Medicine

## 2016-12-30 ENCOUNTER — Ambulatory Visit (INDEPENDENT_AMBULATORY_CARE_PROVIDER_SITE_OTHER): Payer: Medicare Other | Admitting: Internal Medicine

## 2016-12-30 DIAGNOSIS — N3281 Overactive bladder: Secondary | ICD-10-CM | POA: Diagnosis not present

## 2016-12-30 DIAGNOSIS — N181 Chronic kidney disease, stage 1: Secondary | ICD-10-CM

## 2016-12-30 DIAGNOSIS — I10 Essential (primary) hypertension: Secondary | ICD-10-CM | POA: Diagnosis not present

## 2016-12-30 DIAGNOSIS — E0822 Diabetes mellitus due to underlying condition with diabetic chronic kidney disease: Secondary | ICD-10-CM

## 2016-12-30 MED ORDER — FESOTERODINE FUMARATE ER 4 MG PO TB24: 4.0000 mg | ORAL_TABLET | Freq: Every day | ORAL | 11 refills | Status: DC

## 2016-12-30 NOTE — Patient Instructions (Signed)
Please take all new medication as prescribed - the toviaz for overactive bladder  Please have the pharmacy call if the Lisbeth Ply is not covered by your insurance , with a name for one that is covered  Please continue all other medications as before, and refills have been done if requested.  Please have the pharmacy call with any other refills you may need.  Please keep your appointments with your specialists as you may have planned

## 2016-12-30 NOTE — Progress Notes (Signed)
Subjective:    Patient ID: Tara Neal, female    DOB: [birth year], 76 y.o.   MRN: [id number]  HPI  Here to f/u, s/p recent ED visit with urinary freq, but neg UA.  Pt still going '20 times a day".  Denies urinary symptoms such as dysuria,  urgency, flank pain, hematuria or n/v, fever, chills. Pt denies new neurological symptoms such as new headache, or facial or extremity weakness or numbness   Pt denies polydipsia, polyuria. Pt denies chest pain, increased sob or doe, wheezing, orthopnea, PND, increased LE swelling, palpitations, dizziness or syncope. No other new hx Past Medical History:  Diagnosis Date  . Allergy   . Anemia    hx of   . Anxiety   . Chronic insomnia 04/22/2012  . COPD (chronic obstructive pulmonary disease) ([place])   . Diverticulosis of colon   . GERD (gastroesophageal reflux disease)   . Glaucoma   . Hormone replacement therapy (postmenopausal)   . Hyperlipidemia   . Hypertension   . Low back pain   . Migraine headache    denies  . Mild obesity   . Osteoarthritis   . Osteopenia   . Osteoporosis   . Pneumonia    hx of 5 years ago   . Scoliosis   . Shortness of breath    with exertion   . Spontaneous pneumothorax    1968  . Umbilical hernia [id number]   Past Surgical History:  Procedure Laterality Date  . EXCISION/RELEASE BURSA HIP  04/26/2012   Procedure: EXCISION/RELEASE BURSA HIP;  Surgeon: Gearlean Alf, MD;  Location: [facility];  Service: Orthopedics;  Laterality: Right;  . EYE SURGERY     - bil cataract surg and Retina surgery to left eye  . HERNIA REPAIR  [id number]   umbilical hernia  . HIP ADDUCTOR TENOTOMY    . IR GENERIC HISTORICAL  06/17/2016   IR RADIOLOGIST EVAL & MGMT 06/17/2016 [facility]  . IR GENERIC HISTORICAL  07/01/2016   IR VERTEBROPLASTY LUMBAR BX INC UNI/BIL INC/INJECT/IMAGING 07/01/2016 Luanne Bras, MD [facility]  . IR GENERIC HISTORICAL  07/16/2016   IR RADIOLOGIST EVAL & MGMT 07/16/2016 [facility]  . IR GENERIC  HISTORICAL  10/22/2016   IR VERTEBROPLASTY CERV/THOR BX INC UNI/BIL INC/INJECT/IMAGING 10/22/2016 Luanne Bras, MD [facility]  . IR GENERIC HISTORICAL  10/22/2016   IR VERTEBROPLASTY EA ADDL (T&LS) BX INC UNI/BIL INC INJECT/IMAGING 10/22/2016 Luanne Bras, MD [facility]  . LUMBAR DISC SURGERY      x 2  . OTHER SURGICAL HISTORY     benign growth removed from right under ear at age 77   . SHOULDER ARTHROSCOPY     left shoulder  . TONSILLECTOMY    . TONSILLECTOMY    . WRIST FRACTURE SURGERY     plate in left wrist/ fractured 2 times    reports that she quit smoking about 30 years ago. Her smoking use included Cigarettes. She quit after 20.00 years of use. She has never used smokeless tobacco. She reports that she does not drink alcohol or use drugs. family history includes Diabetes in her mother; Heart disease in her mother; Hyperlipidemia in her mother. Allergies  Allergen Reactions  . Codeine Nausea Only    Can take low doses  . Sulfonamide Derivatives Nausea Only    REACTION: Nausea  . Penicillins Rash    .Marland KitchenHas patient had a PCN reaction causing immediate rash, facial/tongue/throat swelling, SOB or lightheadedness with hypotension: No Has  patient had a PCN reaction causing severe rash involving mucus membranes or skin necrosis: No Has patient had a PCN reaction that required hospitalization No Has patient had a PCN reaction occurring within the last 10 years: No If all of the above answers are "NO", then may proceed with Cephalosporin use.   Marland Kitchen Percocet [Oxycodone-Acetaminophen] Itching  ' Current Outpatient Prescriptions on File Prior to Visit  Medication Sig Dispense Refill  . alendronate (FOSAMAX) 70 MG tablet Take 70 mg by mouth once a week.    . Ascorbic Acid (VITAMIN C) 1000 MG tablet Take 1,000 mg by mouth daily.    Marland Kitchen atenolol (TENORMIN) 50 MG tablet TAKE 1 TABLET EVERY DAY 90 tablet 3  . benazepril (LOTENSIN) 40 MG tablet TAKE 1 TABLET EVERY DAY 90 tablet 3   . calcium carbonate (OS-CAL) 600 MG TABS Take 600 mg by mouth 2 (two) times daily with a meal.    . carbidopa-levodopa (SINEMET IR) 25-100 MG tablet Take 1 tablet by mouth 3 (three) times daily. (Patient taking differently: Take 1.5 tablets by mouth 3 (three) times daily. ) 90 tablet 3  . cetirizine (ZYRTEC) 10 MG tablet Take 10 mg by mouth daily.    . Cholecalciferol (VITAMIN D3) 1000 UNITS CAPS Take 1 capsule by mouth daily.    Marland Kitchen denosumab (PROLIA) 60 MG/ML SOLN injection Inject 60 mg into the skin every 6 (six) months. Administer in upper arm, thigh, or abdomen    . divalproex (DEPAKOTE SPRINKLE) 125 MG capsule Take 125 mg by mouth 2 (two) times daily.    . folic acid (FOLVITE) 1 MG tablet Take 1 mg by mouth daily.    Marland Kitchen gabapentin (NEURONTIN) 300 MG capsule Take 300 mg by mouth 3 (three) times daily.    Marland Kitchen guaiFENesin (MUCINEX) 600 MG 12 hr tablet Take by mouth 2 (two) times daily as needed.     Marland Kitchen HYDROcodone-acetaminophen (NORCO/VICODIN) 5-325 MG tablet Take 1 tablet by mouth every 8 (eight) hours as needed for moderate pain. 15 tablet 0  . latanoprost (XALATAN) 0.005 % ophthalmic solution Place 1 drop into both eyes at bedtime.     . Melatonin 3 MG TABS Take 6 mg by mouth at bedtime.    . meloxicam (MOBIC) 7.5 MG tablet Take 15 mg by mouth daily.     . methotrexate 250 MG/10ML injection Inject 1 mL (25 mg total) into the skin once a week. 10 mL   . Multiple Vitamin (MULTIVITAMIN) tablet Take 1 tablet by mouth daily.    . Omega-3 Fatty Acids (FISH OIL) 1200 MG CAPS Take 2 capsules by mouth daily.     Marland Kitchen omeprazole (PRILOSEC) 20 MG capsule TAKE 2 CAPSULES EVERY DAY 180 capsule 3  . polyethylene glycol (MIRALAX / GLYCOLAX) packet Take 17 g by mouth daily as needed.     Orlie Dakin Sodium (SENNA S PO) Take 1 tablet by mouth daily.    . simvastatin (ZOCOR) 40 MG tablet TAKE 1 TABLET EVERY DAY  AT  6PM 90 tablet 3  . timolol (TIMOPTIC) 0.5 % ophthalmic solution Place 1 drop into both  eyes 2 (two) times daily.     . TURMERIC PO Take 1 capsule by mouth daily.    Marland Kitchen venlafaxine XR (EFFEXOR-XR) 150 MG 24 hr capsule TAKE 1 CAPSULE EVERY DAY WITH BREAKFAST 90 capsule 3  . zolpidem (AMBIEN) 5 MG tablet Take 1 tablet (5 mg total) by mouth at bedtime as needed for sleep. 30 tablet 5  No current facility-administered medications on file prior to visit.    Review of Systems  Constitutional: Negative for unusual diaphoresis or night sweats HENT: Negative for ear swelling or discharge Eyes: Negative for worsening visual haziness  Respiratory: Negative for choking and stridor.   Gastrointestinal: Negative for distension or worsening eructation Genitourinary: Negative for retention or change in urine volume.  Musculoskeletal: Negative for other MSK pain or swelling Skin: Negative for color change and worsening wound Neurological: Negative for tremors and numbness other than noted  Psychiatric/Behavioral: Negative for decreased concentration or agitation other than above   All other system neg per pt    Objective:   Physical Exam BP 126/70   Pulse 72   Resp 20   Wt 118 lb (53.5 kg)   SpO2 98%   BMI 20.25 kg/m  VS noted, not ill appearing Constitutional: Pt appears in no apparent distress HENT: Head: NCAT.  Right Ear: External ear normal.  Left Ear: External ear normal.  Eyes: . Pupils are equal, round, and reactive to light. Conjunctivae and EOM are normal Neck: Normal range of motion. Neck supple.  Cardiovascular: Normal rate and regular rhythm.   Pulmonary/Chest: Effort normal and breath sounds without rales or wheezing.  Abd:  Soft, NT, ND, + BS, no flank tender Neurological: Pt is alert. At baseline confused , motor grossly intact Skin: Skin is warm. No rash, no LE edema Psychiatric: Pt behavior is normal. No agitation.  No other new exam findings    Assessment & Plan:

## 2016-12-30 NOTE — Progress Notes (Signed)
Pre visit review using our clinic review tool, if applicable. No additional management support is needed unless otherwise documented below in the visit note. 

## 2016-12-31 ENCOUNTER — Other Ambulatory Visit: Payer: Self-pay | Admitting: *Deleted

## 2016-12-31 DIAGNOSIS — J449 Chronic obstructive pulmonary disease, unspecified: Secondary | ICD-10-CM | POA: Diagnosis not present

## 2016-12-31 DIAGNOSIS — M1990 Unspecified osteoarthritis, unspecified site: Secondary | ICD-10-CM | POA: Diagnosis not present

## 2016-12-31 DIAGNOSIS — E1122 Type 2 diabetes mellitus with diabetic chronic kidney disease: Secondary | ICD-10-CM | POA: Diagnosis not present

## 2016-12-31 DIAGNOSIS — G20 Parkinson's disease: Secondary | ICD-10-CM | POA: Diagnosis not present

## 2016-12-31 DIAGNOSIS — N181 Chronic kidney disease, stage 1: Secondary | ICD-10-CM | POA: Diagnosis not present

## 2016-12-31 DIAGNOSIS — N3281 Overactive bladder: Secondary | ICD-10-CM | POA: Insufficient documentation

## 2016-12-31 DIAGNOSIS — E1140 Type 2 diabetes mellitus with diabetic neuropathy, unspecified: Secondary | ICD-10-CM | POA: Diagnosis not present

## 2016-12-31 NOTE — Patient Outreach (Signed)
[facility]) Care Management  12/31/2016  Tara Neal [birth year] [id number]   CSW received call from patient's daughter, [name]. She reports they are being treated for a "stomach bug"and PCP placed patient on something to help with her GI issues.  CSW plans in home visit on 01/16/17 to complete CSW initial assessment.   Eduard Clos, MSW, [place] Worker  [place] [phone]

## 2016-12-31 NOTE — Assessment & Plan Note (Signed)
stable overall by history and exam, recent data reviewed with pt, and pt to continue medical treatment as before,  to f/u any worsening symptoms or concerns BP Readings from Last 3 Encounters:  12/30/16 126/70  12/27/16 159/88  12/14/16 126/72

## 2016-12-31 NOTE — Assessment & Plan Note (Signed)
stable overall by history and exam, recent data reviewed with pt, and pt to continue medical treatment as before,  to f/u any worsening symptoms or concerns Lab Results  Component Value Date   HGBA1C 5.7 03/31/2016

## 2016-12-31 NOTE — Assessment & Plan Note (Signed)
For fu urine studies today, and toviaz asd,  to f/u any worsening symptoms or concerns, consider urology but family declines for now

## 2017-01-03 DIAGNOSIS — M150 Primary generalized (osteo)arthritis: Secondary | ICD-10-CM | POA: Diagnosis not present

## 2017-01-03 DIAGNOSIS — Z79899 Other long term (current) drug therapy: Secondary | ICD-10-CM | POA: Diagnosis not present

## 2017-01-03 DIAGNOSIS — M5136 Other intervertebral disc degeneration, lumbar region: Secondary | ICD-10-CM | POA: Diagnosis not present

## 2017-01-03 DIAGNOSIS — M0609 Rheumatoid arthritis without rheumatoid factor, multiple sites: Secondary | ICD-10-CM | POA: Diagnosis not present

## 2017-01-03 DIAGNOSIS — M810 Age-related osteoporosis without current pathological fracture: Secondary | ICD-10-CM | POA: Diagnosis not present

## 2017-01-04 DIAGNOSIS — E1140 Type 2 diabetes mellitus with diabetic neuropathy, unspecified: Secondary | ICD-10-CM | POA: Diagnosis not present

## 2017-01-04 DIAGNOSIS — E1122 Type 2 diabetes mellitus with diabetic chronic kidney disease: Secondary | ICD-10-CM | POA: Diagnosis not present

## 2017-01-04 DIAGNOSIS — N181 Chronic kidney disease, stage 1: Secondary | ICD-10-CM | POA: Diagnosis not present

## 2017-01-04 DIAGNOSIS — J449 Chronic obstructive pulmonary disease, unspecified: Secondary | ICD-10-CM | POA: Diagnosis not present

## 2017-01-04 DIAGNOSIS — G20 Parkinson's disease: Secondary | ICD-10-CM | POA: Diagnosis not present

## 2017-01-04 DIAGNOSIS — M1990 Unspecified osteoarthritis, unspecified site: Secondary | ICD-10-CM | POA: Diagnosis not present

## 2017-01-05 DIAGNOSIS — J449 Chronic obstructive pulmonary disease, unspecified: Secondary | ICD-10-CM | POA: Diagnosis not present

## 2017-01-05 DIAGNOSIS — G20 Parkinson's disease: Secondary | ICD-10-CM | POA: Diagnosis not present

## 2017-01-05 DIAGNOSIS — M1990 Unspecified osteoarthritis, unspecified site: Secondary | ICD-10-CM | POA: Diagnosis not present

## 2017-01-05 DIAGNOSIS — E1122 Type 2 diabetes mellitus with diabetic chronic kidney disease: Secondary | ICD-10-CM | POA: Diagnosis not present

## 2017-01-05 DIAGNOSIS — E1140 Type 2 diabetes mellitus with diabetic neuropathy, unspecified: Secondary | ICD-10-CM | POA: Diagnosis not present

## 2017-01-05 DIAGNOSIS — N181 Chronic kidney disease, stage 1: Secondary | ICD-10-CM | POA: Diagnosis not present

## 2017-01-06 ENCOUNTER — Other Ambulatory Visit: Payer: Self-pay | Admitting: *Deleted

## 2017-01-06 DIAGNOSIS — G20 Parkinson's disease: Secondary | ICD-10-CM | POA: Diagnosis not present

## 2017-01-06 DIAGNOSIS — E1140 Type 2 diabetes mellitus with diabetic neuropathy, unspecified: Secondary | ICD-10-CM | POA: Diagnosis not present

## 2017-01-06 DIAGNOSIS — N181 Chronic kidney disease, stage 1: Secondary | ICD-10-CM | POA: Diagnosis not present

## 2017-01-06 DIAGNOSIS — M1990 Unspecified osteoarthritis, unspecified site: Secondary | ICD-10-CM | POA: Diagnosis not present

## 2017-01-06 DIAGNOSIS — J449 Chronic obstructive pulmonary disease, unspecified: Secondary | ICD-10-CM | POA: Diagnosis not present

## 2017-01-06 DIAGNOSIS — E1122 Type 2 diabetes mellitus with diabetic chronic kidney disease: Secondary | ICD-10-CM | POA: Diagnosis not present

## 2017-01-07 ENCOUNTER — Emergency Department (HOSPITAL_COMMUNITY): Payer: Medicare Other

## 2017-01-07 ENCOUNTER — Inpatient Hospital Stay (HOSPITAL_COMMUNITY)
Admission: EM | Admit: 2017-01-07 | Discharge: 2017-01-10 | DRG: 194 | Disposition: A | Discharge status: Skilled Nursing Facility | Payer: Medicare Other | Attending: Internal Medicine | Admitting: Internal Medicine

## 2017-01-07 ENCOUNTER — Telehealth: Payer: Self-pay | Admitting: Internal Medicine

## 2017-01-07 ENCOUNTER — Encounter (HOSPITAL_COMMUNITY): Payer: Self-pay | Admitting: Emergency Medicine

## 2017-01-07 DIAGNOSIS — H409 Unspecified glaucoma: Secondary | ICD-10-CM | POA: Diagnosis present

## 2017-01-07 DIAGNOSIS — W19XXXA Unspecified fall, initial encounter: Secondary | ICD-10-CM | POA: Diagnosis present

## 2017-01-07 DIAGNOSIS — R1312 Dysphagia, oropharyngeal phase: Secondary | ICD-10-CM | POA: Diagnosis not present

## 2017-01-07 DIAGNOSIS — S0083XA Contusion of other part of head, initial encounter: Secondary | ICD-10-CM | POA: Diagnosis not present

## 2017-01-07 DIAGNOSIS — J440 Chronic obstructive pulmonary disease with acute lower respiratory infection: Secondary | ICD-10-CM | POA: Diagnosis present

## 2017-01-07 DIAGNOSIS — R079 Chest pain, unspecified: Secondary | ICD-10-CM | POA: Diagnosis not present

## 2017-01-07 DIAGNOSIS — J189 Pneumonia, unspecified organism: Secondary | ICD-10-CM | POA: Diagnosis not present

## 2017-01-07 DIAGNOSIS — R41841 Cognitive communication deficit: Secondary | ICD-10-CM | POA: Diagnosis not present

## 2017-01-07 DIAGNOSIS — K219 Gastro-esophageal reflux disease without esophagitis: Secondary | ICD-10-CM | POA: Diagnosis present

## 2017-01-07 DIAGNOSIS — Z66 Do not resuscitate: Secondary | ICD-10-CM | POA: Diagnosis present

## 2017-01-07 DIAGNOSIS — M069 Rheumatoid arthritis, unspecified: Secondary | ICD-10-CM | POA: Diagnosis present

## 2017-01-07 DIAGNOSIS — F0390 Unspecified dementia without behavioral disturbance: Secondary | ICD-10-CM | POA: Diagnosis present

## 2017-01-07 DIAGNOSIS — M6281 Muscle weakness (generalized): Secondary | ICD-10-CM | POA: Diagnosis not present

## 2017-01-07 DIAGNOSIS — S199XXA Unspecified injury of neck, initial encounter: Secondary | ICD-10-CM | POA: Diagnosis not present

## 2017-01-07 DIAGNOSIS — I10 Essential (primary) hypertension: Secondary | ICD-10-CM | POA: Diagnosis not present

## 2017-01-07 DIAGNOSIS — Z7189 Other specified counseling: Secondary | ICD-10-CM | POA: Diagnosis not present

## 2017-01-07 DIAGNOSIS — Z8249 Family history of ischemic heart disease and other diseases of the circulatory system: Secondary | ICD-10-CM

## 2017-01-07 DIAGNOSIS — R2681 Unsteadiness on feet: Secondary | ICD-10-CM | POA: Diagnosis not present

## 2017-01-07 DIAGNOSIS — J181 Lobar pneumonia, unspecified organism: Secondary | ICD-10-CM | POA: Diagnosis not present

## 2017-01-07 DIAGNOSIS — E860 Dehydration: Secondary | ICD-10-CM | POA: Diagnosis not present

## 2017-01-07 DIAGNOSIS — R269 Unspecified abnormalities of gait and mobility: Secondary | ICD-10-CM | POA: Diagnosis not present

## 2017-01-07 DIAGNOSIS — R278 Other lack of coordination: Secondary | ICD-10-CM | POA: Diagnosis not present

## 2017-01-07 DIAGNOSIS — F329 Major depressive disorder, single episode, unspecified: Secondary | ICD-10-CM | POA: Diagnosis not present

## 2017-01-07 DIAGNOSIS — R2689 Other abnormalities of gait and mobility: Secondary | ICD-10-CM | POA: Diagnosis not present

## 2017-01-07 DIAGNOSIS — Z9181 History of falling: Secondary | ICD-10-CM

## 2017-01-07 DIAGNOSIS — S0990XA Unspecified injury of head, initial encounter: Secondary | ICD-10-CM | POA: Diagnosis not present

## 2017-01-07 DIAGNOSIS — Z833 Family history of diabetes mellitus: Secondary | ICD-10-CM | POA: Diagnosis not present

## 2017-01-07 DIAGNOSIS — Z79899 Other long term (current) drug therapy: Secondary | ICD-10-CM

## 2017-01-07 DIAGNOSIS — Z515 Encounter for palliative care: Secondary | ICD-10-CM

## 2017-01-07 DIAGNOSIS — Z87891 Personal history of nicotine dependence: Secondary | ICD-10-CM | POA: Diagnosis not present

## 2017-01-07 DIAGNOSIS — J449 Chronic obstructive pulmonary disease, unspecified: Secondary | ICD-10-CM | POA: Diagnosis not present

## 2017-01-07 DIAGNOSIS — E119 Type 2 diabetes mellitus without complications: Secondary | ICD-10-CM | POA: Diagnosis present

## 2017-01-07 DIAGNOSIS — R296 Repeated falls: Secondary | ICD-10-CM | POA: Diagnosis not present

## 2017-01-07 DIAGNOSIS — R45851 Suicidal ideations: Secondary | ICD-10-CM

## 2017-01-07 DIAGNOSIS — R531 Weakness: Secondary | ICD-10-CM | POA: Diagnosis not present

## 2017-01-07 DIAGNOSIS — F419 Anxiety disorder, unspecified: Secondary | ICD-10-CM | POA: Diagnosis present

## 2017-01-07 DIAGNOSIS — G20 Parkinson's disease: Secondary | ICD-10-CM | POA: Diagnosis present

## 2017-01-07 DIAGNOSIS — E785 Hyperlipidemia, unspecified: Secondary | ICD-10-CM | POA: Diagnosis present

## 2017-01-07 DIAGNOSIS — F32A Depression, unspecified: Secondary | ICD-10-CM | POA: Diagnosis present

## 2017-01-07 DIAGNOSIS — S022XXA Fracture of nasal bones, initial encounter for closed fracture: Secondary | ICD-10-CM | POA: Diagnosis present

## 2017-01-07 DIAGNOSIS — E876 Hypokalemia: Secondary | ICD-10-CM | POA: Diagnosis present

## 2017-01-07 DIAGNOSIS — D472 Monoclonal gammopathy: Secondary | ICD-10-CM | POA: Diagnosis not present

## 2017-01-07 DIAGNOSIS — R627 Adult failure to thrive: Secondary | ICD-10-CM | POA: Diagnosis not present

## 2017-01-07 DIAGNOSIS — R404 Transient alteration of awareness: Secondary | ICD-10-CM | POA: Diagnosis not present

## 2017-01-07 DIAGNOSIS — R05 Cough: Secondary | ICD-10-CM | POA: Diagnosis not present

## 2017-01-07 HISTORY — DX: Unspecified dementia, unspecified severity, without behavioral disturbance, psychotic disturbance, mood disturbance, and anxiety: F03.90

## 2017-01-07 LAB — CK: Total CK: 59 U/L (ref 38–234)

## 2017-01-07 LAB — BASIC METABOLIC PANEL
ANION GAP: 7 (ref 5–15)
BUN: 18 mg/dL (ref 6–20)
CALCIUM: 8.9 mg/dL (ref 8.9–10.3)
CO2: 27 mmol/L (ref 22–32)
Chloride: 100 mmol/L — ABNORMAL LOW (ref 101–111)
Creatinine, Ser: 0.72 mg/dL (ref 0.44–1.00)
GFR calc non Af Amer: >60 mL/min (ref >60)
GLUCOSE: 105 mg/dL — AB (ref 65–99)
POTASSIUM: 3.8 mmol/L (ref 3.5–5.1)
Sodium: 134 mmol/L — ABNORMAL LOW (ref 135–145)

## 2017-01-07 LAB — CBC
HEMATOCRIT: 36.8 % (ref 36.0–46.0)
HEMOGLOBIN: 12.3 g/dL (ref 12.0–15.0)
MCH: 32.1 pg (ref 26.0–34.0)
MCHC: 33.4 g/dL (ref 30.0–36.0)
MCV: 96.1 fL (ref 78.0–100.0)
Platelets: 268 10*3/uL (ref 150–400)
RBC: 3.83 MIL/uL — AB (ref 3.87–5.11)
RDW: 13.6 % (ref 11.5–15.5)
WBC: 16.1 10*3/uL — ABNORMAL HIGH (ref 4.0–10.5)

## 2017-01-07 LAB — TROPONIN I
Troponin I: <0.03 ng/mL (ref <0.03)
Troponin I: <0.03 ng/mL (ref <0.03)

## 2017-01-07 MED ORDER — ACETAMINOPHEN 325 MG PO TABS: 650.0000 mg | ORAL_TABLET | Freq: Four times a day (QID) | ORAL | Status: DC | PRN

## 2017-01-07 MED ORDER — ACETAMINOPHEN 650 MG RE SUPP: 650.0000 mg | Freq: Four times a day (QID) | RECTAL | Status: DC | PRN

## 2017-01-07 MED ORDER — AZITHROMYCIN 250 MG PO TABS
500.0000 mg | ORAL_TABLET | Freq: Once | ORAL | Status: AC
  Administered 2017-01-07: 500 mg via ORAL
  Filled 2017-01-07: qty 2

## 2017-01-07 MED ORDER — GABAPENTIN 300 MG PO CAPS
300.0000 mg | ORAL_CAPSULE | Freq: Three times a day (TID) | ORAL | Status: DC
  Administered 2017-01-07 – 2017-01-10 (×8): 300 mg via ORAL
  Filled 2017-01-07 (×8): qty 1

## 2017-01-07 MED ORDER — ONDANSETRON HCL 4 MG/2ML IJ SOLN: 4.0000 mg | Freq: Four times a day (QID) | INTRAMUSCULAR | Status: DC | PRN

## 2017-01-07 MED ORDER — ENSURE ENLIVE PO LIQD
237.0000 mL | Freq: Two times a day (BID) | ORAL | Status: DC
  Administered 2017-01-08 – 2017-01-10 (×5): 237 mL via ORAL

## 2017-01-07 MED ORDER — GUAIFENESIN ER 600 MG PO TB12: 600.0000 mg | ORAL_TABLET | Freq: Two times a day (BID) | ORAL | Status: DC | PRN

## 2017-01-07 MED ORDER — LATANOPROST 0.005 % OP SOLN
1.0000 [drp] | Freq: Every day | OPHTHALMIC | Status: DC
  Administered 2017-01-07 – 2017-01-09 (×3): 1 [drp] via OPHTHALMIC
  Filled 2017-01-07: qty 2.5

## 2017-01-07 MED ORDER — DEXTROSE 5 % IV SOLN
1.0000 g | INTRAVENOUS | Status: DC
  Administered 2017-01-08 – 2017-01-09 (×2): 1 g via INTRAVENOUS
  Filled 2017-01-07 (×3): qty 10

## 2017-01-07 MED ORDER — CARBIDOPA-LEVODOPA 25-100 MG PO TABS
1.5000 | ORAL_TABLET | Freq: Three times a day (TID) | ORAL | Status: DC
  Administered 2017-01-07 – 2017-01-10 (×8): 1.5 via ORAL
  Filled 2017-01-07 (×8): qty 2

## 2017-01-07 MED ORDER — FESOTERODINE FUMARATE ER 4 MG PO TB24
4.0000 mg | ORAL_TABLET | Freq: Every day | ORAL | Status: DC
  Administered 2017-01-07 – 2017-01-10 (×4): 4 mg via ORAL
  Filled 2017-01-07 (×5): qty 1

## 2017-01-07 MED ORDER — SODIUM CHLORIDE 0.9 % IV SOLN
INTRAVENOUS | Status: AC
  Administered 2017-01-07: 23:00:00 via INTRAVENOUS

## 2017-01-07 MED ORDER — TIMOLOL MALEATE 0.5 % OP SOLN
1.0000 [drp] | Freq: Two times a day (BID) | OPHTHALMIC | Status: DC
  Administered 2017-01-07 – 2017-01-10 (×6): 1 [drp] via OPHTHALMIC
  Filled 2017-01-07: qty 5

## 2017-01-07 MED ORDER — ATENOLOL 50 MG PO TABS
50.0000 mg | ORAL_TABLET | Freq: Every day | ORAL | Status: DC
  Administered 2017-01-08 – 2017-01-10 (×3): 50 mg via ORAL
  Filled 2017-01-07 (×3): qty 1

## 2017-01-07 MED ORDER — FOLIC ACID 1 MG PO TABS
1.0000 mg | ORAL_TABLET | Freq: Every day | ORAL | Status: DC
  Administered 2017-01-07 – 2017-01-10 (×4): 1 mg via ORAL
  Filled 2017-01-07 (×4): qty 1

## 2017-01-07 MED ORDER — ALBUTEROL SULFATE (2.5 MG/3ML) 0.083% IN NEBU: 2.5000 mg | INHALATION_SOLUTION | RESPIRATORY_TRACT | Status: DC | PRN

## 2017-01-07 MED ORDER — DEXTROSE 5 % IV SOLN
500.0000 mg | INTRAVENOUS | Status: DC
  Administered 2017-01-08: 500 mg via INTRAVENOUS
  Filled 2017-01-07 (×2): qty 500

## 2017-01-07 MED ORDER — CEFTRIAXONE SODIUM 1 G IJ SOLR
1.0000 g | Freq: Once | INTRAMUSCULAR | Status: AC
  Administered 2017-01-07: 1 g via INTRAVENOUS
  Filled 2017-01-07: qty 10

## 2017-01-07 MED ORDER — SODIUM CHLORIDE 0.9% FLUSH
3.0000 mL | Freq: Two times a day (BID) | INTRAVENOUS | Status: DC
  Administered 2017-01-07 – 2017-01-08 (×2): 3 mL via INTRAVENOUS

## 2017-01-07 MED ORDER — DIVALPROEX SODIUM 125 MG PO DR TAB
125.0000 mg | DELAYED_RELEASE_TABLET | Freq: Two times a day (BID) | ORAL | Status: DC
  Administered 2017-01-07 – 2017-01-10 (×6): 125 mg via ORAL
  Filled 2017-01-07 (×7): qty 1

## 2017-01-07 MED ORDER — VENLAFAXINE HCL ER 150 MG PO CP24
150.0000 mg | ORAL_CAPSULE | Freq: Every day | ORAL | Status: DC
  Administered 2017-01-08 – 2017-01-10 (×3): 150 mg via ORAL
  Filled 2017-01-07 (×3): qty 1

## 2017-01-07 MED ORDER — BENAZEPRIL HCL 10 MG PO TABS
40.0000 mg | ORAL_TABLET | Freq: Every day | ORAL | Status: DC
  Administered 2017-01-07 – 2017-01-10 (×4): 40 mg via ORAL
  Filled 2017-01-07 (×4): qty 4

## 2017-01-07 MED ORDER — ONDANSETRON HCL 4 MG PO TABS: 4.0000 mg | ORAL_TABLET | Freq: Four times a day (QID) | ORAL | Status: DC | PRN

## 2017-01-07 MED ORDER — HYDROCODONE-ACETAMINOPHEN 5-325 MG PO TABS: 1.0000 | ORAL_TABLET | Freq: Three times a day (TID) | ORAL | Status: DC | PRN

## 2017-01-07 MED ORDER — SIMVASTATIN 40 MG PO TABS
40.0000 mg | ORAL_TABLET | Freq: Every day | ORAL | Status: DC
  Administered 2017-01-08 – 2017-01-09 (×2): 40 mg via ORAL
  Filled 2017-01-07 (×2): qty 1

## 2017-01-07 NOTE — Telephone Encounter (Signed)
Home health needs verbal orders.  OT  1x week for 1 week 2x a week for 3weeks.   For upper body stretching. ADL training, Care giving education,  [phone] - Sonia Baller

## 2017-01-07 NOTE — ED Notes (Signed)
Patient stated she needed to use the restroom. I went to put patient on bedpan and she had already soiled the bed. We cleaned her and placed a new brief on her.

## 2017-01-07 NOTE — H&P (Addendum)
Tara Neal [id number] DOB: [birth year] DOA: 01/07/2017     PCP: [name], MD   Outpatient Specialists: Dr. Carles Collet  Neurology Patient coming from:    home Lives   With family    Chief Complaint: was found down on the floor  HPI: Tara Neal is a 76 y.o. female with medical history significant of HLD, HTN,   COPD, dementia, GERD, Parkinson, rhuematoid arthritis  Presented with unwitnessed fall patient was found down on the floor in a.m. was noted to have nasal deformity EMS was called. She does not remember how she has fallen down. She has dementia and able to provide detailed history. Husband found her in the morning Lane at the foot of the bed last time he saw her she was in bed that was around 4 AM. Patient's husband reports cough for past 3 days. Family reports hacking cough non productive no fever, family states she have had some chest pain today chest pain appeared to be severe patient was collection in her chest while sitting up and lasted a few minutes and then resolved family states that while this was happening her heart rate went up but then it came down again. Family reports 1 day of diarrhea it was dark in color currently resolved.   Patient has had suicidal ideation asking family to please kill her. She has endorsed wishes to end her life but no plan. Husband has a shotgun in the house but it was loaded. Family is worried that she has been trying to get to the gun or possibly pain medications.  Family reports very poor by mouth intake patient bases stopped eating or drinking  Regarding pertinent Chronic problems: She has hx of Parkinson disease she has been on Sinemet but have been getting progressively worse. Has hx of RA on methotrexate injections once a week    IN ER:  Temp (24hrs), Avg:97.4 F (36.3 C), Min:97.2 F (36.2 C), Max:97.6 F (36.4 C)   97.2 RR 16 Oxygen sat 97%  HR 76 BP  161/52 Na 134 K 3.8Cl100 BUN 18  Cr 0.72 WBC 16.1 Hg 12.3  CXR new left base  airspace disease suspicious for pneumonia. CT head with multiple bilateral nasal bone fractures no acute fracture or traumatic malalignment of cervical spine   Following Medications were ordered in ER: Medications  cefTRIAXone (ROCEPHIN) 1 g in dextrose 5 % 50 mL IVPB (1 g Intravenous New Bag/Given 01/07/17 1745)  azithromycin (ZITHROMAX) tablet 500 mg (500 mg Oral Given 01/07/17 1745)       Hospitalist was called for admission for Community-acquired pneumonia and recent suicidal ideation  Review of Systems:    Pertinent positives include: confusion  Constitutional:  No weight loss, night sweats, Fevers, chills, fatigue, weight loss  HEENT:  No headaches, Difficulty swallowing,Tooth/dental problems,Sore throat,  No sneezing, itching, ear ache, nasal congestion, post nasal drip,  Cardio-vascular:  No chest pain, Orthopnea, PND, anasarca, dizziness, palpitations.no Bilateral lower extremity swelling  GI:  No heartburn, indigestion, abdominal pain, nausea, vomiting, diarrhea, change in bowel habits, loss of appetite, melena, blood in stool, hematemesis Resp:  no shortness of breath at rest. No dyspnea on exertion, No excess mucus, no productive cough, No non-productive cough, No coughing up of blood.No change in color of mucus.No wheezing. Skin:  no rash or lesions. No jaundice GU:  no dysuria, change in color of urine, no urgency or frequency. No straining to urinate.  No flank pain.  Musculoskeletal:  No joint  pain or no joint swelling. No decreased range of motion. No back pain.  Psych:  No change in mood or affect. No depression or anxiety. No memory loss.  Neuro: no localizing neurological complaints, no tingling, no weakness, no double vision, no gait abnormality, no slurred speech, no confusion  As per HPI otherwise 10 point review of systems negative.   Past Medical History: Past Medical History:  Diagnosis Date  . Allergy   . Anemia    hx of   . Anxiety   . Chronic  insomnia 04/22/2012  . COPD (chronic obstructive pulmonary disease) ([place])   . Dementia   . Diverticulosis of colon   . GERD (gastroesophageal reflux disease)   . Glaucoma   . Hormone replacement therapy (postmenopausal)   . Hyperlipidemia   . Hypertension   . Low back pain   . Migraine headache    denies  . Mild obesity   . Osteoarthritis   . Osteopenia   . Osteoporosis   . Pneumonia    hx of 5 years ago   . Scoliosis   . Shortness of breath    with exertion   . Spontaneous pneumothorax    1968  . Umbilical hernia [id number]   Past Surgical History:  Procedure Laterality Date  . EXCISION/RELEASE BURSA HIP  04/26/2012   Procedure: EXCISION/RELEASE BURSA HIP;  Surgeon: Gearlean Alf, MD;  Location: [facility];  Service: Orthopedics;  Laterality: Right;  . EYE SURGERY     - bil cataract surg and Retina surgery to left eye  . HERNIA REPAIR  [id number]   umbilical hernia  . HIP ADDUCTOR TENOTOMY    . IR GENERIC HISTORICAL  06/17/2016   IR RADIOLOGIST EVAL & MGMT 06/17/2016 [facility]  . IR GENERIC HISTORICAL  07/01/2016   IR VERTEBROPLASTY LUMBAR BX INC UNI/BIL INC/INJECT/IMAGING 07/01/2016 Luanne Bras, MD [facility]  . IR GENERIC HISTORICAL  07/16/2016   IR RADIOLOGIST EVAL & MGMT 07/16/2016 [facility]  . IR GENERIC HISTORICAL  10/22/2016   IR VERTEBROPLASTY CERV/THOR BX INC UNI/BIL INC/INJECT/IMAGING 10/22/2016 Luanne Bras, MD [facility]  . IR GENERIC HISTORICAL  10/22/2016   IR VERTEBROPLASTY EA ADDL (T&LS) BX INC UNI/BIL INC INJECT/IMAGING 10/22/2016 Luanne Bras, MD [facility]  . LUMBAR DISC SURGERY      x 2  . OTHER SURGICAL HISTORY     benign growth removed from right under ear at age 83   . SHOULDER ARTHROSCOPY     left shoulder  . TONSILLECTOMY    . TONSILLECTOMY    . WRIST FRACTURE SURGERY     plate in left wrist/ fractured 2 times     Social History:  Ambulatory  walker with assist    reports that she quit smoking about 30 years  ago. Her smoking use included Cigarettes. She quit after 20.00 years of use. She has never used smokeless tobacco. She reports that she does not drink alcohol or use drugs.  Allergies:   Allergies  Allergen Reactions  . Codeine Nausea Only    Can take low doses  . Sulfonamide Derivatives Nausea Only    REACTION: Nausea  . Penicillins Rash    .Marland KitchenHas patient had a PCN reaction causing immediate rash, facial/tongue/throat swelling, SOB or lightheadedness with hypotension: No Has patient had a PCN reaction causing severe rash involving mucus membranes or skin necrosis: No Has patient had a PCN reaction that required hospitalization No Has patient had a PCN reaction occurring within the last  10 years: No If all of the above answers are "NO", then may proceed with Cephalosporin use.   Marland Kitchen Percocet [Oxycodone-Acetaminophen] Itching       Family History:   Family History  Problem Relation Age of Onset  . Hyperlipidemia Mother   . Heart disease Mother   . Diabetes Mother     Medications: Prior to Admission medications   Medication Sig Start Date End Date Taking? Authorizing Provider  alendronate (FOSAMAX) 70 MG tablet Take 70 mg by mouth once a week. 11/24/16  Yes Historical Provider, MD  Ascorbic Acid (VITAMIN C) 1000 MG tablet Take 1,000 mg by mouth daily.   Yes Historical Provider, MD  atenolol (TENORMIN) 50 MG tablet TAKE 1 TABLET EVERY DAY 03/15/16  Yes Biagio Borg, MD  benazepril (LOTENSIN) 40 MG tablet TAKE 1 TABLET EVERY DAY 03/15/16  Yes Biagio Borg, MD  calcium carbonate (OS-CAL) 600 MG TABS Take 600 mg by mouth 2 (two) times daily with a meal.   Yes Historical Provider, MD  carbidopa-levodopa (SINEMET IR) 25-100 MG tablet Take 1 tablet by mouth 3 (three) times daily. Patient taking differently: Take 1.5 tablets by mouth 3 (three) times daily.  09/20/16  Yes Rebecca S Tat, DO  cetirizine (ZYRTEC) 10 MG tablet Take 10 mg by mouth daily.   Yes Historical Provider, MD    Cholecalciferol (VITAMIN D3) 1000 UNITS CAPS Take 1 capsule by mouth daily.   Yes Historical Provider, MD  denosumab (PROLIA) 60 MG/ML SOLN injection Inject 60 mg into the skin every 6 (six) months. Administer in upper arm, thigh, or abdomen   Yes Historical Provider, MD  divalproex (DEPAKOTE) 125 MG DR tablet Take 125 mg by mouth 2 (two) times daily.   Yes Historical Provider, MD  fesoterodine (TOVIAZ) 4 MG TB24 tablet Take 1 tablet (4 mg total) by mouth daily. 12/30/16  Yes Biagio Borg, MD  folic acid (FOLVITE) 1 MG tablet Take 1 mg by mouth daily.   Yes Historical Provider, MD  gabapentin (NEURONTIN) 300 MG capsule Take 300 mg by mouth 3 (three) times daily.   Yes Historical Provider, MD  guaiFENesin (MUCINEX) 600 MG 12 hr tablet Take by mouth 2 (two) times daily as needed.    Yes Historical Provider, MD  HYDROcodone-acetaminophen (NORCO/VICODIN) 5-325 MG tablet Take 1 tablet by mouth every 8 (eight) hours as needed for moderate pain. 10/02/16  Yes Robbie Lis, MD  latanoprost (XALATAN) 0.005 % ophthalmic solution Place 1 drop into both eyes at bedtime.  10/09/13  Yes Historical Provider, MD  meloxicam (MOBIC) 7.5 MG tablet Take 15 mg by mouth daily.  07/19/16  Yes Historical Provider, MD  methotrexate 250 MG/10ML injection Inject 1 mL (25 mg total) into the skin once a week. 05/19/16  Yes Velvet Bathe, MD  Multiple Vitamin (MULTIVITAMIN) tablet Take 1 tablet by mouth daily.   Yes Historical Provider, MD  Omega-3 Fatty Acids (FISH OIL) 1200 MG CAPS Take 2 capsules by mouth daily.    Yes Historical Provider, MD  omeprazole (PRILOSEC) 20 MG capsule TAKE 2 CAPSULES EVERY DAY 03/15/16  Yes Biagio Borg, MD  polyethylene glycol [facility] / GLYCOLAX) packet Take 17 g by mouth daily as needed.    Yes Historical Provider, MD  Sennosides-Docusate Sodium (SENNA S PO) Take 1 tablet by mouth daily.   Yes Historical Provider, MD  simvastatin (ZOCOR) 40 MG tablet TAKE 1 TABLET EVERY DAY  AT  [facility] 03/15/16  Yes  Hunt Oris  John, MD  timolol (TIMOPTIC) 0.5 % ophthalmic solution Place 1 drop into both eyes 2 (two) times daily.    Yes Historical Provider, MD  TURMERIC PO Take 1 capsule by mouth daily.   Yes Historical Provider, MD  venlafaxine XR (EFFEXOR-XR) 150 MG 24 hr capsule TAKE 1 CAPSULE EVERY DAY WITH BREAKFAST 11/18/16  Yes Biagio Borg, MD  zolpidem (AMBIEN) 5 MG tablet Take 1 tablet (5 mg total) by mouth at bedtime as needed for sleep. 11/18/16  Yes Biagio Borg, MD  VOLTAREN 1 % GEL Apply 1 application topically daily as needed. 01/03/17   Historical Provider, MD    Physical Exam: Patient Vitals for the past 24 hrs:  BP Temp Temp src Pulse Resp SpO2 Weight  01/07/17 1720 (!) 161/52 97.2 F (36.2 C) Oral 76 16 97 % -  01/07/17 1257 161/86 - - 77 16 96 % -  01/07/17 1039 147/77 97.6 F (36.4 C) Oral 69 18 100 % 53.5 kg (118 lb)  01/07/17 1029 - - - - - 97 % -    1. General:  in No Acute distress 2. Psychological: Alert but  not  Oriented 3. Head/ENT:   Moist Dry Mucous Membranes                          Head Non traumatic, neck supple                            Poor Dentition 4. SKIN: decreased Skin turgor,  Skin clean Dry and intact no rash 5. Heart: Regular rate and rhythm no Murmur, Rub or gallop 6. Lungs:  no wheezes some  crackles   7. Abdomen: Soft,  non-tender, Non distended 8. Lower extremities: no clubbing, cyanosis, or edema 9. Neurologically  strength 5 out of 5 in all 4 extremities cranial nerves II through XII intact 10. MSK: Normal range of motion   body mass index is 20.25 kg/m.  Labs on Admission:   Labs on Admission: I have personally reviewed following labs and imaging studies  CBC:  Recent Labs Lab 01/07/17 1417  WBC 16.1*  HGB 12.3  HCT 36.8  MCV 96.1  PLT [id number]   Basic Metabolic Panel:  Recent Labs Lab 01/07/17 1417  NA 134*  K 3.8  CL 100*  CO2 27  GLUCOSE 105*  BUN 18  CREATININE 0.72  CALCIUM 8.9   GFR: Estimated Creatinine Clearance:  51.3 mL/min (by C-G formula based on SCr of 0.72 mg/dL). Liver Function Tests: No results for input(s): AST, ALT, ALKPHOS, BILITOT, PROT, ALBUMIN in the last 168 hours. No results for input(s): LIPASE, AMYLASE in the last 168 hours. No results for input(s): AMMONIA in the last 168 hours. Coagulation Profile: No results for input(s): INR, PROTIME in the last 168 hours. Cardiac Enzymes: No results for input(s): CKTOTAL, CKMB, CKMBINDEX, TROPONINI in the last 168 hours. BNP (last 3 results) No results for input(s): PROBNP in the last 8760 hours. HbA1C: No results for input(s): HGBA1C in the last 72 hours. CBG: No results for input(s): GLUCAP in the last 168 hours. Lipid Profile: No results for input(s): CHOL, HDL, LDLCALC, TRIG, CHOLHDL, LDLDIRECT in the last 72 hours. Thyroid Function Tests: No results for input(s): TSH, T4TOTAL, FREET4, T3FREE, THYROIDAB in the last 72 hours. Anemia Panel: No results for input(s): VITAMINB12, FOLATE, FERRITIN, TIBC, IRON, RETICCTPCT in the last 72 hours.  Sepsis  Labs: @LABRCNTIP (procalcitonin:4,lacticidven:4) )No results found for this or any previous visit (from the past 240 hour(s)).     UA  ordered  Lab Results  Component Value Date   HGBA1C 5.7 03/31/2016    Estimated Creatinine Clearance: 51.3 mL/min (by C-G formula based on SCr of 0.72 mg/dL).  BNP (last 3 results) No results for input(s): PROBNP in the last 8760 hours.   ECG REPORT  Independently reviewed Rate: 77  Rhythm: Sinus with PAC ST&T Change: No acute ischemic changes  QTC 404  Filed Weights   01/07/17 1039  Weight: 53.5 kg (118 lb)     Cultures:    Component Value Date/Time   SDES URINE, RANDOM 09/29/2016 1128   SPECREQUEST NONE 09/29/2016 1128   CULT >=100,000 COLONIES/mL KLEBSIELLA PNEUMONIAE (A) 09/29/2016 1128   REPTSTATUS 10/01/2016 FINAL 09/29/2016 1128     Radiological Exams on Admission: Dg Chest 2 View  Result Date: 01/07/2017 CLINICAL DATA:   Coughing. EXAM: CHEST  2 VIEW COMPARISON:  12/18/2016 FINDINGS: AP and lateral views of the chest show new airspace disease left lung base, suspicious for pneumonia. Right lung appears hyperexpanded but clear. Cardiopericardial silhouette is at upper limits of normal for size. The visualized bony structures of the thorax are intact. Lower thoracic vertebral augmentation again noted. IMPRESSION: New left base airspace disease, suspicious for pneumonia. Electronically Signed   By: Misty Stanley M.D.   On: 01/07/2017 16:12   Ct Head Wo Contrast  Result Date: 01/07/2017 CLINICAL DATA:  Unwitnessed fall.  Nasal deformity. EXAM: CT HEAD WITHOUT CONTRAST CT MAXILLOFACIAL WITHOUT CONTRAST CT CERVICAL SPINE WITHOUT CONTRAST TECHNIQUE: Multidetector CT imaging of the head, cervical spine, and maxillofacial structures were performed using the standard protocol without intravenous contrast. Multiplanar CT image reconstructions of the cervical spine and maxillofacial structures were also generated. COMPARISON:  December 26, 2016 FINDINGS: CT HEAD FINDINGS Brain: No subdural, epidural, or subarachnoid hemorrhage. The ventricles and sulci are normal for age. The cerebellum, brainstem, and basal cisterns are normal. No mass, mass effect, or midline shift. White matter changes identified. No acute cortical ischemia or infarct. Vascular: Atherosclerotic changes in the intracranial carotid arteries. Skull: Nasal bone fractures, described below. No other bony abnormalities. Other: None. CT MAXILLOFACIAL FINDINGS Osseous: Bilateral nasal bone fractures.  No other fractures. Orbits: Negative. No traumatic or inflammatory finding. Sinuses: Clear. Soft tissues: Negative. CT CERVICAL SPINE FINDINGS Alignment: Kyphosis is seen in the cervical spine centered at C4-5. 2.6 mm of anterolisthesis of C7 versus T1. No other significant malalignment. Skull base and vertebrae: Fragmentation of the C7 right facet is favored to be chronic from  previous trauma. No evidence of acute fracture on this study. Soft tissues and spinal canal: No prevertebral fluid or swelling. No visible canal hematoma. Disc levels:  Multilevel degenerative changes. Upper chest: By a focal pleuroparenchymal opacities are most consistent with scarring, right greater than left. No other abnormality seen within the lung apices. Other: No other abnormalities. IMPRESSION: 1. No acute intracranial abnormality. 2. Multiple bilateral nasal bone fractures. 3. No acute fracture or traumatic malalignment in the cervical spine. Minimal anterolisthesis of C7 versus T1 is probably degenerative with no identified fracture. Fragmentation of the right C7 inferior facet has a chronic appearance as well. Electronically Signed   By: Dorise Bullion III M.D   On: 01/07/2017 14:18   Ct Cervical Spine Wo Contrast  Result Date: 01/07/2017 CLINICAL DATA:  Unwitnessed fall.  Nasal deformity. EXAM: CT HEAD WITHOUT CONTRAST CT MAXILLOFACIAL WITHOUT CONTRAST CT  CERVICAL SPINE WITHOUT CONTRAST TECHNIQUE: Multidetector CT imaging of the head, cervical spine, and maxillofacial structures were performed using the standard protocol without intravenous contrast. Multiplanar CT image reconstructions of the cervical spine and maxillofacial structures were also generated. COMPARISON:  December 26, 2016 FINDINGS: CT HEAD FINDINGS Brain: No subdural, epidural, or subarachnoid hemorrhage. The ventricles and sulci are normal for age. The cerebellum, brainstem, and basal cisterns are normal. No mass, mass effect, or midline shift. White matter changes identified. No acute cortical ischemia or infarct. Vascular: Atherosclerotic changes in the intracranial carotid arteries. Skull: Nasal bone fractures, described below. No other bony abnormalities. Other: None. CT MAXILLOFACIAL FINDINGS Osseous: Bilateral nasal bone fractures.  No other fractures. Orbits: Negative. No traumatic or inflammatory finding. Sinuses: Clear. Soft  tissues: Negative. CT CERVICAL SPINE FINDINGS Alignment: Kyphosis is seen in the cervical spine centered at C4-5. 2.6 mm of anterolisthesis of C7 versus T1. No other significant malalignment. Skull base and vertebrae: Fragmentation of the C7 right facet is favored to be chronic from previous trauma. No evidence of acute fracture on this study. Soft tissues and spinal canal: No prevertebral fluid or swelling. No visible canal hematoma. Disc levels:  Multilevel degenerative changes. Upper chest: By a focal pleuroparenchymal opacities are most consistent with scarring, right greater than left. No other abnormality seen within the lung apices. Other: No other abnormalities. IMPRESSION: 1. No acute intracranial abnormality. 2. Multiple bilateral nasal bone fractures. 3. No acute fracture or traumatic malalignment in the cervical spine. Minimal anterolisthesis of C7 versus T1 is probably degenerative with no identified fracture. Fragmentation of the right C7 inferior facet has a chronic appearance as well. Electronically Signed   By: Dorise Bullion III M.D   On: 01/07/2017 14:18   Ct Maxillofacial Wo Cm  Result Date: 01/07/2017 CLINICAL DATA:  Unwitnessed fall.  Nasal deformity. EXAM: CT HEAD WITHOUT CONTRAST CT MAXILLOFACIAL WITHOUT CONTRAST CT CERVICAL SPINE WITHOUT CONTRAST TECHNIQUE: Multidetector CT imaging of the head, cervical spine, and maxillofacial structures were performed using the standard protocol without intravenous contrast. Multiplanar CT image reconstructions of the cervical spine and maxillofacial structures were also generated. COMPARISON:  December 26, 2016 FINDINGS: CT HEAD FINDINGS Brain: No subdural, epidural, or subarachnoid hemorrhage. The ventricles and sulci are normal for age. The cerebellum, brainstem, and basal cisterns are normal. No mass, mass effect, or midline shift. White matter changes identified. No acute cortical ischemia or infarct. Vascular: Atherosclerotic changes in the  intracranial carotid arteries. Skull: Nasal bone fractures, described below. No other bony abnormalities. Other: None. CT MAXILLOFACIAL FINDINGS Osseous: Bilateral nasal bone fractures.  No other fractures. Orbits: Negative. No traumatic or inflammatory finding. Sinuses: Clear. Soft tissues: Negative. CT CERVICAL SPINE FINDINGS Alignment: Kyphosis is seen in the cervical spine centered at C4-5. 2.6 mm of anterolisthesis of C7 versus T1. No other significant malalignment. Skull base and vertebrae: Fragmentation of the C7 right facet is favored to be chronic from previous trauma. No evidence of acute fracture on this study. Soft tissues and spinal canal: No prevertebral fluid or swelling. No visible canal hematoma. Disc levels:  Multilevel degenerative changes. Upper chest: By a focal pleuroparenchymal opacities are most consistent with scarring, right greater than left. No other abnormality seen within the lung apices. Other: No other abnormalities. IMPRESSION: 1. No acute intracranial abnormality. 2. Multiple bilateral nasal bone fractures. 3. No acute fracture or traumatic malalignment in the cervical spine. Minimal anterolisthesis of C7 versus T1 is probably degenerative with no identified fracture. Fragmentation of the right  C7 inferior facet has a chronic appearance as well. Electronically Signed   By: Dorise Bullion III M.D   On: 01/07/2017 14:18    Chart has been reviewed    Assessment/Plan  76 y.o. female with medical history significant of HLD, HTN,   COPD, dementia, GERD, Parkinson, rhuematoid arthritis admitted with unwitnessed fall and was found to have community-acquired pneumonia  Present on Admission: . CAP (community acquired pneumonia)  - will admit for treatment of CAP will start on appropriate antibiotic coverage.   Obtain sputum cultures, blood cultures if febrile or if decompensates.  Provide oxygen as needed.   Marland Kitchen COPD (chronic obstructive pulmonary disease) (H[facility]) stable continue  home medications currently no wheezing Glaucoma stable continue home medications test for influenza . Dehydration gently rehydrate and monitor her kidney status . Depression with suicidal ideations ordered sitter at bedside and suicidal precautions behavioral health consult . Essential hypertension stable continue home medications . Recurrent falls while walking PT OT evaluation likely will need placement . Rheumatoid arthritis ([place]) - patient on methotrexate weekly while admitted we'll hold off on Parkinson disease we'll continue Sinemet Ensure chronic expect some degree of sundowning while hospitalized Diarrhea - isolated episode currently have not had any further episodes, given history of dark-colored will order Hemoccult stool and continue to monitor if recurs will need further testing Chest pain patient unable to provide detailed history given sudden onset and clutching at the chest will cycle cardiac enzymes currently appears to be comfortable pain nonreproducible by palpation obtain echogram. Patient had an unwitnessed fall and unable to provide her own history syncope could not be ruled out. Family states that if she was found to have coronary artery disease that would be interested in aggressive management Abrasion of left shoulder currently no tenderness to palpation patient appears to be able to move that arm would continue to follow on image if needed Other plan as per orders.  DVT prophylaxis:   SCD  Code Status:  DNR/DNI   as per family   Family Communication:   Family  at  Bedside  plan of care was discussed with  Daughter and son,  Husband   Disposition Plan:   likely will need placement for rehabilitation                             Would benefit from PT/OT eval prior to DC   ordered                      Social Work     Nutrition  consulted                          Consults called: Behavioral health  Admission status: inpatient       Level of care    tele          I have spent a total of 56 min on this admission    extra time was spent to discuss case with behavioral health  [place] 01/07/2017, 8:14 PM    [facility]  Pager [phone]   after 2 AM please page floor coverage PA If 7AM-7PM, please contact the day team taking care of the patient  [email or web address]  Password [email or web address]

## 2017-01-07 NOTE — ED Provider Notes (Addendum)
  Face-to-face evaluation   History: She presents for evaluation of gradually worse ability to care for self, decreased oral intake, only concern for using narcotics, which are not prescribed for her, and then a fall which was noticed at 4 AM today. Her husband was sleeping downstairs, although when the dog, marked and then found his wife on the floor at the foot of the bed. She injured her face and left shoulder in the fall. She has dementia and is unable to give good history. 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 While in the emergency department, the patient's family members heard her request to be killed. She has not been previously suicidal. She has chronic ongoing depression. Family members state that she is out of her Depakote, which she takes for "shaking hands".  Physical exam: Alert, elderly female who is cooperative and responsive. He speaks slowly. She is a poor historian. Regular rate and rhythm without murmur. Lungs clear anteriorly. Extremities- bruising over left lateral shoulder, without deformity. Normal range of motion, arms and legs bilaterally. Neurologic alert, symmetric strength bilaterally. Toes are held in plantar flexion and she resists dorsi flexion.  Medical decision making- debilitated female, with recent decompensation, elevated white blood cell count and pneumonia on x-ray. Patient complained of wanting to die, by requesting to be killed in the emergency department.   5:09 PM-Consult complete with hospitalist. Patient case explained and discussed. She agrees to admit patient for further evaluation and treatment. Call ended at 18:35  Medical screening examination/treatment/procedure(s) were conducted as a shared visit with non-physician practitioner(s) and myself.  I personally evaluated the patient during the encounter   Daleen Bo, MD 01/07/17 Rose Phi, MD 01/10/17 [phone]

## 2017-01-07 NOTE — Clinical Social Work Note (Signed)
Clinical Social Work Assessment  Patient Details  Name: Tara Neal MRN: [id number] Date of Birth: 13-Jan-1941  Date of referral:  01/07/17               Reason for consult:  Facility Placement, Suicide Risk/Attempt                Permission sought to share information with:  Customer service manager, Landscape architect granted to share information::  Yes, Verbal Permission Granted  Name::        Agency::     Relationship::     Contact Information:     Housing/Transportation Living arrangements for the past 2 months:  Single Family Home Source of Information:  Adult Children, Spouse Patient Interpreter Needed:  None Criminal Activity/Legal Involvement Pertinent to Current Situation/Hospitalization:    Significant Relationships:  Adult Children Lives with:    Do you feel safe going back to the place where you live?  No (Per pt's daughter) Need for family participation in patient care:  Yes (Comment)  Care giving concerns:  Pt was recently diagnosed with SPs (dementia) and pt's family are conccerned with a suicidal ideation without a plan voiced from the pt to the pt's spouse.  Pt's daughter stated pt recently was seen retrieiving large amounts of pills when found by her spouse which added to their concerns.  Social Worker assessment / plan:  Assessment  CSW met with pt and confirmed pt's plan to be discharged to [facility] if this is a proper discharge for the pt.  The pt was there in December 2017 and was discharged approx 11/14/16.   Pt's family are concerned pt may need to be placed in a memory care facility for long-term, residential care at discharge.  Pt has been living independently prior to discharge with her spouse.   Employment status:  Retired Nurse, adult, [employer] [place] PT Recommendations:  Not assessed at this time [place] / Referral to community resources:  [place] ([place])  Patient/Family's Response to care:  Patient not alert and oriented.  Pt's daughter agreeable to plan for [facility] if a proper discharge and memory care not appropriate.  Pt's daughter has already attempted to call their contact at [place], but their contact is out of town until Tuesday.  Pt's daughter supportive and strongly involved in pt.'s care.  Pt.'s daughter pleasant and appreciated CSW intervention.    Patient/Family's Understanding of and Emotional Response to Diagnosis, Current Treatment, and Prognosis:  Still assessing  Emotional Assessment Appearance:  Appears older than stated age Attitude/Demeanor/Rapport:  Unable to Assess Affect (typically observed):  Unable to Assess Orientation:  Fluctuating Orientation (Suspected and/or reported Sundowners) Alcohol / Substance use:    Psych involvement (Current and /or in the community):     Discharge Needs  Concerns to be addressed:  Adjustment to Illness Readmission within the last 30 days:  No Current discharge risk:  Cognitively Impaired Barriers to Discharge:  No Barriers Identified   Claudine Mouton, LCSWA 01/07/2017, 6:21 PM

## 2017-01-07 NOTE — ED Notes (Signed)
Hospitalist at bedside 

## 2017-01-07 NOTE — ED Provider Notes (Signed)
[facility] DEPT Provider Note   CSN: [id number] Arrival date & time: 01/07/17  1022     History   Chief Complaint Chief Complaint  Patient presents with  . Fall  . Facial Injury    HPI Tara Neal is a 76 y.o. female with history of dementia presents to the ED after unwitnessed fall. She reports not remembering the fall or trying to walk on her own. She denies any pain at this time.   Husband states he lives alone at home with patient.  Her husband states that he found her lying on the ground at the foot of the bed this morning at 9am. He states that he last saw her asleep in bed at 4am. He reports injury to nose and abrasion to her left shoulder. He also reports her having a cough for the last 3 days. Family state she is at her baseline.  Family state that her baseline is alert and oriented to person and sometimes place. Not to time. Family denies her use of blood thinners. They state she has a history of falls. 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 Family asking for possible rehab placement because it has been difficult taking care of her at home.  Family also stated that she recently has had thoughts of suicide without a plan. Family states that she has talked to her primary care provider and a "case worker" regarding it and not sure if this may have been an attempt.   Level 5 caveat.   The history is provided by the patient, the spouse and a relative (daughter). The history is limited by the condition of the patient. No language interpreter was used.  Fall   Facial Injury    Past Medical History:  Diagnosis Date  . Allergy   . Anemia    hx of   . Anxiety   . Chronic insomnia 04/22/2012  . COPD (chronic obstructive pulmonary disease) ([place])   . Dementia   . Diverticulosis of colon   . GERD (gastroesophageal reflux disease)   . Glaucoma   . Hormone replacement therapy (postmenopausal)   .  Hyperlipidemia   . Hypertension   . Low back pain   . Migraine headache    denies  . Mild obesity   . Osteoarthritis   . Osteopenia   . Osteoporosis   . Pneumonia    hx of 5 years ago   . Scoliosis   . Shortness of breath    with exertion   . Spontaneous pneumothorax    1968  . Umbilical hernia [id number]    Patient Active Problem List   Diagnosis Date Noted  . OAB (overactive bladder) 12/31/2016  . 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 Depression 11/20/2016  . Dementia without behavioral disturbance 11/20/2016  . Dehydration 10/02/2016  . Thoracic compression fracture ([place]) 09/30/2016  . Acute diarrhea 09/30/2016  . Acute cystitis without hematuria   . Fall   . Urinary retention 07/08/2016  . UTI (lower urinary tract infection) 05/15/2016  . Bilateral hand pain 06/05/2015  . Gait disorder 04/15/2015  . Orthostatic hypotension 04/10/2015  . Recurrent falls while walking 04/10/2015  . Memory loss 04/10/2015  . Abnormal x-ray 02/20/2015  . Cough 11/18/2014  . Thrush, oral 04/04/2014  . Umbilical hernia 0000000  . Benign skin lesion 07/24/2013  . Anemia, unspecified 12/21/2012  . Insomnia 04/22/2012  . Right  hip pain 12/13/2011  . Right knee pain 12/13/2011  . Preventative health care 12/11/2011  . DIVERTICULOSIS, COLON 11/18/2010  . ARTERIOVENOUS MALFORMATION, COLON 11/18/2010  . ANXIETY 12/02/2009  . COPD (chronic obstructive pulmonary disease) ([place]) 05/27/2009  . Diabetes ([place]) 02/06/2009  . FATIGUE 12/12/2008  . Hyperlipidemia 10/01/2007  . OBESITY, MILD 10/01/2007  . MIGRAINE HEADACHE 10/01/2007  . Essential hypertension 10/01/2007  . 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 Pneumothorax 10/01/2007  . GERD 10/01/2007  . Osteoarthritis 10/01/2007  . LOW BACK PAIN 10/01/2007  . OSTEOPOROSIS 10/01/2007    Past Surgical History:  Procedure Laterality Date  . EXCISION/RELEASE BURSA HIP  04/26/2012    Procedure: EXCISION/RELEASE BURSA HIP;  Surgeon: Gearlean Alf, MD;  Location: [facility];  Service: Orthopedics;  Laterality: Right;  . EYE SURGERY     - bil cataract surg and Retina surgery to left eye  . HERNIA REPAIR  [id number]   umbilical hernia  . HIP ADDUCTOR TENOTOMY    . IR GENERIC HISTORICAL  06/17/2016   IR RADIOLOGIST EVAL & MGMT 06/17/2016 [facility]  . IR GENERIC HISTORICAL  07/01/2016   IR VERTEBROPLASTY LUMBAR BX INC UNI/BIL INC/INJECT/IMAGING 07/01/2016 Luanne Bras, MD [facility]  . IR GENERIC HISTORICAL  07/16/2016   IR RADIOLOGIST EVAL & MGMT 07/16/2016 [facility]  . IR GENERIC HISTORICAL  10/22/2016   IR VERTEBROPLASTY CERV/THOR BX INC UNI/BIL INC/INJECT/IMAGING 10/22/2016 Luanne Bras, MD [facility]  . IR GENERIC HISTORICAL  10/22/2016   IR VERTEBROPLASTY EA ADDL (T&LS) BX INC UNI/BIL INC INJECT/IMAGING 10/22/2016 Luanne Bras, MD [facility]  . LUMBAR DISC SURGERY      x 2  . OTHER SURGICAL HISTORY     benign growth removed from right under ear at age 52   . SHOULDER ARTHROSCOPY     left shoulder  . TONSILLECTOMY    . TONSILLECTOMY    . WRIST FRACTURE SURGERY     plate in left wrist/ fractured 2 times    OB History    No data available       Home Medications    Prior to Admission medications   Medication Sig Start Date End Date Taking? Authorizing Provider  alendronate (FOSAMAX) 70 MG tablet Take 70 mg by mouth once a week. 11/24/16  Yes Historical Provider, MD  Ascorbic Acid (VITAMIN C) 1000 MG tablet Take 1,000 mg by mouth daily.   Yes Historical Provider, MD  atenolol (TENORMIN) 50 MG tablet TAKE 1 TABLET EVERY DAY 03/15/16  Yes Biagio Borg, MD  benazepril (LOTENSIN) 40 MG tablet TAKE 1 TABLET EVERY DAY 03/15/16  Yes Biagio Borg, MD  calcium carbonate (OS-CAL) 600 MG TABS Take 600 mg by mouth 2 (two) times daily with a meal.   Yes Historical Provider, MD  carbidopa-levodopa (SINEMET IR) 25-100 MG tablet Take 1 tablet by mouth 3  (three) times daily. Patient taking differently: Take 1.5 tablets by mouth 3 (three) times daily.  09/20/16  Yes Rebecca S Tat, DO  cetirizine (ZYRTEC) 10 MG tablet Take 10 mg by mouth daily.   Yes Historical Provider, MD  Cholecalciferol (VITAMIN D3) 1000 UNITS CAPS Take 1 capsule by mouth daily.   Yes Historical Provider, MD  denosumab (PROLIA) 60 MG/ML SOLN injection Inject 60 mg into the skin every 6 (six) months. Administer in upper arm, thigh, or abdomen   Yes Historical Provider, MD  divalproex (DEPAKOTE) 125 MG DR tablet Take 125 mg by mouth 2 (two) times  daily.   Yes Historical Provider, MD  fesoterodine (TOVIAZ) 4 MG TB24 tablet Take 1 tablet (4 mg total) by mouth daily. 12/30/16  Yes Biagio Borg, MD  folic acid (FOLVITE) 1 MG tablet Take 1 mg by mouth daily.   Yes Historical Provider, MD  gabapentin (NEURONTIN) 300 MG capsule Take 300 mg by mouth 3 (three) times daily.   Yes Historical Provider, MD  guaiFENesin (MUCINEX) 600 MG 12 hr tablet Take by mouth 2 (two) times daily as needed.    Yes Historical Provider, MD  HYDROcodone-acetaminophen (NORCO/VICODIN) 5-325 MG tablet Take 1 tablet by mouth every 8 (eight) hours as needed for moderate pain. 10/02/16  Yes Robbie Lis, MD  latanoprost (XALATAN) 0.005 % ophthalmic solution Place 1 drop into both eyes at bedtime.  10/09/13  Yes Historical Provider, MD  meloxicam (MOBIC) 7.5 MG tablet Take 15 mg by mouth daily.  07/19/16  Yes Historical Provider, MD  methotrexate 250 MG/10ML injection Inject 1 mL (25 mg total) into the skin once a week. 05/19/16  Yes Velvet Bathe, MD  Multiple Vitamin (MULTIVITAMIN) tablet Take 1 tablet by mouth daily.   Yes Historical Provider, MD  Omega-3 Fatty Acids (FISH OIL) 1200 MG CAPS Take 2 capsules by mouth daily.    Yes Historical Provider, MD  omeprazole (PRILOSEC) 20 MG capsule TAKE 2 CAPSULES EVERY DAY 03/15/16  Yes Biagio Borg, MD  polyethylene glycol [employer] / GLYCOLAX) packet Take 17 g by mouth daily as  needed.    Yes Historical Provider, MD  Sennosides-Docusate Sodium (SENNA S PO) Take 1 tablet by mouth daily.   Yes Historical Provider, MD  simvastatin (ZOCOR) 40 MG tablet TAKE 1 TABLET EVERY DAY  AT  6PM 03/15/16  Yes Biagio Borg, MD  timolol (TIMOPTIC) 0.5 % ophthalmic solution Place 1 drop into both eyes 2 (two) times daily.    Yes Historical Provider, MD  TURMERIC PO Take 1 capsule by mouth daily.   Yes Historical Provider, MD  venlafaxine XR (EFFEXOR-XR) 150 MG 24 hr capsule TAKE 1 CAPSULE EVERY DAY WITH BREAKFAST 11/18/16  Yes Biagio Borg, MD  zolpidem (AMBIEN) 5 MG tablet Take 1 tablet (5 mg total) by mouth at bedtime as needed for sleep. 11/18/16  Yes Biagio Borg, MD  VOLTAREN 1 % GEL Apply 1 application topically daily as needed. 01/03/17   Historical Provider, MD    Family History Family History  Problem Relation Age of Onset  . Hyperlipidemia Mother   . Heart disease Mother   . Diabetes Mother     Social History Social History  Substance Use Topics  . Smoking status: Former Smoker    Years: 20.00    Types: Cigarettes    Quit date: 11/29/1986  . Smokeless tobacco: Never Used  . Alcohol use No     Comment: one glass of wine every 3 months     Allergies   Codeine; Sulfonamide derivatives; Penicillins; and Percocet [oxycodone-acetaminophen]   Review of Systems Review of Systems  Unable to perform ROS: Dementia (Dementia and condition of patient)     Physical Exam Updated Vital Signs BP 161/86 (BP Location: Left Arm)   Pulse 77   Temp 97.6 F (36.4 C) (Oral)   Resp 16   Wt 53.5 kg   SpO2 96%   BMI 20.25 kg/m   Physical Exam  Constitutional: She appears well-developed and well-nourished.  Moves and processes information and commands slowly.   HENT:  Head: Normocephalic.  Mouth/Throat: Oropharynx is clear and moist.  There is dry blood originating from her both nostrils that extend to her right face and cheek.  There is no obvious wound, redness,  swelling, or tenderness on scalp. There is visible bruising around her left eye. It is nontender palpation. There is a small abrasion noted on her left side of nose. No lacerations or break in skin noted. Nose and nasal bone freely mobile.  Eyes: Pupils are equal, round, and reactive to light.  EOM only in horizontal plane.  She cannot look up or down or obliquely.  Daughter states this is NOT a new finding and is her baseline.   Neck: Normal range of motion.  No tenderness palpation. Good range of motion of neck.   Cardiovascular: Normal rate and normal heart sounds.   Pulmonary/Chest: Effort normal and breath sounds normal. No respiratory distress.  Abdominal: Soft. There is tenderness. There is no rebound and no guarding.  She exhibits mild tenderness to palpation diffusely on abdomen. No rebound or guarding.   Musculoskeletal: She exhibits no tenderness.  Her left shoulder has good range of motion. No obvious wound, redness, swelling, tenderness to midline cervical, thoracic and lumbar spine, paraspinal muscles, pelvis, upper extremities, lower extremities.  Neurological: She is alert.  Alert and oriented to person and place. Not to time.   Cranial Nerves:  III,IV, VI: ptosis not present, extra-ocular movements intact bilaterally, direct and consensual pupillary light reflexes intact bilaterally. Patient only able to look horizontally in both directions. She cannot look up or down or obliquely.  Daughter states this is NOT a new finding and is her baseline. V: facial sensation, jaw opening, and bite strength equal bilaterally VII: eyebrow raise, eyelid close, smile, frown, pucker equal bilaterally VIII: hearing grossly normal bilaterally  IX,X: palate elevation and swallowing intact XI: bilateral shoulder shrug and lateral head rotation equal and strong XII: midline tongue extension    Sensory intact.  Muscle strength 5/5 in upper extremities.  Lower extremities muscle strength 4+/5     Skin:  Abrasion to left shoulder. Nontender to palpation.  Nursing note and vitals reviewed.    ED Treatments / Results  Labs (all labs ordered are listed, but only abnormal results are displayed) Labs Reviewed  BASIC METABOLIC PANEL - Abnormal; Notable for the following:       Result Value   Sodium 134 (*)    Chloride 100 (*)    Glucose, Bld 105 (*)    All other components within normal limits  CBC - Abnormal; Notable for the following:    WBC 16.1 (*)    RBC 3.83 (*)    All other components within normal limits  URINALYSIS, ROUTINE W REFLEX MICROSCOPIC    EKG  EKG Interpretation  Date/Time:  Friday January 07 2017 14:15:43 EST Ventricular Rate:  77 PR Interval:    QRS Duration: 88 QT Interval:  363 QTC Calculation: 411 R Axis:   12 Text Interpretation:  Sinus rhythm Borderline T abnormalities, anterior leads since last tracing no significant change Confirmed by Eulis Foster  MD, [place] [phone]) on 01/07/2017 3:54:10 PM       Radiology Ct Head Wo Contrast  Result Date: 01/07/2017 CLINICAL DATA:  Unwitnessed fall.  Nasal deformity. EXAM: CT HEAD WITHOUT CONTRAST CT MAXILLOFACIAL WITHOUT CONTRAST CT CERVICAL SPINE WITHOUT CONTRAST TECHNIQUE: Multidetector CT imaging of the head, cervical spine, and maxillofacial structures were performed using the standard protocol without intravenous contrast. Multiplanar CT image reconstructions of the cervical spine  and maxillofacial structures were also generated. COMPARISON:  December 26, 2016 FINDINGS: CT HEAD FINDINGS Brain: No subdural, epidural, or subarachnoid hemorrhage. The ventricles and sulci are normal for age. The cerebellum, brainstem, and basal cisterns are normal. No mass, mass effect, or midline shift. White matter changes identified. No acute cortical ischemia or infarct. Vascular: Atherosclerotic changes in the intracranial carotid arteries. Skull: Nasal bone fractures, described below. No other bony abnormalities. Other: None.  CT MAXILLOFACIAL FINDINGS Osseous: Bilateral nasal bone fractures.  No other fractures. Orbits: Negative. No traumatic or inflammatory finding. Sinuses: Clear. Soft tissues: Negative. CT CERVICAL SPINE FINDINGS Alignment: Kyphosis is seen in the cervical spine centered at C4-5. 2.6 mm of anterolisthesis of C7 versus T1. No other significant malalignment. Skull base and vertebrae: Fragmentation of the C7 right facet is favored to be chronic from previous trauma. No evidence of acute fracture on this study. Soft tissues and spinal canal: No prevertebral fluid or swelling. No visible canal hematoma. Disc levels:  Multilevel degenerative changes. Upper chest: By a focal pleuroparenchymal opacities are most consistent with scarring, right greater than left. No other abnormality seen within the lung apices. Other: No other abnormalities. IMPRESSION: 1. No acute intracranial abnormality. 2. Multiple bilateral nasal bone fractures. 3. No acute fracture or traumatic malalignment in the cervical spine. Minimal anterolisthesis of C7 versus T1 is probably degenerative with no identified fracture. Fragmentation of the right C7 inferior facet has a chronic appearance as well. Electronically Signed   By: Dorise Bullion III M.D   On: 01/07/2017 14:18   Ct Cervical Spine Wo Contrast  Result Date: 01/07/2017 CLINICAL DATA:  Unwitnessed fall.  Nasal deformity. EXAM: CT HEAD WITHOUT CONTRAST CT MAXILLOFACIAL WITHOUT CONTRAST CT CERVICAL SPINE WITHOUT CONTRAST TECHNIQUE: Multidetector CT imaging of the head, cervical spine, and maxillofacial structures were performed using the standard protocol without intravenous contrast. Multiplanar CT image reconstructions of the cervical spine and maxillofacial structures were also generated. COMPARISON:  December 26, 2016 FINDINGS: CT HEAD FINDINGS Brain: No subdural, epidural, or subarachnoid hemorrhage. The ventricles and sulci are normal for age. The cerebellum, brainstem, and basal cisterns  are normal. No mass, mass effect, or midline shift. White matter changes identified. No acute cortical ischemia or infarct. Vascular: Atherosclerotic changes in the intracranial carotid arteries. Skull: Nasal bone fractures, described below. No other bony abnormalities. Other: None. CT MAXILLOFACIAL FINDINGS Osseous: Bilateral nasal bone fractures.  No other fractures. Orbits: Negative. No traumatic or inflammatory finding. Sinuses: Clear. Soft tissues: Negative. CT CERVICAL SPINE FINDINGS Alignment: Kyphosis is seen in the cervical spine centered at C4-5. 2.6 mm of anterolisthesis of C7 versus T1. No other significant malalignment. Skull base and vertebrae: Fragmentation of the C7 right facet is favored to be chronic from previous trauma. No evidence of acute fracture on this study. Soft tissues and spinal canal: No prevertebral fluid or swelling. No visible canal hematoma. Disc levels:  Multilevel degenerative changes. Upper chest: By a focal pleuroparenchymal opacities are most consistent with scarring, right greater than left. No other abnormality seen within the lung apices. Other: No other abnormalities. IMPRESSION: 1. No acute intracranial abnormality. 2. Multiple bilateral nasal bone fractures. 3. No acute fracture or traumatic malalignment in the cervical spine. Minimal anterolisthesis of C7 versus T1 is probably degenerative with no identified fracture. Fragmentation of the right C7 inferior facet has a chronic appearance as well. Electronically Signed   By: Dorise Bullion III M.D   On: 01/07/2017 14:18   Ct Maxillofacial Wo Cm  Result  Date: 01/07/2017 CLINICAL DATA:  Unwitnessed fall.  Nasal deformity. EXAM: CT HEAD WITHOUT CONTRAST CT MAXILLOFACIAL WITHOUT CONTRAST CT CERVICAL SPINE WITHOUT CONTRAST TECHNIQUE: Multidetector CT imaging of the head, cervical spine, and maxillofacial structures were performed using the standard protocol without intravenous contrast. Multiplanar CT image reconstructions  of the cervical spine and maxillofacial structures were also generated. COMPARISON:  December 26, 2016 FINDINGS: CT HEAD FINDINGS Brain: No subdural, epidural, or subarachnoid hemorrhage. The ventricles and sulci are normal for age. The cerebellum, brainstem, and basal cisterns are normal. No mass, mass effect, or midline shift. White matter changes identified. No acute cortical ischemia or infarct. Vascular: Atherosclerotic changes in the intracranial carotid arteries. Skull: Nasal bone fractures, described below. No other bony abnormalities. Other: None. CT MAXILLOFACIAL FINDINGS Osseous: Bilateral nasal bone fractures.  No other fractures. Orbits: Negative. No traumatic or inflammatory finding. Sinuses: Clear. Soft tissues: Negative. CT CERVICAL SPINE FINDINGS Alignment: Kyphosis is seen in the cervical spine centered at C4-5. 2.6 mm of anterolisthesis of C7 versus T1. No other significant malalignment. Skull base and vertebrae: Fragmentation of the C7 right facet is favored to be chronic from previous trauma. No evidence of acute fracture on this study. Soft tissues and spinal canal: No prevertebral fluid or swelling. No visible canal hematoma. Disc levels:  Multilevel degenerative changes. Upper chest: By a focal pleuroparenchymal opacities are most consistent with scarring, right greater than left. No other abnormality seen within the lung apices. Other: No other abnormalities. IMPRESSION: 1. No acute intracranial abnormality. 2. Multiple bilateral nasal bone fractures. 3. No acute fracture or traumatic malalignment in the cervical spine. Minimal anterolisthesis of C7 versus T1 is probably degenerative with no identified fracture. Fragmentation of the right C7 inferior facet has a chronic appearance as well. Electronically Signed   By: Dorise Bullion III M.D   On: 01/07/2017 14:18    Procedures Procedures (including critical care time)  Medications Ordered in ED Medications - No data to  display   Initial Impression / Assessment and Plan / ED Course  I have reviewed the triage vital signs and the nursing notes.  Pertinent labs & imaging results that were available during my care of the patient were reviewed by me and considered in my medical decision making (see chart for details).     Patient has evidence of multiple bilateral nasal bone fractures on Ct. no acute intracranial abnormality and no acute fracture or traumatic malalignment in the cervical spine. Patient has history of dementia. On exam patient is alert and oriented x 2 (person and place). Afebrile, hemodynamically stable, no apparent distress.  Mild leukocytosis noted. Other lab work similar to previous findings.  Awaiting urinalysis results and chest xray.   Patient seen and evaluated by Dr. Eulis Foster.   At shift change care was transferred to Dr. Eulis Foster who will follow pending studies, re-evaulate and determine disposition.      Final Clinical Impressions(s) / ED Diagnoses   Final diagnoses:  None    New Prescriptions New Prescriptions   No medications on file     [place], [place] 01/07/17 [place], [place] 01/07/17 1715    Daleen Bo, MD 01/10/17 [phone]

## 2017-01-07 NOTE — Telephone Encounter (Signed)
Verbals were given

## 2017-01-07 NOTE — Patient Outreach (Signed)
[place] [facility]) Care Management  [facility] Social Work  01/07/2017  Tara Neal [birth year] [id number]  Subjective:  "She had a bad night".  Objective: Family will be linked with community based services to aide in offering in home support as well as to consider long term options.   Encounter Medications:  No facility-administered encounter medications on file as of 01/06/2017.    Outpatient Encounter Prescriptions as of 01/06/2017  Medication Sig Note  . alendronate (FOSAMAX) 70 MG tablet Take 70 mg by mouth once a week.   . Ascorbic Acid (VITAMIN C) 1000 MG tablet Take 1,000 mg by mouth daily.   Marland Kitchen atenolol (TENORMIN) 50 MG tablet TAKE 1 TABLET EVERY DAY   . benazepril (LOTENSIN) 40 MG tablet TAKE 1 TABLET EVERY DAY   . calcium carbonate (OS-CAL) 600 MG TABS Take 600 mg by mouth 2 (two) times daily with a meal.   . carbidopa-levodopa (SINEMET IR) 25-100 MG tablet Take 1 tablet by mouth 3 (three) times daily. (Patient taking differently: Take 1.5 tablets by mouth 3 (three) times daily. )   . Cholecalciferol (VITAMIN D3) 1000 UNITS CAPS Take 1 capsule by mouth daily.   Marland Kitchen denosumab (PROLIA) 60 MG/ML SOLN injection Inject 60 mg into the skin every 6 (six) months. Administer in upper arm, thigh, or abdomen   . fesoterodine (TOVIAZ) 4 MG TB24 tablet Take 1 tablet (4 mg total) by mouth daily.   . folic acid (FOLVITE) 1 MG tablet Take 1 mg by mouth daily.   Marland Kitchen gabapentin (NEURONTIN) 300 MG capsule Take 300 mg by mouth 3 (three) times daily.   Marland Kitchen HYDROcodone-acetaminophen (NORCO/VICODIN) 5-325 MG tablet Take 1 tablet by mouth every 8 (eight) hours as needed for moderate pain.   Marland Kitchen latanoprost (XALATAN) 0.005 % ophthalmic solution Place 1 drop into both eyes at bedtime.    . meloxicam (MOBIC) 7.5 MG tablet Take 15 mg by mouth daily.    . methotrexate 250 MG/10ML injection Inject 1 mL (25 mg total) into the skin once a week. 09/29/2016: tuesdays  . Multiple Vitamin (MULTIVITAMIN) tablet Take 1  tablet by mouth daily.   . Omega-3 Fatty Acids (FISH OIL) 1200 MG CAPS Take 2 capsules by mouth daily.    Marland Kitchen omeprazole (PRILOSEC) 20 MG capsule TAKE 2 CAPSULES EVERY DAY   . polyethylene glycol (MIRALAX / GLYCOLAX) packet Take 17 g by mouth daily as needed.    Orlie Dakin Sodium (SENNA S PO) Take 1 tablet by mouth daily.   . simvastatin (ZOCOR) 40 MG tablet TAKE 1 TABLET EVERY DAY  AT  6PM   . timolol (TIMOPTIC) 0.5 % ophthalmic solution Place 1 drop into both eyes 2 (two) times daily.    . TURMERIC PO Take 1 capsule by mouth daily.   Marland Kitchen venlafaxine XR (EFFEXOR-XR) 150 MG 24 hr capsule TAKE 1 CAPSULE EVERY DAY WITH BREAKFAST   . [DISCONTINUED] divalproex (DEPAKOTE SPRINKLE) 125 MG capsule Take 125 mg by mouth 2 (two) times daily.   . [DISCONTINUED] Melatonin 3 MG TABS Take 6 mg by mouth at bedtime.   . cetirizine (ZYRTEC) 10 MG tablet Take 10 mg by mouth daily.   Marland Kitchen guaiFENesin (MUCINEX) 600 MG 12 hr tablet Take by mouth 2 (two) times daily as needed.    . [DISCONTINUED] zolpidem (AMBIEN) 5 MG tablet Take 1 tablet (5 mg total) by mouth at bedtime as needed for sleep.     Functional Status:  In your present state of health,  do you have any difficulty performing the following activities: 12/14/2016 10/22/2016  Hearing? Y N  Vision? N N  Difficulty concentrating or making decisions? Y N  Walking or climbing stairs? Y Y  Dressing or bathing? Y Y  Doing errands, shopping? Y -  Conservation officer, nature and eating ? Y -  Using the Toilet? Y -  In the past six months, have you accidently leaked urine? Y -  Do you have problems with loss of bowel control? N -  Managing your Medications? Y -  Managing your Finances? Y -  Housekeeping or managing your Housekeeping? Y -  Some recent data might be hidden    Fall/Depression Screening:  PHQ 2/9 Scores 01/06/2017 12/14/2016 11/29/2016 11/18/2016 03/16/2016 04/10/2015 06/19/2014  PHQ - 2 Score _0 0 - 0  PHQ- 9 Score 11 23 - - - - -  Exception  Documentation - - - - - Patient refusal -    Assessment: Met with patient and her husband of 4 years along with their daughter, [name], to complete CSW assessment. Family is wokring to provide in home support and care to patient in the home but is finding it challenging given patient's cognitive and physical needs.  Family wants to keep her at home (now) but are considering and willing to begin to prepare for long term placement plans.  Family also reports depression and concern that the Depakote she was on at SNF is needed to help with her mood, sleep and overall behaviors.  Plan: CSW has encouraged family to call PCP and inquire about refilling/re-starting the Depakote. Also, have encouraged/instructed family to apply for Medicaid for long term placement as they are hoping to place her in [facility] when "the time comes we have to". CSW also discussed community based services that may be of benefit including  Meals on wheels, transporation services, and in home aide support.   CSW will plan f/u call regarding services/support  In 2-3 weeks.    [facility] CM Care Plan Problem One   Flowsheet Row Most Recent Value  Care Plan Problem One  Patient reported to be depressed  Role Documenting the Problem One  Clinical Social Worker  Care Plan for Problem One  Active  [facility] CM Short Term Goal #1 (0-30 days)  Family will discuss with PCP resuming depakote within the next 2 weeks.  [facility] CM Short Term Goal #1 Start Date  01/06/17  Interventions for Short Term Goal #1  CSW encouraged family to call PCP to discuss resuming Depakote.    [facility] CM Care Plan Problem Two   Flowsheet Row Most Recent Value  Care Plan Problem Two  Family requesting community based services to offer support in home.   Role Documenting the Problem Two  Clinical Social Worker  Care Plan for Problem Two  Active  [facility] CM Short Term Goal #1 (0-30 days)  Family will be educated adn linked with community based services in the next 30 days.  [facility] CM  Short Term Goal #1 Start Date  01/06/17  Interventions for Short Term Goal #2   CSW discussed and will link/provide services.  [facility] CM Short Term Goal #2 (0-30 days)  Family will apply for Medicaid and contact [facility] regarding LTC placement,  [facility] CM Short Term Goal #2 Start Date  01/06/17  Interventions for Short Term Goal #2  CSW encoaurged family to apply for Medicaid and discuss placement with [facility].  Eduard Clos, MSW, Forest Worker  [place] [phone]

## 2017-01-07 NOTE — ED Triage Notes (Addendum)
Per EMS pt from home with unwitnessed fall; pt nasal deformity and abrasion to left shoulder. Pt alert and oriented per normal; hx of dementia.

## 2017-01-07 NOTE — ED Notes (Signed)
Pt in CT at present time 

## 2017-01-08 ENCOUNTER — Encounter (HOSPITAL_COMMUNITY): Payer: Self-pay

## 2017-01-08 ENCOUNTER — Inpatient Hospital Stay (HOSPITAL_COMMUNITY): Payer: Medicare Other

## 2017-01-08 DIAGNOSIS — M069 Rheumatoid arthritis, unspecified: Secondary | ICD-10-CM

## 2017-01-08 DIAGNOSIS — R079 Chest pain, unspecified: Secondary | ICD-10-CM

## 2017-01-08 DIAGNOSIS — J449 Chronic obstructive pulmonary disease, unspecified: Secondary | ICD-10-CM

## 2017-01-08 DIAGNOSIS — J181 Lobar pneumonia, unspecified organism: Secondary | ICD-10-CM

## 2017-01-08 DIAGNOSIS — I10 Essential (primary) hypertension: Secondary | ICD-10-CM

## 2017-01-08 LAB — INFLUENZA PANEL BY PCR (TYPE A & B)
INFLAPCR: NEGATIVE
INFLBPCR: NEGATIVE

## 2017-01-08 LAB — COMPREHENSIVE METABOLIC PANEL
ALK PHOS: 45 U/L (ref 38–126)
AST: 17 U/L (ref 15–41)
Albumin: 3.4 g/dL — ABNORMAL LOW (ref 3.5–5.0)
Anion gap: 8 (ref 5–15)
BILIRUBIN TOTAL: 0.9 mg/dL (ref 0.3–1.2)
BUN: 14 mg/dL (ref 6–20)
CALCIUM: 8.5 mg/dL — AB (ref 8.9–10.3)
CO2: 26 mmol/L (ref 22–32)
CREATININE: 0.62 mg/dL (ref 0.44–1.00)
Chloride: 101 mmol/L (ref 101–111)
GFR calc Af Amer: >60 mL/min (ref >60)
Glucose, Bld: 99 mg/dL (ref 65–99)
POTASSIUM: 3.4 mmol/L — AB (ref 3.5–5.1)
Sodium: 135 mmol/L (ref 135–145)
TOTAL PROTEIN: 5.9 g/dL — AB (ref 6.5–8.1)

## 2017-01-08 LAB — PHOSPHORUS: PHOSPHORUS: 2.8 mg/dL (ref 2.5–4.6)

## 2017-01-08 LAB — CBC
HCT: 34.7 % — ABNORMAL LOW (ref 36.0–46.0)
Hemoglobin: 11.6 g/dL — ABNORMAL LOW (ref 12.0–15.0)
MCH: 32.8 pg (ref 26.0–34.0)
MCHC: 33.4 g/dL (ref 30.0–36.0)
MCV: 98 fL (ref 78.0–100.0)
PLATELETS: 225 10*3/uL (ref 150–400)
RBC: 3.54 MIL/uL — ABNORMAL LOW (ref 3.87–5.11)
RDW: 13.7 % (ref 11.5–15.5)
WBC: 11.7 10*3/uL — AB (ref 4.0–10.5)

## 2017-01-08 LAB — TSH: TSH: 0.395 u[IU]/mL (ref 0.350–4.500)

## 2017-01-08 LAB — ECHOCARDIOGRAM COMPLETE: Weight: 1888 oz

## 2017-01-08 LAB — MAGNESIUM: MAGNESIUM: 1.6 mg/dL — AB (ref 1.7–2.4)

## 2017-01-08 LAB — TROPONIN I: Troponin I: <0.03 ng/mL (ref <0.03)

## 2017-01-08 LAB — PREALBUMIN: Prealbumin: 12.4 mg/dL — ABNORMAL LOW (ref 18–38)

## 2017-01-08 LAB — OCCULT BLOOD X 1 CARD TO LAB, STOOL: Fecal Occult Bld: NEGATIVE

## 2017-01-08 MED ORDER — MAGNESIUM SULFATE 2 GM/50ML IV SOLN
2.0000 g | Freq: Once | INTRAVENOUS | Status: AC
  Administered 2017-01-08: 2 g via INTRAVENOUS
  Filled 2017-01-08: qty 50

## 2017-01-08 MED ORDER — SODIUM CHLORIDE 0.9 % IV SOLN
INTRAVENOUS | Status: DC
  Administered 2017-01-08: 75 mL/h via INTRAVENOUS
  Administered 2017-01-09: 17:00:00 via INTRAVENOUS

## 2017-01-08 MED ORDER — TRAZODONE HCL 50 MG PO TABS
50.0000 mg | ORAL_TABLET | Freq: Once | ORAL | Status: AC
  Administered 2017-01-08: 50 mg via ORAL
  Filled 2017-01-08: qty 1

## 2017-01-08 MED ORDER — POTASSIUM CHLORIDE CRYS ER 20 MEQ PO TBCR
40.0000 meq | EXTENDED_RELEASE_TABLET | ORAL | Status: AC
  Administered 2017-01-08 (×2): 40 meq via ORAL
  Filled 2017-01-08 (×2): qty 2

## 2017-01-08 NOTE — Evaluation (Signed)
Physical Therapy Evaluation Patient Details Name: Tara Neal MRN: [id number] DOB: 05/28/1941 Today's Date: 01/08/2017   History of Present Illness  76 y.o. female admitted with fall (found on floor by husband), chest pain (troponins negative), CAP, dehydration, suicidal ideation. PMH of Parkinsons, dementia, RA, HTN, COPD, DM. Noted admission in Nov 2017 for fall, T10 & T11 compression fractures.   Clinical Impression  Pt admitted with above diagnosis. Pt currently with functional limitations due to the deficits listed below (see PT Problem List). Max assist for standing balance initially, then min/mod assist. Pt ambulated 25' with RW and min to mod assist for balance. ST-SNF recommended.  Pt will benefit from skilled PT to increase their independence and safety with mobility to allow discharge to the venue listed below.       Follow Up Recommendations SNF;Other (comment) (assist for mobility)    Equipment Recommendations  Wheelchair (measurements PT);Wheelchair cushion (measurements PT)    Recommendations for Other Services       Precautions / Restrictions Precautions Precautions: Fall Precaution Comments: h/o falls per chart, pt unable to provide details Restrictions Weight Bearing Restrictions: No      Mobility  Bed Mobility Overal bed mobility: Needs Assistance Bed Mobility: Supine to Sit     Supine to sit: Min assist     General bed mobility comments: min A to raise trunk and advance BLEs  Transfers Overall transfer level: Needs assistance Equipment used: Rolling walker (2 wheeled) Transfers: Sit to/from Stand Sit to Stand: Max assist         General transfer comment: max A to rise and steady 2* heavy lean to R, VCs hand placement  Ambulation/Gait Ambulation/Gait assistance: +2 safety/equipment;Min assist;Mod assist Ambulation Distance (Feet): 25 Feet Assistive device: Rolling walker (2 wheeled) Gait Pattern/deviations: Decreased step length -  right;Decreased step length - left;Decreased dorsiflexion - right;Decreased dorsiflexion - left   Gait velocity interpretation: Below normal speed for age/gender General Gait Details: pt walks on balls of feet (stated this is new as of 2 months ago, she has B ankle PF contractures), mod then min A for balance 2* posterior and R lateral lean, SaO2 97% on RA  Stairs            Wheelchair Mobility    Modified Rankin (Stroke Patients Only)       Balance Overall balance assessment: Needs assistance   Sitting balance-Leahy Scale: Good     Standing balance support: Bilateral upper extremity supported Standing balance-Leahy Scale: Zero Standing balance comment: heavy posterior and R lateral lean initially requiring max A, then min/mod A                             Pertinent Vitals/Pain Pain Assessment: No/denies pain    Home Living Family/patient expects to be discharged to:: Skilled nursing facility Living Arrangements: Spouse/significant other               Additional Comments: pt's husband has had multiple back surgeries     Prior Function Level of Independence: Needs assistance   Gait / Transfers Assistance Needed: husband assists with ambulation with RW  ADL's / Homemaking Assistance Needed: husband provides assistance with sponge bathing and dressing        Hand Dominance        Extremity/Trunk Assessment   Upper Extremity Assessment Upper Extremity Assessment: Defer to OT evaluation    Lower Extremity Assessment Lower Extremity Assessment: RLE deficits/detail;LLE deficits/detail RLE  Deficits / Details: B plantar flexion contractures, pt reports this is new as of 2 months ago and stated her Dr said its related to her Parkinson's-like disease; knee ext +4/5 B    Cervical / Trunk Assessment Cervical / Trunk Assessment: Kyphotic  Communication   Communication: HOH  Cognition Arousal/Alertness: Awake/alert Behavior During Therapy: WFL  for tasks assessed/performed Overall Cognitive Status: No family/caregiver present to determine baseline cognitive functioning (pt couldn't recall her birthdate, able to follow commands, not able to provide detailed history)                      General Comments      Exercises     Assessment/Plan    PT Assessment Patient needs continued PT services  PT Problem List Decreased range of motion;Decreased activity tolerance;Decreased balance;Decreased mobility;Decreased safety awareness          PT Treatment Interventions Gait training;DME instruction;Functional mobility training;Therapeutic exercise;Therapeutic activities;Patient/family education    PT Goals (Current goals can be found in the Care Plan section)  Acute Rehab PT Goals Patient Stated Goal: to walk better PT Goal Formulation: With patient Time For Goal Achievement: 01/22/17 Potential to Achieve Goals: Good    Frequency Min 3X/week   Barriers to discharge        Co-evaluation               End of Session Equipment Utilized During Treatment: Gait belt Activity Tolerance: Patient tolerated treatment well Patient left: in chair;with call bell/phone within reach;with nursing/sitter in room Nurse Communication: Mobility status         Time: 1046-1110 PT Time Calculation (min) (ACUTE ONLY): 24 min   Charges:   PT Evaluation $PT Eval Moderate Complexity: 1 Procedure PT Treatments $Gait Training: 8-22 mins   PT G Codes:        Philomena Doheny 01/08/2017, 11:29 AM [phone]

## 2017-01-08 NOTE — Progress Notes (Signed)
PROGRESS NOTE    Tara Neal   [id number]  DOB: [birth year]  DOA: 01/07/2017 PCP: [name], MD   Brief Narrative:  Tara Neal is a 76 y.o. female with medical history significant of dementia, Parkinson's, HLD, HTN,   COPD,   GERD,  , rhuematoid arthritis presents from home with family due to a fall that was unwitnessed. They also noted a cough for 3 days which has been nonproductive and 1 day of loose stools. She also complained of chest pain at home which lasted a few min. Patient has had suicidal ideation asking family to please kill her. She has endorsed wishes to end her life but no plan.  Family is worried that she has been trying to get to the gun or possibly pain medications. Family also reports very poor by mouth intake patient bases stopped eating or drinking. CXR showing LLL infiltrate.   Subjective: Confused. States she has back pain but when I examine her back, tells me she has no pain. No complaints of cough.   Assessment & Plan:   Principal Problem:   CAP (community acquired pneumonia) - WBC 16.1, LLL infiltrate on CXR and cough for 3 days prior to admission - cont Rocephin and Zithromax  Active Problems:  Chest pain at home - reported to last about 5 min by family - troponin checked and found to be negative- likely due to cough  Parkinson's disease with recurrent falls - PT eval - Sinemet  Hypokalemia and hypomagnesemia - replace and follow    Essential hypertension - Atenolol, Benazepril    COPD (chronic obstructive pulmonary disease) ?? - Mucinex on med list- no inhaler or nebs noted on home med list - currently not wheezing     Suicidal ideation - paranoid dementia? Cont Effexor, Depakote    Rheumatoid arthritis - Methotrexate, Mobic   DVT prophylaxis: Lovenox Code Status: DNR Family Communication:  Disposition Plan: home vs SNF after PT eval Consultants:   none Procedures:    Antimicrobials:  Anti-infectives    Start     Dose/Rate  Route Frequency Ordered Stop   01/08/17 1730  azithromycin (ZITHROMAX) 500 mg in dextrose 5 % 250 mL IVPB     500 mg 250 mL/hr over 60 Minutes Intravenous Every 24 hours 01/07/17 2033     01/08/17 1700  cefTRIAXone (ROCEPHIN) 1 g in dextrose 5 % 50 mL IVPB     1 g 100 mL/hr over 30 Minutes Intravenous Every 24 hours 01/07/17 2033     01/07/17 1730  cefTRIAXone (ROCEPHIN) 1 g in dextrose 5 % 50 mL IVPB     1 g 100 mL/hr over 30 Minutes Intravenous  Once 01/07/17 1723 01/07/17 1814   01/07/17 1730  azithromycin (ZITHROMAX) tablet 500 mg     500 mg Oral  Once 01/07/17 1723 01/07/17 1745       Objective: Vitals:   01/08/17 0431 01/08/17 0440 01/08/17 0950 01/08/17 1020  BP: (!) 96/48 (!) 102/53 118/68 118/68  Pulse: 72  71 71  Resp: 20     Temp: 97.8 F (36.6 C)  97.6 F (36.4 C) 97.6 F (36.4 C)  TempSrc: Oral  Oral Oral  SpO2:   99% 99%  Weight:        Intake/Output Summary (Last 24 hours) at 01/08/17 1104 Last data filed at 01/08/17 0518  Gross per 24 hour  Intake              925 ml  Output  3 ml  Net              922 ml   Filed Weights   01/07/17 1039  Weight: 53.5 kg (118 lb)    Examination: General exam: Appears comfortable , confused HEENT: PERRLA, oral mucosa moist, no sclera icterus or thrush- bruising and abrasions noted on face Respiratory system: Clear to auscultation. Respiratory effort normal. Cardiovascular system: S1 & S2 heard, RRR.  No murmurs  Gastrointestinal system: Abdomen soft, non-tender, nondistended. Normal bowel sound. No organomegaly Central nervous system: Alert-  No focal neurological deficits. Extremities: No cyanosis, clubbing or edema Skin: No rashes or ulcers Psychiatry:  Mood & affect appropriate.     Data Reviewed: I have personally reviewed following labs and imaging studies  CBC:  Recent Labs Lab 01/07/17 1417 01/08/17 0519  WBC 16.1* 11.7*  HGB 12.3 11.6*  HCT 36.8 34.7*  MCV 96.1 98.0  PLT 268 225     Basic Metabolic Panel:  Recent Labs Lab 01/07/17 1417 01/08/17 0519  NA 134* 135  K 3.8 3.4*  CL 100* 101  CO2 27 26  GLUCOSE 105* 99  BUN 18 14  CREATININE 0.72 0.62  CALCIUM 8.9 8.5*  MG  --  1.6*  PHOS  --  2.8   GFR: Estimated Creatinine Clearance: 51.3 mL/min (by C-G formula based on SCr of 0.62 mg/dL). Liver Function Tests:  Recent Labs Lab 01/08/17 0519  AST 17  ALT <5*  ALKPHOS 45  BILITOT 0.9  PROT 5.9*  ALBUMIN 3.4*   No results for input(s): LIPASE, AMYLASE in the last 168 hours. No results for input(s): AMMONIA in the last 168 hours. Coagulation Profile: No results for input(s): INR, PROTIME in the last 168 hours. Cardiac Enzymes:  Recent Labs Lab 01/07/17 1902 01/07/17 2243 01/08/17 0519  CKTOTAL 59  --   --   TROPONINI <0.03 <0.03 <0.03   BNP (last 3 results) No results for input(s): PROBNP in the last 8760 hours. HbA1C: No results for input(s): HGBA1C in the last 72 hours. CBG: No results for input(s): GLUCAP in the last 168 hours. Lipid Profile: No results for input(s): CHOL, HDL, LDLCALC, TRIG, CHOLHDL, LDLDIRECT in the last 72 hours. Thyroid Function Tests:  Recent Labs  01/08/17 0519  TSH 0.395   Anemia Panel: No results for input(s): VITAMINB12, FOLATE, FERRITIN, TIBC, IRON, RETICCTPCT in the last 72 hours. Urine analysis:    Component Value Date/Time   COLORURINE STRAW (A) 12/26/2016 1930   APPEARANCEUR CLEAR 12/26/2016 1930   LABSPEC 1.002 (L) 12/26/2016 1930   PHURINE 7.0 12/26/2016 1930   GLUCOSEU NEGATIVE 12/26/2016 1930   GLUCOSEU NEGATIVE 07/08/2016 [place] 12/26/2016 1930   BILIRUBINUR NEGATIVE 12/26/2016 1930   KETONESUR 5 (A) 12/26/2016 1930   PROTEINUR NEGATIVE 12/26/2016 1930   UROBILINOGEN 1.0 07/08/2016 1445   NITRITE NEGATIVE 12/26/2016 1930   LEUKOCYTESUR TRACE (A) 12/26/2016 1930   Sepsis Labs: @LABRCNTIP (procalcitonin:4,lacticidven:4) )No results found for this or any previous  visit (from the past 240 hour(s)).       Radiology Studies: Dg Chest 2 View  Result Date: 01/07/2017 CLINICAL DATA:  Coughing. EXAM: CHEST  2 VIEW COMPARISON:  12/18/2016 FINDINGS: AP and lateral views of the chest show new airspace disease left lung base, suspicious for pneumonia. Right lung appears hyperexpanded but clear. Cardiopericardial silhouette is at upper limits of normal for size. The visualized bony structures of the thorax are intact. Lower thoracic vertebral augmentation again noted. IMPRESSION: New  left base airspace disease, suspicious for pneumonia. Electronically Signed   By: Misty Stanley M.D.   On: 01/07/2017 16:12   Ct Head Wo Contrast  Result Date: 01/07/2017 CLINICAL DATA:  Unwitnessed fall.  Nasal deformity. EXAM: CT HEAD WITHOUT CONTRAST CT MAXILLOFACIAL WITHOUT CONTRAST CT CERVICAL SPINE WITHOUT CONTRAST TECHNIQUE: Multidetector CT imaging of the head, cervical spine, and maxillofacial structures were performed using the standard protocol without intravenous contrast. Multiplanar CT image reconstructions of the cervical spine and maxillofacial structures were also generated. COMPARISON:  December 26, 2016 FINDINGS: CT HEAD FINDINGS Brain: No subdural, epidural, or subarachnoid hemorrhage. The ventricles and sulci are normal for age. The cerebellum, brainstem, and basal cisterns are normal. No mass, mass effect, or midline shift. White matter changes identified. No acute cortical ischemia or infarct. Vascular: Atherosclerotic changes in the intracranial carotid arteries. Skull: Nasal bone fractures, described below. No other bony abnormalities. Other: None. CT MAXILLOFACIAL FINDINGS Osseous: Bilateral nasal bone fractures.  No other fractures. Orbits: Negative. No traumatic or inflammatory finding. Sinuses: Clear. Soft tissues: Negative. CT CERVICAL SPINE FINDINGS Alignment: Kyphosis is seen in the cervical spine centered at C4-5. 2.6 mm of anterolisthesis of C7 versus T1. No other  significant malalignment. Skull base and vertebrae: Fragmentation of the C7 right facet is favored to be chronic from previous trauma. No evidence of acute fracture on this study. Soft tissues and spinal canal: No prevertebral fluid or swelling. No visible canal hematoma. Disc levels:  Multilevel degenerative changes. Upper chest: By a focal pleuroparenchymal opacities are most consistent with scarring, right greater than left. No other abnormality seen within the lung apices. Other: No other abnormalities. IMPRESSION: 1. No acute intracranial abnormality. 2. Multiple bilateral nasal bone fractures. 3. No acute fracture or traumatic malalignment in the cervical spine. Minimal anterolisthesis of C7 versus T1 is probably degenerative with no identified fracture. Fragmentation of the right C7 inferior facet has a chronic appearance as well. Electronically Signed   By: Dorise Bullion III M.D   On: 01/07/2017 14:18   Ct Cervical Spine Wo Contrast  Result Date: 01/07/2017 CLINICAL DATA:  Unwitnessed fall.  Nasal deformity. EXAM: CT HEAD WITHOUT CONTRAST CT MAXILLOFACIAL WITHOUT CONTRAST CT CERVICAL SPINE WITHOUT CONTRAST TECHNIQUE: Multidetector CT imaging of the head, cervical spine, and maxillofacial structures were performed using the standard protocol without intravenous contrast. Multiplanar CT image reconstructions of the cervical spine and maxillofacial structures were also generated. COMPARISON:  December 26, 2016 FINDINGS: CT HEAD FINDINGS Brain: No subdural, epidural, or subarachnoid hemorrhage. The ventricles and sulci are normal for age. The cerebellum, brainstem, and basal cisterns are normal. No mass, mass effect, or midline shift. White matter changes identified. No acute cortical ischemia or infarct. Vascular: Atherosclerotic changes in the intracranial carotid arteries. Skull: Nasal bone fractures, described below. No other bony abnormalities. Other: None. CT MAXILLOFACIAL FINDINGS Osseous: Bilateral  nasal bone fractures.  No other fractures. Orbits: Negative. No traumatic or inflammatory finding. Sinuses: Clear. Soft tissues: Negative. CT CERVICAL SPINE FINDINGS Alignment: Kyphosis is seen in the cervical spine centered at C4-5. 2.6 mm of anterolisthesis of C7 versus T1. No other significant malalignment. Skull base and vertebrae: Fragmentation of the C7 right facet is favored to be chronic from previous trauma. No evidence of acute fracture on this study. Soft tissues and spinal canal: No prevertebral fluid or swelling. No visible canal hematoma. Disc levels:  Multilevel degenerative changes. Upper chest: By a focal pleuroparenchymal opacities are most consistent with scarring, right greater than left. No other  abnormality seen within the lung apices. Other: No other abnormalities. IMPRESSION: 1. No acute intracranial abnormality. 2. Multiple bilateral nasal bone fractures. 3. No acute fracture or traumatic malalignment in the cervical spine. Minimal anterolisthesis of C7 versus T1 is probably degenerative with no identified fracture. Fragmentation of the right C7 inferior facet has a chronic appearance as well. Electronically Signed   By: Dorise Bullion III M.D   On: 01/07/2017 14:18   Ct Maxillofacial Wo Cm  Result Date: 01/07/2017 CLINICAL DATA:  Unwitnessed fall.  Nasal deformity. EXAM: CT HEAD WITHOUT CONTRAST CT MAXILLOFACIAL WITHOUT CONTRAST CT CERVICAL SPINE WITHOUT CONTRAST TECHNIQUE: Multidetector CT imaging of the head, cervical spine, and maxillofacial structures were performed using the standard protocol without intravenous contrast. Multiplanar CT image reconstructions of the cervical spine and maxillofacial structures were also generated. COMPARISON:  December 26, 2016 FINDINGS: CT HEAD FINDINGS Brain: No subdural, epidural, or subarachnoid hemorrhage. The ventricles and sulci are normal for age. The cerebellum, brainstem, and basal cisterns are normal. No mass, mass effect, or midline shift.  White matter changes identified. No acute cortical ischemia or infarct. Vascular: Atherosclerotic changes in the intracranial carotid arteries. Skull: Nasal bone fractures, described below. No other bony abnormalities. Other: None. CT MAXILLOFACIAL FINDINGS Osseous: Bilateral nasal bone fractures.  No other fractures. Orbits: Negative. No traumatic or inflammatory finding. Sinuses: Clear. Soft tissues: Negative. CT CERVICAL SPINE FINDINGS Alignment: Kyphosis is seen in the cervical spine centered at C4-5. 2.6 mm of anterolisthesis of C7 versus T1. No other significant malalignment. Skull base and vertebrae: Fragmentation of the C7 right facet is favored to be chronic from previous trauma. No evidence of acute fracture on this study. Soft tissues and spinal canal: No prevertebral fluid or swelling. No visible canal hematoma. Disc levels:  Multilevel degenerative changes. Upper chest: By a focal pleuroparenchymal opacities are most consistent with scarring, right greater than left. No other abnormality seen within the lung apices. Other: No other abnormalities. IMPRESSION: 1. No acute intracranial abnormality. 2. Multiple bilateral nasal bone fractures. 3. No acute fracture or traumatic malalignment in the cervical spine. Minimal anterolisthesis of C7 versus T1 is probably degenerative with no identified fracture. Fragmentation of the right C7 inferior facet has a chronic appearance as well. Electronically Signed   By: Dorise Bullion III M.D   On: 01/07/2017 14:18      Scheduled Meds: . atenolol  50 mg Oral Daily  . azithromycin  500 mg Intravenous Q24H  . benazepril  40 mg Oral Daily  . carbidopa-levodopa  1.5 tablet Oral TID  . cefTRIAXone (ROCEPHIN)  IV  1 g Intravenous Q24H  . divalproex  125 mg Oral BID  . feeding supplement (ENSURE ENLIVE)  237 mL Oral BID BM  . fesoterodine  4 mg Oral Daily  . folic acid  1 mg Oral Daily  . gabapentin  300 mg Oral TID  . latanoprost  1 drop Both Eyes QHS  .  potassium chloride  40 mEq Oral Q4H  . simvastatin  40 mg Oral q1800  . sodium chloride flush  3 mL Intravenous Q12H  . timolol  1 drop Both Eyes BID  . venlafaxine XR  150 mg Oral Q breakfast   Continuous Infusions:   LOS: 1 day    Time spent in minutes: 47    [place], MD [facility] Pager: [email or web address] Password [email or web address] 01/08/2017, 11:04 AM

## 2017-01-08 NOTE — Clinical Social Work Placement (Signed)
Per RN, awaiting Palliative Care Consult.    Raynaldo Opitz, [facility] Clinical Social Worker cell #: [phone]    CLINICAL SOCIAL WORK PLACEMENT  NOTE  Date:  01/08/2017  Patient Details  Name: Tara Neal MRN: [id number] Date of Birth: 06-08-1941  Clinical Social Work is seeking post-discharge placement for this patient at the [place] level of care (*CSW will initial, date and re-position this form in  chart as items are completed):  Yes   Patient/family provided with [facility] Work Department's list of facilities offering this level of care within the geographic area requested by the patient (or if unable, by the patient's family).  Yes   Patient/family informed of their freedom to choose among providers that offer the needed level of care, that participate in Medicare, Medicaid or managed care program needed by the patient, have an available bed and are willing to accept the patient.  Yes   Patient/family informed of [facility]'s ownership interest in [facility] and [facility], as well as of the fact that they are under no obligation to receive care at these facilities.  PASRR submitted to EDS on       PASRR number received on       Existing PASRR number confirmed on 01/08/17     FL2 transmitted to all facilities in geographic area requested by pt/family on 01/08/17     FL2 transmitted to all facilities within larger geographic area on       Patient informed that his/her managed care company has contracts with or will negotiate with certain facilities, including the following:            Patient/family informed of bed offers received.  Patient chooses bed at       Physician recommends and patient chooses bed at      Patient to be transferred to   on  .  Patient to be transferred to facility by       Patient family notified on   of transfer.  Name of family member notified:         PHYSICIAN       Additional Comment:    _______________________________________________ Standley Brooking, LCSW 01/08/2017, 12:53 PM

## 2017-01-08 NOTE — Evaluation (Signed)
Occupational Therapy Evaluation Patient Details Name: Tara Neal MRN: [id number] DOB: [birth year] Today's Date: 01/08/2017    History of Present Illness 76 y.o. female admitted with fall (found on floor by husband), chest pain (troponins negative), CAP, dehydration, suicidal ideation. PMH of Parkinsons, dementia, RA, HTN, COPD, DM. Noted admission in Nov 2017 for fall, T10 & T11 compression fractures.    Clinical Impression   Pt admitted with the above diagnoses and presents with below problem list. Pt will benefit from continued acute OT to address the below listed deficits and maximize independence with basic ADLs prior to d/c to next venue. PTA pt was needing some assistance with bathing and dressing. Pt currently min to max A with most ADLs, +2 s/e for functional mobility (chair follow), +1 max A for SPT to [facility]. Family in agreement with SNF recommendation as d/c plan.       Follow Up Recommendations  SNF    Equipment Recommendations  Other (comment) (defer to next venue)    Recommendations for Other Services       Precautions / Restrictions Precautions Precautions: Fall Precaution Comments: h/o falls per chart, pt unable to provide details Restrictions Weight Bearing Restrictions: No      Mobility Bed Mobility Overal bed mobility: Needs Assistance Bed Mobility: Supine to Sit;Sit to Supine     Supine to sit: Min assist Sit to supine: Max assist   General bed mobility comments: fatigued at end of session  Transfers Overall transfer level: Needs assistance Equipment used: Rolling walker (2 wheeled) Transfers: Sit to/from Stand Sit to Stand: Max assist         General transfer comment: max A to rise and steady 2* heavy lean to R, VCs hand placement    Balance Overall balance assessment: Needs assistance;History of Falls Sitting-balance support: Bilateral upper extremity supported;Feet supported;No upper extremity supported Sitting balance-Leahy Scale:  Good Sitting balance - Comments: sat EOB for several minutes   Standing balance support: Bilateral upper extremity supported Standing balance-Leahy Scale: Zero Standing balance comment: heavy posterior and R lateral lean initially requiring max A, then min/mod A                            ADL Overall ADL's : Needs assistance/impaired Eating/Feeding: Set up;Sitting   Grooming: Set up;Sitting;Minimal assistance;Supervision/safety Grooming Details (indicate cue type and reason): min A for throughness Upper Body Bathing: Minimal assistance;Min guard;Sitting Upper Body Bathing Details (indicate cue type and reason): min A for throughness Lower Body Bathing: Maximal assistance;+2 for safety/equipment;Sit to/from stand Lower Body Bathing Details (indicate cue type and reason): 1 person to assist with balance, 1 to assist with bathing task Upper Body Dressing : Minimal assistance;Sitting;Set up   Lower Body Dressing: Maximal assistance;+2 for safety/equipment;Sit to/from stand Lower Body Dressing Details (indicate cue type and reason): 1 person to assist with balance, 1 to assist with dressing Toilet Transfer: Maximal assistance;Stand-pivot;BSC;RW Toilet Transfer Details (indicate cue type and reason): NT reports she has been assisting pt to BSC, SPT, rw, +1 Toileting- Clothing Manipulation and Hygiene: Maximal assistance;Sit to/from stand;+2 for safety/equipment Toileting - Clothing Manipulation Details (indicate cue type and reason): 1 person assisted with balance and 1 completed pericare with pt standing by bed.    Tub/Shower Transfer Details (indicate cue type and reason): sponge bathes at baseline per pt report Functional mobility during ADLs: Maximal assistance;Rolling walker;Moderate assistance General ADL Comments: Pt completed bed mobility and side steps toward left  and right. Pt fatigued quickly and initiating sitting down with max A needed to control descent. Sitter reports  pt not moving as well during this session compared to PT session earlier. Multimodal cueing needed at times for transfers and mobility with rw. Spouse and daughter entered room at end of session. Discussed d/c plan. Pt's family in agreement with SNF recommendation.     Vision     Perception     Praxis      Pertinent Vitals/Pain Pain Assessment: No/denies pain     Hand Dominance     Extremity/Trunk Assessment Upper Extremity Assessment Upper Extremity Assessment: Generalized weakness   Lower Extremity Assessment Lower Extremity Assessment: Defer to PT evaluation RLE Deficits / Details: B plantar flexion contractures, pt reports this is new as of 2 months ago and stated her Dr said its related to her Parkinson's-like disease; knee ext +4/5 B   Cervical / Trunk Assessment Cervical / Trunk Assessment: Kyphotic   Communication Communication Communication: HOH   Cognition Arousal/Alertness: Awake/alert Behavior During Therapy: WFL for tasks assessed/performed Overall Cognitive Status: History of cognitive impairments - at baseline                 General Comments: able to follow 1-step commands   General Comments       Exercises       Shoulder Instructions      Home Living Family/patient expects to be discharged to:: Skilled nursing facility Living Arrangements: Spouse/significant other                               Additional Comments: pt's husband has had multiple back surgeries       Prior Functioning/Environment Level of Independence: Needs assistance  Gait / Transfers Assistance Needed: husband assists with ambulation with RW ADL's / Homemaking Assistance Needed: husband provides assistance with sponge bathing and dressing            OT Problem List: Impaired balance (sitting and/or standing);Decreased strength;Decreased activity tolerance;Decreased cognition;Decreased safety awareness;Decreased knowledge of use of DME or AE;Decreased  knowledge of precautions   OT Treatment/Interventions: Self-care/ADL training;DME and/or AE instruction;Therapeutic activities;Patient/family education;Balance training    OT Goals(Current goals can be found in the care plan section) Acute Rehab OT Goals Patient Stated Goal: to walk better OT Goal Formulation: With patient/family Time For Goal Achievement: 01/22/17 Potential to Achieve Goals: Good ADL Goals Pt Will Perform Grooming: sitting;standing;with set-up;with min assist (1-2 task) Pt Will Perform Upper Body Bathing: with min assist;sitting Pt Will Perform Lower Body Bathing: with min assist;sit to/from stand Pt Will Transfer to Toilet: with min assist;ambulating;bedside commode Pt Will Perform Toileting - Clothing Manipulation and hygiene: with min assist;sit to/from stand  OT Frequency: Min 2X/week   Barriers to D/C:            Co-evaluation              End of Session Equipment Utilized During Treatment: Gait belt;Rolling walker  Activity Tolerance: Patient limited by fatigue;Patient tolerated treatment well Patient left: in bed;with call bell/phone within reach;with nursing/sitter in room;with family/visitor present   Time: [id number] OT Time Calculation (min): 33 min Charges:  OT General Charges $OT Visit: 1 Procedure OT Evaluation $OT Eval Moderate Complexity: 1 Procedure OT Treatments $Self Care/Home Management : 8-22 mins G-Codes:    Hortencia Pilar 02-01-17, 1:33 PM

## 2017-01-08 NOTE — Progress Notes (Signed)
  Echocardiogram 2D Echocardiogram has been performed.  Tresa Res 01/08/2017, 9:47 AM

## 2017-01-08 NOTE — NC FL2 (Signed)
[place] LEVEL OF CARE SCREENING TOOL     IDENTIFICATION  Patient Name: Tara Neal Birthdate: 1941/07/08 Sex: female Admission Date (Current Location): 01/07/2017  [facility] and [place] Number:  Herbalist and Address:  [facility],  [place] [address], [place]      Provider Number: [id number]  Attending Physician Name and Address:  Debbe Odea, MD  Relative Name and Phone Number:       Current Level of Care: Hospital Recommended Level of Care: [place] Prior Approval Number:    Date Approved/Denied:   PASRR Number: [id number]  Discharge Plan: SNF    Current Diagnoses: Patient Active Problem List   Diagnosis Date Noted  . CAP (community acquired pneumonia) 01/07/2017  . Suicidal ideation 01/07/2017  . Rheumatoid arthritis ([place]) 01/07/2017  . OAB (overactive bladder) 12/31/2016  . 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 Depression 11/20/2016  . Dementia without behavioral disturbance 11/20/2016  . Dehydration 10/02/2016  . Thoracic compression fracture ([place]) 09/30/2016  . Acute diarrhea 09/30/2016  . Acute cystitis without hematuria   . Fall   . Urinary retention 07/08/2016  . UTI (lower urinary tract infection) 05/15/2016  . Bilateral hand pain 06/05/2015  . Gait disorder 04/15/2015  . Orthostatic hypotension 04/10/2015  . Recurrent falls while walking 04/10/2015  . Memory loss 04/10/2015  . Abnormal x-ray 02/20/2015  . Cough 11/18/2014  . Thrush, oral 04/04/2014  . Umbilical hernia 0000000  . Benign skin lesion 07/24/2013  . Anemia, unspecified 12/21/2012  . Insomnia 04/22/2012  . Right hip pain 12/13/2011  . Right knee pain 12/13/2011  . Preventative health care 12/11/2011  . DIVERTICULOSIS, COLON 11/18/2010  . ARTERIOVENOUS MALFORMATION, COLON 11/18/2010  . ANXIETY 12/02/2009  . COPD (chronic obstructive pulmonary disease) ([place]) 05/27/2009  . Diabetes ([place])  02/06/2009  . FATIGUE 12/12/2008  . Hyperlipidemia 10/01/2007  . OBESITY, MILD 10/01/2007  . MIGRAINE HEADACHE 10/01/2007  . Essential hypertension 10/01/2007  . 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 Pneumothorax 10/01/2007  . GERD 10/01/2007  . Osteoarthritis 10/01/2007  . LOW BACK PAIN 10/01/2007  . OSTEOPOROSIS 10/01/2007    Orientation RESPIRATION BLADDER Height & Weight     Self, Time, Place  Normal Incontinent Weight: 118 lb (53.5 kg) Height:     BEHAVIORAL SYMPTOMS/MOOD NEUROLOGICAL BOWEL NUTRITION STATUS      Incontinent Diet (Heart)  AMBULATORY STATUS COMMUNICATION OF NEEDS Skin   Extensive Assist Verbally Normal                       Personal Care Assistance Level of Assistance  Bathing, Dressing Bathing Assistance: Limited assistance   Dressing Assistance: Limited assistance     Functional Limitations Info             SPECIAL CARE FACTORS FREQUENCY  PT (By licensed PT), OT (By licensed OT)     PT Frequency: 5 OT Frequency: 5            Contractures      Additional Factors Info  Code Status, Allergies Code Status Info: DNR Allergies Info: Codeine, Sulfonamide Derivatives, Ambien Zolpidem Tartrate, Penicillins, Percocet Oxycodone-acetaminophen           Current Medications (01/08/2017):  This is the current hospital active medication list Current Facility-Administered Medications  Medication Dose Route Frequency Provider Last Rate Last Dose  . 0.9 %  sodium chloride infusion   Intravenous Continuous Debbe Odea, MD      .  acetaminophen (TYLENOL) tablet 650 mg  650 mg Oral Q6H PRN Toy Baker, MD       Or  . acetaminophen (TYLENOL) suppository 650 mg  650 mg Rectal Q6H PRN Toy Baker, MD      . albuterol (PROVENTIL) (2.5 MG/3ML) 0.083% nebulizer solution 2.5 mg  2.5 mg Nebulization Q2H PRN Toy Baker, MD      . atenolol (TENORMIN) tablet 50 mg  50 mg Oral Daily Toy Baker, MD   50 mg at  01/08/17 0946  . azithromycin (ZITHROMAX) 500 mg in dextrose 5 % 250 mL IVPB  500 mg Intravenous Q24H Toy Baker, MD      . benazepril (LOTENSIN) tablet 40 mg  40 mg Oral Daily Toy Baker, MD   40 mg at 01/08/17 0946  . carbidopa-levodopa (SINEMET IR) 25-100 MG per tablet immediate release 1.5 tablet  1.5 tablet Oral TID Toy Baker, MD   1.5 tablet at 01/08/17 0946  . cefTRIAXone (ROCEPHIN) 1 g in dextrose 5 % 50 mL IVPB  1 g Intravenous Q24H Toy Baker, MD      . divalproex (DEPAKOTE) DR tablet 125 mg  125 mg Oral BID Toy Baker, MD   125 mg at 01/08/17 0947  . feeding supplement (ENSURE ENLIVE) (ENSURE ENLIVE) liquid 237 mL  237 mL Oral BID BM Anastassia Doutova, MD   237 mL at 01/08/17 1000  . fesoterodine (TOVIAZ) tablet 4 mg  4 mg Oral Daily Toy Baker, MD   4 mg at 01/08/17 0946  . folic acid (FOLVITE) tablet 1 mg  1 mg Oral Daily Toy Baker, MD   1 mg at 01/08/17 0947  . gabapentin (NEURONTIN) capsule 300 mg  300 mg Oral TID Toy Baker, MD   300 mg at 01/08/17 0947  . guaiFENesin (MUCINEX) 12 hr tablet 600 mg  600 mg Oral BID PRN Toy Baker, MD      . HYDROcodone-acetaminophen (NORCO/VICODIN) 5-325 MG per tablet 1 tablet  1 tablet Oral Q8H PRN Toy Baker, MD      . latanoprost (XALATAN) 0.005 % ophthalmic solution 1 drop  1 drop Both Eyes QHS Toy Baker, MD   1 drop at 01/07/17 2253  . magnesium sulfate IVPB 2 g 50 mL  2 g Intravenous Once Debbe Odea, MD   2 g at 01/08/17 1220  . ondansetron (ZOFRAN) tablet 4 mg  4 mg Oral Q6H PRN Toy Baker, MD       Or  . ondansetron (ZOFRAN) injection 4 mg  4 mg Intravenous Q6H PRN Toy Baker, MD      . simvastatin (ZOCOR) tablet 40 mg  40 mg Oral q1800 Toy Baker, MD      . sodium chloride flush (NS) 0.9 % injection 3 mL  3 mL Intravenous Q12H Toy Baker, MD   3 mL at 01/07/17 2334  . timolol (TIMOPTIC) 0.5 % ophthalmic solution 1  drop  1 drop Both Eyes BID Toy Baker, MD   1 drop at 01/08/17 0947  . venlafaxine XR (EFFEXOR-XR) 24 hr capsule 150 mg  150 mg Oral Q breakfast Toy Baker, MD   150 mg at 01/08/17 0820     Discharge Medications: Please see discharge summary for a list of discharge medications.  Relevant Imaging Results:  Relevant Lab Results:   Additional Information [id number]  Standley Brooking, LCSW

## 2017-01-09 DIAGNOSIS — R627 Adult failure to thrive: Secondary | ICD-10-CM

## 2017-01-09 DIAGNOSIS — D472 Monoclonal gammopathy: Secondary | ICD-10-CM

## 2017-01-09 DIAGNOSIS — E860 Dehydration: Secondary | ICD-10-CM

## 2017-01-09 DIAGNOSIS — R45851 Suicidal ideations: Secondary | ICD-10-CM

## 2017-01-09 DIAGNOSIS — R296 Repeated falls: Secondary | ICD-10-CM

## 2017-01-09 LAB — BASIC METABOLIC PANEL
ANION GAP: 4 — AB (ref 5–15)
BUN: 14 mg/dL (ref 6–20)
CO2: 26 mmol/L (ref 22–32)
Calcium: 8.3 mg/dL — ABNORMAL LOW (ref 8.9–10.3)
Chloride: 107 mmol/L (ref 101–111)
Creatinine, Ser: 0.65 mg/dL (ref 0.44–1.00)
GLUCOSE: 99 mg/dL (ref 65–99)
POTASSIUM: 4.2 mmol/L (ref 3.5–5.1)
SODIUM: 137 mmol/L (ref 135–145)

## 2017-01-09 LAB — URINALYSIS, ROUTINE W REFLEX MICROSCOPIC
BILIRUBIN URINE: NEGATIVE
GLUCOSE, UA: NEGATIVE mg/dL
HGB URINE DIPSTICK: NEGATIVE
Ketones, ur: 5 mg/dL — AB
Leukocytes, UA: NEGATIVE
NITRITE: NEGATIVE
PH: 6 (ref 5.0–8.0)
Protein, ur: NEGATIVE mg/dL
SPECIFIC GRAVITY, URINE: 1.011 (ref 1.005–1.030)

## 2017-01-09 LAB — CBC
HEMATOCRIT: 31.5 % — AB (ref 36.0–46.0)
HEMOGLOBIN: 10.6 g/dL — AB (ref 12.0–15.0)
MCH: 33.1 pg (ref 26.0–34.0)
MCHC: 33.7 g/dL (ref 30.0–36.0)
MCV: 98.4 fL (ref 78.0–100.0)
Platelets: 220 10*3/uL (ref 150–400)
RBC: 3.2 MIL/uL — ABNORMAL LOW (ref 3.87–5.11)
RDW: 13.6 % (ref 11.5–15.5)
WBC: 7.1 10*3/uL (ref 4.0–10.5)

## 2017-01-09 LAB — HEMOGLOBIN A1C
Hgb A1c MFr Bld: 5.1 % (ref 4.8–5.6)
Mean Plasma Glucose: 100 mg/dL

## 2017-01-09 LAB — CREATININE, URINE, RANDOM: CREATININE, URINE: 48.6 mg/dL

## 2017-01-09 LAB — STREP PNEUMONIAE URINARY ANTIGEN: Strep Pneumo Urinary Antigen: NEGATIVE

## 2017-01-09 LAB — SODIUM, URINE, RANDOM: SODIUM UR: 86 mmol/L

## 2017-01-09 LAB — MAGNESIUM: MAGNESIUM: 1.9 mg/dL (ref 1.7–2.4)

## 2017-01-09 MED ORDER — AZITHROMYCIN 250 MG PO TABS
500.0000 mg | ORAL_TABLET | Freq: Every day | ORAL | Status: DC
  Administered 2017-01-09 – 2017-01-10 (×2): 500 mg via ORAL
  Filled 2017-01-09 (×2): qty 2

## 2017-01-09 NOTE — Progress Notes (Signed)
PROGRESS NOTE    Tara Neal   [id number]  DOB: 07-Mar-1941  DOA: 01/07/2017 PCP: [name], MD   Brief Narrative:  Tara Neal is a 76 y.o. female with medical history significant of dementia, Parkinson's, HLD, HTN,   COPD,   GERD,  , rhuematoid arthritis presents from home with family due to a fall that was unwitnessed. They also noted a cough for 3 days which has been nonproductive and 1 day of loose stools. She also complained of chest pain at home which lasted a few min. Patient has had suicidal ideation asking family to please kill her. She has endorsed wishes to end her life but no plan.  Family is worried that she has been trying to get to the gun or possibly pain medications. Family also reports very poor by mouth intake patient bases stopped eating or drinking. CXR showing LLL infiltrate.   Subjective: Per nursing aid, she has been eating well and has begun to get out of bed with PT.   Assessment & Plan:   Principal Problem:   CAP (community acquired pneumonia) - WBC 16.1, LLL infiltrate on CXR and cough for 3 days prior to admission - cont Rocephin and Zithromax- WBC count has normalized and she has no further cough  Active Problems:  Chest pain at home - reported to last about 5 min by family - troponin checked and found to be negative- likely due to cough  Failure to thrive - lack of oral intake for "months" now- interestingly she ate all of her breakfast yesterday and has been eating rest of meals at least 50%- follow - palliative care consult requested as well-   Parkinson's disease with recurrent falls - PT eval - Sinemet  Hypokalemia and hypomagnesemia - replacee and follow    Essential hypertension - Atenolol, Benazepril    COPD (chronic obstructive pulmonary disease) ?? - Mucinex on med list- no inhaler or nebs noted on home med list - currently not wheezing     Suicidal ideation - paranoid dementia? Cont Effexor, Depakote    Rheumatoid  arthritis - Methotrexate, Mobic   DVT prophylaxis: Lovenox Code Status: DNR Family Communication: husband and daughter Disposition Plan:  SNF tomorrow Consultants:   none Procedures:    Antimicrobials:  Anti-infectives    Start     Dose/Rate Route Frequency Ordered Stop   01/08/17 1730  azithromycin (ZITHROMAX) 500 mg in dextrose 5 % 250 mL IVPB     500 mg 250 mL/hr over 60 Minutes Intravenous Every 24 hours 01/07/17 2033     01/08/17 1700  cefTRIAXone (ROCEPHIN) 1 g in dextrose 5 % 50 mL IVPB     1 g 100 mL/hr over 30 Minutes Intravenous Every 24 hours 01/07/17 2033     01/07/17 1730  cefTRIAXone (ROCEPHIN) 1 g in dextrose 5 % 50 mL IVPB     1 g 100 mL/hr over 30 Minutes Intravenous  Once 01/07/17 1723 01/07/17 1814   01/07/17 1730  azithromycin (ZITHROMAX) tablet 500 mg     500 mg Oral  Once 01/07/17 1723 01/07/17 1745       Objective: Vitals:   01/08/17 1429 01/08/17 1430 01/08/17 2048 01/09/17 0557  BP: 114/61 119/68 127/75 (!) 107/58  Pulse:   80 (!) 58  Resp: 18 18 18 20   Temp:   97.7 F (36.5 C) 97.8 F (36.6 C)  TempSrc:   Oral Oral  SpO2: 100% 100%  100%  Weight:  Intake/Output Summary (Last 24 hours) at 01/09/17 0940 Last data filed at 01/09/17 0400  Gross per 24 hour  Intake          2063.75 ml  Output                0 ml  Net          2063.75 ml   Filed Weights   01/07/17 1039  Weight: 53.5 kg (118 lb)    Examination: General exam: Appears comfortable , confused HEENT: PERRLA, oral mucosa moist, no sclera icterus or thrush- bruising and abrasions noted on face Respiratory system: Clear to auscultation. Respiratory effort normal. Cardiovascular system: S1 & S2 heard, RRR.  No murmurs  Gastrointestinal system: Abdomen soft, non-tender, nondistended. Normal bowel sound. No organomegaly Central nervous system: Alert-  No focal neurological deficits. Extremities: No cyanosis, clubbing or edema Skin: No rashes or ulcers Psychiatry:  Mood  & affect appropriate.     Data Reviewed: I have personally reviewed following labs and imaging studies  CBC:  Recent Labs Lab 01/07/17 1417 01/08/17 0519 01/09/17 0457  WBC 16.1* 11.7* 7.1  HGB 12.3 11.6* 10.6*  HCT 36.8 34.7* 31.5*  MCV 96.1 98.0 98.4  PLT 268 225 [id number]   Basic Metabolic Panel:  Recent Labs Lab 01/07/17 1417 01/08/17 0519 01/09/17 0457  NA 134* 135 137  K 3.8 3.4* 4.2  CL 100* 101 107  CO2 27 26 26   GLUCOSE 105* 99 99  BUN 18 14 14   CREATININE 0.72 0.62 0.65  CALCIUM 8.9 8.5* 8.3*  MG  --  1.6* 1.9  PHOS  --  2.8  --    GFR: Estimated Creatinine Clearance: 51.3 mL/min (by C-G formula based on SCr of 0.65 mg/dL). Liver Function Tests:  Recent Labs Lab 01/08/17 0519  AST 17  ALT <5*  ALKPHOS 45  BILITOT 0.9  PROT 5.9*  ALBUMIN 3.4*   No results for input(s): LIPASE, AMYLASE in the last 168 hours. No results for input(s): AMMONIA in the last 168 hours. Coagulation Profile: No results for input(s): INR, PROTIME in the last 168 hours. Cardiac Enzymes:  Recent Labs Lab 01/07/17 1902 01/07/17 2243 01/08/17 0519 01/08/17 1050  CKTOTAL 59  --   --   --   TROPONINI <0.03 <0.03 <0.03 <0.03   BNP (last 3 results) No results for input(s): PROBNP in the last 8760 hours. HbA1C: No results for input(s): HGBA1C in the last 72 hours. CBG: No results for input(s): GLUCAP in the last 168 hours. Lipid Profile: No results for input(s): CHOL, HDL, LDLCALC, TRIG, CHOLHDL, LDLDIRECT in the last 72 hours. Thyroid Function Tests:  Recent Labs  01/08/17 0519  TSH 0.395   Anemia Panel: No results for input(s): VITAMINB12, FOLATE, FERRITIN, TIBC, IRON, RETICCTPCT in the last 72 hours. Urine analysis:    Component Value Date/Time   COLORURINE STRAW (A) 12/26/2016 1930   APPEARANCEUR CLEAR 12/26/2016 1930   LABSPEC 1.002 (L) 12/26/2016 1930   PHURINE 7.0 12/26/2016 1930   GLUCOSEU NEGATIVE 12/26/2016 1930   GLUCOSEU NEGATIVE 07/08/2016 [place] 12/26/2016 1930   BILIRUBINUR NEGATIVE 12/26/2016 1930   KETONESUR 5 (A) 12/26/2016 1930   PROTEINUR NEGATIVE 12/26/2016 1930   UROBILINOGEN 1.0 07/08/2016 1445   NITRITE NEGATIVE 12/26/2016 1930   LEUKOCYTESUR TRACE (A) 12/26/2016 1930   Sepsis Labs: @LABRCNTIP (procalcitonin:4,lacticidven:4) )No results found for this or any previous visit (from the past 240 hour(s)).       Radiology Studies:  Dg Chest 2 View  Result Date: 01/07/2017 CLINICAL DATA:  Coughing. EXAM: CHEST  2 VIEW COMPARISON:  12/18/2016 FINDINGS: AP and lateral views of the chest show new airspace disease left lung base, suspicious for pneumonia. Right lung appears hyperexpanded but clear. Cardiopericardial silhouette is at upper limits of normal for size. The visualized bony structures of the thorax are intact. Lower thoracic vertebral augmentation again noted. IMPRESSION: New left base airspace disease, suspicious for pneumonia. Electronically Signed   By: Misty Stanley M.D.   On: 01/07/2017 16:12   Ct Head Wo Contrast  Result Date: 01/07/2017 CLINICAL DATA:  Unwitnessed fall.  Nasal deformity. EXAM: CT HEAD WITHOUT CONTRAST CT MAXILLOFACIAL WITHOUT CONTRAST CT CERVICAL SPINE WITHOUT CONTRAST TECHNIQUE: Multidetector CT imaging of the head, cervical spine, and maxillofacial structures were performed using the standard protocol without intravenous contrast. Multiplanar CT image reconstructions of the cervical spine and maxillofacial structures were also generated. COMPARISON:  December 26, 2016 FINDINGS: CT HEAD FINDINGS Brain: No subdural, epidural, or subarachnoid hemorrhage. The ventricles and sulci are normal for age. The cerebellum, brainstem, and basal cisterns are normal. No mass, mass effect, or midline shift. White matter changes identified. No acute cortical ischemia or infarct. Vascular: Atherosclerotic changes in the intracranial carotid arteries. Skull: Nasal bone fractures, described below. No  other bony abnormalities. Other: None. CT MAXILLOFACIAL FINDINGS Osseous: Bilateral nasal bone fractures.  No other fractures. Orbits: Negative. No traumatic or inflammatory finding. Sinuses: Clear. Soft tissues: Negative. CT CERVICAL SPINE FINDINGS Alignment: Kyphosis is seen in the cervical spine centered at C4-5. 2.6 mm of anterolisthesis of C7 versus T1. No other significant malalignment. Skull base and vertebrae: Fragmentation of the C7 right facet is favored to be chronic from previous trauma. No evidence of acute fracture on this study. Soft tissues and spinal canal: No prevertebral fluid or swelling. No visible canal hematoma. Disc levels:  Multilevel degenerative changes. Upper chest: By a focal pleuroparenchymal opacities are most consistent with scarring, right greater than left. No other abnormality seen within the lung apices. Other: No other abnormalities. IMPRESSION: 1. No acute intracranial abnormality. 2. Multiple bilateral nasal bone fractures. 3. No acute fracture or traumatic malalignment in the cervical spine. Minimal anterolisthesis of C7 versus T1 is probably degenerative with no identified fracture. Fragmentation of the right C7 inferior facet has a chronic appearance as well. Electronically Signed   By: Dorise Bullion III M.D   On: 01/07/2017 14:18   Ct Cervical Spine Wo Contrast  Result Date: 01/07/2017 CLINICAL DATA:  Unwitnessed fall.  Nasal deformity. EXAM: CT HEAD WITHOUT CONTRAST CT MAXILLOFACIAL WITHOUT CONTRAST CT CERVICAL SPINE WITHOUT CONTRAST TECHNIQUE: Multidetector CT imaging of the head, cervical spine, and maxillofacial structures were performed using the standard protocol without intravenous contrast. Multiplanar CT image reconstructions of the cervical spine and maxillofacial structures were also generated. COMPARISON:  December 26, 2016 FINDINGS: CT HEAD FINDINGS Brain: No subdural, epidural, or subarachnoid hemorrhage. The ventricles and sulci are normal for age. The  cerebellum, brainstem, and basal cisterns are normal. No mass, mass effect, or midline shift. White matter changes identified. No acute cortical ischemia or infarct. Vascular: Atherosclerotic changes in the intracranial carotid arteries. Skull: Nasal bone fractures, described below. No other bony abnormalities. Other: None. CT MAXILLOFACIAL FINDINGS Osseous: Bilateral nasal bone fractures.  No other fractures. Orbits: Negative. No traumatic or inflammatory finding. Sinuses: Clear. Soft tissues: Negative. CT CERVICAL SPINE FINDINGS Alignment: Kyphosis is seen in the cervical spine centered at C4-5. 2.6 mm of anterolisthesis of  C7 versus T1. No other significant malalignment. Skull base and vertebrae: Fragmentation of the C7 right facet is favored to be chronic from previous trauma. No evidence of acute fracture on this study. Soft tissues and spinal canal: No prevertebral fluid or swelling. No visible canal hematoma. Disc levels:  Multilevel degenerative changes. Upper chest: By a focal pleuroparenchymal opacities are most consistent with scarring, right greater than left. No other abnormality seen within the lung apices. Other: No other abnormalities. IMPRESSION: 1. No acute intracranial abnormality. 2. Multiple bilateral nasal bone fractures. 3. No acute fracture or traumatic malalignment in the cervical spine. Minimal anterolisthesis of C7 versus T1 is probably degenerative with no identified fracture. Fragmentation of the right C7 inferior facet has a chronic appearance as well. Electronically Signed   By: Dorise Bullion III M.D   On: 01/07/2017 14:18   Ct Maxillofacial Wo Cm  Result Date: 01/07/2017 CLINICAL DATA:  Unwitnessed fall.  Nasal deformity. EXAM: CT HEAD WITHOUT CONTRAST CT MAXILLOFACIAL WITHOUT CONTRAST CT CERVICAL SPINE WITHOUT CONTRAST TECHNIQUE: Multidetector CT imaging of the head, cervical spine, and maxillofacial structures were performed using the standard protocol without intravenous  contrast. Multiplanar CT image reconstructions of the cervical spine and maxillofacial structures were also generated. COMPARISON:  December 26, 2016 FINDINGS: CT HEAD FINDINGS Brain: No subdural, epidural, or subarachnoid hemorrhage. The ventricles and sulci are normal for age. The cerebellum, brainstem, and basal cisterns are normal. No mass, mass effect, or midline shift. White matter changes identified. No acute cortical ischemia or infarct. Vascular: Atherosclerotic changes in the intracranial carotid arteries. Skull: Nasal bone fractures, described below. No other bony abnormalities. Other: None. CT MAXILLOFACIAL FINDINGS Osseous: Bilateral nasal bone fractures.  No other fractures. Orbits: Negative. No traumatic or inflammatory finding. Sinuses: Clear. Soft tissues: Negative. CT CERVICAL SPINE FINDINGS Alignment: Kyphosis is seen in the cervical spine centered at C4-5. 2.6 mm of anterolisthesis of C7 versus T1. No other significant malalignment. Skull base and vertebrae: Fragmentation of the C7 right facet is favored to be chronic from previous trauma. No evidence of acute fracture on this study. Soft tissues and spinal canal: No prevertebral fluid or swelling. No visible canal hematoma. Disc levels:  Multilevel degenerative changes. Upper chest: By a focal pleuroparenchymal opacities are most consistent with scarring, right greater than left. No other abnormality seen within the lung apices. Other: No other abnormalities. IMPRESSION: 1. No acute intracranial abnormality. 2. Multiple bilateral nasal bone fractures. 3. No acute fracture or traumatic malalignment in the cervical spine. Minimal anterolisthesis of C7 versus T1 is probably degenerative with no identified fracture. Fragmentation of the right C7 inferior facet has a chronic appearance as well. Electronically Signed   By: Dorise Bullion III M.D   On: 01/07/2017 14:18      Scheduled Meds: . atenolol  50 mg Oral Daily  . azithromycin  500 mg  Intravenous Q24H  . benazepril  40 mg Oral Daily  . carbidopa-levodopa  1.5 tablet Oral TID  . cefTRIAXone (ROCEPHIN)  IV  1 g Intravenous Q24H  . divalproex  125 mg Oral BID  . feeding supplement (ENSURE ENLIVE)  237 mL Oral BID BM  . fesoterodine  4 mg Oral Daily  . folic acid  1 mg Oral Daily  . gabapentin  300 mg Oral TID  . latanoprost  1 drop Both Eyes QHS  . simvastatin  40 mg Oral q1800  . sodium chloride flush  3 mL Intravenous Q12H  . timolol  1 drop Both  Eyes BID  . venlafaxine XR  150 mg Oral Q breakfast   Continuous Infusions: . sodium chloride 75 mL/hr (01/08/17 1533)     LOS: 2 days    Time spent in minutes: [place], MD [facility] Pager: [email or web address] Password [facility] 01/09/2017, 9:40 AM

## 2017-01-09 NOTE — Discharge Summary (Signed)
Physician Discharge Summary  Tara Neal [id number] DOB: 10-16-1941 DOA: 01/07/2017  PCP: [name], MD  Admit date: 01/07/2017 Discharge date: 01/10/2017  Admitted From: home  Disposition:  SNF   Recommendations for Outpatient Follow-up:  1. Palliative care to follow as SNF in case patient declines- this has been dicussed with family in detail and they understand and accept it 2. Follow oral intake and weight closely 3. Stop antibiotics in 3 days  Discharge Condition:  stable   CODE STATUS:  DNR   Diet recommendation:  diabetic Consultations:  none    Discharge Diagnoses:  Principal Problem:   CAP (community acquired pneumonia) Active Problems:   Diabetes ([place])   Essential hypertension   COPD (chronic obstructive pulmonary disease) ([place])   Recurrent falls while walking   Dehydration   Depression   MGUS (monoclonal gammopathy of unknown significance)   Suicidal ideation   Rheumatoid arthritis ([place])   Adult failure to thrive    Subjective: No complaints  Brief Summary: Tara Neal a 76 y.o.femalewith medical history significant of dementia, Parkinson's, HLD, HTN, COPD,   GERD,  , rhuematoid arthritis presents from home with family due to a fall that was unwitnessed. She has had frequent falls. They also noted a cough for 3 days which has been nonproductive and 1 day of loose stools. She also complained of chest pain at home which lasted a few min. Patient has had suicidal ideation was asking family to please kill her. She has endorsed wishes to end her life but no plan. Family is worried that she has been trying to get to the gun or possibly pain medications. Family also reports very poor by mouth intake patient bases stopped eating or drinking. CXR showing LLL infiltrate.    Hospital Course:  Principal Problem:   CAP (community acquired pneumonia) - WBC 16.1, LLL infiltrate on CXR and cough for 3 days prior to admission - WBC count has normalized and she  has no further cough - switch Rocephin to Omnicef and cont Zithromax   Active Problems:  Chest pain at home - reported to last about 5 min by family - troponin checked and found to be negative- likely due to cough  Failure to thrive - lack of oral intake for "months" now- interestingly appetite is better in the hospital - palliative care consult requested as well in case she continues to decline at SNF - DNR  MGUS -   Dr Alvy Bimler was following- Mylema panel came back normal and it was decided that no further work up was needed  Parkinson's disease with recurrent falls - PT eval>> SNF placement - Sinemet  Hypokalemia and hypomagnesemia - replaced and normal now    Essential hypertension - Atenolol, Benazepril    COPD (chronic obstructive pulmonary disease) ?? - Mucinex on med list- no inhaler or nebs noted on home med list - currently not wheezing     Suicidal ideation- dementia - paranoid dementia? No further such complaints in the hospital - Cont Effexor, Depakote    Rheumatoid arthritis - Methotrexate, Mobic- Protonix being given by PCP for GI protection- change to Pepcid to reduce chance of c diff infection  Discharge Instructions  Discharge Instructions    Diet - low sodium heart healthy    Complete by:  As directed    Increase activity slowly    Complete by:  As directed      Allergies as of 01/10/2017      Reactions   Codeine  Nausea Only   Can take low doses   Sulfonamide Derivatives Nausea Only   REACTION: Nausea   Ambien [zolpidem Tartrate] Anxiety   Penicillins Rash   .Marland KitchenHas patient had a PCN reaction causing immediate rash, facial/tongue/throat swelling, SOB or lightheadedness with hypotension: No Has patient had a PCN reaction causing severe rash involving mucus membranes or skin necrosis: No Has patient had a PCN reaction that required hospitalization No Has patient had a PCN reaction occurring within the last 10 years: No If all of the  above answers are "NO", then may proceed with Cephalosporin use.   Percocet [oxycodone-acetaminophen] Itching      Medication List    STOP taking these medications   atenolol 50 MG tablet Commonly known as:  TENORMIN   HYDROcodone-acetaminophen 5-325 MG tablet Commonly known as:  NORCO/VICODIN   omeprazole 20 MG capsule Commonly known as:  PRILOSEC     TAKE these medications   acetaminophen 325 MG tablet Commonly known as:  TYLENOL Take 2 tablets (650 mg total) by mouth every 6 (six) hours as needed for mild pain (or Fever >/= 101).   alendronate 70 MG tablet Commonly known as:  FOSAMAX Take 70 mg by mouth once a week.   azithromycin 250 MG tablet Commonly known as:  ZITHROMAX Take 1 tablet (250 mg total) by mouth daily.   benazepril 40 MG tablet Commonly known as:  LOTENSIN TAKE 1 TABLET EVERY DAY   calcium carbonate 600 MG Tabs tablet Commonly known as:  OS-CAL Take 600 mg by mouth 2 (two) times daily with a meal.   carbidopa-levodopa 25-100 MG tablet Commonly known as:  SINEMET IR Take 1 tablet by mouth 3 (three) times daily. What changed:  how much to take   cefdinir 300 MG capsule Commonly known as:  OMNICEF Take 1 capsule (300 mg total) by mouth 2 (two) times daily.   cetirizine 10 MG tablet Commonly known as:  ZYRTEC Take 10 mg by mouth daily.   denosumab 60 MG/ML Soln injection Commonly known as:  PROLIA Inject 60 mg into the skin every 6 (six) months. Administer in upper arm, thigh, or abdomen   divalproex 125 MG DR tablet Commonly known as:  DEPAKOTE Take 125 mg by mouth 2 (two) times daily.   famotidine 20 MG tablet Commonly known as:  PEPCID Take 1 tablet (20 mg total) by mouth 2 (two) times daily.   feeding supplement (ENSURE ENLIVE) Liqd Take 237 mLs by mouth 2 (two) times daily between meals.   fesoterodine 4 MG Tb24 tablet Commonly known as:  TOVIAZ Take 1 tablet (4 mg total) by mouth daily.   Fish Oil 1200 MG Caps Take 2 capsules  by mouth daily.   folic acid 1 MG tablet Commonly known as:  FOLVITE Take 1 mg by mouth daily.   gabapentin 300 MG capsule Commonly known as:  NEURONTIN Take 300 mg by mouth 3 (three) times daily.   guaiFENesin 600 MG 12 hr tablet Commonly known as:  MUCINEX Take by mouth 2 (two) times daily as needed.   latanoprost 0.005 % ophthalmic solution Commonly known as:  XALATAN Place 1 drop into both eyes at bedtime.   meloxicam 7.5 MG tablet Commonly known as:  MOBIC Take 15 mg by mouth daily.   methotrexate 250 MG/10ML injection Inject 1 mL (25 mg total) into the skin once a week.   multivitamin tablet Take 1 tablet by mouth daily.   polyethylene glycol packet Commonly known as:  MIRALAX / GLYCOLAX Take 17 g by mouth daily as needed.   SENNA S PO Take 1 tablet by mouth daily.   simvastatin 40 MG tablet Commonly known as:  ZOCOR TAKE 1 TABLET EVERY DAY  AT  6PM   timolol 0.5 % ophthalmic solution Commonly known as:  TIMOPTIC Place 1 drop into both eyes 2 (two) times daily.   TURMERIC PO Take 1 capsule by mouth daily.   venlafaxine XR 150 MG 24 hr capsule Commonly known as:  EFFEXOR-XR TAKE 1 CAPSULE EVERY DAY WITH BREAKFAST   vitamin C 1000 MG tablet Take 1,000 mg by mouth daily.   Vitamin D3 1000 units Caps Take 1 capsule by mouth daily.   VOLTAREN 1 % Gel Generic drug:  diclofenac sodium Apply 1 application topically daily as needed.       Allergies  Allergen Reactions  . Codeine Nausea Only    Can take low doses  . Sulfonamide Derivatives Nausea Only    REACTION: Nausea  . Ambien [Zolpidem Tartrate] Anxiety  . Penicillins Rash    .Marland KitchenHas patient had a PCN reaction causing immediate rash, facial/tongue/throat swelling, SOB or lightheadedness with hypotension: No Has patient had a PCN reaction causing severe rash involving mucus membranes or skin necrosis: No Has patient had a PCN reaction that required hospitalization No Has patient had a PCN  reaction occurring within the last 10 years: No If all of the above answers are "NO", then may proceed with Cephalosporin use.   Marland Kitchen Percocet [Oxycodone-Acetaminophen] Itching     Procedures/Studies: none  Dg Chest 2 View  Result Date: 01/07/2017 CLINICAL DATA:  Coughing. EXAM: CHEST  2 VIEW COMPARISON:  12/18/2016 FINDINGS: AP and lateral views of the chest show new airspace disease left lung base, suspicious for pneumonia. Right lung appears hyperexpanded but clear. Cardiopericardial silhouette is at upper limits of normal for size. The visualized bony structures of the thorax are intact. Lower thoracic vertebral augmentation again noted. IMPRESSION: New left base airspace disease, suspicious for pneumonia. Electronically Signed   By: Misty Stanley M.D.   On: 01/07/2017 16:12   Dg Chest 2 View  Result Date: 12/26/2016 CLINICAL DATA:  Worsening right-sided weakness. Worsening back pain. EXAM: CHEST  2 VIEW COMPARISON:  Chest radiographs 09/29/2016. Performed concurrently with dedicated thoracic spine radiographs. FINDINGS: Unchanged heart size and mediastinal contours with tortuosity and atherosclerosis of the thoracic aorta. No pulmonary edema or focal airspace disease. No pleural fluid or pneumothorax. There is eventration of the right hemidiaphragm containing air filled colon. Kyphoplasty within T11 and T8 compression fractures. IMPRESSION: Unchanged appearance of the chest. No acute pulmonary process. Stable aortic tortuosity and atherosclerosis. Electronically Signed   By: Jeb Levering M.D.   On: 12/26/2016 21:44   Dg Thoracic Spine 2 View  Result Date: 12/26/2016 CLINICAL DATA:  Worsening back pain. EXAM: THORACIC SPINE 2 VIEWS COMPARISON:  Thoracic spine CT and MRI 09/29/2016 FINDINGS: Kyphoplasty within T10 and T11 compression fractures since prior exam. No evidence of new compression fracture. Upper thoracic spine not well visualized due to overlapping osseous and soft tissue  structures. Degenerative disc disease most prominent in the lower thoracic spine. Degenerative change in scoliosis of the visualized lumbar spine is partially included. IMPRESSION: 1. Kyphoplasty within T10 and T11 compression fractures, new since most recent prior imaging. 2. No acute abnormality in the thoracic spine is seen radiographically. Electronically Signed   By: Jeb Levering M.D.   On: 12/26/2016 21:47   Ct Head Wo  Contrast  Result Date: 01/07/2017 CLINICAL DATA:  Unwitnessed fall.  Nasal deformity. EXAM: CT HEAD WITHOUT CONTRAST CT MAXILLOFACIAL WITHOUT CONTRAST CT CERVICAL SPINE WITHOUT CONTRAST TECHNIQUE: Multidetector CT imaging of the head, cervical spine, and maxillofacial structures were performed using the standard protocol without intravenous contrast. Multiplanar CT image reconstructions of the cervical spine and maxillofacial structures were also generated. COMPARISON:  December 26, 2016 FINDINGS: CT HEAD FINDINGS Brain: No subdural, epidural, or subarachnoid hemorrhage. The ventricles and sulci are normal for age. The cerebellum, brainstem, and basal cisterns are normal. No mass, mass effect, or midline shift. White matter changes identified. No acute cortical ischemia or infarct. Vascular: Atherosclerotic changes in the intracranial carotid arteries. Skull: Nasal bone fractures, described below. No other bony abnormalities. Other: None. CT MAXILLOFACIAL FINDINGS Osseous: Bilateral nasal bone fractures.  No other fractures. Orbits: Negative. No traumatic or inflammatory finding. Sinuses: Clear. Soft tissues: Negative. CT CERVICAL SPINE FINDINGS Alignment: Kyphosis is seen in the cervical spine centered at C4-5. 2.6 mm of anterolisthesis of C7 versus T1. No other significant malalignment. Skull base and vertebrae: Fragmentation of the C7 right facet is favored to be chronic from previous trauma. No evidence of acute fracture on this study. Soft tissues and spinal canal: No prevertebral  fluid or swelling. No visible canal hematoma. Disc levels:  Multilevel degenerative changes. Upper chest: By a focal pleuroparenchymal opacities are most consistent with scarring, right greater than left. No other abnormality seen within the lung apices. Other: No other abnormalities. IMPRESSION: 1. No acute intracranial abnormality. 2. Multiple bilateral nasal bone fractures. 3. No acute fracture or traumatic malalignment in the cervical spine. Minimal anterolisthesis of C7 versus T1 is probably degenerative with no identified fracture. Fragmentation of the right C7 inferior facet has a chronic appearance as well. Electronically Signed   By: Dorise Bullion III M.D   On: 01/07/2017 14:18   Ct Head Wo Contrast  Result Date: 12/26/2016 CLINICAL DATA:  Altered mental status with urinary frequency. Difficulty ambulating. EXAM: CT HEAD WITHOUT CONTRAST TECHNIQUE: Contiguous axial images were obtained from the base of the skull through the vertex without intravenous contrast. COMPARISON:  MRI brain 08/18/2016 FINDINGS: Brain: Ventricles, cisterns and other CSF spaces are within normal. There is no mass, mass effect, shift of midline structures or acute hemorrhage. No evidence of acute infarction. There is evidence of chronic ischemic microvascular disease. Vascular: Mild calcified plaque over the cavernous segment of the internal carotid arteries. Skull: Within normal Sinuses/Orbits: Within normal. Other: None. IMPRESSION: No acute intracranial findings. Chronic ischemic microvascular disease. Electronically Signed   By: Marin Olp M.D.   On: 12/26/2016 21:33   Ct Cervical Spine Wo Contrast  Result Date: 01/07/2017 CLINICAL DATA:  Unwitnessed fall.  Nasal deformity. EXAM: CT HEAD WITHOUT CONTRAST CT MAXILLOFACIAL WITHOUT CONTRAST CT CERVICAL SPINE WITHOUT CONTRAST TECHNIQUE: Multidetector CT imaging of the head, cervical spine, and maxillofacial structures were performed using the standard protocol without  intravenous contrast. Multiplanar CT image reconstructions of the cervical spine and maxillofacial structures were also generated. COMPARISON:  December 26, 2016 FINDINGS: CT HEAD FINDINGS Brain: No subdural, epidural, or subarachnoid hemorrhage. The ventricles and sulci are normal for age. The cerebellum, brainstem, and basal cisterns are normal. No mass, mass effect, or midline shift. White matter changes identified. No acute cortical ischemia or infarct. Vascular: Atherosclerotic changes in the intracranial carotid arteries. Skull: Nasal bone fractures, described below. No other bony abnormalities. Other: None. CT MAXILLOFACIAL FINDINGS Osseous: Bilateral nasal bone fractures.  No other  fractures. Orbits: Negative. No traumatic or inflammatory finding. Sinuses: Clear. Soft tissues: Negative. CT CERVICAL SPINE FINDINGS Alignment: Kyphosis is seen in the cervical spine centered at C4-5. 2.6 mm of anterolisthesis of C7 versus T1. No other significant malalignment. Skull base and vertebrae: Fragmentation of the C7 right facet is favored to be chronic from previous trauma. No evidence of acute fracture on this study. Soft tissues and spinal canal: No prevertebral fluid or swelling. No visible canal hematoma. Disc levels:  Multilevel degenerative changes. Upper chest: By a focal pleuroparenchymal opacities are most consistent with scarring, right greater than left. No other abnormality seen within the lung apices. Other: No other abnormalities. IMPRESSION: 1. No acute intracranial abnormality. 2. Multiple bilateral nasal bone fractures. 3. No acute fracture or traumatic malalignment in the cervical spine. Minimal anterolisthesis of C7 versus T1 is probably degenerative with no identified fracture. Fragmentation of the right C7 inferior facet has a chronic appearance as well. Electronically Signed   By: Dorise Bullion III M.D   On: 01/07/2017 14:18   Ct Maxillofacial Wo Cm  Result Date: 01/07/2017 CLINICAL DATA:   Unwitnessed fall.  Nasal deformity. EXAM: CT HEAD WITHOUT CONTRAST CT MAXILLOFACIAL WITHOUT CONTRAST CT CERVICAL SPINE WITHOUT CONTRAST TECHNIQUE: Multidetector CT imaging of the head, cervical spine, and maxillofacial structures were performed using the standard protocol without intravenous contrast. Multiplanar CT image reconstructions of the cervical spine and maxillofacial structures were also generated. COMPARISON:  December 26, 2016 FINDINGS: CT HEAD FINDINGS Brain: No subdural, epidural, or subarachnoid hemorrhage. The ventricles and sulci are normal for age. The cerebellum, brainstem, and basal cisterns are normal. No mass, mass effect, or midline shift. White matter changes identified. No acute cortical ischemia or infarct. Vascular: Atherosclerotic changes in the intracranial carotid arteries. Skull: Nasal bone fractures, described below. No other bony abnormalities. Other: None. CT MAXILLOFACIAL FINDINGS Osseous: Bilateral nasal bone fractures.  No other fractures. Orbits: Negative. No traumatic or inflammatory finding. Sinuses: Clear. Soft tissues: Negative. CT CERVICAL SPINE FINDINGS Alignment: Kyphosis is seen in the cervical spine centered at C4-5. 2.6 mm of anterolisthesis of C7 versus T1. No other significant malalignment. Skull base and vertebrae: Fragmentation of the C7 right facet is favored to be chronic from previous trauma. No evidence of acute fracture on this study. Soft tissues and spinal canal: No prevertebral fluid or swelling. No visible canal hematoma. Disc levels:  Multilevel degenerative changes. Upper chest: By a focal pleuroparenchymal opacities are most consistent with scarring, right greater than left. No other abnormality seen within the lung apices. Other: No other abnormalities. IMPRESSION: 1. No acute intracranial abnormality. 2. Multiple bilateral nasal bone fractures. 3. No acute fracture or traumatic malalignment in the cervical spine. Minimal anterolisthesis of C7 versus T1  is probably degenerative with no identified fracture. Fragmentation of the right C7 inferior facet has a chronic appearance as well. Electronically Signed   By: Dorise Bullion III M.D   On: 01/07/2017 14:18       Discharge Exam: Vitals:   01/09/17 2130 01/10/17 0627  BP: (!) 142/62 130/75  Pulse: 74 69  Resp: 18 18  Temp: 98.6 F (37 C) 98.8 F (37.1 C)   Vitals:   01/09/17 1038 01/09/17 1435 01/09/17 2130 01/10/17 0627  BP:  125/61 (!) 142/62 130/75  Pulse:  62 74 69  Resp:  18 18 18   Temp:  98.3 F (36.8 C) 98.6 F (37 C) 98.8 F (37.1 C)  TempSrc:  Oral Oral Axillary  SpO2:  100%  100% 98%  Weight:      Height: 5\' 4"  (1.626 m)       General: Pt is alert, awake, not in acute distress Cardiovascular: RRR, S1/S2 +, no rubs, no gallops Respiratory: CTA bilaterally, no wheezing, no rhonchi Abdominal: Soft, NT, ND, bowel sounds + Extremities: no edema, no cyanosis    The results of significant diagnostics from this hospitalization (including imaging, microbiology, ancillary and laboratory) are listed below for reference.     Microbiology: Recent Results (from the past 240 hour(s))  Culture, blood (routine x 2) Call MD if unable to obtain prior to antibiotics being given     Status: None (Preliminary result)   Collection Time: 01/07/17  9:10 PM  Result Value Ref Range Status   Specimen Description BLOOD BLOOD LEFT HAND  Final   Special Requests IN PEDIATRIC BOTTLE 4 CC  Final   Culture   Final    NO GROWTH 1 DAY Performed at [facility], [address], [place], [place] [address]    Report Status PENDING  Incomplete  Culture, blood (routine x 2) Call MD if unable to obtain prior to antibiotics being given     Status: None (Preliminary result)   Collection Time: 01/07/17  9:17 PM  Result Value Ref Range Status   Specimen Description BLOOD BLOOD RIGHT ARM  Final   Special Requests IN PEDIATRIC BOTTLE 1.5 CC  Final   Culture   Final    NO GROWTH 1  DAY Performed at [facility], [place] [address], [place], [place] [address]    Report Status PENDING  Incomplete     Labs: BNP (last 3 results) No results for input(s): BNP in the last 8760 hours. Basic Metabolic Panel:  Recent Labs Lab 01/07/17 1417 01/08/17 0519 01/09/17 0457  NA 134* 135 137  K 3.8 3.4* 4.2  CL 100* 101 107  CO2 27 26 26   GLUCOSE 105* 99 99  BUN 18 14 14   CREATININE 0.72 0.62 0.65  CALCIUM 8.9 8.5* 8.3*  MG  --  1.6* 1.9  PHOS  --  2.8  --    Liver Function Tests:  Recent Labs Lab 01/08/17 0519  AST 17  ALT <5*  ALKPHOS 45  BILITOT 0.9  PROT 5.9*  ALBUMIN 3.4*   No results for input(s): LIPASE, AMYLASE in the last 168 hours. No results for input(s): AMMONIA in the last 168 hours. CBC:  Recent Labs Lab 01/07/17 1417 01/08/17 0519 01/09/17 0457  WBC 16.1* 11.7* 7.1  HGB 12.3 11.6* 10.6*  HCT 36.8 34.7* 31.5*  MCV 96.1 98.0 98.4  PLT 268 225 220   Cardiac Enzymes:  Recent Labs Lab 01/07/17 1902 01/07/17 2243 01/08/17 0519 01/08/17 1050  CKTOTAL 59  --   --   --   TROPONINI <0.03 <0.03 <0.03 <0.03   BNP: Invalid input(s): POCBNP CBG: No results for input(s): GLUCAP in the last 168 hours. D-Dimer No results for input(s): DDIMER in the last 72 hours. Hgb A1c  Recent Labs  01/08/17 0519  HGBA1C 5.1   Lipid Profile No results for input(s): CHOL, HDL, LDLCALC, TRIG, CHOLHDL, LDLDIRECT in the last 72 hours. Thyroid function studies  Recent Labs  01/08/17 0519  TSH 0.395   Anemia work up No results for input(s): VITAMINB12, FOLATE, FERRITIN, TIBC, IRON, RETICCTPCT in the last 72 hours. Urinalysis    Component Value Date/Time   COLORURINE YELLOW 01/09/2017 [place] 01/09/2017 1553  LABSPEC 1.011 01/09/2017 1553   PHURINE 6.0 01/09/2017 1553   GLUCOSEU NEGATIVE 01/09/2017 1553   GLUCOSEU NEGATIVE 07/08/2016 1445   HGBUR NEGATIVE 01/09/2017 1553   BILIRUBINUR NEGATIVE 01/09/2017 1553    KETONESUR 5 (A) 01/09/2017 1553   PROTEINUR NEGATIVE 01/09/2017 1553   UROBILINOGEN 1.0 07/08/2016 1445   NITRITE NEGATIVE 01/09/2017 1553   LEUKOCYTESUR NEGATIVE 01/09/2017 1553   Sepsis Labs Invalid input(s): PROCALCITONIN,  WBC,  LACTICIDVEN Microbiology Recent Results (from the past 240 hour(s))  Culture, blood (routine x 2) Call MD if unable to obtain prior to antibiotics being given     Status: None (Preliminary result)   Collection Time: 01/07/17  9:10 PM  Result Value Ref Range Status   Specimen Description BLOOD BLOOD LEFT HAND  Final   Special Requests IN PEDIATRIC BOTTLE 4 CC  Final   Culture   Final    NO GROWTH 1 DAY Performed at [facility], [place] [address], [place], [place] [address]    Report Status PENDING  Incomplete  Culture, blood (routine x 2) Call MD if unable to obtain prior to antibiotics being given     Status: None (Preliminary result)   Collection Time: 01/07/17  9:17 PM  Result Value Ref Range Status   Specimen Description BLOOD BLOOD RIGHT ARM  Final   Special Requests IN PEDIATRIC BOTTLE 1.5 CC  Final   Culture   Final    NO GROWTH 1 DAY Performed at [facility], [place] [address], [place], [place] [address]    Report Status PENDING  Incomplete     Time coordinating discharge: Over 30 minutes  SIGNED:   Debbe Odea, MD  [facility] 01/10/2017, 9:41 AM Pager   If 7PM-7AM, please contact night-coverage [email or web address] Password [email or web address]

## 2017-01-09 NOTE — Progress Notes (Signed)
Initial Nutrition Assessment  DOCUMENTATION CODES:   Non-severe (moderate) malnutrition in context of chronic illness  INTERVENTION:   Continue Ensure Enlive po BID, each supplement provides 350 kcal and 20 grams of protein Recommend staff continue to provide help with meal set-up. RD to continue to monitor  NUTRITION DIAGNOSIS:   Malnutrition related to chronic illness as evidenced by percent weight loss, moderate depletion of body fat, moderate depletions of muscle mass.  GOAL:   Patient will meet greater than or equal to 90% of their needs  MONITOR:   PO intake, Supplement acceptance, Labs, Weight trends, I & O's  REASON FOR ASSESSMENT:   Consult Assessment of nutrition requirement/status  ASSESSMENT:   76 y.o. female with medical history significant of dementia, Parkinson's, HLD, HTN,   COPD,   GERD,  , rhuematoid arthritis presents from home with family due to a fall that was unwitnessed. They also noted a cough for 3 days which has been nonproductive and 1 day of loose stools. She also complained of chest pain at home which lasted a few min. Patient has had suicidal ideation asking family to please kill her. She has endorsed wishes to end her life but no plan.  Family is worried that she has been trying to get to the gun or possibly pain medications. Family also reports very poor by mouth intake patient bases stopped eating or drinking.   Patient in room with nurse tech assisting with meal set-up. No family at bedside. Per family report in H&P, pt was not eating or drinking PTA. Pt now is eating 50-100% of meals. Pt was eating some french toast and sausage links this morning for breakfast. Pt reports good appetite, no issues with chewing or swallowing.   Per chart review, pt has lost 12 lb since 11/1 (9% wt loss x 3 months, significant for time frame). Nutrition-Focused physical exam completed. Findings are moderate fat depletion, moderate muscle depletion, and no edema.    Medications: Folic acid tablet daily Labs reviewed: Mg/Phos WNL  Diet Order:  Diet Heart Room service appropriate? Yes; Fluid consistency: Thin  Skin:  Reviewed, no issues  Last BM:  2/10  Height:   Ht Readings from Last 1 Encounters:  01/09/17 5\' 4"  (1.626 m)    Weight:   Wt Readings from Last 1 Encounters:  01/07/17 118 lb (53.5 kg)    Ideal Body Weight:  54.5 kg  BMI:  Body mass index is 20.25 kg/m.  Estimated Nutritional Needs:   Kcal:  1300-1500  Protein:  60-70g  Fluid:  1.5L/day  EDUCATION NEEDS:   No education needs identified at this time  Clayton Bibles, MS, RD, LDN Pager: [phone] After Hours Pager: [phone]

## 2017-01-09 NOTE — Progress Notes (Signed)
PHARMACIST - PHYSICIAN COMMUNICATION DR:   Wynelle Cleveland CONCERNING: Antibiotic IV to Oral Route Change Policy  RECOMMENDATION: This patient is receiving Azithromycin by the intravenous route.  Based on criteria approved by the Pharmacy and Therapeutics Committee, the antibiotic(s) is/are being converted to the equivalent oral dose form(s).   DESCRIPTION: These criteria include:  Patient being treated for a respiratory tract infection, urinary tract infection, cellulitis or clostridium difficile associated diarrhea if on metronidazole  The patient is not neutropenic and does not exhibit a GI malabsorption state  The patient is eating (either orally or via tube) and/or has been taking other orally administered medications for a least 24 hours  The patient is improving clinically and has a Tmax < 100.5  If you have questions about this conversion, please contact the Pharmacy Department  [phone] )  [place] PharmD, [place] Pager [phone] 01/09/2017 12:55 PM

## 2017-01-10 ENCOUNTER — Telehealth: Payer: Self-pay | Admitting: *Deleted

## 2017-01-10 ENCOUNTER — Other Ambulatory Visit: Payer: Self-pay

## 2017-01-10 DIAGNOSIS — R41841 Cognitive communication deficit: Secondary | ICD-10-CM | POA: Diagnosis not present

## 2017-01-10 DIAGNOSIS — S0101XA Laceration without foreign body of scalp, initial encounter: Secondary | ICD-10-CM | POA: Diagnosis not present

## 2017-01-10 DIAGNOSIS — Z833 Family history of diabetes mellitus: Secondary | ICD-10-CM | POA: Diagnosis not present

## 2017-01-10 DIAGNOSIS — R627 Adult failure to thrive: Secondary | ICD-10-CM | POA: Diagnosis present

## 2017-01-10 DIAGNOSIS — F419 Anxiety disorder, unspecified: Secondary | ICD-10-CM | POA: Diagnosis present

## 2017-01-10 DIAGNOSIS — R2681 Unsteadiness on feet: Secondary | ICD-10-CM | POA: Diagnosis not present

## 2017-01-10 DIAGNOSIS — S13120A Subluxation of C1/C2 cervical vertebrae, initial encounter: Secondary | ICD-10-CM | POA: Diagnosis not present

## 2017-01-10 DIAGNOSIS — R1312 Dysphagia, oropharyngeal phase: Secondary | ICD-10-CM | POA: Diagnosis not present

## 2017-01-10 DIAGNOSIS — M79632 Pain in left forearm: Secondary | ICD-10-CM | POA: Diagnosis not present

## 2017-01-10 DIAGNOSIS — Z66 Do not resuscitate: Secondary | ICD-10-CM | POA: Diagnosis present

## 2017-01-10 DIAGNOSIS — S12000A Unspecified displaced fracture of first cervical vertebra, initial encounter for closed fracture: Secondary | ICD-10-CM | POA: Diagnosis not present

## 2017-01-10 DIAGNOSIS — F0390 Unspecified dementia without behavioral disturbance: Secondary | ICD-10-CM | POA: Diagnosis not present

## 2017-01-10 DIAGNOSIS — G309 Alzheimer's disease, unspecified: Secondary | ICD-10-CM | POA: Diagnosis present

## 2017-01-10 DIAGNOSIS — Z515 Encounter for palliative care: Secondary | ICD-10-CM | POA: Diagnosis present

## 2017-01-10 DIAGNOSIS — M6281 Muscle weakness (generalized): Secondary | ICD-10-CM | POA: Diagnosis not present

## 2017-01-10 DIAGNOSIS — Z8249 Family history of ischemic heart disease and other diseases of the circulatory system: Secondary | ICD-10-CM | POA: Diagnosis not present

## 2017-01-10 DIAGNOSIS — F329 Major depressive disorder, single episode, unspecified: Secondary | ICD-10-CM

## 2017-01-10 DIAGNOSIS — S59901A Unspecified injury of right elbow, initial encounter: Secondary | ICD-10-CM | POA: Diagnosis not present

## 2017-01-10 DIAGNOSIS — W19XXXD Unspecified fall, subsequent encounter: Secondary | ICD-10-CM | POA: Diagnosis not present

## 2017-01-10 DIAGNOSIS — R296 Repeated falls: Secondary | ICD-10-CM | POA: Diagnosis not present

## 2017-01-10 DIAGNOSIS — G20 Parkinson's disease: Secondary | ICD-10-CM | POA: Diagnosis not present

## 2017-01-10 DIAGNOSIS — J189 Pneumonia, unspecified organism: Secondary | ICD-10-CM

## 2017-01-10 DIAGNOSIS — I10 Essential (primary) hypertension: Secondary | ICD-10-CM | POA: Diagnosis present

## 2017-01-10 DIAGNOSIS — S12111A Posterior displaced Type II dens fracture, initial encounter for closed fracture: Secondary | ICD-10-CM | POA: Diagnosis not present

## 2017-01-10 DIAGNOSIS — F0280 Dementia in other diseases classified elsewhere without behavioral disturbance: Secondary | ICD-10-CM | POA: Diagnosis present

## 2017-01-10 DIAGNOSIS — W19XXXA Unspecified fall, initial encounter: Secondary | ICD-10-CM | POA: Diagnosis not present

## 2017-01-10 DIAGNOSIS — R630 Anorexia: Secondary | ICD-10-CM | POA: Diagnosis not present

## 2017-01-10 DIAGNOSIS — R531 Weakness: Secondary | ICD-10-CM | POA: Diagnosis not present

## 2017-01-10 DIAGNOSIS — M069 Rheumatoid arthritis, unspecified: Secondary | ICD-10-CM | POA: Diagnosis not present

## 2017-01-10 DIAGNOSIS — H409 Unspecified glaucoma: Secondary | ICD-10-CM | POA: Diagnosis present

## 2017-01-10 DIAGNOSIS — Z7189 Other specified counseling: Secondary | ICD-10-CM

## 2017-01-10 DIAGNOSIS — S59912A Unspecified injury of left forearm, initial encounter: Secondary | ICD-10-CM | POA: Diagnosis not present

## 2017-01-10 DIAGNOSIS — M810 Age-related osteoporosis without current pathological fracture: Secondary | ICD-10-CM | POA: Diagnosis present

## 2017-01-10 DIAGNOSIS — M25521 Pain in right elbow: Secondary | ICD-10-CM | POA: Diagnosis not present

## 2017-01-10 DIAGNOSIS — R278 Other lack of coordination: Secondary | ICD-10-CM | POA: Diagnosis not present

## 2017-01-10 DIAGNOSIS — R269 Unspecified abnormalities of gait and mobility: Secondary | ICD-10-CM | POA: Diagnosis not present

## 2017-01-10 DIAGNOSIS — E785 Hyperlipidemia, unspecified: Secondary | ICD-10-CM | POA: Diagnosis not present

## 2017-01-10 DIAGNOSIS — S0190XA Unspecified open wound of unspecified part of head, initial encounter: Secondary | ICD-10-CM | POA: Diagnosis not present

## 2017-01-10 DIAGNOSIS — R2689 Other abnormalities of gait and mobility: Secondary | ICD-10-CM | POA: Diagnosis not present

## 2017-01-10 DIAGNOSIS — S299XXA Unspecified injury of thorax, initial encounter: Secondary | ICD-10-CM | POA: Diagnosis not present

## 2017-01-10 DIAGNOSIS — S12110A Anterior displaced Type II dens fracture, initial encounter for closed fracture: Secondary | ICD-10-CM | POA: Diagnosis present

## 2017-01-10 DIAGNOSIS — Z79890 Hormone replacement therapy (postmenopausal): Secondary | ICD-10-CM | POA: Diagnosis not present

## 2017-01-10 DIAGNOSIS — S70212A Abrasion, left hip, initial encounter: Secondary | ICD-10-CM | POA: Diagnosis not present

## 2017-01-10 DIAGNOSIS — S6991XA Unspecified injury of right wrist, hand and finger(s), initial encounter: Secondary | ICD-10-CM | POA: Diagnosis not present

## 2017-01-10 DIAGNOSIS — Z87891 Personal history of nicotine dependence: Secondary | ICD-10-CM | POA: Diagnosis not present

## 2017-01-10 DIAGNOSIS — S0003XA Contusion of scalp, initial encounter: Secondary | ICD-10-CM | POA: Diagnosis not present

## 2017-01-10 DIAGNOSIS — S199XXA Unspecified injury of neck, initial encounter: Secondary | ICD-10-CM | POA: Diagnosis not present

## 2017-01-10 DIAGNOSIS — W1830XA Fall on same level, unspecified, initial encounter: Secondary | ICD-10-CM | POA: Diagnosis present

## 2017-01-10 DIAGNOSIS — J449 Chronic obstructive pulmonary disease, unspecified: Secondary | ICD-10-CM | POA: Diagnosis not present

## 2017-01-10 DIAGNOSIS — S0990XA Unspecified injury of head, initial encounter: Secondary | ICD-10-CM | POA: Diagnosis not present

## 2017-01-10 DIAGNOSIS — M542 Cervicalgia: Secondary | ICD-10-CM | POA: Diagnosis not present

## 2017-01-10 DIAGNOSIS — K219 Gastro-esophageal reflux disease without esophagitis: Secondary | ICD-10-CM | POA: Diagnosis present

## 2017-01-10 DIAGNOSIS — J181 Lobar pneumonia, unspecified organism: Secondary | ICD-10-CM | POA: Diagnosis not present

## 2017-01-10 MED ORDER — CEFDINIR 300 MG PO CAPS: 300.0000 mg | ORAL_CAPSULE | Freq: Two times a day (BID) | ORAL | Status: DC

## 2017-01-10 MED ORDER — ACETAMINOPHEN 325 MG PO TABS: 650.0000 mg | ORAL_TABLET | Freq: Four times a day (QID) | ORAL | Status: AC | PRN

## 2017-01-10 MED ORDER — LORAZEPAM 1 MG PO TABS
1.0000 mg | ORAL_TABLET | Freq: Once | ORAL | Status: AC
  Administered 2017-01-10: 1 mg via ORAL
  Filled 2017-01-10: qty 1

## 2017-01-10 MED ORDER — AZITHROMYCIN 250 MG PO TABS: 250.0000 mg | ORAL_TABLET | Freq: Every day | ORAL | 0 refills | Status: DC

## 2017-01-10 MED ORDER — FAMOTIDINE 20 MG PO TABS: 20.0000 mg | ORAL_TABLET | Freq: Two times a day (BID) | ORAL | Status: AC

## 2017-01-10 MED ORDER — ENSURE ENLIVE PO LIQD: 237.0000 mL | Freq: Two times a day (BID) | ORAL | 12 refills | Status: AC

## 2017-01-10 NOTE — Consult Note (Signed)
Consultation Note Date: 01/10/2017   Patient Name: Tara Neal  DOB: 03-06-1941  MRN: [id number]  Age / Sex: 76 y.o., female  PCP: Biagio Borg, MD Referring Physician: Debbe Odea, MD  Reason for Consultation: Establishing goals of care  HPI/Patient Profile: 76 y.o. female   admitted on 01/07/2017  Clinical Assessment and Goals of Care:    Tara Neal is a 76 y.o. female with medical history significant of dementia, Parkinson's, HLD, HTN,   COPD,   GERD,  , rhuematoid arthritis presented from home with family due to a fall, was admitted for PNA, failure to thrive, lack of adequate oral intake, unintentional weight loss.   Palliative care has been consulted for goals of care discussions.   The patient is resting in a chair, she states that she would like to go home before she can go over to [facility]. Husband and daughter are at the bedside. I introduced myself and palliative care as follows; Palliative medicine is specialized medical care for people living with serious illness. It focuses on providing relief from the symptoms and stress of a serious illness. The goal is to improve quality of life for both the patient and the family.  The  Patient has had weight loss, functional decline and falls for the past several months. Her husband has been providint her with 24/7 care, he has chronic medical conditions and has to undergo surgery soon. She has had recurrent hospitalizations.   We discussed dementia trajectory in detail, all questions answered to the best of my ability. We discussed about long term care, risk for falls, recurrent infections, declining oral intake. Prognosis could be as short as the next 12 months.   Hospice services also discussed, care that can be provided at residential hospice and how it differs from long term care also discussed.          See recommendations below, thank you for the  consult.   HCPOA  husband who was her primary caregiver 24/7 for the past few years as she has declined from dementia Has a son and daughter who live nearby.   SUMMARY OF RECOMMENDATIONS    DNR [facility] SNF rehab with palliative on D/C, patient going to [facility] facility today Dementia trajectory discussed in detail with daughter and husband Continue current mode of care  May need long term care eventually and full hospice support in the near future, based on her trajectory.   Code Status/Advance Care Planning:  DNR    Symptom Management:      Palliative Prophylaxis:   Bowel Regimen   Psycho-social/Spiritual:   Desire for further Chaplaincy support:no  Additional Recommendations: Education on Hospice  Prognosis:   < 12 months  Discharge Planning: [facility] for rehab with Palliative care service follow-up      Primary Diagnoses: Present on Admission: . COPD (chronic obstructive pulmonary disease) ([place]) . CAP (community acquired pneumonia) . Dehydration . Depression . Essential hypertension . Recurrent falls while walking . Rheumatoid arthritis ([place]) . MGUS (monoclonal  gammopathy of unknown significance)   I have reviewed the medical record, interviewed the patient and family, and examined the patient. The following aspects are pertinent.  Past Medical History:  Diagnosis Date  . Allergy   . Anemia    hx of   . Anxiety   . Chronic insomnia 04/22/2012  . COPD (chronic obstructive pulmonary disease) ([place])   . Dementia   . Diverticulosis of colon   . GERD (gastroesophageal reflux disease)   . Glaucoma   . Hormone replacement therapy (postmenopausal)   . Hyperlipidemia   . Hypertension   . Low back pain   . Migraine headache    denies  . Mild obesity   . Osteoarthritis   . Osteopenia   . Osteoporosis   . Pneumonia    hx of 5 years ago   . Scoliosis   . Shortness of breath    with exertion   . Spontaneous pneumothorax    1968  .  Umbilical hernia [id number]   Social History   Social History  . Marital status: Married    Spouse name: N/A  . Number of children: N/A  . Years of education: N/A   Social History Main Topics  . Smoking status: Former Smoker    Years: 20.00    Types: Cigarettes    Quit date: 11/29/1986  . Smokeless tobacco: Never Used  . Alcohol use No     Comment: one glass of wine every 3 months  . Drug use: No  . Sexual activity: No   Other Topics Concern  . 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 Hyperlipidemia Mother   . Heart disease Mother   . Diabetes Mother    Scheduled Meds: . atenolol  50 mg Oral Daily  . azithromycin  500 mg Oral Daily  . benazepril  40 mg Oral Daily  . carbidopa-levodopa  1.5 tablet Oral TID  . cefTRIAXone (ROCEPHIN)  IV  1 g Intravenous Q24H  . divalproex  125 mg Oral BID  . feeding supplement (ENSURE ENLIVE)  237 mL Oral BID BM  . fesoterodine  4 mg Oral Daily  . folic acid  1 mg Oral Daily  . gabapentin  300 mg Oral TID  . latanoprost  1 drop Both Eyes QHS  . simvastatin  40 mg Oral q1800  . sodium chloride flush  3 mL Intravenous Q12H  . timolol  1 drop Both Eyes BID  . venlafaxine XR  150 mg Oral Q breakfast   Continuous Infusions: . sodium chloride 75 mL/hr at 01/09/17 1729   PRN Meds:.acetaminophen **OR** acetaminophen, albuterol, guaiFENesin, HYDROcodone-acetaminophen, ondansetron **OR** ondansetron (ZOFRAN) IV Medications Prior to Admission:  Prior to Admission medications   Medication Sig Start Date End Date Taking? Authorizing Provider  alendronate (FOSAMAX) 70 MG tablet Take 70 mg by mouth once a week. 11/24/16  Yes Historical Provider, MD  Ascorbic Acid (VITAMIN C) 1000 MG tablet Take 1,000 mg by mouth daily.   Yes Historical Provider, MD  atenolol (TENORMIN) 50 MG tablet TAKE 1 TABLET EVERY DAY 03/15/16  Yes Biagio Borg, MD  benazepril (LOTENSIN) 40 MG tablet  TAKE 1 TABLET EVERY DAY 03/15/16  Yes Biagio Borg, MD  calcium carbonate (OS-CAL) 600 MG TABS Take 600 mg by mouth 2 (two) times daily with a meal.   Yes Historical Provider, MD  carbidopa-levodopa (SINEMET IR)  25-100 MG tablet Take 1 tablet by mouth 3 (three) times daily. Patient taking differently: Take 1.5 tablets by mouth 3 (three) times daily.  09/20/16  Yes Rebecca S Tat, DO  cetirizine (ZYRTEC) 10 MG tablet Take 10 mg by mouth daily.   Yes Historical Provider, MD  Cholecalciferol (VITAMIN D3) 1000 UNITS CAPS Take 1 capsule by mouth daily.   Yes Historical Provider, MD  denosumab (PROLIA) 60 MG/ML SOLN injection Inject 60 mg into the skin every 6 (six) months. Administer in upper arm, thigh, or abdomen   Yes Historical Provider, MD  divalproex (DEPAKOTE) 125 MG DR tablet Take 125 mg by mouth 2 (two) times daily.   Yes Historical Provider, MD  fesoterodine (TOVIAZ) 4 MG TB24 tablet Take 1 tablet (4 mg total) by mouth daily. 12/30/16  Yes Biagio Borg, MD  folic acid (FOLVITE) 1 MG tablet Take 1 mg by mouth daily.   Yes Historical Provider, MD  gabapentin (NEURONTIN) 300 MG capsule Take 300 mg by mouth 3 (three) times daily.   Yes Historical Provider, MD  guaiFENesin (MUCINEX) 600 MG 12 hr tablet Take by mouth 2 (two) times daily as needed.    Yes Historical Provider, MD  HYDROcodone-acetaminophen (NORCO/VICODIN) 5-325 MG tablet Take 1 tablet by mouth every 8 (eight) hours as needed for moderate pain. 10/02/16  Yes Robbie Lis, MD  latanoprost (XALATAN) 0.005 % ophthalmic solution Place 1 drop into both eyes at bedtime.  10/09/13  Yes Historical Provider, MD  meloxicam (MOBIC) 7.5 MG tablet Take 15 mg by mouth daily.  07/19/16  Yes Historical Provider, MD  methotrexate 250 MG/10ML injection Inject 1 mL (25 mg total) into the skin once a week. 05/19/16  Yes Velvet Bathe, MD  Multiple Vitamin (MULTIVITAMIN) tablet Take 1 tablet by mouth daily.   Yes Historical Provider, MD  Omega-3 Fatty Acids (FISH  OIL) 1200 MG CAPS Take 2 capsules by mouth daily.    Yes Historical Provider, MD  omeprazole (PRILOSEC) 20 MG capsule TAKE 2 CAPSULES EVERY DAY 03/15/16  Yes Biagio Borg, MD  polyethylene glycol [facility] / GLYCOLAX) packet Take 17 g by mouth daily as needed.    Yes Historical Provider, MD  Sennosides-Docusate Sodium (SENNA S PO) Take 1 tablet by mouth daily.   Yes Historical Provider, MD  simvastatin (ZOCOR) 40 MG tablet TAKE 1 TABLET EVERY DAY  AT  6PM 03/15/16  Yes Biagio Borg, MD  timolol (TIMOPTIC) 0.5 % ophthalmic solution Place 1 drop into both eyes 2 (two) times daily.    Yes Historical Provider, MD  TURMERIC PO Take 1 capsule by mouth daily.   Yes Historical Provider, MD  venlafaxine XR (EFFEXOR-XR) 150 MG 24 hr capsule TAKE 1 CAPSULE EVERY DAY WITH BREAKFAST 11/18/16  Yes Biagio Borg, MD  acetaminophen (TYLENOL) 325 MG tablet Take 2 tablets (650 mg total) by mouth every 6 (six) hours as needed for mild pain (or Fever >/= 101). 01/10/17   Debbe Odea, MD  azithromycin (ZITHROMAX) 250 MG tablet Take 1 tablet (250 mg total) by mouth daily. 01/10/17   Debbe Odea, MD  cefdinir (OMNICEF) 300 MG capsule Take 1 capsule (300 mg total) by mouth 2 (two) times daily. 01/10/17   Debbe Odea, MD  famotidine (PEPCID) 20 MG tablet Take 1 tablet (20 mg total) by mouth 2 (two) times daily. 01/10/17   Debbe Odea, MD  feeding supplement, ENSURE ENLIVE, (ENSURE ENLIVE) LIQD Take 237 mLs by mouth 2 (two) times daily  between meals. 01/10/17   Debbe Odea, MD  VOLTAREN 1 % GEL Apply 1 application topically daily as needed. 01/03/17   Historical Provider, MD   Allergies  Allergen Reactions  . Codeine Nausea Only    Can take low doses  . Sulfonamide Derivatives Nausea Only    REACTION: Nausea  . Ambien [Zolpidem Tartrate] Anxiety  . Penicillins Rash    .Marland KitchenHas patient had a PCN reaction causing immediate rash, facial/tongue/throat swelling, SOB or lightheadedness with hypotension: No Has patient had a PCN  reaction causing severe rash involving mucus membranes or skin necrosis: No Has patient had a PCN reaction that required hospitalization No Has patient had a PCN reaction occurring within the last 10 years: No If all of the above answers are "NO", then may proceed with Cephalosporin use.   Marland Kitchen Percocet [Oxycodone-Acetaminophen] Itching   Review of Systems + for pain in back at times,   Physical Exam Awake alert Frail Bruise on face and nose from recent fall Clear S1 S2 Abdomen soft No edema  Vital Signs: BP 130/75 (BP Location: Left Arm)   Pulse 69   Temp 98.8 F (37.1 C) (Axillary)   Resp 18   Ht 5\' 4"  (1.626 m)   Wt 53.5 kg (118 lb)   SpO2 98%   BMI 20.25 kg/m  Pain Assessment: 0-10   Pain Score: 0-No pain   SpO2: SpO2: 98 % O2 Device:SpO2: 98 % O2 Flow Rate: .   IO: Intake/output summary:  Intake/Output Summary (Last 24 hours) at 01/10/17 1102 Last data filed at 01/10/17 0600  Gross per 24 hour  Intake             2540 ml  Output              775 ml  Net             1765 ml    LBM: Last BM Date: 01/08/17 Baseline Weight: Weight: 53.5 kg (118 lb) Most recent weight: Weight: 53.5 kg (118 lb)     Palliative Assessment/Data:   Flowsheet Rows   Flowsheet Row Most Recent Value  Intake Tab  Referral Department  Hospitalist  Unit at Time of Referral  Med/Surg Unit  Palliative Care Primary Diagnosis  Neurology  Date Notified  01/09/17  Palliative Care Type  New Palliative care  Reason for referral  Clarify Goals of Care, Counsel Regarding Hospice  Date of Admission  01/07/17  Date first seen by Palliative Care  01/10/17  # of days IP prior to Palliative referral  2  Clinical Assessment  Palliative Performance Scale Score  30%  Pain Max last 24 hours  4  Pain Min Last 24 hours  3  Psychosocial & Spiritual Assessment  Palliative Care Outcomes  Patient/Family meeting held?  Yes  Who was at the meeting?  patient husband daughter   Palliative Care Outcomes   Clarified goals of care      Time In:  10 Time Out:  11 Time Total:  60 minutes   Greater than 50%  of this time was spent counseling and coordinating care related to the above assessment and plan.  Signed by: Loistine Chance, MD  [phone]  Please contact Palliative Medicine Team phone at [phone] for questions and concerns.  For individual provider: See Shea Evans

## 2017-01-10 NOTE — Patient Outreach (Signed)
Care coordination: Notified [facility] hospital liaison of admission.    I was notified that patient is discharging to SNF.    Plan: will await update from [facility] LCSW about discharge plans from SNF.  [name], RN, BSN, CEN [facility] [employer] [phone]

## 2017-01-10 NOTE — Consult Note (Signed)
   [facility] Inpatient Consult   01/10/2017  AMABELLE WARDLE 1941-06-18 [id number]   Made aware of hospitalization by [facility]. Mrs. Domeier is active with [facility] program. She has been followed by [facility] LCSW. Please see chart review then notes for further patient outreach details by [facility] team. Confirmed with inpatient LCSW that discharge plan is for [facility]. Also noted Palliative Medicine consult and recommendations. Will update [facility] Community team.    Marthenia Rolling, [place], RN,BSN [facility] Liaison [phone]

## 2017-01-10 NOTE — Telephone Encounter (Signed)
Pt was on TCM list admitted for CAP (community acquired pneumonia). Pt was D/C 2/12 to Palliative care to follow as SNF in case patient declines- this has been dicussed with family in detail and they understand and accept it...Johny Chess

## 2017-01-10 NOTE — Progress Notes (Signed)
Report given to [facility]. Tara Neal A

## 2017-01-10 NOTE — Progress Notes (Signed)
Chaplain arrived at the patient's room to discuss the AD which came as a referral from the patient's physician.  According to the tech, the patient had just fell asleep.  Chaplain will check back a little later to speak with patient/family.  Dorna Bloom Resident Pager [phone]

## 2017-01-10 NOTE — Clinical Social Work Placement (Signed)
Medical Social Worker facilitated patient discharge including contacting patient family and facility to confirm patient discharge plans.  Clinical information faxed to facility and family agreeable with plan.  MSW arranged ambulance transport via [facility] to [place] and [place].  RN to call report prior to discharge.  Medical Social Worker will sign off for now as social work intervention is no longer needed. Please consult [place] again if new need arises.   CLINICAL SOCIAL WORK PLACEMENT  NOTE  Date:  01/10/2017  Patient Details  Name: Tara Neal MRN: [id number] Date of Birth: December 09, 1940  Clinical Social Work is seeking post-discharge placement for this patient at the [place] level of care (*CSW will initial, date and re-position this form in  chart as items are completed):  Yes   Patient/family provided with [place] Work Department's list of facilities offering this level of care within the geographic area requested by the patient (or if unable, by the patient's family).  Yes   Patient/family informed of their freedom to choose among providers that offer the needed level of care, that participate in Medicare, Medicaid or managed care program needed by the patient, have an available bed and are willing to accept the patient.  Yes   Patient/family informed of [facility]'s ownership interest in [facility] and [facility], as well as of the fact that they are under no obligation to receive care at these facilities.  PASRR submitted to EDS on       PASRR number received on       Existing PASRR number confirmed on 01/08/17     FL2 transmitted to all facilities in geographic area requested by pt/family on 01/08/17     FL2 transmitted to all facilities within larger geographic area on       Patient informed that his/her managed care company has contracts with or will negotiate with certain facilities, including the following:         Yes   Patient/family informed of bed offers received.  Patient chooses bed at  ([facility] )     Physician recommends and patient chooses bed at      Patient to be transferred to  ([facility] ) on 01/10/17.  Patient to be transferred to facility by  [facility])     Patient family notified on 01/10/17 of transfer.  Name of family member notified:   (Pt's spouse and daughter: Wynonia Lawman and [name])     PHYSICIAN Please prepare priority discharge summary, including medications, Please sign FL2, Please sign DNR     Additional Comment:    _______________________________________________ Glendon Axe A 01/10/2017, 11:02 AM

## 2017-01-13 ENCOUNTER — Other Ambulatory Visit: Payer: Self-pay | Admitting: *Deleted

## 2017-01-13 DIAGNOSIS — R531 Weakness: Secondary | ICD-10-CM | POA: Diagnosis not present

## 2017-01-13 LAB — CULTURE, BLOOD (ROUTINE X 2)
Culture: NO GROWTH
Culture: NO GROWTH

## 2017-01-20 ENCOUNTER — Ambulatory Visit: Payer: Medicare Other | Admitting: *Deleted

## 2017-01-20 ENCOUNTER — Other Ambulatory Visit: Payer: Self-pay | Admitting: *Deleted

## 2017-01-20 NOTE — Patient Outreach (Signed)
[facility]) Care Management  01/20/2017  KEIVA TRUSZKOWSKI 09/04/1941 [id number]   CSW received voicemail from patient's daughter stating they have moved her mom to the long term care part of [facility]. "as long as the Medicaid goes through she is ok to stay".   CSW will plan follow up call to SNF to confirm and update [facility] as well.     Eduard Clos, MSW, [place] Worker  [place] [phone]

## 2017-01-21 ENCOUNTER — Encounter: Payer: Self-pay | Admitting: *Deleted

## 2017-01-21 ENCOUNTER — Other Ambulatory Visit: Payer: Self-pay

## 2017-01-21 ENCOUNTER — Other Ambulatory Visit: Payer: Self-pay | Admitting: *Deleted

## 2017-01-21 NOTE — Patient Outreach (Signed)
Tara Neal) Care Management  [facility] Social Work  01/21/2017  Tara Neal 06-May-1941 [id number]  Subjective:  "mom is staying long term at [facility]".  Objective:  CSW to assist patient with commuity based resources to aide in her well-being, quality of life and overall safety/needs; including long term placement assistance.   Encounter Medications:  Outpatient Encounter Prescriptions as of 01/21/2017  Medication Sig Note  . acetaminophen (TYLENOL) 325 MG tablet Take 2 tablets (650 mg total) by mouth every 6 (six) hours as needed for mild pain (or Fever >/= 101).   Marland Kitchen alendronate (FOSAMAX) 70 MG tablet Take 70 mg by mouth once a week.   . Ascorbic Acid (VITAMIN C) 1000 MG tablet Take 1,000 mg by mouth daily.   Marland Kitchen azithromycin (ZITHROMAX) 250 MG tablet Take 1 tablet (250 mg total) by mouth daily.   . benazepril (LOTENSIN) 40 MG tablet TAKE 1 TABLET EVERY DAY   . calcium carbonate (OS-CAL) 600 MG TABS Take 600 mg by mouth 2 (two) times daily with a meal.   . carbidopa-levodopa (SINEMET IR) 25-100 MG tablet Take 1 tablet by mouth 3 (three) times daily. (Patient taking differently: Take 1.5 tablets by mouth 3 (three) times daily. )   . cefdinir (OMNICEF) 300 MG capsule Take 1 capsule (300 mg total) by mouth 2 (two) times daily.   . cetirizine (ZYRTEC) 10 MG tablet Take 10 mg by mouth daily.   . Cholecalciferol (VITAMIN D3) 1000 UNITS CAPS Take 1 capsule by mouth daily.   Marland Kitchen denosumab (PROLIA) 60 MG/ML SOLN injection Inject 60 mg into the skin every 6 (six) months. Administer in upper arm, thigh, or abdomen   . divalproex (DEPAKOTE) 125 MG DR tablet Take 125 mg by mouth 2 (two) times daily.   . famotidine (PEPCID) 20 MG tablet Take 1 tablet (20 mg total) by mouth 2 (two) times daily.   . feeding supplement, ENSURE ENLIVE, (ENSURE ENLIVE) LIQD Take 237 mLs by mouth 2 (two) times daily between meals.   . fesoterodine (TOVIAZ) 4 MG TB24 tablet Take 1 tablet (4 mg total) by mouth daily.    . folic acid (FOLVITE) 1 MG tablet Take 1 mg by mouth daily.   Marland Kitchen gabapentin (NEURONTIN) 300 MG capsule Take 300 mg by mouth 3 (three) times daily.   Marland Kitchen guaiFENesin (MUCINEX) 600 MG 12 hr tablet Take by mouth 2 (two) times daily as needed.    . latanoprost (XALATAN) 0.005 % ophthalmic solution Place 1 drop into both eyes at bedtime.    . meloxicam (MOBIC) 7.5 MG tablet Take 15 mg by mouth daily.    . methotrexate 250 MG/10ML injection Inject 1 mL (25 mg total) into the skin once a week. 09/29/2016: tuesdays  . Multiple Vitamin (MULTIVITAMIN) tablet Take 1 tablet by mouth daily.   . Omega-3 Fatty Acids (FISH OIL) 1200 MG CAPS Take 2 capsules by mouth daily.    . polyethylene glycol (MIRALAX / GLYCOLAX) packet Take 17 g by mouth daily as needed.    Orlie Dakin Sodium (SENNA S PO) Take 1 tablet by mouth daily.   . simvastatin (ZOCOR) 40 MG tablet TAKE 1 TABLET EVERY DAY  AT  6PM   . timolol (TIMOPTIC) 0.5 % ophthalmic solution Place 1 drop into both eyes 2 (two) times daily.    . TURMERIC PO Take 1 capsule by mouth daily.   Marland Kitchen venlafaxine XR (EFFEXOR-XR) 150 MG 24 hr capsule TAKE 1 CAPSULE EVERY DAY WITH BREAKFAST   .  VOLTAREN 1 % GEL Apply 1 application topically daily as needed. 01/07/2017: New order, has not picked up   No facility-administered encounter medications on file as of 01/21/2017.     Functional Status:  In your present state of health, do you have any difficulty performing the following activities: 01/07/2017 12/14/2016  Hearing? N Y  Vision? N N  Difficulty concentrating or making decisions? Tara Neal  Walking or climbing stairs? Y Y  Dressing or bathing? Y Y  Doing errands, shopping? Tara Neal  Preparing Food and eating ? - Y  Using the Toilet? - Y  In the past six months, have you accidently leaked urine? - Y  Do you have problems with loss of bowel control? - N  Managing your Medications? - Y  Managing your Finances? - Y  Housekeeping or managing your Housekeeping? - Y  Some  recent data might be hidden    Fall/Depression Screening:  PHQ 2/9 Scores 01/06/2017 12/14/2016 11/29/2016 11/18/2016 03/16/2016 04/10/2015 06/19/2014  PHQ - 2 Score 2 6 1 1  0 - 0  PHQ- 9 Score 11 23 - - - - -  Exception Documentation - - - - - Patient refusal -    Assessment:  CSW has confirmed with patient's daughter and SNF rep at [facility], plans for long term placement at SNF.  Family is quite pleased a long term bed has opened up.   CSW discussed plans for case closure with daughter. She understands and shared;  and are appreciative of CSW involvement, support.  "Your counseling and help are very much appreciated".         [facility] CM Care Plan Problem One   Flowsheet Row Most Recent Value  Care Plan Problem One  Patient reported to be depressed  Role Documenting the Problem One  Clinical Social Worker  Care Plan for Problem One  Active  [facility] CM Short Term Goal #1 (0-30 days)  Family will discuss with PCP resuming depakote within the next 2 weeks.  [facility] CM Short Term Goal #1 Start Date  01/06/17  [facility] CM Short Term Goal #1 Met Date  01/21/17  Interventions for Short Term Goal #1  CSW encouraged family to call PCP to discuss resuming Depakote.    [facility] CM Care Plan Problem Two   Flowsheet Row Most Recent Value  Care Plan Problem Two  Family requesting community based services to offer support in home.   Role Documenting the Problem Two  Clinical Social Worker  Care Plan for Problem Two  Active  [facility] CM Short Term Goal #1 (0-30 days)  Family will be educated adn linked with community based services in the next 30 days.  [facility] CM Short Term Goal #1 Start Date  01/06/17  [facility] CM Short Term Goal #1 Met Date   01/21/17  Interventions for Short Term Goal #2   CSW discussed and will link/provide services.  [facility] CM Short Term Goal #2 (0-30 days)  Family will apply for Medicaid and contact [facility] regarding LTC placement,  [facility] CM Short Term Goal #2 Start Date  01/06/17  [facility] CM Short Term Goal #2 Met  Date  01/21/17  Interventions for Short Term Goal #2  CSW encoaurged family to apply for Medicaid and discuss placement with [facility].       Plan:  CSW will advise [facility] of these plans as well as PCP and plan case closure at this time.        Eduard Clos, MSW,  [place] Worker  [place] [phone]

## 2017-01-21 NOTE — Patient Outreach (Signed)
Case closure: Notified from [facility] that patient will be staying at [facility] long term.   PLAN: Will close case.  Will send case closure letter to MD and patient.  [name], RN, BSN, CEN [facility] [employer] [phone]

## 2017-01-22 ENCOUNTER — Encounter (HOSPITAL_COMMUNITY): Payer: Self-pay | Admitting: Emergency Medicine

## 2017-01-22 ENCOUNTER — Inpatient Hospital Stay (HOSPITAL_COMMUNITY)
Admission: EM | Admit: 2017-01-22 | Discharge: 2017-01-25 | DRG: 552 | Disposition: A | Discharge status: Hospice | Payer: Medicare Other | Attending: Internal Medicine | Admitting: Internal Medicine

## 2017-01-22 ENCOUNTER — Emergency Department (HOSPITAL_COMMUNITY): Payer: Medicare Other

## 2017-01-22 DIAGNOSIS — R630 Anorexia: Secondary | ICD-10-CM

## 2017-01-22 DIAGNOSIS — M810 Age-related osteoporosis without current pathological fracture: Secondary | ICD-10-CM | POA: Diagnosis present

## 2017-01-22 DIAGNOSIS — S199XXA Unspecified injury of neck, initial encounter: Secondary | ICD-10-CM | POA: Diagnosis not present

## 2017-01-22 DIAGNOSIS — W19XXXA Unspecified fall, initial encounter: Secondary | ICD-10-CM | POA: Diagnosis present

## 2017-01-22 DIAGNOSIS — F0280 Dementia in other diseases classified elsewhere without behavioral disturbance: Secondary | ICD-10-CM | POA: Diagnosis present

## 2017-01-22 DIAGNOSIS — R627 Adult failure to thrive: Secondary | ICD-10-CM | POA: Diagnosis present

## 2017-01-22 DIAGNOSIS — Z8249 Family history of ischemic heart disease and other diseases of the circulatory system: Secondary | ICD-10-CM

## 2017-01-22 DIAGNOSIS — F32A Depression, unspecified: Secondary | ICD-10-CM | POA: Diagnosis present

## 2017-01-22 DIAGNOSIS — M25521 Pain in right elbow: Secondary | ICD-10-CM | POA: Diagnosis not present

## 2017-01-22 DIAGNOSIS — S59912A Unspecified injury of left forearm, initial encounter: Secondary | ICD-10-CM | POA: Diagnosis not present

## 2017-01-22 DIAGNOSIS — E785 Hyperlipidemia, unspecified: Secondary | ICD-10-CM | POA: Diagnosis not present

## 2017-01-22 DIAGNOSIS — M79632 Pain in left forearm: Secondary | ICD-10-CM | POA: Diagnosis not present

## 2017-01-22 DIAGNOSIS — M542 Cervicalgia: Secondary | ICD-10-CM | POA: Diagnosis not present

## 2017-01-22 DIAGNOSIS — G309 Alzheimer's disease, unspecified: Secondary | ICD-10-CM | POA: Diagnosis not present

## 2017-01-22 DIAGNOSIS — H409 Unspecified glaucoma: Secondary | ICD-10-CM | POA: Diagnosis not present

## 2017-01-22 DIAGNOSIS — S12110A Anterior displaced Type II dens fracture, initial encounter for closed fracture: Principal | ICD-10-CM | POA: Diagnosis present

## 2017-01-22 DIAGNOSIS — S70212A Abrasion, left hip, initial encounter: Secondary | ICD-10-CM | POA: Diagnosis not present

## 2017-01-22 DIAGNOSIS — F329 Major depressive disorder, single episode, unspecified: Secondary | ICD-10-CM | POA: Diagnosis present

## 2017-01-22 DIAGNOSIS — S0003XA Contusion of scalp, initial encounter: Secondary | ICD-10-CM | POA: Diagnosis not present

## 2017-01-22 DIAGNOSIS — S59901A Unspecified injury of right elbow, initial encounter: Secondary | ICD-10-CM | POA: Diagnosis not present

## 2017-01-22 DIAGNOSIS — S299XXA Unspecified injury of thorax, initial encounter: Secondary | ICD-10-CM | POA: Diagnosis not present

## 2017-01-22 DIAGNOSIS — Z79890 Hormone replacement therapy (postmenopausal): Secondary | ICD-10-CM

## 2017-01-22 DIAGNOSIS — F419 Anxiety disorder, unspecified: Secondary | ICD-10-CM | POA: Diagnosis present

## 2017-01-22 DIAGNOSIS — R0602 Shortness of breath: Secondary | ICD-10-CM

## 2017-01-22 DIAGNOSIS — Z833 Family history of diabetes mellitus: Secondary | ICD-10-CM

## 2017-01-22 DIAGNOSIS — G20 Parkinson's disease: Secondary | ICD-10-CM | POA: Diagnosis not present

## 2017-01-22 DIAGNOSIS — S12000A Unspecified displaced fracture of first cervical vertebra, initial encounter for closed fracture: Secondary | ICD-10-CM | POA: Diagnosis present

## 2017-01-22 DIAGNOSIS — S6991XA Unspecified injury of right wrist, hand and finger(s), initial encounter: Secondary | ICD-10-CM | POA: Diagnosis not present

## 2017-01-22 DIAGNOSIS — K219 Gastro-esophageal reflux disease without esophagitis: Secondary | ICD-10-CM | POA: Diagnosis present

## 2017-01-22 DIAGNOSIS — Z87891 Personal history of nicotine dependence: Secondary | ICD-10-CM

## 2017-01-22 DIAGNOSIS — Z66 Do not resuscitate: Secondary | ICD-10-CM | POA: Diagnosis present

## 2017-01-22 DIAGNOSIS — I10 Essential (primary) hypertension: Secondary | ICD-10-CM | POA: Diagnosis present

## 2017-01-22 DIAGNOSIS — Z515 Encounter for palliative care: Secondary | ICD-10-CM | POA: Diagnosis present

## 2017-01-22 DIAGNOSIS — W1830XA Fall on same level, unspecified, initial encounter: Secondary | ICD-10-CM | POA: Diagnosis present

## 2017-01-22 DIAGNOSIS — F0390 Unspecified dementia without behavioral disturbance: Secondary | ICD-10-CM | POA: Diagnosis present

## 2017-01-22 DIAGNOSIS — J449 Chronic obstructive pulmonary disease, unspecified: Secondary | ICD-10-CM | POA: Diagnosis present

## 2017-01-22 DIAGNOSIS — S0101XA Laceration without foreign body of scalp, initial encounter: Secondary | ICD-10-CM

## 2017-01-22 LAB — COMPREHENSIVE METABOLIC PANEL
ALBUMIN: 3.5 g/dL (ref 3.5–5.0)
ALT: 14 U/L (ref 14–54)
AST: 29 U/L (ref 15–41)
Alkaline Phosphatase: 56 U/L (ref 38–126)
Anion gap: 7 (ref 5–15)
BUN: 13 mg/dL (ref 6–20)
CHLORIDE: 109 mmol/L (ref 101–111)
CO2: 22 mmol/L (ref 22–32)
Calcium: 8.7 mg/dL — ABNORMAL LOW (ref 8.9–10.3)
Creatinine, Ser: 0.61 mg/dL (ref 0.44–1.00)
GFR calc Af Amer: >60 mL/min (ref >60)
GLUCOSE: 126 mg/dL — AB (ref 65–99)
POTASSIUM: 3.9 mmol/L (ref 3.5–5.1)
SODIUM: 138 mmol/L (ref 135–145)
Total Bilirubin: 0.6 mg/dL (ref 0.3–1.2)
Total Protein: 6.2 g/dL — ABNORMAL LOW (ref 6.5–8.1)

## 2017-01-22 LAB — CBC WITH DIFFERENTIAL/PLATELET
Basophils Absolute: 0 10*3/uL (ref 0.0–0.1)
Basophils Relative: 0 %
EOS PCT: 0 %
Eosinophils Absolute: 0 10*3/uL (ref 0.0–0.7)
HCT: 35 % — ABNORMAL LOW (ref 36.0–46.0)
HEMOGLOBIN: 11.6 g/dL — AB (ref 12.0–15.0)
Lymphocytes Relative: 5 %
Lymphs Abs: 0.8 10*3/uL (ref 0.7–4.0)
MCH: 32.6 pg (ref 26.0–34.0)
MCHC: 33.1 g/dL (ref 30.0–36.0)
MCV: 98.3 fL (ref 78.0–100.0)
Monocytes Absolute: 0.7 10*3/uL (ref 0.1–1.0)
Monocytes Relative: 4 %
Neutro Abs: 14.5 10*3/uL — ABNORMAL HIGH (ref 1.7–7.7)
Neutrophils Relative %: 91 %
PLATELETS: 263 10*3/uL (ref 150–400)
RBC: 3.56 MIL/uL — AB (ref 3.87–5.11)
RDW: 14.1 % (ref 11.5–15.5)
WBC: 16.1 10*3/uL — AB (ref 4.0–10.5)

## 2017-01-22 LAB — PROTIME-INR
INR: 1.01
Prothrombin Time: 13.3 seconds (ref 11.4–15.2)

## 2017-01-22 LAB — PROCALCITONIN

## 2017-01-22 MED ORDER — ONDANSETRON HCL 4 MG/2ML IJ SOLN: 4.0000 mg | Freq: Four times a day (QID) | INTRAMUSCULAR | Status: DC | PRN

## 2017-01-22 MED ORDER — POLYETHYLENE GLYCOL 3350 17 G PO PACK
17.0000 g | PACK | Freq: Every day | ORAL | Status: DC
  Administered 2017-01-23 – 2017-01-24 (×2): 17 g via ORAL
  Filled 2017-01-22 (×2): qty 1

## 2017-01-22 MED ORDER — SODIUM CHLORIDE 0.9% FLUSH
3.0000 mL | Freq: Two times a day (BID) | INTRAVENOUS | Status: DC
  Administered 2017-01-22 – 2017-01-23 (×2): 3 mL via INTRAVENOUS

## 2017-01-22 MED ORDER — FAMOTIDINE 20 MG PO TABS
20.0000 mg | ORAL_TABLET | Freq: Two times a day (BID) | ORAL | Status: DC
  Administered 2017-01-22 – 2017-01-24 (×3): 20 mg via ORAL
  Filled 2017-01-22 (×5): qty 1

## 2017-01-22 MED ORDER — GABAPENTIN 300 MG PO CAPS
300.0000 mg | ORAL_CAPSULE | Freq: Three times a day (TID) | ORAL | Status: DC
Start: 2017-01-22 — End: 2017-01-25
  Administered 2017-01-22 – 2017-01-24 (×5): 300 mg via ORAL
  Filled 2017-01-22 (×6): qty 1

## 2017-01-22 MED ORDER — SODIUM CHLORIDE 0.9 % IV SOLN
INTRAVENOUS | Status: DC
  Administered 2017-01-22 – 2017-01-23 (×2): via INTRAVENOUS

## 2017-01-22 MED ORDER — ENSURE ENLIVE PO LIQD
237.0000 mL | Freq: Two times a day (BID) | ORAL | Status: DC
  Administered 2017-01-22 – 2017-01-24 (×3): 237 mL via ORAL

## 2017-01-22 MED ORDER — FESOTERODINE FUMARATE ER 8 MG PO TB24
8.0000 mg | ORAL_TABLET | Freq: Every day | ORAL | Status: DC
  Administered 2017-01-22 – 2017-01-24 (×3): 8 mg via ORAL
  Filled 2017-01-22 (×4): qty 1

## 2017-01-22 MED ORDER — ACETAMINOPHEN 325 MG PO TABS: 650.0000 mg | ORAL_TABLET | Freq: Four times a day (QID) | ORAL | Status: DC | PRN

## 2017-01-22 MED ORDER — TIMOLOL MALEATE 0.5 % OP SOLN
1.0000 [drp] | Freq: Two times a day (BID) | OPHTHALMIC | Status: DC
  Administered 2017-01-22 – 2017-01-24 (×5): 1 [drp] via OPHTHALMIC
  Filled 2017-01-22: qty 5

## 2017-01-22 MED ORDER — CARBIDOPA-LEVODOPA 25-100 MG PO TABS
1.0000 | ORAL_TABLET | Freq: Three times a day (TID) | ORAL | Status: DC
  Administered 2017-01-22 – 2017-01-24 (×5): 1 via ORAL
  Filled 2017-01-22 (×6): qty 1

## 2017-01-22 MED ORDER — DIVALPROEX SODIUM 125 MG PO DR TAB
125.0000 mg | DELAYED_RELEASE_TABLET | Freq: Two times a day (BID) | ORAL | Status: DC
  Administered 2017-01-22 – 2017-01-24 (×3): 125 mg via ORAL
  Filled 2017-01-22 (×6): qty 1

## 2017-01-22 MED ORDER — VENLAFAXINE HCL ER 150 MG PO CP24
150.0000 mg | ORAL_CAPSULE | Freq: Every day | ORAL | Status: DC
  Administered 2017-01-23 – 2017-01-24 (×2): 150 mg via ORAL
  Filled 2017-01-22 (×3): qty 2
  Filled 2017-01-22 (×3): qty 1
  Filled 2017-01-22: qty 2

## 2017-01-22 MED ORDER — BENAZEPRIL HCL 40 MG PO TABS
40.0000 mg | ORAL_TABLET | Freq: Every day | ORAL | Status: DC
  Administered 2017-01-22 – 2017-01-24 (×3): 40 mg via ORAL
  Filled 2017-01-22 (×3): qty 2
  Filled 2017-01-22: qty 1
  Filled 2017-01-22: qty 2
  Filled 2017-01-22 (×3): qty 1

## 2017-01-22 MED ORDER — SIMVASTATIN 40 MG PO TABS
40.0000 mg | ORAL_TABLET | Freq: Every day | ORAL | Status: DC
  Administered 2017-01-22 – 2017-01-23 (×2): 40 mg via ORAL
  Filled 2017-01-22 (×2): qty 1

## 2017-01-22 MED ORDER — SENNOSIDES-DOCUSATE SODIUM 8.6-50 MG PO TABS
1.0000 | ORAL_TABLET | Freq: Every day | ORAL | Status: DC
  Administered 2017-01-22 – 2017-01-24 (×3): 1 via ORAL
  Filled 2017-01-22 (×4): qty 1

## 2017-01-22 MED ORDER — LATANOPROST 0.005 % OP SOLN
1.0000 [drp] | Freq: Every day | OPHTHALMIC | Status: DC
  Administered 2017-01-22 – 2017-01-24 (×3): 1 [drp] via OPHTHALMIC
  Filled 2017-01-22: qty 2.5

## 2017-01-22 MED ORDER — LIDOCAINE HCL 2 % IJ SOLN
15.0000 mL | Freq: Once | INTRAMUSCULAR | Status: AC
  Administered 2017-01-22: 300 mg
  Filled 2017-01-22: qty 20

## 2017-01-22 MED ORDER — ONDANSETRON HCL 4 MG PO TABS: 4.0000 mg | ORAL_TABLET | Freq: Four times a day (QID) | ORAL | Status: DC | PRN

## 2017-01-22 MED ORDER — MORPHINE SULFATE (PF) 4 MG/ML IV SOLN
1.0000 mg | INTRAVENOUS | Status: DC | PRN
  Administered 2017-01-22 – 2017-01-23 (×5): 1 mg via INTRAVENOUS
  Filled 2017-01-22 (×5): qty 1

## 2017-01-22 MED ORDER — ACETAMINOPHEN 650 MG RE SUPP: 650.0000 mg | Freq: Four times a day (QID) | RECTAL | Status: DC | PRN

## 2017-01-22 NOTE — Progress Notes (Signed)
Pt admitted to the unit from ED; pt Alert but delayed response with verbalization. UTA cognition; family at bedside; all oriented to the unit and room. Fall prevention, safety precaution education completed with pt and family; call light within reach; Gauze dsg to forehead has laceration with sutures; multiple skin tears to bilateral upper extremities and face. No pressure ulcer noted during admission. Aspen collar on and aligned; bed alarm on. Call light within reach and family remains at bedside. Will closely monitor. P[employer] RN

## 2017-01-22 NOTE — Consult Note (Signed)
CC:  Chief Complaint  Patient presents with  . Fall    HPI: Tara Neal is a 76 y.o. female who was brought to ER by [facility] from [place] after experiencing an unwitnessed fall. She was found awake with unknown LOC. Does not recall event but does have Alzheimer's. Family reports she has minimal EOM. Family feels as though she is at baseline with the exception of just receiving fentanyl. She is noncommunicative with the exception of grunts. Family reports she is a DNR. They are hesitant to have surgery if that even becomes an option. Complex PMH below.  PMH: Past Medical History:  Diagnosis Date  . Allergy   . Anemia    hx of   . Anxiety   . Chronic insomnia 04/22/2012  . COPD (chronic obstructive pulmonary disease) ([place])   . Dementia   . Diverticulosis of colon   . GERD (gastroesophageal reflux disease)   . Glaucoma   . Hormone replacement therapy (postmenopausal)   . Hyperlipidemia   . Hypertension   . Low back pain   . Migraine headache    denies  . Mild obesity   . Osteoarthritis   . Osteopenia   . Osteoporosis   . Pneumonia    hx of 5 years ago   . Scoliosis   . Shortness of breath    with exertion   . Spontaneous pneumothorax    1968  . Umbilical hernia [id number]    PSH: Past Surgical History:  Procedure Laterality Date  . EXCISION/RELEASE BURSA HIP  04/26/2012   Procedure: EXCISION/RELEASE BURSA HIP;  Surgeon: Gearlean Alf, MD;  Location: [facility];  Service: Orthopedics;  Laterality: Right;  . EYE SURGERY     - bil cataract surg and Retina surgery to left eye  . HERNIA REPAIR  [id number]   umbilical hernia  . HIP ADDUCTOR TENOTOMY    . IR GENERIC HISTORICAL  06/17/2016   IR RADIOLOGIST EVAL & MGMT 06/17/2016 [facility]  . IR GENERIC HISTORICAL  07/01/2016   IR VERTEBROPLASTY LUMBAR BX INC UNI/BIL INC/INJECT/IMAGING 07/01/2016 Luanne Bras, MD [facility]  . IR GENERIC HISTORICAL  07/16/2016   IR RADIOLOGIST EVAL & MGMT 07/16/2016 [facility]  . IR  GENERIC HISTORICAL  10/22/2016   IR VERTEBROPLASTY CERV/THOR BX INC UNI/BIL INC/INJECT/IMAGING 10/22/2016 Luanne Bras, MD [facility]  . IR GENERIC HISTORICAL  10/22/2016   IR VERTEBROPLASTY EA ADDL (T&LS) BX INC UNI/BIL INC INJECT/IMAGING 10/22/2016 Luanne Bras, MD [facility]  . LUMBAR DISC SURGERY      x 2  . OTHER SURGICAL HISTORY     benign growth removed from right under ear at age 73   . SHOULDER ARTHROSCOPY     left shoulder  . TONSILLECTOMY    . TONSILLECTOMY    . WRIST FRACTURE SURGERY     plate in left wrist/ fractured 2 times    SH: Social History  Substance Use Topics  . Smoking status: Former Smoker    Years: 20.00    Types: Cigarettes    Quit date: 11/29/1986  . Smokeless tobacco: Never Used  . Alcohol use No     Comment: one glass of wine every 3 months    MEDS: Prior to Admission medications   Medication Sig Start Date End Date Taking? Authorizing Provider  acetaminophen (TYLENOL) 325 MG tablet Take 2 tablets (650 mg total) by mouth every 6 (six) hours as needed for mild pain (or Fever >/= 101). 01/10/17  Yes Saima  Rizwan, MD  alendronate (FOSAMAX) 70 MG tablet Take 70 mg by mouth every Saturday.  11/24/16  Yes Historical Provider, MD  Ascorbic Acid (VITAMIN C) 1000 MG tablet Take 1,000 mg by mouth daily.   Yes Historical Provider, MD  benazepril (LOTENSIN) 40 MG tablet TAKE 1 TABLET EVERY DAY 03/15/16  Yes Biagio Borg, MD  calcium carbonate (OS-CAL) 600 MG TABS Take 600 mg by mouth 2 (two) times daily with a meal.   Yes Historical Provider, MD  carbidopa-levodopa (SINEMET IR) 25-100 MG tablet Take 1 tablet by mouth 3 (three) times daily. 09/20/16  Yes Rebecca S Tat, DO  cetirizine (ZYRTEC) 10 MG tablet Take 10 mg by mouth daily.   Yes Historical Provider, MD  Cholecalciferol (VITAMIN D3) 1000 UNITS CAPS Take 1,000 Units by mouth daily.    Yes Historical Provider, MD  divalproex (DEPAKOTE) 125 MG DR tablet Take 125 mg by mouth 2 (two) times daily.    Yes Historical Provider, MD  famotidine (PEPCID) 20 MG tablet Take 1 tablet (20 mg total) by mouth 2 (two) times daily. 01/10/17  Yes Debbe Odea, MD  feeding supplement, ENSURE ENLIVE, (ENSURE ENLIVE) LIQD Take 237 mLs by mouth 2 (two) times daily between meals. 01/10/17  Yes Debbe Odea, MD  folic acid (FOLVITE) 1 MG tablet Take 1 mg by mouth daily.   Yes Historical Provider, MD  gabapentin (NEURONTIN) 300 MG capsule Take 300 mg by mouth 3 (three) times daily.   Yes Historical Provider, MD  guaiFENesin (MUCINEX) 600 MG 12 hr tablet Take by mouth 2 (two) times daily as needed.    Yes Historical Provider, MD  latanoprost (XALATAN) 0.005 % ophthalmic solution Place 1 drop into both eyes at bedtime.  10/09/13  Yes Historical Provider, MD  meloxicam (MOBIC) 15 MG tablet Take 15 mg by mouth daily.  07/19/16  Yes Historical Provider, MD  methotrexate 250 MG/10ML injection Inject 1 mL (25 mg total) into the skin once a week. Patient taking differently: Inject 25 mg into the skin every Wednesday.  05/19/16  Yes Velvet Bathe, MD  Multiple Vitamin (TAB-A-VITE PO) Take 1 tablet by mouth daily.   Yes Historical Provider, MD  Omega-3 Fatty Acids (SEA-OMEGA 30) 1200 MG CAPS Take 2,400 mg by mouth daily.   Yes Historical Provider, MD  polyethylene glycol (MIRALAX / GLYCOLAX) packet Take 17 g by mouth daily.    Yes Historical Provider, MD  senna-docusate (SENEXON-S) 8.6-50 MG tablet Take 1 tablet by mouth daily.   Yes Historical Provider, MD  simvastatin (ZOCOR) 40 MG tablet TAKE 1 TABLET EVERY DAY  AT  6PM 03/15/16  Yes Biagio Borg, MD  timolol (TIMOPTIC) 0.5 % ophthalmic solution Place 1 drop into both eyes 2 (two) times daily.    Yes Historical Provider, MD  tolterodine (DETROL LA) 4 MG 24 hr capsule Take 4 mg by mouth daily.   Yes Historical Provider, MD  venlafaxine XR (EFFEXOR-XR) 150 MG 24 hr capsule TAKE 1 CAPSULE EVERY DAY WITH BREAKFAST 11/18/16  Yes Biagio Borg, MD  azithromycin (ZITHROMAX) 250 MG  tablet Take 1 tablet (250 mg total) by mouth daily. Patient not taking: Reported on 01/22/2017 01/10/17   Debbe Odea, MD  cefdinir (OMNICEF) 300 MG capsule Take 1 capsule (300 mg total) by mouth 2 (two) times daily. Patient not taking: Reported on 01/22/2017 01/10/17   Debbe Odea, MD  denosumab (PROLIA) 60 MG/ML SOLN injection Inject 60 mg into the skin every 6 (six) months. Administer  in upper arm, thigh, or abdomen    Historical Provider, MD  fesoterodine (TOVIAZ) 4 MG TB24 tablet Take 1 tablet (4 mg total) by mouth daily. Patient not taking: Reported on 01/22/2017 12/30/16   Biagio Borg, MD    ALLERGY: Allergies  Allergen Reactions  . Codeine Nausea Only and Other (See Comments)    Can take low doses  . Sulfonamide Derivatives Nausea Only  . Ambien [Zolpidem Tartrate] Anxiety  . Penicillins Rash and Other (See Comments)    .Marland KitchenHas patient had a PCN reaction causing immediate rash, facial/tongue/throat swelling, SOB or lightheadedness with hypotension: No Has patient had a PCN reaction causing severe rash involving mucus membranes or skin necrosis: No Has patient had a PCN reaction that required hospitalization No Has patient had a PCN reaction occurring within the last 10 years: No If all of the above answers are "NO", then may proceed with Cephalosporin use.   Marland Kitchen Percocet [Oxycodone-Acetaminophen] Itching    ROS: Unable to obtain due to sedation  ROS  NEUROLOGIC EXAM: In aspen collar Awake and alert Not communicative with the exception grunts Moves all extremities Responds to pain PERRL Multiple scalp lacerations  IMAGING: IMPRESSION: Frontal scalp hematoma and laceration. No acute intracranial abnormality.  Type 2 odontoid process fracture, and fractures of the right anterior arch and left lamina of C1. Posterior displacement of odontoid process by approximately 8 mm, with posterior subluxation of C1 on C2.  IMPRESSION: - 76 y.o. female with cervical spine fractures  listed above. She is currently slightly sedated with recent fentanyl. Family reports she was at baseline. She appears to be neurologically intact.  PLAN: - No neurosurgical intervention at this time. Family states they are hesitant to even have NS intervention if it comes down to that based on mental status and age.  - Maintain Aspen collar - Will be admitted to [facility] for one night for monitoring. Will check in tomorrow - F/U NS 4 week

## 2017-01-22 NOTE — ED Triage Notes (Signed)
Pt to ER by [facility] from [place] after experiencing an unwitnessed fall from bed or wheelchair. Pt found on the floor awake, unknown LOC. Pt alert x3, does not remember the event however has hx of alzheimers. Pt being treated at [facility] for pneumonia, on arrival patient RR 25-30. Presents with deep laceration to superior head as well as lacerations to bilateral arms and abrasions to left hi. Pupils equal and reactive, not on anticoagulation. Able to answer questions but not following any commands, EMS reports same. VSS. Received 50 mcg of fentanyl in route for pain.

## 2017-01-22 NOTE — H&P (Signed)
History and Physical    Tara Neal [id number] DOB: 1941-05-05 DOA: 01/22/2017  PCP: [name], MD   Patient coming from: [facility]  Chief Complaint: fall with multiple lacerations  HPI: Tara Neal is a 76 y.o. female with medical history significant of dementia, COPD, anemia, anxiety, Parkinson's disease with recurrent falls, HTN, HLD, GERD, osteoarthritis, who presented to the ED after she sustained unwitnessed fall in the facility Patient is unable to recall the events and circumstances surrounding her fall and it is unclear whether she fell from the bed and or wheelchair She is essentially nonverbal at baseline and family provided the information on presentation Patient was hospitalized recently for PNA and failure to thrive and discharged on 01/10/2017 with plan for palliative care to follow her in the facility  ED Course: On presentation to the ED patient had stable VS, blood work revealed leukocytosis of 16,1000 with mild stable anemia,   Multiple Xrays of the hips, left elbow, arm and wrist did not reveal any acute findings,  Chest Xray showed slightly improved left basilar opacity compared to the previous study Head CT showed scalp hematoma and lacerations, no acute intracranial abnormality Cervical spineCT demonstrated type II odontoid process fracture with right anterior arch and left lamina C1 fractures, posterior displacement of odontoid process with posterior subluxation of C1 on C2 On presentation to the ED she had bleeding scalp and arms  lacerations bilaterally  Review of Systems: patient is unable to provide ROS d/t being nonverbal as a sequela of dementia, but is groaning and breathing heavily   Ambulatory Status: bedridden or in a wheelchair  Past Medical History:  Diagnosis Date  . Allergy   . Anemia    hx of   . Anxiety   . Chronic insomnia 04/22/2012  . COPD (chronic obstructive pulmonary disease) ([place])   . Dementia   . Diverticulosis of colon   . GERD  (gastroesophageal reflux disease)   . Glaucoma   . Hormone replacement therapy (postmenopausal)   . Hyperlipidemia   . Hypertension   . Low back pain   . Migraine headache    denies  . Mild obesity   . Osteoarthritis   . Osteopenia   . Osteoporosis   . Pneumonia    hx of 5 years ago   . Scoliosis   . Shortness of breath    with exertion   . Spontaneous pneumothorax    1968  . Umbilical hernia [id number]    Past Surgical History:  Procedure Laterality Date  . EXCISION/RELEASE BURSA HIP  04/26/2012   Procedure: EXCISION/RELEASE BURSA HIP;  Surgeon: Gearlean Alf, MD;  Location: [facility];  Service: Orthopedics;  Laterality: Right;  . EYE SURGERY     - bil cataract surg and Retina surgery to left eye  . HERNIA REPAIR  [id number]   umbilical hernia  . HIP ADDUCTOR TENOTOMY    . IR GENERIC HISTORICAL  06/17/2016   IR RADIOLOGIST EVAL & MGMT 06/17/2016 [facility]  . IR GENERIC HISTORICAL  07/01/2016   IR VERTEBROPLASTY LUMBAR BX INC UNI/BIL INC/INJECT/IMAGING 07/01/2016 Luanne Bras, MD [facility]  . IR GENERIC HISTORICAL  07/16/2016   IR RADIOLOGIST EVAL & MGMT 07/16/2016 [facility]  . IR GENERIC HISTORICAL  10/22/2016   IR VERTEBROPLASTY CERV/THOR BX INC UNI/BIL INC/INJECT/IMAGING 10/22/2016 Luanne Bras, MD [facility]  . IR GENERIC HISTORICAL  10/22/2016   IR VERTEBROPLASTY EA ADDL (T&LS) BX INC UNI/BIL INC INJECT/IMAGING 10/22/2016 Luanne Bras, MD [facility]  . LUMBAR DISC SURGERY      x 2  . OTHER SURGICAL HISTORY     benign growth removed from right under ear at age 29   . SHOULDER ARTHROSCOPY     left shoulder  . TONSILLECTOMY    . TONSILLECTOMY    . WRIST FRACTURE SURGERY     plate in left wrist/ fractured 2 times    Social History   Social History  . Marital status: Married    Spouse name: N/A  . Number of children: N/A  . Years of education: N/A   Occupational History  . Not on file.   Social History Main Topics  . Smoking  status: Former Smoker    Years: 20.00    Types: Cigarettes    Quit date: 11/29/1986  . Smokeless tobacco: Never Used  . Alcohol use No     Comment: one glass of wine every 3 months  . Drug use: No  . Sexual activity: No   Other Topics Concern  . 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 Codeine Nausea Only and Other (See Comments)    Can take low doses  . Sulfonamide Derivatives Nausea Only  . Ambien [Zolpidem Tartrate] Anxiety  . Penicillins Rash and Other (See Comments)    .Marland KitchenHas patient had a PCN reaction causing immediate rash, facial/tongue/throat swelling, SOB or lightheadedness with hypotension: No Has patient had a PCN reaction causing severe rash involving mucus membranes or skin necrosis: No Has patient had a PCN reaction that required hospitalization No Has patient had a PCN reaction occurring within the last 10 years: No If all of the above answers are "NO", then may proceed with Cephalosporin use.   Marland Kitchen Percocet [Oxycodone-Acetaminophen] Itching    Family History  Problem Relation Age of Onset  . Hyperlipidemia Mother   . Heart disease Mother   . Diabetes Mother     Prior to Admission medications   Medication Sig Start Date End Date Taking? Authorizing Provider  acetaminophen (TYLENOL) 325 MG tablet Take 2 tablets (650 mg total) by mouth every 6 (six) hours as needed for mild pain (or Fever >/= 101). 01/10/17  Yes Debbe Odea, MD  alendronate (FOSAMAX) 70 MG tablet Take 70 mg by mouth every Saturday.  11/24/16  Yes Historical Provider, MD  Ascorbic Acid (VITAMIN C) 1000 MG tablet Take 1,000 mg by mouth daily.   Yes Historical Provider, MD  benazepril (LOTENSIN) 40 MG tablet TAKE 1 TABLET EVERY DAY 03/15/16  Yes Biagio Borg, MD  calcium carbonate (OS-CAL) 600 MG TABS Take 600 mg by mouth 2 (two) times daily with a meal.   Yes Historical Provider, MD  carbidopa-levodopa (SINEMET IR) 25-100 MG tablet  Take 1 tablet by mouth 3 (three) times daily. 09/20/16  Yes Rebecca S Tat, DO  cetirizine (ZYRTEC) 10 MG tablet Take 10 mg by mouth daily.   Yes Historical Provider, MD  Cholecalciferol (VITAMIN D3) 1000 UNITS CAPS Take 1,000 Units by mouth daily.    Yes Historical Provider, MD  divalproex (DEPAKOTE) 125 MG DR tablet Take 125 mg by mouth 2 (two) times daily.   Yes Historical Provider, MD  famotidine (PEPCID) 20 MG tablet Take 1 tablet (20 mg total) by mouth 2 (two) times daily. 01/10/17  Yes Debbe Odea, MD  feeding supplement, ENSURE ENLIVE, (ENSURE ENLIVE) LIQD Take 237 mLs by mouth 2 (  two) times daily between meals. 01/10/17  Yes Debbe Odea, MD  folic acid (FOLVITE) 1 MG tablet Take 1 mg by mouth daily.   Yes Historical Provider, MD  gabapentin (NEURONTIN) 300 MG capsule Take 300 mg by mouth 3 (three) times daily.   Yes Historical Provider, MD  guaiFENesin (MUCINEX) 600 MG 12 hr tablet Take by mouth 2 (two) times daily as needed.    Yes Historical Provider, MD  latanoprost (XALATAN) 0.005 % ophthalmic solution Place 1 drop into both eyes at bedtime.  10/09/13  Yes Historical Provider, MD  meloxicam (MOBIC) 15 MG tablet Take 15 mg by mouth daily.  07/19/16  Yes Historical Provider, MD  methotrexate 250 MG/10ML injection Inject 1 mL (25 mg total) into the skin once a week. Patient taking differently: Inject 25 mg into the skin every Wednesday.  05/19/16  Yes Velvet Bathe, MD  Multiple Vitamin (TAB-A-VITE PO) Take 1 tablet by mouth daily.   Yes Historical Provider, MD  Omega-3 Fatty Acids (SEA-OMEGA 30) 1200 MG CAPS Take 2,400 mg by mouth daily.   Yes Historical Provider, MD  polyethylene glycol (MIRALAX / GLYCOLAX) packet Take 17 g by mouth daily.    Yes Historical Provider, MD  senna-docusate (SENEXON-S) 8.6-50 MG tablet Take 1 tablet by mouth daily.   Yes Historical Provider, MD  simvastatin (ZOCOR) 40 MG tablet TAKE 1 TABLET EVERY DAY  AT  6PM 03/15/16  Yes Biagio Borg, MD  timolol (TIMOPTIC)  0.5 % ophthalmic solution Place 1 drop into both eyes 2 (two) times daily.    Yes Historical Provider, MD  tolterodine (DETROL LA) 4 MG 24 hr capsule Take 4 mg by mouth daily.   Yes Historical Provider, MD  venlafaxine XR (EFFEXOR-XR) 150 MG 24 hr capsule TAKE 1 CAPSULE EVERY DAY WITH BREAKFAST 11/18/16  Yes Biagio Borg, MD  azithromycin (ZITHROMAX) 250 MG tablet Take 1 tablet (250 mg total) by mouth daily. Patient not taking: Reported on 01/22/2017 01/10/17   Debbe Odea, MD  cefdinir (OMNICEF) 300 MG capsule Take 1 capsule (300 mg total) by mouth 2 (two) times daily. Patient not taking: Reported on 01/22/2017 01/10/17   Debbe Odea, MD  denosumab (PROLIA) 60 MG/ML SOLN injection Inject 60 mg into the skin every 6 (six) months. Administer in upper arm, thigh, or abdomen    Historical Provider, MD  fesoterodine (TOVIAZ) 4 MG TB24 tablet Take 1 tablet (4 mg total) by mouth daily. Patient not taking: Reported on 01/22/2017 12/30/16   Biagio Borg, MD    Physical Exam: Vitals:   01/22/17 1045 01/22/17 1100 01/22/17 1230 01/22/17 1330  BP: 128/84 132/79 127/87 129/88  Pulse: 84 81 89 75  Resp:   19 18  Temp:      TempSrc:      SpO2: 96% 96% 98% 96%     General: Appears calm and comfortable Eyes: PERRLA, EOMI, normal lids, iris ENT:  grossly normal hearing, lips & tongue, mucous membranes moist and intact Neck: no lymphoadenopathy, masses or thyromegaly Cardiovascular: RRR, no m/r/g. No JVD, carotid bruits. No LE edema.  Respiratory: bilateral no wheezes, rales, rhonchi or cracles. Normal respiratory effort. No accessory muscle use observed Abdomen: soft, non-tender, non-distended, no organomegaly or masses appreciated. BS present in all quadrants Skin: no rash, ulcers or induration seen on limited exam Musculoskeletal: grossly normal tone BUE/BLE, good ROM, no bony abnormality or joint deformities observed Psychiatric: grossly normal mood and affect, speech fluent and appropriate, alert and  oriented  x3 Neurologic: CN II-XII grossly intact, moves all extremities in coordinated fashion, sensation intact  Labs on Admission: I have personally reviewed following labs and imaging studies  CBC, BMP  GFR: CrCl cannot be calculated (Unknown ideal weight.).   Creatinine Clearance: CrCl cannot be calculated (Unknown ideal weight.).    Radiological Exams on Admission: Dg Chest 1 View  Result Date: 01/22/2017 CLINICAL DATA:  Fall on floor.  Recent pneumonia. EXAM: CHEST 1 VIEW COMPARISON:  01/07/2017 and prior radiographs FINDINGS: The patient is rotated. Upper limits normal heart size noted. Left basilar opacity has slightly improved as compared to the prior study. No new airspace disease, pleural effusion or pneumothorax identified. Vertebral augmentation changes within the lower thoracic spine again noted. IMPRESSION: No acute abnormality. Slightly improved left basilar opacity/airspace disease since the prior study. Electronically Signed   By: Margarette Canada M.D.   On: 01/22/2017 10:16   Dg Elbow Complete Right  Result Date: 01/22/2017 CLINICAL DATA:  Acute right elbow pain following fall. Initial encounter. EXAM: RIGHT ELBOW - COMPLETE 3+ VIEW COMPARISON:  None. FINDINGS: There is no evidence of acute fracture, subluxation or dislocation. There is no joint effusion identified. Mild degenerative changes are present. IMPRESSION: No acute abnormality. Electronically Signed   By: Margarette Canada M.D.   On: 01/22/2017 10:15   Dg Forearm Left  Result Date: 01/22/2017 CLINICAL DATA:  Left forearm pain following fall. Initial encounter. EXAM: LEFT FOREARM - 2 VIEW COMPARISON:  02/16/2014 radiographs. FINDINGS: No acute fracture, subluxation or dislocation identified. Plate and screw fixation of the distal radius again noted. No focal bony lesions are present. IMPRESSION: No acute bony abnormality. Electronically Signed   By: Margarette Canada M.D.   On: 01/22/2017 10:13   Ct Head Wo Contrast  Result  Date: 01/22/2017 CLINICAL DATA:  Unwitnessed fall. Scalp laceration. Hypertension. Dementia. EXAM: CT HEAD WITHOUT CONTRAST CT CERVICAL SPINE WITHOUT CONTRAST TECHNIQUE: Multidetector CT imaging of the head and cervical spine was performed following the standard protocol without intravenous contrast. Multiplanar CT image reconstructions of the cervical spine were also generated. COMPARISON:  01/07/2017 FINDINGS: CT HEAD FINDINGS Brain: No evidence of acute infarction, hemorrhage, hydrocephalus, extra-axial collection or mass lesion/mass effect. Mild chronic small vessel disease again noted. Vascular: No hyperdense vessel or unexpected calcification. Skull: Mild frontal scalp hematoma and soft tissue laceration noted. No evidence of skull fracture. Sinuses/Orbits: No acute finding. Other: None. CT CERVICAL SPINE FINDINGS Skull base and vertebrae: Fractures are seen involving the right anterior arch and left lamina of C1. Type 2 odontoid process fracture is seen with posterior displacement of the odontoid by approximately 8 mm, and posterior subluxation of C1 on C2. Motion artifact is noted, but no other definite acute cervical spine fractures are identified. Soft tissues and spinal canal: Moderate narrowing of the upper cervical spinal canal at C1-2. Disc levels: Moderate to severe degenerative disc disease and facet DJD from levels of C3 to C7. Associated cervical kyphosis. Mild degenerative retrolisthesis at C5-6 measuring 3 mm. Upper chest: Biapical pleural- parenchymal scarring. Other: None IMPRESSION: Frontal scalp hematoma and laceration. No acute intracranial abnormality. Type 2 odontoid process fracture, and fractures of the right anterior arch and left lamina of C1. Posterior displacement of odontoid process by approximately 8 mm, with posterior subluxation of C1 on C2. Critical Value/emergent results were called by telephone at the time of interpretation on 01/22/2017 at 10:01 am to Dr. Leo Grosser, who  verbally acknowledged these results. Electronically Signed   By: [name]  Kris Hartmann M.D.   On: 01/22/2017 10:06   Ct Cervical Spine Wo Contrast  Result Date: 01/22/2017 CLINICAL DATA:  Unwitnessed fall. Scalp laceration. Hypertension. Dementia. EXAM: CT HEAD WITHOUT CONTRAST CT CERVICAL SPINE WITHOUT CONTRAST TECHNIQUE: Multidetector CT imaging of the head and cervical spine was performed following the standard protocol without intravenous contrast. Multiplanar CT image reconstructions of the cervical spine were also generated. COMPARISON:  01/07/2017 FINDINGS: CT HEAD FINDINGS Brain: No evidence of acute infarction, hemorrhage, hydrocephalus, extra-axial collection or mass lesion/mass effect. Mild chronic small vessel disease again noted. Vascular: No hyperdense vessel or unexpected calcification. Skull: Mild frontal scalp hematoma and soft tissue laceration noted. No evidence of skull fracture. Sinuses/Orbits: No acute finding. Other: None. CT CERVICAL SPINE FINDINGS Skull base and vertebrae: Fractures are seen involving the right anterior arch and left lamina of C1. Type 2 odontoid process fracture is seen with posterior displacement of the odontoid by approximately 8 mm, and posterior subluxation of C1 on C2. Motion artifact is noted, but no other definite acute cervical spine fractures are identified. Soft tissues and spinal canal: Moderate narrowing of the upper cervical spinal canal at C1-2. Disc levels: Moderate to severe degenerative disc disease and facet DJD from levels of C3 to C7. Associated cervical kyphosis. Mild degenerative retrolisthesis at C5-6 measuring 3 mm. Upper chest: Biapical pleural- parenchymal scarring. Other: None IMPRESSION: Frontal scalp hematoma and laceration. No acute intracranial abnormality. Type 2 odontoid process fracture, and fractures of the right anterior arch and left lamina of C1. Posterior displacement of odontoid process by approximately 8 mm, with posterior subluxation of  C1 on C2. Critical Value/emergent results were called by telephone at the time of interpretation on 01/22/2017 at 10:01 am to Dr. Leo Grosser, who verbally acknowledged these results. Electronically Signed   By: Earle Gell M.D.   On: 01/22/2017 10:06   Dg Hand Complete Right  Result Date: 01/22/2017 CLINICAL DATA:  Patient found down today. EXAM: RIGHT HAND - COMPLETE 3+ VIEW COMPARISON:  None. FINDINGS: No acute bony or joint abnormality is identified. Advanced first [place] osteoarthritis is seen. Chondrocalcinosis is noted. IMPRESSION: No acute abnormality. Advanced first [place] osteoarthritis. Electronically Signed   By: Inge Rise M.D.   On: 01/22/2017 10:12   Dg Hip Unilat W Or Wo Pelvis 2-3 Views Left  Result Date: 01/22/2017 CLINICAL DATA:  Unwitnessed fall, found on floor. Left hip abrasions. EXAM: DG HIP (WITH OR WITHOUT PELVIS) 2-3V LEFT COMPARISON:  05/04/2016. FINDINGS: Oral contrast is seen in the colon, obscuring the majority of the sacrum. No fracture dislocation. Mild subchondral sclerosis and collar osteophytosis in the left hip. Degenerative and postoperative changes, as well as scoliosis, in the spine. IMPRESSION: 1. No acute findings. 2. Mild left hip osteoarthritis. Electronically Signed   By: Lorin Picket M.D.   On: 01/22/2017 10:13    EKG: not found  Assessment/Plan Principal Problem:   Odontoid fracture with type II morphology ([facility]) Active Problems:   Hyperlipidemia   Essential hypertension   COPD (chronic obstructive pulmonary disease) ([place])   GERD   Fall   Depression   Dementia without behavioral disturbance   Parkinson's disease ([place])   Laceration of scalp    Odontoid fracture type II Patient has been seen by neurosurgery and is deemed to be a poor surgical candidate Currently she in [place] collar Will be admitted for pain control and continue neuro checks Neurosurgery is going ot follow patient through the admission  Multiple skin lacerations post fall  Patient underwent scalp lacerations repair Keep wound area clean and dry Minute or for hemostasis and early signs of infection  Leukocytosis Chest x-ray showed improvement of the left-sided opacity compared to the previous imaging Etiology is unclear, patient is afebrile and physical exam didn't reveal any findings to suggest acute infectious process Could be reactive and associated with stress of fall and trauma, continue to monitor, will obtain UA check pro-calcitonin  Parkinson;s disease with falls Monitor for safety Continue Sinemet  Dementia Patient is nonverbal and cannot communicate needs  Provide supportive care, monitor for safety  HTN BP is controlled, continue ARB, monitor blood pressure readings and adjust the dose as needed  DVT prophylaxis: SCD and TED Code Status: DNR Family Communication: at bedside Disposition Plan: telemetry Consults called: neurosurgery by EDP Admission status: observation  York Grice, PA-C Pager: [phone] [facility]  If 7PM-7AM, please contact night-coverage [email or web address] Password [email or web address]  01/22/2017, 1:49 PM

## 2017-01-22 NOTE — Progress Notes (Signed)
Pt VSS; telemetry applied and verified with CCMD and second RN called to second verify. Delia Heady RN

## 2017-01-22 NOTE — ED Provider Notes (Signed)
[facility] DEPT Provider Note   CSN: [id number] Arrival date & time: 01/22/17  [id number]     History   Chief Complaint Chief Complaint  Patient presents with  . Fall    HPI Tara Neal is a 76 y.o. female.  The history is provided by the patient. No language interpreter was used.  Fall  This is a new problem. The current episode started 1 to 2 hours ago. The problem occurs constantly. The problem has not changed since onset.Pertinent negatives include no chest pain. Nothing aggravates the symptoms. Nothing relieves the symptoms. She has tried nothing for the symptoms.   Pt is a resident at [facility].  Pt had unwitnessed fall.  Pt has large laceration to her head.   Past Medical History:  Diagnosis Date  . Allergy   . Anemia    hx of   . Anxiety   . Chronic insomnia 04/22/2012  . COPD (chronic obstructive pulmonary disease) ([place])   . Dementia   . Diverticulosis of colon   . GERD (gastroesophageal reflux disease)   . Glaucoma   . Hormone replacement therapy (postmenopausal)   . Hyperlipidemia   . Hypertension   . Low back pain   . Migraine headache    denies  . Mild obesity   . Osteoarthritis   . Osteopenia   . Osteoporosis   . Pneumonia    hx of 5 years ago   . Scoliosis   . Shortness of breath    with exertion   . Spontaneous pneumothorax    1968  . Umbilical hernia [id number]    Patient Active Problem List   Diagnosis Date Noted  . Community acquired pneumonia   . Encounter for palliative care   . Goals of care, counseling/discussion   . Adult failure to thrive 01/09/2017  . CAP (community acquired pneumonia) 01/07/2017  . Suicidal ideation 01/07/2017  . Rheumatoid arthritis ([place]) 01/07/2017  . OAB (overactive bladder) 12/31/2016  . 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 Depression 11/20/2016  . Dementia without behavioral disturbance 11/20/2016  . Dehydration 10/02/2016  . Thoracic compression fracture ([place])  09/30/2016  . Acute diarrhea 09/30/2016  . Acute cystitis without hematuria   . Fall   . Urinary retention 07/08/2016  . UTI (lower urinary tract infection) 05/15/2016  . Bilateral hand pain 06/05/2015  . Gait disorder 04/15/2015  . Orthostatic hypotension 04/10/2015  . Recurrent falls while walking 04/10/2015  . Memory loss 04/10/2015  . Abnormal x-ray 02/20/2015  . Cough 11/18/2014  . Thrush, oral 04/04/2014  . Umbilical hernia 0000000  . Benign skin lesion 07/24/2013  . Anemia, unspecified 12/21/2012  . Insomnia 04/22/2012  . Right hip pain 12/13/2011  . Right knee pain 12/13/2011  . Preventative health care 12/11/2011  . DIVERTICULOSIS, COLON 11/18/2010  . ARTERIOVENOUS MALFORMATION, COLON 11/18/2010  . ANXIETY 12/02/2009  . COPD (chronic obstructive pulmonary disease) ([place]) 05/27/2009  . Diabetes ([place]) 02/06/2009  . FATIGUE 12/12/2008  . Hyperlipidemia 10/01/2007  . OBESITY, MILD 10/01/2007  . MIGRAINE HEADACHE 10/01/2007  . Essential hypertension 10/01/2007  . 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 Pneumothorax 10/01/2007  . GERD 10/01/2007  . Osteoarthritis 10/01/2007  . LOW BACK PAIN 10/01/2007  . OSTEOPOROSIS 10/01/2007    Past Surgical History:  Procedure Laterality Date  . EXCISION/RELEASE BURSA HIP  04/26/2012   Procedure: EXCISION/RELEASE BURSA HIP;  Surgeon: Gearlean Alf, MD;  Location: [facility];  Service: Orthopedics;  Laterality: Right;  .  EYE SURGERY     - bil cataract surg and Retina surgery to left eye  . HERNIA REPAIR  [id number]   umbilical hernia  . HIP ADDUCTOR TENOTOMY    . IR GENERIC HISTORICAL  06/17/2016   IR RADIOLOGIST EVAL & MGMT 06/17/2016 [facility]  . IR GENERIC HISTORICAL  07/01/2016   IR VERTEBROPLASTY LUMBAR BX INC UNI/BIL INC/INJECT/IMAGING 07/01/2016 Luanne Bras, MD [facility]  . IR GENERIC HISTORICAL  07/16/2016   IR RADIOLOGIST EVAL & MGMT 07/16/2016 [facility]  . IR GENERIC HISTORICAL  10/22/2016    IR VERTEBROPLASTY CERV/THOR BX INC UNI/BIL INC/INJECT/IMAGING 10/22/2016 Luanne Bras, MD [facility]  . IR GENERIC HISTORICAL  10/22/2016   IR VERTEBROPLASTY EA ADDL (T&LS) BX INC UNI/BIL INC INJECT/IMAGING 10/22/2016 Luanne Bras, MD [facility]  . LUMBAR DISC SURGERY      x 2  . OTHER SURGICAL HISTORY     benign growth removed from right under ear at age 11   . SHOULDER ARTHROSCOPY     left shoulder  . TONSILLECTOMY    . TONSILLECTOMY    . WRIST FRACTURE SURGERY     plate in left wrist/ fractured 2 times    OB History    No data available       Home Medications    Prior to Admission medications   Medication Sig Start Date End Date Taking? Authorizing Provider  acetaminophen (TYLENOL) 325 MG tablet Take 2 tablets (650 mg total) by mouth every 6 (six) hours as needed for mild pain (or Fever >/= 101). 01/10/17  Yes Debbe Odea, MD  alendronate (FOSAMAX) 70 MG tablet Take 70 mg by mouth every Saturday.  11/24/16  Yes Historical Provider, MD  Ascorbic Acid (VITAMIN C) 1000 MG tablet Take 1,000 mg by mouth daily.   Yes Historical Provider, MD  benazepril (LOTENSIN) 40 MG tablet TAKE 1 TABLET EVERY DAY 03/15/16  Yes Biagio Borg, MD  calcium carbonate (OS-CAL) 600 MG TABS Take 600 mg by mouth 2 (two) times daily with a meal.   Yes Historical Provider, MD  carbidopa-levodopa (SINEMET IR) 25-100 MG tablet Take 1 tablet by mouth 3 (three) times daily. 09/20/16  Yes Rebecca S Tat, DO  cetirizine (ZYRTEC) 10 MG tablet Take 10 mg by mouth daily.   Yes Historical Provider, MD  Cholecalciferol (VITAMIN D3) 1000 UNITS CAPS Take 1,000 Units by mouth daily.    Yes Historical Provider, MD  divalproex (DEPAKOTE) 125 MG DR tablet Take 125 mg by mouth 2 (two) times daily.   Yes Historical Provider, MD  famotidine (PEPCID) 20 MG tablet Take 1 tablet (20 mg total) by mouth 2 (two) times daily. 01/10/17  Yes Debbe Odea, MD  feeding supplement, ENSURE ENLIVE, (ENSURE ENLIVE) LIQD Take  237 mLs by mouth 2 (two) times daily between meals. 01/10/17  Yes Debbe Odea, MD  folic acid (FOLVITE) 1 MG tablet Take 1 mg by mouth daily.   Yes Historical Provider, MD  gabapentin (NEURONTIN) 300 MG capsule Take 300 mg by mouth 3 (three) times daily.   Yes Historical Provider, MD  guaiFENesin (MUCINEX) 600 MG 12 hr tablet Take by mouth 2 (two) times daily as needed.    Yes Historical Provider, MD  latanoprost (XALATAN) 0.005 % ophthalmic solution Place 1 drop into both eyes at bedtime.  10/09/13  Yes Historical Provider, MD  meloxicam (MOBIC) 15 MG tablet Take 15 mg by mouth daily.  07/19/16  Yes Historical Provider, MD  methotrexate 250 MG/10ML  injection Inject 1 mL (25 mg total) into the skin once a week. Patient taking differently: Inject 25 mg into the skin every Wednesday.  05/19/16  Yes Velvet Bathe, MD  Multiple Vitamin (TAB-A-VITE PO) Take 1 tablet by mouth daily.   Yes Historical Provider, MD  Omega-3 Fatty Acids (SEA-OMEGA 30) 1200 MG CAPS Take 2,400 mg by mouth daily.   Yes Historical Provider, MD  polyethylene glycol (MIRALAX / GLYCOLAX) packet Take 17 g by mouth daily.    Yes Historical Provider, MD  senna-docusate (SENEXON-S) 8.6-50 MG tablet Take 1 tablet by mouth daily.   Yes Historical Provider, MD  simvastatin (ZOCOR) 40 MG tablet TAKE 1 TABLET EVERY DAY  AT  6PM 03/15/16  Yes Biagio Borg, MD  timolol (TIMOPTIC) 0.5 % ophthalmic solution Place 1 drop into both eyes 2 (two) times daily.    Yes Historical Provider, MD  tolterodine (DETROL LA) 4 MG 24 hr capsule Take 4 mg by mouth daily.   Yes Historical Provider, MD  venlafaxine XR (EFFEXOR-XR) 150 MG 24 hr capsule TAKE 1 CAPSULE EVERY DAY WITH BREAKFAST 11/18/16  Yes Biagio Borg, MD  azithromycin (ZITHROMAX) 250 MG tablet Take 1 tablet (250 mg total) by mouth daily. Patient not taking: Reported on 01/22/2017 01/10/17   Debbe Odea, MD  cefdinir (OMNICEF) 300 MG capsule Take 1 capsule (300 mg total) by mouth 2 (two) times  daily. Patient not taking: Reported on 01/22/2017 01/10/17   Debbe Odea, MD  denosumab (PROLIA) 60 MG/ML SOLN injection Inject 60 mg into the skin every 6 (six) months. Administer in upper arm, thigh, or abdomen    Historical Provider, MD  fesoterodine (TOVIAZ) 4 MG TB24 tablet Take 1 tablet (4 mg total) by mouth daily. Patient not taking: Reported on 01/22/2017 12/30/16   Biagio Borg, MD    Family History Family History  Problem Relation Age of Onset  . Hyperlipidemia Mother   . Heart disease Mother   . Diabetes Mother     Social History Social History  Substance Use Topics  . Smoking status: Former Smoker    Years: 20.00    Types: Cigarettes    Quit date: 11/29/1986  . Smokeless tobacco: Never Used  . Alcohol use No     Comment: one glass of wine every 3 months     Allergies   Codeine; Sulfonamide derivatives; Ambien [zolpidem tartrate]; Penicillins; and Percocet [oxycodone-acetaminophen]   Review of Systems Review of Systems  Cardiovascular: Negative for chest pain.  All other systems reviewed and are negative.    Physical Exam Updated Vital Signs BP 119/75   Pulse 81   Temp 98.4 F (36.9 C) (Rectal)   Resp 19   SpO2 97%   Physical Exam  Constitutional: She appears well-developed and well-nourished.  HENT:  Head: Normocephalic.  Right Ear: External ear normal.  Left Ear: External ear normal.  Eyes: EOM are normal. Pupils are equal, round, and reactive to light.  Neck: Normal range of motion.  Cardiovascular: Normal rate and regular rhythm.   Pulmonary/Chest: Effort normal.  Abdominal: Soft.  Musculoskeletal: Normal range of motion.  Neurological: She is alert.  Skin: Skin is warm.  Multiple superficial skin tears arms,  Bruising left outer thigh  Psychiatric: She has a normal mood and affect.  Nursing note and vitals reviewed.    ED Treatments / Results  Labs (all labs ordered are listed, but only abnormal results are displayed) Labs Reviewed - No  data to display  EKG  EKG Interpretation None       Radiology Dg Chest 1 View  Result Date: 01/22/2017 CLINICAL DATA:  Fall on floor.  Recent pneumonia. EXAM: CHEST 1 VIEW COMPARISON:  01/07/2017 and prior radiographs FINDINGS: The patient is rotated. Upper limits normal heart size noted. Left basilar opacity has slightly improved as compared to the prior study. No new airspace disease, pleural effusion or pneumothorax identified. Vertebral augmentation changes within the lower thoracic spine again noted. IMPRESSION: No acute abnormality. Slightly improved left basilar opacity/airspace disease since the prior study. Electronically Signed   By: Margarette Canada M.D.   On: 01/22/2017 10:16   Dg Elbow Complete Right  Result Date: 01/22/2017 CLINICAL DATA:  Acute right elbow pain following fall. Initial encounter. EXAM: RIGHT ELBOW - COMPLETE 3+ VIEW COMPARISON:  None. FINDINGS: There is no evidence of acute fracture, subluxation or dislocation. There is no joint effusion identified. Mild degenerative changes are present. IMPRESSION: No acute abnormality. Electronically Signed   By: Margarette Canada M.D.   On: 01/22/2017 10:15   Dg Forearm Left  Result Date: 01/22/2017 CLINICAL DATA:  Left forearm pain following fall. Initial encounter. EXAM: LEFT FOREARM - 2 VIEW COMPARISON:  02/16/2014 radiographs. FINDINGS: No acute fracture, subluxation or dislocation identified. Plate and screw fixation of the distal radius again noted. No focal bony lesions are present. IMPRESSION: No acute bony abnormality. Electronically Signed   By: Margarette Canada M.D.   On: 01/22/2017 10:13   Ct Head Wo Contrast  Result Date: 01/22/2017 CLINICAL DATA:  Unwitnessed fall. Scalp laceration. Hypertension. Dementia. EXAM: CT HEAD WITHOUT CONTRAST CT CERVICAL SPINE WITHOUT CONTRAST TECHNIQUE: Multidetector CT imaging of the head and cervical spine was performed following the standard protocol without intravenous contrast. Multiplanar CT  image reconstructions of the cervical spine were also generated. COMPARISON:  01/07/2017 FINDINGS: CT HEAD FINDINGS Brain: No evidence of acute infarction, hemorrhage, hydrocephalus, extra-axial collection or mass lesion/mass effect. Mild chronic small vessel disease again noted. Vascular: No hyperdense vessel or unexpected calcification. Skull: Mild frontal scalp hematoma and soft tissue laceration noted. No evidence of skull fracture. Sinuses/Orbits: No acute finding. Other: None. CT CERVICAL SPINE FINDINGS Skull base and vertebrae: Fractures are seen involving the right anterior arch and left lamina of C1. Type 2 odontoid process fracture is seen with posterior displacement of the odontoid by approximately 8 mm, and posterior subluxation of C1 on C2. Motion artifact is noted, but no other definite acute cervical spine fractures are identified. Soft tissues and spinal canal: Moderate narrowing of the upper cervical spinal canal at C1-2. Disc levels: Moderate to severe degenerative disc disease and facet DJD from levels of C3 to C7. Associated cervical kyphosis. Mild degenerative retrolisthesis at C5-6 measuring 3 mm. Upper chest: Biapical pleural- parenchymal scarring. Other: None IMPRESSION: Frontal scalp hematoma and laceration. No acute intracranial abnormality. Type 2 odontoid process fracture, and fractures of the right anterior arch and left lamina of C1. Posterior displacement of odontoid process by approximately 8 mm, with posterior subluxation of C1 on C2. Critical Value/emergent results were called by telephone at the time of interpretation on 01/22/2017 at 10:01 am to Dr. Leo Grosser, who verbally acknowledged these results. Electronically Signed   By: Earle Gell M.D.   On: 01/22/2017 10:06   Ct Cervical Spine Wo Contrast  Result Date: 01/22/2017 CLINICAL DATA:  Unwitnessed fall. Scalp laceration. Hypertension. Dementia. EXAM: CT HEAD WITHOUT CONTRAST CT CERVICAL SPINE WITHOUT CONTRAST TECHNIQUE:  Multidetector CT imaging of the head and  cervical spine was performed following the standard protocol without intravenous contrast. Multiplanar CT image reconstructions of the cervical spine were also generated. COMPARISON:  01/07/2017 FINDINGS: CT HEAD FINDINGS Brain: No evidence of acute infarction, hemorrhage, hydrocephalus, extra-axial collection or mass lesion/mass effect. Mild chronic small vessel disease again noted. Vascular: No hyperdense vessel or unexpected calcification. Skull: Mild frontal scalp hematoma and soft tissue laceration noted. No evidence of skull fracture. Sinuses/Orbits: No acute finding. Other: None. CT CERVICAL SPINE FINDINGS Skull base and vertebrae: Fractures are seen involving the right anterior arch and left lamina of C1. Type 2 odontoid process fracture is seen with posterior displacement of the odontoid by approximately 8 mm, and posterior subluxation of C1 on C2. Motion artifact is noted, but no other definite acute cervical spine fractures are identified. Soft tissues and spinal canal: Moderate narrowing of the upper cervical spinal canal at C1-2. Disc levels: Moderate to severe degenerative disc disease and facet DJD from levels of C3 to C7. Associated cervical kyphosis. Mild degenerative retrolisthesis at C5-6 measuring 3 mm. Upper chest: Biapical pleural- parenchymal scarring. Other: None IMPRESSION: Frontal scalp hematoma and laceration. No acute intracranial abnormality. Type 2 odontoid process fracture, and fractures of the right anterior arch and left lamina of C1. Posterior displacement of odontoid process by approximately 8 mm, with posterior subluxation of C1 on C2. Critical Value/emergent results were called by telephone at the time of interpretation on 01/22/2017 at 10:01 am to Dr. Leo Grosser, who verbally acknowledged these results. Electronically Signed   By: Earle Gell M.D.   On: 01/22/2017 10:06   Dg Hand Complete Right  Result Date: 01/22/2017 CLINICAL DATA:   Patient found down today. EXAM: RIGHT HAND - COMPLETE 3+ VIEW COMPARISON:  None. FINDINGS: No acute bony or joint abnormality is identified. Advanced first [place] osteoarthritis is seen. Chondrocalcinosis is noted. IMPRESSION: No acute abnormality. Advanced first [place] osteoarthritis. Electronically Signed   By: Inge Rise M.D.   On: 01/22/2017 10:12   Dg Hip Unilat W Or Wo Pelvis 2-3 Views Left  Result Date: 01/22/2017 CLINICAL DATA:  Unwitnessed fall, found on floor. Left hip abrasions. EXAM: DG HIP (WITH OR WITHOUT PELVIS) 2-3V LEFT COMPARISON:  05/04/2016. FINDINGS: Oral contrast is seen in the colon, obscuring the majority of the sacrum. No fracture dislocation. Mild subchondral sclerosis and collar osteophytosis in the left hip. Degenerative and postoperative changes, as well as scoliosis, in the spine. IMPRESSION: 1. No acute findings. 2. Mild left hip osteoarthritis. Electronically Signed   By: Lorin Picket M.D.   On: 01/22/2017 10:13    Procedures .Marland KitchenLaceration Repair Date/Time: 01/22/2017 12:19 PM Performed by: Fransico Meadow Authorized by: Fransico Meadow   Consent:    Consent obtained:  Verbal   Consent given by:  Guardian   Risks discussed:  Infection   Alternatives discussed:  No treatment Anesthesia (see MAR for exact dosages):    Anesthesia method:  None Laceration details:    Location:  Face   Length (cm):  16   Depth (mm):  4 Repair type:    Repair type:  Complex Pre-procedure details:    Preparation:  Patient was prepped and draped in usual sterile fashion Exploration:    Wound extent comment:  To bone Treatment:    Area cleansed with:  Betadine   Amount of cleaning:  Extensive   Irrigation solution:  Sterile saline   Visualized foreign bodies/material removed: no     Debridement:  None   Undermining:  None  Scar revision: no   Skin repair:    Repair method:  Sutures   Suture size:  5-0   Suture material:  Prolene   Suture technique:  Running locked    Number of sutures:  24 Approximation:    [place] border: well-aligned   Post-procedure details:    Dressing:  Open (no dressing) Comments:     4 sutures to gala and to close stellate laceration  Running suture to 5cm straight laceration  12 sutures interupted to 10 cm stellate laceration    (including critical care time)  Medications Ordered in ED Medications  lidocaine (XYLOCAINE) 2 % (with pres) injection 300 mg (300 mg Infiltration Given 01/22/17 1041)     Initial Impression / Assessment and Plan / ED Course  I have reviewed the triage vital signs and the nursing notes.  Pertinent labs & imaging results that were available during my care of the patient were reviewed by me and considered in my medical decision making (see chart for details).     Pt placed in an aspen collar.  Dr. Laneta Simmers in to see and examine.  Consult to Dr. Cyndy Freeze.   Request medicine to admit for neuro checks and they will follow.  Pt placed in an aspen collar.   Final Clinical Impressions(s) / ED Diagnoses   Final diagnoses:  Closed displaced fracture of first cervical vertebra, unspecified fracture morphology, initial encounter [facility])    New Prescriptions New Prescriptions   No medications on file     Fransico Meadow, PA-C 01/22/17 1223    Leo Grosser, MD 01/24/17 [phone]

## 2017-01-23 ENCOUNTER — Observation Stay (HOSPITAL_COMMUNITY): Payer: Medicare Other

## 2017-01-23 DIAGNOSIS — R627 Adult failure to thrive: Secondary | ICD-10-CM | POA: Diagnosis present

## 2017-01-23 DIAGNOSIS — S0101XA Laceration without foreign body of scalp, initial encounter: Secondary | ICD-10-CM | POA: Diagnosis present

## 2017-01-23 DIAGNOSIS — S12111A Posterior displaced Type II dens fracture, initial encounter for closed fracture: Secondary | ICD-10-CM | POA: Diagnosis not present

## 2017-01-23 DIAGNOSIS — F339 Major depressive disorder, recurrent, unspecified: Secondary | ICD-10-CM | POA: Diagnosis not present

## 2017-01-23 DIAGNOSIS — R630 Anorexia: Secondary | ICD-10-CM | POA: Diagnosis not present

## 2017-01-23 DIAGNOSIS — Z87891 Personal history of nicotine dependence: Secondary | ICD-10-CM | POA: Diagnosis not present

## 2017-01-23 DIAGNOSIS — Z833 Family history of diabetes mellitus: Secondary | ICD-10-CM | POA: Diagnosis not present

## 2017-01-23 DIAGNOSIS — W19XXXD Unspecified fall, subsequent encounter: Secondary | ICD-10-CM | POA: Diagnosis not present

## 2017-01-23 DIAGNOSIS — F419 Anxiety disorder, unspecified: Secondary | ICD-10-CM | POA: Diagnosis present

## 2017-01-23 DIAGNOSIS — Z66 Do not resuscitate: Secondary | ICD-10-CM | POA: Diagnosis present

## 2017-01-23 DIAGNOSIS — F329 Major depressive disorder, single episode, unspecified: Secondary | ICD-10-CM | POA: Diagnosis present

## 2017-01-23 DIAGNOSIS — F0390 Unspecified dementia without behavioral disturbance: Secondary | ICD-10-CM | POA: Diagnosis not present

## 2017-01-23 DIAGNOSIS — F0280 Dementia in other diseases classified elsewhere without behavioral disturbance: Secondary | ICD-10-CM | POA: Diagnosis present

## 2017-01-23 DIAGNOSIS — E785 Hyperlipidemia, unspecified: Secondary | ICD-10-CM | POA: Diagnosis present

## 2017-01-23 DIAGNOSIS — H409 Unspecified glaucoma: Secondary | ICD-10-CM | POA: Diagnosis present

## 2017-01-23 DIAGNOSIS — G309 Alzheimer's disease, unspecified: Secondary | ICD-10-CM | POA: Diagnosis present

## 2017-01-23 DIAGNOSIS — Z515 Encounter for palliative care: Secondary | ICD-10-CM | POA: Diagnosis not present

## 2017-01-23 DIAGNOSIS — Z7189 Other specified counseling: Secondary | ICD-10-CM | POA: Diagnosis not present

## 2017-01-23 DIAGNOSIS — J189 Pneumonia, unspecified organism: Secondary | ICD-10-CM | POA: Diagnosis not present

## 2017-01-23 DIAGNOSIS — W1830XA Fall on same level, unspecified, initial encounter: Secondary | ICD-10-CM | POA: Diagnosis present

## 2017-01-23 DIAGNOSIS — G20 Parkinson's disease: Secondary | ICD-10-CM | POA: Diagnosis present

## 2017-01-23 DIAGNOSIS — J449 Chronic obstructive pulmonary disease, unspecified: Secondary | ICD-10-CM | POA: Diagnosis present

## 2017-01-23 DIAGNOSIS — I10 Essential (primary) hypertension: Secondary | ICD-10-CM | POA: Diagnosis present

## 2017-01-23 DIAGNOSIS — K219 Gastro-esophageal reflux disease without esophagitis: Secondary | ICD-10-CM | POA: Diagnosis present

## 2017-01-23 DIAGNOSIS — M810 Age-related osteoporosis without current pathological fracture: Secondary | ICD-10-CM | POA: Diagnosis present

## 2017-01-23 DIAGNOSIS — M542 Cervicalgia: Secondary | ICD-10-CM | POA: Diagnosis not present

## 2017-01-23 DIAGNOSIS — S12110A Anterior displaced Type II dens fracture, initial encounter for closed fracture: Secondary | ICD-10-CM | POA: Diagnosis present

## 2017-01-23 DIAGNOSIS — S13120A Subluxation of C1/C2 cervical vertebrae, initial encounter: Secondary | ICD-10-CM | POA: Diagnosis not present

## 2017-01-23 DIAGNOSIS — Z79890 Hormone replacement therapy (postmenopausal): Secondary | ICD-10-CM | POA: Diagnosis not present

## 2017-01-23 DIAGNOSIS — S12000A Unspecified displaced fracture of first cervical vertebra, initial encounter for closed fracture: Secondary | ICD-10-CM | POA: Diagnosis present

## 2017-01-23 DIAGNOSIS — R0602 Shortness of breath: Secondary | ICD-10-CM | POA: Diagnosis not present

## 2017-01-23 DIAGNOSIS — Z8249 Family history of ischemic heart disease and other diseases of the circulatory system: Secondary | ICD-10-CM | POA: Diagnosis not present

## 2017-01-23 LAB — COMPREHENSIVE METABOLIC PANEL
ALT: 7 U/L — AB (ref 14–54)
AST: 34 U/L (ref 15–41)
Albumin: 3.1 g/dL — ABNORMAL LOW (ref 3.5–5.0)
Alkaline Phosphatase: 53 U/L (ref 38–126)
Anion gap: 6 (ref 5–15)
BILIRUBIN TOTAL: 0.5 mg/dL (ref 0.3–1.2)
BUN: 16 mg/dL (ref 6–20)
CO2: 22 mmol/L (ref 22–32)
CREATININE: 0.56 mg/dL (ref 0.44–1.00)
Calcium: 8.4 mg/dL — ABNORMAL LOW (ref 8.9–10.3)
Chloride: 108 mmol/L (ref 101–111)
Glucose, Bld: 142 mg/dL — ABNORMAL HIGH (ref 65–99)
POTASSIUM: 3.6 mmol/L (ref 3.5–5.1)
Sodium: 136 mmol/L (ref 135–145)
TOTAL PROTEIN: 5.6 g/dL — AB (ref 6.5–8.1)

## 2017-01-23 LAB — CBC WITH DIFFERENTIAL/PLATELET
BASOS ABS: 0 10*3/uL (ref 0.0–0.1)
Basophils Relative: 0 %
EOS ABS: 0 10*3/uL (ref 0.0–0.7)
EOS PCT: 0 %
HCT: 31.9 % — ABNORMAL LOW (ref 36.0–46.0)
HEMOGLOBIN: 10.2 g/dL — AB (ref 12.0–15.0)
LYMPHS ABS: 0.9 10*3/uL (ref 0.7–4.0)
Lymphocytes Relative: 7 %
MCH: 31.9 pg (ref 26.0–34.0)
MCHC: 32 g/dL (ref 30.0–36.0)
MCV: 99.7 fL (ref 78.0–100.0)
Monocytes Absolute: 0.6 10*3/uL (ref 0.1–1.0)
Monocytes Relative: 5 %
NEUTROS PCT: 88 %
Neutro Abs: 10.6 10*3/uL — ABNORMAL HIGH (ref 1.7–7.7)
PLATELETS: 254 10*3/uL (ref 150–400)
RBC: 3.2 MIL/uL — AB (ref 3.87–5.11)
RDW: 14.5 % (ref 11.5–15.5)
WBC: 12 10*3/uL — AB (ref 4.0–10.5)

## 2017-01-23 LAB — URINALYSIS, ROUTINE W REFLEX MICROSCOPIC
Bacteria, UA: NONE SEEN
Bilirubin Urine: NEGATIVE
GLUCOSE, UA: NEGATIVE mg/dL
HGB URINE DIPSTICK: NEGATIVE
KETONES UR: NEGATIVE mg/dL
LEUKOCYTES UA: NEGATIVE
Nitrite: NEGATIVE
PH: 7 (ref 5.0–8.0)
PROTEIN: NEGATIVE mg/dL
Specific Gravity, Urine: 1.012 (ref 1.005–1.030)

## 2017-01-23 LAB — MAGNESIUM: MAGNESIUM: 1.8 mg/dL (ref 1.7–2.4)

## 2017-01-23 LAB — LACTIC ACID, PLASMA: LACTIC ACID, VENOUS: 1.5 mmol/L (ref 0.5–1.9)

## 2017-01-23 MED ORDER — LORAZEPAM 2 MG/ML IJ SOLN: 1.0000 mg | INTRAMUSCULAR | Status: DC | PRN

## 2017-01-23 MED ORDER — GLYCOPYRROLATE 1 MG PO TABS: 1.0000 mg | ORAL_TABLET | ORAL | Status: DC | PRN

## 2017-01-23 MED ORDER — LORAZEPAM 2 MG/ML PO CONC: 1.0000 mg | ORAL | Status: DC | PRN

## 2017-01-23 MED ORDER — LORAZEPAM 1 MG PO TABS: 1.0000 mg | ORAL_TABLET | ORAL | Status: DC | PRN

## 2017-01-23 MED ORDER — HALOPERIDOL LACTATE 5 MG/ML IJ SOLN: 0.5000 mg | INTRAMUSCULAR | Status: DC | PRN

## 2017-01-23 MED ORDER — HALOPERIDOL LACTATE 2 MG/ML PO CONC
0.5000 mg | ORAL | Status: DC | PRN
  Filled 2017-01-23: qty 0.3

## 2017-01-23 MED ORDER — HALOPERIDOL 1 MG PO TABS: 0.5000 mg | ORAL_TABLET | ORAL | Status: DC | PRN

## 2017-01-23 MED ORDER — MORPHINE SULFATE (CONCENTRATE) 10 MG/0.5ML PO SOLN: 5.0000 mg | ORAL | Status: DC | PRN

## 2017-01-23 MED ORDER — GLYCOPYRROLATE 0.2 MG/ML IJ SOLN: 0.2000 mg | INTRAMUSCULAR | Status: DC | PRN

## 2017-01-23 MED ORDER — SODIUM CHLORIDE 0.9% FLUSH
3.0000 mL | Freq: Two times a day (BID) | INTRAVENOUS | Status: DC
  Administered 2017-01-23 – 2017-01-24 (×3): 3 mL via INTRAVENOUS

## 2017-01-23 MED ORDER — MORPHINE SULFATE (PF) 2 MG/ML IV SOLN
1.0000 mg | INTRAVENOUS | Status: DC | PRN
  Administered 2017-01-24 (×2): 1 mg via INTRAVENOUS
  Filled 2017-01-23 (×2): qty 1

## 2017-01-23 MED ORDER — SODIUM CHLORIDE 0.9% FLUSH
3.0000 mL | INTRAVENOUS | Status: DC | PRN
Start: 2017-01-23 — End: 2017-01-25
  Administered 2017-01-24: 3 mL via INTRAVENOUS
  Filled 2017-01-23: qty 3

## 2017-01-23 MED ORDER — SODIUM CHLORIDE 0.9 % IV SOLN: 250.0000 mL | INTRAVENOUS | Status: DC | PRN

## 2017-01-23 MED ORDER — ACETAMINOPHEN 650 MG RE SUPP: 650.0000 mg | Freq: Four times a day (QID) | RECTAL | Status: DC | PRN

## 2017-01-23 MED ORDER — ACETAMINOPHEN 325 MG PO TABS: 650.0000 mg | ORAL_TABLET | Freq: Four times a day (QID) | ORAL | Status: DC | PRN

## 2017-01-23 NOTE — NC FL2 (Signed)
[facility] LEVEL OF CARE SCREENING TOOL     IDENTIFICATION  Patient Name: Tara Neal Birthdate: 09/16/1941 Sex: female Admission Date (Current Location): 01/22/2017  [facility] and [place] Number:  Herbalist and Address:  [facility] [place], [place] [address], [place], [place] [address]      Provider Number: [id number]  Attending Physician Name and Address:  Lavina Hamman, MD  Relative Name and Phone Number:       Current Level of Care: Hospital Recommended Level of Care: [place] Prior Approval Number:    Date Approved/Denied:   PASRR Number: [id number]  Discharge Plan: SNF    Current Diagnoses: Patient Active Problem List   Diagnosis Date Noted  . Odontoid fracture with type II morphology ([place]) 01/22/2017  . Parkinson's disease ([place]) 01/22/2017  . Laceration of scalp 01/22/2017  . Closed displaced fracture of first cervical vertebra ([place])   . Community acquired pneumonia   . Encounter for palliative care   . Goals of care, counseling/discussion   . Adult failure to thrive 01/09/2017  . CAP (community acquired pneumonia) 01/07/2017  . Suicidal ideation 01/07/2017  . Rheumatoid arthritis ([place]) 01/07/2017  . OAB (overactive bladder) 12/31/2016  . 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 Depression 11/20/2016  . Dementia without behavioral disturbance 11/20/2016  . Dehydration 10/02/2016  . Thoracic compression fracture ([place]) 09/30/2016  . Acute diarrhea 09/30/2016  . Acute cystitis without hematuria   . Fall   . Urinary retention 07/08/2016  . UTI (lower urinary tract infection) 05/15/2016  . Bilateral hand pain 06/05/2015  . Gait disorder 04/15/2015  . Orthostatic hypotension 04/10/2015  . Recurrent falls while walking 04/10/2015  . Memory loss 04/10/2015  . Abnormal x-ray 02/20/2015  . Cough 11/18/2014  . Thrush, oral 04/04/2014  . Umbilical hernia 0000000  . Benign skin  lesion 07/24/2013  . Anemia, unspecified 12/21/2012  . Insomnia 04/22/2012  . Right hip pain 12/13/2011  . Right knee pain 12/13/2011  . Preventative health care 12/11/2011  . DIVERTICULOSIS, COLON 11/18/2010  . ARTERIOVENOUS MALFORMATION, COLON 11/18/2010  . ANXIETY 12/02/2009  . COPD (chronic obstructive pulmonary disease) ([place]) 05/27/2009  . Diabetes ([place]) 02/06/2009  . FATIGUE 12/12/2008  . Hyperlipidemia 10/01/2007  . OBESITY, MILD 10/01/2007  . MIGRAINE HEADACHE 10/01/2007  . Essential hypertension 10/01/2007  . 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 Pneumothorax 10/01/2007  . GERD 10/01/2007  . Osteoarthritis 10/01/2007  . LOW BACK PAIN 10/01/2007  . OSTEOPOROSIS 10/01/2007    Orientation RESPIRATION BLADDER Height & Weight     Self, Time, Situation  O2 (1L) Incontinent Weight:   Height:     BEHAVIORAL SYMPTOMS/MOOD NEUROLOGICAL BOWEL NUTRITION STATUS      Continent Diet (see DC summary)  AMBULATORY STATUS COMMUNICATION OF NEEDS Skin   Extensive Assist Verbally Skin abrasions (laceration to forehead)                       Personal Care Assistance Level of Assistance  Bathing, Dressing Bathing Assistance: Limited assistance   Dressing Assistance: Limited assistance     Functional Limitations Info             SPECIAL CARE FACTORS FREQUENCY  PT (By licensed PT), OT (By licensed OT)     PT Frequency: 5 OT Frequency: 5            Contractures Contractures Info: Not present    Additional  Factors Info  Code Status, Allergies Code Status Info: DNR Allergies Info: Codeine, Sulfonamide Derivatives, Ambien Zolpidem Tartrate, Penicillins, Percocet Oxycodone-acetaminophen           Current Medications (01/23/2017):  This is the current hospital active medication list Current Facility-Administered Medications  Medication Dose Route Frequency Provider Last Rate Last Dose  . acetaminophen (TYLENOL) tablet 650 mg  650 mg Oral Q6H PRN  Brenton Grills, PA-C       Or  . acetaminophen (TYLENOL) suppository 650 mg  650 mg Rectal Q6H PRN Brenton Grills, PA-C      . benazepril (LOTENSIN) tablet 40 mg  40 mg Oral Daily Brenton Grills, PA-C   40 mg at 01/23/17 [id number]  . carbidopa-levodopa (SINEMET IR) 25-100 MG per tablet immediate release 1 tablet  1 tablet Oral TID Brenton Grills, PA-C   1 tablet at 01/23/17 [id number]  . divalproex (DEPAKOTE) DR tablet 125 mg  125 mg Oral BID Brenton Grills, PA-C   125 mg at 01/23/17 [id number]  . famotidine (PEPCID) tablet 20 mg  20 mg Oral BID Brenton Grills, PA-C   20 mg at 01/23/17 [id number]  . feeding supplement (ENSURE ENLIVE) (ENSURE ENLIVE) liquid 237 mL  237 mL Oral BID BM Brenton Grills, PA-C   237 mL at 01/23/17 0939  . fesoterodine (TOVIAZ) tablet 8 mg  8 mg Oral Daily Brenton Grills, PA-C   8 mg at 01/23/17 [id number]  . gabapentin (NEURONTIN) capsule 300 mg  300 mg Oral TID Brenton Grills, PA-C   300 mg at 01/23/17 [id number]  . latanoprost (XALATAN) 0.005 % ophthalmic solution 1 drop  1 drop Both Eyes QHS Brenton Grills, PA-C   1 drop at 01/22/17 2110  . morphine 4 MG/ML injection 1 mg  1 mg Intravenous Q2H PRN Brenton Grills, PA-C   1 mg at 01/23/17 [id number]  . ondansetron (ZOFRAN) tablet 4 mg  4 mg Oral Q6H PRN Brenton Grills, PA-C       Or  . ondansetron [facility]) injection 4 mg  4 mg Intravenous Q6H PRN Brenton Grills, PA-C      . polyethylene glycol (MIRALAX / GLYCOLAX) packet 17 g  17 g Oral Daily Brenton Grills, PA-C   17 g at 01/23/17 [id number]  . senna-docusate (Senokot-S) tablet 1 tablet  1 tablet Oral Daily Sharen Hint [place], [place]   1 tablet at 01/23/17 [id number]  . simvastatin (ZOCOR) tablet 40 mg  40 mg Oral q1800 Brenton Grills, PA-C   40 mg at 01/22/17 2103  . sodium chloride flush (NS) 0.9 % injection 3 mL  3 mL Intravenous Q12H Brenton Grills, PA-C   3 mL at 01/23/17 0929  . timolol (TIMOPTIC) 0.5 % ophthalmic solution 1 drop  1 drop Both Eyes BID  Brenton Grills, PA-C   1 drop at 01/23/17 [id number]  . venlafaxine XR (EFFEXOR-XR) 24 hr capsule 150 mg  150 mg Oral Q breakfast Brenton Grills, PA-C   150 mg at 01/23/17 [id number]     Discharge Medications: Please see discharge summary for a list of discharge medications.  Relevant Imaging Results:  Relevant Lab Results:   Additional Information SSN [id number]  Lilly Cove, LCSW

## 2017-01-23 NOTE — Progress Notes (Signed)
Pt seen and examined.  No issues overnight. No family present to discuss  EXAM: Temp:  [97.3 F (36.3 C)-98.2 F (36.8 C)] 97.7 F (36.5 C) (02/25 0516) Pulse Rate:  [70-98] 81 (02/25 0516) Resp:  [18-22] 20 (02/25 0516) BP: (119-160)/(68-88) 156/68 (02/25 0516) SpO2:  [94 %-99 %] 96 % (02/25 0516) Intake/Output      02/24 0701 - 02/25 0700 02/25 0701 - 02/26 0700   P.O. 240    I.V. 257.5    Total Intake 497.5     Net +497.5          Urine Occurrence 1 x      In aspen collar Awake and alert Answering with one word sentences Follows most commands Responds to pain PERRL  Discussed with Dr Cyndy Freeze who also examined patient She is a poor surgical candidate Continue current care per IM recs

## 2017-01-23 NOTE — Progress Notes (Signed)
PT Cancellation Note  Patient Details Name: Tara Neal MRN: [id number] DOB: 01-03-1941   Cancelled Treatment:    Reason Eval/Treat Not Completed: Fatigue/lethargy limiting ability to participate. Patient supine in bed, medications on board. Will re-attempt next day if appropriate.   Tara Neal 01/23/2017, 3:15 PM Alben Deeds, [place] DPT  [phone]

## 2017-01-23 NOTE — Clinical Social Work Note (Signed)
Clinical Social Work Assessment  Patient Details  Name: Tara Neal MRN: [id number] Date of Birth: 1941-09-28  Date of referral:  01/23/17               Reason for consult:  Discharge Planning (Admitted from SNF: [facility])                Permission sought to share information with:  Case Manager, Customer service manager, Family Supports Permission granted to share information::  Yes, Verbal Permission Granted  Name::        Agency::  [facility]  Relationship::  daughter  Contact Information:  LCSW for [facility]  Housing/Transportation Living arrangements for the past 2 months:  [place], [place] of Information:  Patient, Medical Team, Case Manager, Adult Children, Outpatient Provider Patient Interpreter Needed:  None Criminal Activity/Legal Involvement Pertinent to Current Situation/Hospitalization:  No - Comment as needed Significant Relationships:  Adult Children, Other Family Members, Community Support Lives with:  Facility Resident Do you feel safe going back to the place where you live?  Yes Need for family participation in patient care:  Yes (Comment)  Care giving concerns:  Patient admitted from SNF: [facility]. Patient plans to be long term at [facility] per  Report and notes from [place] from 2/23. Patient recently placed in [facility] hospital from last admission.   No concerns noted at this time.   Social Worker assessment / plan:  LCSW compelted consult. Plan will be for patient to return at DC to [facility] where she is a current resident. No barriers currently. FL2 updated.  Employment status:  Retired Forensic scientist:  Medicare PT Recommendations:  Not assessed at this time [place] / Referral to community resources:  [place]  Patient/Family's Response to care:  Agreeable  Patient/Family's Understanding of and Emotional Response to Diagnosis, Current Treatment, and Prognosis:  Family and patient  understand current treatment recommendations, no concerns or questions at this time.  Emotional Assessment Appearance:  Appears stated age Attitude/Demeanor/Rapport:    Affect (typically observed):  Accepting, Adaptable Orientation:  Oriented to Self Alcohol / Substance use:  Not Applicable Psych involvement (Current and /or in the community):  No (Comment)  Discharge Needs  Concerns to be addressed:  No discharge needs identified Readmission within the last 30 days:  Yes Current discharge risk:  None Barriers to Discharge:  No Barriers Identified   Lilly Cove, LCSW 01/23/2017, 10:21 AM

## 2017-01-23 NOTE — Progress Notes (Signed)
[facility] Progress Note  Patient: Tara Neal [id number]   PCP: [name], MD DOB: 02-Jan-1941   DOA: 01/22/2017   DOS: 01/23/2017   Date of Service: the patient was seen and examined on 01/23/2017   Subjective: Patient is lethargic, appears to be having significant shortness of breath, she was able to tell me that she did not have any acute pain, also that she was in the hospital.  Brief hospital course: Pt. with PMH of HTN, Parkinson's disease with recurrent fall, dementia, COPD; admitted on 01/22/2017, with complaint of fall, was found to have type 2 odontoid fracture. Neurosurgery was consulted and patient felt to be a poor surgical candidate. Overnight patient has remained lethargic and tachypneic. Possibility of aspiration cannot be ruled out. Speech therapy consulted. With progressive lethargy prognosis is significantly guarded especially in the presence of history of recurrent fall with current type II odontoid fracture. Based on this I discussed with patient's family regarding goals of care, they want to focus on comfort as a priority. Currently further plan is to provide comfort care, follow palliative care recommendation.  Assessment and Plan: 1. Odontoid fracture with type II morphology [facility]) Neurosurgery consulted, felt the patient to be not a candidate for surgery. Conservative management with aspen collar. Patient required morphine for pain control and has becoming increasingly lethargic after that. Appears to be having tachypnea as well. With poor oral intake. We will monitor  2. Goals of care discussion. Overnight patient has remained lethargic and tachypneic. Possibility of aspiration cannot be ruled out. Speech therapy consulted. With progressive lethargy prognosis is significantly guarded especially in the presence of history of recurrent fall with current type II odontoid fracture. Based on this I discussed with patient's family regarding goals of care, they want to  focus on comfort as a priority.  3. Parkinson's disease. Continuing home medication.  4. Dementia Patient is nonverbal and cannot communicate needs  Provide supportive care, monitor for safety  5. HTN BP is controlled, continue ARB, monitor blood pressure readings and adjust the dose as needed  6. Multiple skin lacerations post fall  Patient underwent scalp lacerations repair Keep wound area clean and dry [place] or for hemostasis and early signs of infection  7 Leukocytosis Chest x-ray showed improvement of the left-sided opacity compared to the previous imaging Etiology is unclear, patient is afebrile and physical exam didn't reveal any findings to suggest acute infectious process Could be reactive and associated with stress of fall and trauma, continue to monitor, will obtain UA check pro-calcitonin  Bowel regimen: last BM prior to admission Diet: Soft diet, speech therapy consulted, on comfort feeds DVT Prophylaxis: mechanical compression device.  Advance goals of care discussion: DNR/DNI, on comfort care protocol, palliative care consulted  Family Communication: family was present at bedside, at the time of interview. The pt provided permission to discuss medical plan with the family. Opportunity was given to ask question and all questions were answered satisfactorily.   Disposition:  Discharge to SNF with hospice versus inpatient hospice depending on next 24-48 hours trajectory. Expected discharge date: 02/26-27/2018,  Consultants: Neurosurgery, palliative care Procedures: none  Antibiotics: Anti-infectives    None        Objective: Physical Exam: Vitals:   01/22/17 1755 01/22/17 2215 01/23/17 0116 01/23/17 0516  BP: (!) 160/87 125/74 130/76 (!) 156/68  Pulse: 98 70 72 81  Resp: 20 20 20 20   Temp: 98.2 F (36.8 C) 97.4 F (36.3 C) 97.3 F (36.3 C) 97.7 F (  36.5 C)  TempSrc: Oral Oral Axillary Axillary  SpO2: 99% 98% 94% 96%    Intake/Output Summary  (Last 24 hours) at 01/23/17 1616 Last data filed at 01/22/17 1900  Gross per 24 hour  Intake            497.5 ml  Output                0 ml  Net            497.5 ml   There were no vitals filed for this visit.  General: Alert, Awake and Oriented to place and Person. Appear in moderate distress, affect appropriate Eyes: PERRL, Conjunctiva normal ENT: Oral Mucosa clear dry. Neck: difficult to assess JVD, no Abnormal Mass Or lumps Cardiovascular: S1 and S2 Present, aortic systolic Murmur, Respiratory: Bilateral Air entry equal and Decreased, no use of accessory muscle, basal Crackles, no wheezes Abdomen: Bowel Sound present, Soft and no tenderness Skin: no redness, no Rash, no induration Extremities: no Pedal edema, no calf tenderness Neurologic: Grossly no focal neuro deficit. Bilaterally Equal motor strength  Data Reviewed: CBC:  Recent Labs Lab 01/22/17 1211 01/23/17 0957  WBC 16.1* 12.0*  NEUTROABS 14.5* 10.6*  HGB 11.6* 10.2*  HCT 35.0* 31.9*  MCV 98.3 99.7  PLT 263 0000000   Basic Metabolic Panel:  Recent Labs Lab 01/22/17 1211 01/23/17 0957  NA 138 136  K 3.9 3.6  CL 109 108  CO2 22 22  GLUCOSE 126* 142*  BUN 13 16  CREATININE 0.61 0.56  CALCIUM 8.7* 8.4*  MG  --  1.8    Liver Function Tests:  Recent Labs Lab 01/22/17 1211 01/23/17 0957  AST 29 34  ALT 14 7*  ALKPHOS 56 53  BILITOT 0.6 0.5  PROT 6.2* 5.6*  ALBUMIN 3.5 3.1*   No results for input(s): LIPASE, AMYLASE in the last 168 hours. No results for input(s): AMMONIA in the last 168 hours. Coagulation Profile:  Recent Labs Lab 01/22/17 1211  INR 1.01   Cardiac Enzymes: No results for input(s): CKTOTAL, CKMB, CKMBINDEX, TROPONINI in the last 168 hours. BNP (last 3 results) No results for input(s): PROBNP in the last 8760 hours.  CBG: No results for input(s): GLUCAP in the last 168 hours.  Studies: Dg Chest Port 1 View  Result Date: 01/23/2017 CLINICAL DATA:  Shortness of breath.   Subsequent encounter. EXAM: PORTABLE CHEST 1 VIEW COMPARISON:  01/22/2017 FINDINGS: There is persistent linear opacity at the left lung base consistent with atelectasis. Lung volumes are low. Lungs are otherwise clear. Cardiac silhouette is normal in size. No mediastinal or hilar masses. No pleural effusion or pneumothorax. IMPRESSION: 1. No significant change from the previous day's study. Persistent left base atelectasis. Electronically Signed   By: Lajean Manes M.D.   On: 01/23/2017 10:00     Scheduled Meds: . benazepril  40 mg Oral Daily  . carbidopa-levodopa  1 tablet Oral TID  . divalproex  125 mg Oral BID  . famotidine  20 mg Oral BID  . feeding supplement (ENSURE ENLIVE)  237 mL Oral BID BM  . fesoterodine  8 mg Oral Daily  . gabapentin  300 mg Oral TID  . latanoprost  1 drop Both Eyes QHS  . polyethylene glycol  17 g Oral Daily  . senna-docusate  1 tablet Oral Daily  . simvastatin  40 mg Oral q1800  . sodium chloride flush  3 mL Intravenous Q12H  . sodium chloride flush  3  mL Intravenous Q12H  . timolol  1 drop Both Eyes BID  . venlafaxine XR  150 mg Oral Q breakfast   Continuous Infusions: PRN Meds: sodium chloride, acetaminophen **OR** acetaminophen, glycopyrrolate **OR** glycopyrrolate **OR** glycopyrrolate, haloperidol **OR** haloperidol **OR** haloperidol lactate, LORazepam **OR** LORazepam **OR** LORazepam, morphine injection, morphine CONCENTRATE **OR** morphine CONCENTRATE, ondansetron **OR** ondansetron (ZOFRAN) IV, sodium chloride flush  Time spent: 30 minutes  Author: Berle Mull, MD [facility] Pager: [phone] 01/23/2017 4:16 PM  If 7PM-7AM, please contact night-coverage at [email or web address], password [facility]

## 2017-01-23 NOTE — Progress Notes (Signed)
Responded to consult for end of life. Nurse said no family now present. Pt alone in rm, asleep. Offered silent prayer. Will ask colleague to check back tomorrow.   01/23/17 2100  Clinical Encounter Type  Visited With Patient not available  Visit Type Initial  Referral From Nurse  Gerrit Heck, Chaplain

## 2017-01-23 NOTE — Progress Notes (Signed)
SLP Cancellation Note  Patient Details Name: Tara Neal MRN: [id number] DOB: December 18, 1940   Cancelled treatment:       Reason Eval/Treat Not Completed: Fatigue/lethargy limiting ability to participate. RN reports pt sedated with morphine. Opens eyes briefly with verbal, tactile stimulus, is otherwise not responsive or alert. SLP will f/u when appropriate.  Deneise Lever, [place] CF-SLP Speech-Language Pathologist [phone]   Tara Neal 01/23/2017, 1:38 PM

## 2017-01-24 DIAGNOSIS — Z515 Encounter for palliative care: Secondary | ICD-10-CM

## 2017-01-24 DIAGNOSIS — R630 Anorexia: Secondary | ICD-10-CM

## 2017-01-24 DIAGNOSIS — W19XXXD Unspecified fall, subsequent encounter: Secondary | ICD-10-CM

## 2017-01-24 DIAGNOSIS — Z7189 Other specified counseling: Secondary | ICD-10-CM

## 2017-01-24 MED ORDER — MORPHINE SULFATE (PF) 2 MG/ML IV SOLN
1.0000 mg | INTRAVENOUS | Status: DC | PRN
  Administered 2017-01-24 (×2): 2 mg via INTRAVENOUS
  Filled 2017-01-24 (×2): qty 1

## 2017-01-24 MED ORDER — MORPHINE SULFATE (CONCENTRATE) 10 MG/0.5ML PO SOLN
5.0000 mg | Freq: Four times a day (QID) | ORAL | Status: DC
Start: 2017-01-24 — End: 2017-01-25
  Administered 2017-01-24 – 2017-01-25 (×3): 5 mg via ORAL
  Filled 2017-01-24 (×3): qty 0.5

## 2017-01-24 MED ORDER — MORPHINE SULFATE (CONCENTRATE) 10 MG/0.5ML PO SOLN
2.5000 mg | ORAL | Status: DC | PRN
  Filled 2017-01-24: qty 0.5

## 2017-01-24 MED ORDER — METOPROLOL TARTRATE 25 MG PO TABS
25.0000 mg | ORAL_TABLET | Freq: Two times a day (BID) | ORAL | Status: DC
  Administered 2017-01-24: 25 mg via ORAL
  Filled 2017-01-24 (×2): qty 1

## 2017-01-24 NOTE — Consult Note (Signed)
Consultation Note Date: 01/24/2017   Patient Name: Tara Neal  DOB: 1941/09/24  MRN: [id number]  Age / Sex: 76 y.o., female  PCP: Tara Borg, MD Referring Physician: Lavina Hamman, MD  Reason for Consultation: Establishing goals of care r/t adult failure to thrive for months now further complicated by type II odontoid fracture and she is not a candidate for surgery.   HPI/Patient Profile: 76 y.o. female  with past medical history of HTN, Parkinson's disease with recurrent fall, dementia, COPD, recurrent pneumonia admitted on 01/22/2017 with fall and type II odontoid fracture.   Clinical Assessment and Goals of Care: Tara Neal is alert when I come in room and her husband and son-in-law are at bedside. Tara Neal is tachypneic with mildly labored breathing and tells me that she is hurting when I ask. Have requested pain medication from RN.   I discussed further Tara Neal's decline. I have seen notation from Tara Neal 01/07/2017 that Tara Neal has been declining with unintentional weight loss, poor intake, functional decline and recurrent falls for a few months now. They had hospice discussion at this time. She was also treated for pneumonia at this admission and was also being treated for recurrent pneumonia at [facility] prior to this admission per notes.   Family endorses Tara Neal decline towards EOL. They tell me that she only ate a couple bites of potatoes yesterday. She is requiring pain medication and we discussed scheduled pain medication and that this may make her more lethargic and she may take in even less food/drink. They are okay with this and say they prefer for her to be restful and not in pain. They have discussed as a family and would prefer for her to go to [facility] in particular for EOL. They understand that this will be for full comfort care and no labs, IVF, but comfort meds. They agree with  this path for comfort care. Tara Neal is scheduled for surgery this Thursday so would like her to transition to hospice so he knows she will be receiving good care prior to his surgery. Emotional support provided.   Primary Decision Maker NEXT OF KIN husband Tara Neal    SUMMARY OF RECOMMENDATIONS   - DNR - Comfort care - Transition to hospice facility  Code Status/Advance Care Planning:  DNR   Symptom Management:   Pain/dyspnea: Roxanol 5 mg every 6 hours scheduled (may need increased). Roxanol 2.5-5 mg every 2 hours prn.   Anxiety: Ativan prn.   Bowel: Senokot 1 tablet daily.   Comfort feeds dys 3, thin liquids.   Palliative Prophylaxis:   Aspiration, Bowel Regimen, Delirium Protocol, Frequent Pain Assessment, Oral Care, Palliative Wound Care and Turn Reposition  Additional Recommendations (Limitations, Scope, Preferences):  Full Comfort Care  Psycho-social/Spiritual:   Desire for further Chaplaincy support:yes  Additional Recommendations: Caregiving  Support/Resources, Education on Hospice and Grief/Bereavement Support  Prognosis:   < 2 weeks with increasing symptom management and declining functional status and intake.   Discharge  Planning: Hospice facility      Primary Diagnoses: Present on Admission: . Odontoid fracture with type II morphology ([place]) . COPD (chronic obstructive pulmonary disease) ([place]) . Dementia without behavioral disturbance . Fall . Depression . GERD . Hyperlipidemia . Essential hypertension   I have reviewed the medical record, interviewed the patient and family, and examined the patient. The following aspects are pertinent.  Past Medical History:  Diagnosis Date  . Allergy   . Anemia    hx of   . Anxiety   . Chronic insomnia 04/22/2012  . COPD (chronic obstructive pulmonary disease) ([place])   . Dementia   . Diverticulosis of colon   . GERD (gastroesophageal reflux disease)   . Glaucoma   . Hormone replacement therapy  (postmenopausal)   . Hyperlipidemia   . Hypertension   . Low back pain   . Migraine headache    denies  . Mild obesity   . Osteoarthritis   . Osteopenia   . Osteoporosis   . Pneumonia    hx of 5 years ago   . Scoliosis   . Shortness of breath    with exertion   . Spontaneous pneumothorax    1968  . Umbilical hernia [id number]   Social History   Social History  . Marital status: Married    Spouse name: N/A  . Number of children: N/A  . Years of education: N/A   Social History Main Topics  . Smoking status: Former Smoker    Years: 20.00    Types: Cigarettes    Quit date: 11/29/1986  . Smokeless tobacco: Never Used  . Alcohol use No     Comment: one glass of wine every 3 months  . Drug use: No  . Sexual activity: No   Other Topics Concern  . 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 Hyperlipidemia Mother   . Heart disease Mother   . Diabetes Mother    Scheduled Meds: . benazepril  40 mg Oral Daily  . carbidopa-levodopa  1 tablet Oral TID  . divalproex  125 mg Oral BID  . famotidine  20 mg Oral BID  . feeding supplement (ENSURE ENLIVE)  237 mL Oral BID BM  . fesoterodine  8 mg Oral Daily  . gabapentin  300 mg Oral TID  . latanoprost  1 drop Both Eyes QHS  . metoprolol tartrate  25 mg Oral BID  . morphine CONCENTRATE  5 mg Oral Q6H  . polyethylene glycol  17 g Oral Daily  . senna-docusate  1 tablet Oral Daily  . simvastatin  40 mg Oral q1800  . sodium chloride flush  3 mL Intravenous Q12H  . sodium chloride flush  3 mL Intravenous Q12H  . timolol  1 drop Both Eyes BID  . venlafaxine XR  150 mg Oral Q breakfast   Continuous Infusions: PRN Meds:.sodium chloride, acetaminophen **OR** acetaminophen, glycopyrrolate **OR** glycopyrrolate **OR** glycopyrrolate, haloperidol **OR** haloperidol **OR** haloperidol lactate, LORazepam **OR** LORazepam **OR** LORazepam, morphine CONCENTRATE  **OR** morphine injection, ondansetron **OR** ondansetron (ZOFRAN) IV, sodium chloride flush Allergies  Allergen Reactions  . Codeine Nausea Only and Other (See Comments)    Can take low doses  . Sulfonamide Derivatives Nausea Only  . Ambien [Zolpidem Tartrate] Anxiety  . Penicillins Rash and Other (See Comments)    .Marland KitchenHas patient had a PCN reaction causing immediate rash, facial/tongue/throat swelling, SOB  or lightheadedness with hypotension: No Has patient had a PCN reaction causing severe rash involving mucus membranes or skin necrosis: No Has patient had a PCN reaction that required hospitalization No Has patient had a PCN reaction occurring within the last 10 years: No If all of the above answers are "NO", then may proceed with Cephalosporin use.   Marland Kitchen Percocet [Oxycodone-Acetaminophen] Itching   Review of Neal  Unable to perform ROS: Dementia    Physical Exam  Constitutional: She appears well-developed.  Cardiovascular: Normal rate and regular rhythm.   Pulmonary/Chest: Breath sounds normal. No accessory muscle usage. Tachypnea noted. She is in respiratory distress.  Mild-mod distress  Abdominal: Soft. Normal appearance.  Neurological: She is alert. She is disoriented.  Psychiatric: Her mood appears anxious.  Nursing note and vitals reviewed.   Vital Signs: BP (!) 164/90 (BP Location: Left Arm)   Pulse 73   Temp 98.2 F (36.8 C) (Axillary)   Resp 20   SpO2 95%  Pain Assessment: PAINAD   Pain Score: Asleep   SpO2: SpO2: 95 % O2 Device:SpO2: 95 % O2 Flow Rate: .O2 Flow Rate (L/min): 1 L/min  IO: Intake/output summary:  Intake/Output Summary (Last 24 hours) at 01/24/17 1057 Last data filed at 01/24/17 0700  Gross per 24 hour  Intake               25 ml  Output                0 ml  Net               25 ml    LBM:   Baseline Weight:   Most recent weight:       Palliative Assessment/Data:     Time In: 1020 Time Out: 1120 Time Total: 34min Greater than  50%  of this time was spent counseling and coordinating care related to the above assessment and plan.  Signed by: Vinie Sill, NP Palliative Medicine Team Pager # [phone] (M-F 8a-5p) Team Phone # [phone] (Nights/Weekends)

## 2017-01-24 NOTE — Progress Notes (Signed)
[facility] Progress Note  Patient: Tara Neal [id number]   PCP: [name], MD DOB: [birth year]   DOA: 01/22/2017   DOS: 01/24/2017   Date of Service: the patient was seen and examined on 01/24/2017   Subjective: Patient Remains lethargic, and has shortness of breath,  Brief hospital course: Pt. with PMH of HTN, Parkinson's disease with recurrent fall, dementia, COPD; admitted on 01/22/2017, with complaint of fall, was found to have type 2 odontoid fracture. Neurosurgery was consulted and patient felt to be a poor surgical candidate. Overnight patient has remained lethargic and tachypneic. Possibility of aspiration cannot be ruled out. Speech therapy consulted. With progressive lethargy prognosis is significantly guarded especially in the presence of history of recurrent fall with current type II odontoid fracture. Based on this I discussed with patient's family regarding goals of care, they want to focus on comfort as a priority. Currently further plan is to provide comfort care, follow palliative care recommendation.  Assessment and Plan: 1. Odontoid fracture with type II morphology [facility]) Neurosurgery consulted, felt the patient to be not a candidate for surgery. Conservative management with aspen collar. Patient required morphine for pain control and has becoming increasingly lethargic after that. Appears to be having tachypnea as well. With poor oral intake. We will monitor  2. Goals of care discussion. patient has remained lethargic and tachypneic. Possibility of aspiration cannot be ruled out. Speech therapy consulted. With progressive lethargy prognosis is significantly guarded especially in the presence of history of recurrent fall with current type II odontoid fracture. Based on this I discussed with patient's family regarding goals of care, they want to focus on comfort as a priority. Patient will be transferred to residential hospice.  3. Parkinson's disease. Continuing home  medication.  4. Dementia Patient is nonverbal and cannot communicate needs  Provide supportive care, monitor for safety  5. HTN BP is elevated. Continue current regimen  6. Multiple skin lacerations post fall  Patient underwent scalp lacerations repair Keep wound area clean and dry Currently on comfort care  7 Leukocytosis Chest x-ray showed improvement of the left-sided opacity compared to the previous imaging Etiology is unclear, patient is afebrile and physical exam didn't reveal any findings to suggest acute infectious process. On comfort care  Bowel regimen: last BM prior to admission Diet: Soft diet, speech therapy consulted, on comfort feeds DVT Prophylaxis: mechanical compression device.  Advance goals of care discussion: DNR/DNI, on comfort care protocol, palliative care consulted  Family Communication: family was present at bedside, at the time of interview. The pt provided permission to discuss medical plan with the family. Opportunity was given to ask question and all questions were answered satisfactorily.   Disposition:  Discharge to SNF with hospice versus inpatient hospice depending on next 24-48 hours trajectory. Expected discharge date: 01/25/2017, depending on bed availability at hospice  Consultants: Neurosurgery, palliative care Procedures: none  Antibiotics: Anti-infectives    None        Objective: Physical Exam: Vitals:   01/24/17 0148 01/24/17 0518 01/24/17 0944 01/24/17 1346  BP: (!) 172/87 (!) 163/97 (!) 164/90 (!) 166/87  Pulse: 92 87 73 77  Resp: 20 20 20 20   Temp: 98.2 F (36.8 C) 97.8 F (36.6 C) 98.2 F (36.8 C) 99 F (37.2 C)  TempSrc: Oral Oral Axillary Axillary  SpO2: 94% 93% 95% 94%    Intake/Output Summary (Last 24 hours) at 01/24/17 1703 Last data filed at 01/24/17 0700  Gross per 24 hour  Intake  25 ml  Output                0 ml  Net               25 ml   There were no vitals filed for this  visit.  General: Alert, Awake and Oriented to Person. Appear in moderate distress. ENT: Oral Mucosa clear dry. Neck: difficult to assess JVD, no Abnormal Mass Or lumps Cardiovascular: S1 and S2 Present, aortic systolic Murmur, Respiratory: Bilateral Air entry equal and Decreased, no use of accessory muscle, basal Crackles, no wheezes Abdomen: Bowel Sound present,   Data Reviewed: CBC:  Recent Labs Lab 01/22/17 1211 01/23/17 0957  WBC 16.1* 12.0*  NEUTROABS 14.5* 10.6*  HGB 11.6* 10.2*  HCT 35.0* 31.9*  MCV 98.3 99.7  PLT 263 0000000   Basic Metabolic Panel:  Recent Labs Lab 01/22/17 1211 01/23/17 0957  NA 138 136  K 3.9 3.6  CL 109 108  CO2 22 22  GLUCOSE 126* 142*  BUN 13 16  CREATININE 0.61 0.56  CALCIUM 8.7* 8.4*  MG  --  1.8    Liver Function Tests:  Recent Labs Lab 01/22/17 1211 01/23/17 0957  AST 29 34  ALT 14 7*  ALKPHOS 56 53  BILITOT 0.6 0.5  PROT 6.2* 5.6*  ALBUMIN 3.5 3.1*   No results for input(s): LIPASE, AMYLASE in the last 168 hours. No results for input(s): AMMONIA in the last 168 hours. Coagulation Profile:  Recent Labs Lab 01/22/17 1211  INR 1.01   Cardiac Enzymes: No results for input(s): CKTOTAL, CKMB, CKMBINDEX, TROPONINI in the last 168 hours. BNP (last 3 results) No results for input(s): PROBNP in the last 8760 hours.  CBG: No results for input(s): GLUCAP in the last 168 hours.  Studies: No results found.   Scheduled Meds: . benazepril  40 mg Oral Daily  . carbidopa-levodopa  1 tablet Oral TID  . divalproex  125 mg Oral BID  . famotidine  20 mg Oral BID  . feeding supplement (ENSURE ENLIVE)  237 mL Oral BID BM  . fesoterodine  8 mg Oral Daily  . gabapentin  300 mg Oral TID  . latanoprost  1 drop Both Eyes QHS  . metoprolol tartrate  25 mg Oral BID  . morphine CONCENTRATE  5 mg Oral Q6H  . senna-docusate  1 tablet Oral Daily  . sodium chloride flush  3 mL Intravenous Q12H  . sodium chloride flush  3 mL Intravenous  Q12H  . timolol  1 drop Both Eyes BID  . venlafaxine XR  150 mg Oral Q breakfast   Continuous Infusions: PRN Meds: sodium chloride, acetaminophen **OR** acetaminophen, glycopyrrolate **OR** glycopyrrolate **OR** glycopyrrolate, haloperidol **OR** haloperidol **OR** haloperidol lactate, LORazepam **OR** LORazepam **OR** LORazepam, morphine CONCENTRATE **OR** morphine injection, ondansetron **OR** ondansetron (ZOFRAN) IV, sodium chloride flush  Time spent: 30 minutes  Author: Berle Mull, MD [facility] Pager: [phone] 01/24/2017 5:03 PM  If 7PM-7AM, please contact night-coverage at [email or web address], password [facility]

## 2017-01-24 NOTE — Progress Notes (Signed)
Nutrition Brief Note  Patient identified on the Malnutrition Screening Tool (MST) Report  Wt Readings from Last 15 Encounters:  01/07/17 118 lb (53.5 kg)  12/30/16 118 lb (53.5 kg)  12/14/16 118 lb (53.5 kg)  12/06/16 118 lb (53.5 kg)  11/18/16 117 lb (53.1 kg)  10/22/16 120 lb (54.4 kg)  09/29/16 130 lb (59 kg)  07/08/16 130 lb (59 kg)  07/01/16 130 lb (59 kg)  06/02/16 130 lb (59 kg)  05/15/16 127 lb 13.9 oz (58 kg)  03/31/16 135 lb (61.2 kg)  03/16/16 131 lb 2 oz (59.5 kg)  02/24/16 137 lb (62.1 kg)  11/10/15 139 lb (63 kg)    Chart reviewed. Pt's family has met with palliative medicine and decision was made for comfort care and transfer to [facility].  No nutrition interventions warranted at this time.   Scarlette Ar RD, LDN, CSP Inpatient Clinical Dietitian Pager: [phone] After Hours Pager: [phone]

## 2017-01-24 NOTE — Clinical Social Work Note (Signed)
Clinical Social Worker received notification from Palliative Medicine that patient family is now interested in [facility].  CSW contacted [facility] to provide initial referral information.  Hospice RN, Judeen Hammans, met with patient family to determine patient eligibility and will follow up with CSW tomorrow regarding bed availability.  CSW remains available for support and to facilitate patient discharge needs once medically stable.  Barbette Or, Tara Neal

## 2017-01-24 NOTE — Care Management Note (Signed)
Case Management Note  Patient Details  Name: Tara Neal MRN: [id number] Date of Birth: 1941/07/19  Subjective/Objective:                 Patient was admitted from [facility] s/p unwitnessed fall.  She is bed/wheelchair bound at baseline.  CM will follow for discharge needs pending patient's progress and physician orders.   Action/Plan:   Expected Discharge Date:                  Expected Discharge Plan:     In-House Referral:     Discharge planning Services     Post Acute Care Choice:    Choice offered to:     DME Arranged:    DME Agency:     HH Arranged:    HH Agency:     Status of Service:     If discussed at [employer] of Stay Meetings, dates discussed:    Additional Comments:  Rolm Baptise, RN 01/24/2017, 10:43 AM

## 2017-01-24 NOTE — Progress Notes (Signed)
[facility] Liaison - RN Visit  Received request from [facility] for family interest in [facility] for possible transfer within next 24 hours. Chart reviewed and received report from bedside RN. Met with husband Wynonia Lawman and son-in-law to confirm interest and explain services.  Family agreeable to transfer.  Family and SW Denyse Amass made aware there is no bed availability at [facility] today but [facility] Liaison will continue to follow and check in with family/SW about bed availability in the morning.    Thank you for referral, Gar Ponto, [facility] Liaison  Please contact [facility] for any questions/concerns at [phone] may be found on [place].

## 2017-01-25 MED ORDER — HALOPERIDOL LACTATE 5 MG/ML IJ SOLN: 0.5000 mg | INTRAMUSCULAR | Status: AC | PRN

## 2017-01-25 MED ORDER — LORAZEPAM 2 MG/ML IJ SOLN: 1.0000 mg | INTRAMUSCULAR | 0 refills | Status: AC | PRN

## 2017-01-25 MED ORDER — AMLODIPINE BESYLATE 5 MG PO TABS: 5.0000 mg | ORAL_TABLET | Freq: Every day | ORAL | 0 refills | Status: AC

## 2017-01-25 MED ORDER — MORPHINE SULFATE (CONCENTRATE) 10 MG/0.5ML PO SOLN: 5.0000 mg | ORAL | Status: AC

## 2017-01-25 MED ORDER — GLYCOPYRROLATE 0.2 MG/ML IJ SOLN: 0.2000 mg | INTRAMUSCULAR | 0 refills | Status: AC | PRN

## 2017-01-25 MED ORDER — MORPHINE SULFATE (CONCENTRATE) 10 MG/0.5ML PO SOLN
5.0000 mg | ORAL | Status: DC
Start: 2017-01-25 — End: 2017-01-25
  Administered 2017-01-25 (×2): 5 mg via ORAL
  Filled 2017-01-25: qty 0.5

## 2017-01-25 MED ORDER — AMLODIPINE BESYLATE 5 MG PO TABS
5.0000 mg | ORAL_TABLET | Freq: Every day | ORAL | Status: DC
  Filled 2017-01-25: qty 1

## 2017-01-25 MED ORDER — MORPHINE SULFATE (CONCENTRATE) 10 MG/0.5ML PO SOLN: 2.5000 mg | ORAL | Status: AC | PRN

## 2017-01-25 NOTE — Discharge Summary (Addendum)
[facility] Discharge Summary   Patient: Tara Neal [id number]   PCP: [name], MD DOB: January 07, 1941   Date of admission: 01/22/2017   Date of discharge:  01/25/2017    Discharge Diagnoses:  Principal Problem:   Odontoid fracture with type II morphology ([place]) Active Problems:   Hyperlipidemia   Essential hypertension   COPD (chronic obstructive pulmonary disease) ([place])   GERD   Fall   Depression   Dementia without behavioral disturbance   Parkinson's disease ([place])   Laceration of scalp   Closed displaced fracture of first cervical vertebra ([place])   Poor appetite   Palliative care encounter   Admitted From: SNF Disposition:  [facility], hospice  Recommendations for Outpatient Follow-up:  1. Establish are with hospice   Diet recommendation: comfortfeeds  Activity: The patient is advised to gradually reintroduce usual activities.  Discharge Condition: stable  Code Status: DNR DNI- going to inpatient hospice  History of present illness: As per the H and P dictated on admission, "Tara Neal is a 76 y.o. female with medical history significant of dementia, COPD, anemia, anxiety, Parkinson's disease with recurrent falls, HTN, HLD, GERD, osteoarthritis, who presented to the ED after she sustained unwitnessed fall in the facility Patient is unable to recall the events and circumstances surrounding her fall and it is unclear whether she fell from the bed and or wheelchair She is essentially nonverbal at baseline and family provided the information on presentation Patient was hospitalized recently for PNA and failure to thrive and discharged on 01/10/2017 with plan for palliative care to follow her in the facility"  Hospital Course:   Summary of her active problems in the hospital is as following. 1. Odontoid fracture with type II morphology [facility]) Neurosurgery consulted, felt the patient to be not a candidate for surgery. Conservative management with aspen  collar. Continue pain management   2. Goals of care discussion. patient has remained lethargic and tachypneic. Possibility of aspiration cannot be ruled out. Speech therapy consulted. With progressive lethargy prognosis is significantly guarded especially in the presence of history of recurrent fall with current type II odontoid fracture. Based on this I discussed with patient's family regarding goals of care, they want to focus on comfort as a priority. Patient will be transferred to residential hospice.  3. Parkinson's disease. Continuing home medication.  4. Dementia Patient is nonverbal and cannot communicate needs  Provide supportive care, monitor for safety  5. HTN BP is elevated. Added amlodipine, continue lisinopril  6. Multiple skin lacerations post fall  Patient underwent scalp lacerations repair Keep woundarea clean and dry Currently on comfort care  7 Leukocytosis Chest x-ray showed improvement ofthe left-sided opacity compared to the previous imaging Etiology is unclear, patient is afebrile and physical exam didn't reveal any findings to suggest acute infectious process. On comfort care  All other chronic medical condition were stable during the hospitalization.  Transfer to inpatient hospice arranged by social worker and case Freight forwarder. On the day of the discharge the patient's symptoms were controlled, and no other acute medical condition were reported by patient. the patient was felt safe to be discharge at [facility] with inpatient hospice.  Procedures and Results:  none   Consultations:  Neurosurgery   Palliative care  DISCHARGE MEDICATION: Current Discharge Medication List    START taking these medications   Details  amLODipine (NORVASC) 5 MG tablet Take 1 tablet (5 mg total) by mouth daily. Qty: 30 tablet, Refills: 0    glycopyrrolate (  ROBINUL) 0.2 MG/ML injection Inject 1 mL (0.2 mg total) into the skin every 4 (four) hours as needed  (excessive secretions). Qty: 1 mL, Refills: 0    haloperidol lactate (HALDOL) 5 MG/ML injection Inject 0.1 mLs (0.5 mg total) into the vein every 4 (four) hours as needed (or delirium). Qty: 1 mL    LORazepam (ATIVAN) 2 MG/ML injection Inject 0.5 mLs (1 mg total) into the vein every 4 (four) hours as needed for anxiety. Qty: 1 mL, Refills: 0    !! Morphine Sulfate (MORPHINE CONCENTRATE) 10 MG/0.5ML SOLN concentrated solution Take 0.13-0.25 mLs (2.6-5 mg total) by mouth every 2 (two) hours as needed for severe pain or shortness of breath. Qty: 180 mL    !! Morphine Sulfate (MORPHINE CONCENTRATE) 10 MG/0.5ML SOLN concentrated solution Take 0.25 mLs (5 mg total) by mouth every 4 (four) hours. Qty: 180 mL     !! - Potential duplicate medications found. Please discuss with provider.    CONTINUE these medications which have NOT CHANGED   Details  acetaminophen (TYLENOL) 325 MG tablet Take 2 tablets (650 mg total) by mouth every 6 (six) hours as needed for mild pain (or Fever >/= 101).    alendronate (FOSAMAX) 70 MG tablet Take 70 mg by mouth every Saturday.     Ascorbic Acid (VITAMIN C) 1000 MG tablet Take 1,000 mg by mouth daily.    benazepril (LOTENSIN) 40 MG tablet TAKE 1 TABLET EVERY DAY Qty: 90 tablet, Refills: 3    calcium carbonate (OS-CAL) 600 MG TABS Take 600 mg by mouth 2 (two) times daily with a meal.    carbidopa-levodopa (SINEMET IR) 25-100 MG tablet Take 1 tablet by mouth 3 (three) times daily. Qty: 90 tablet, Refills: 3    divalproex (DEPAKOTE) 125 MG DR tablet Take 125 mg by mouth 2 (two) times daily.    famotidine (PEPCID) 20 MG tablet Take 1 tablet (20 mg total) by mouth 2 (two) times daily.    feeding supplement, ENSURE ENLIVE, (ENSURE ENLIVE) LIQD Take 237 mLs by mouth 2 (two) times daily between meals. Qty: 237 mL, Refills: 12    gabapentin (NEURONTIN) 300 MG capsule Take 300 mg by mouth 3 (three) times daily.    guaiFENesin (MUCINEX) 600 MG 12 hr tablet Take  by mouth 2 (two) times daily as needed.     latanoprost (XALATAN) 0.005 % ophthalmic solution Place 1 drop into both eyes at bedtime.     polyethylene glycol (MIRALAX / GLYCOLAX) packet Take 17 g by mouth daily.     senna-docusate (SENEXON-S) 8.6-50 MG tablet Take 1 tablet by mouth daily.    timolol (TIMOPTIC) 0.5 % ophthalmic solution Place 1 drop into both eyes 2 (two) times daily.     tolterodine (DETROL LA) 4 MG 24 hr capsule Take 4 mg by mouth daily.    venlafaxine XR (EFFEXOR-XR) 150 MG 24 hr capsule TAKE 1 CAPSULE EVERY DAY WITH BREAKFAST Qty: 90 capsule, Refills: 3      STOP taking these medications     cetirizine (ZYRTEC) 10 MG tablet      Cholecalciferol (VITAMIN D3) 1000 UNITS CAPS      folic acid (FOLVITE) 1 MG tablet      meloxicam (MOBIC) 15 MG tablet      methotrexate 250 MG/10ML injection      Multiple Vitamin (TAB-A-VITE PO)      Omega-3 Fatty Acids (SEA-OMEGA 30) 1200 MG CAPS      simvastatin (ZOCOR) 40 MG tablet  denosumab (PROLIA) 60 MG/ML SOLN injection        Allergies  Allergen Reactions  . Codeine Nausea Only and Other (See Comments)    Can take low doses  . Sulfonamide Derivatives Nausea Only  . Ambien [Zolpidem Tartrate] Anxiety  . Penicillins Rash and Other (See Comments)    .Marland KitchenHas patient had a PCN reaction causing immediate rash, facial/tongue/throat swelling, SOB or lightheadedness with hypotension: No Has patient had a PCN reaction causing severe rash involving mucus membranes or skin necrosis: No Has patient had a PCN reaction that required hospitalization No Has patient had a PCN reaction occurring within the last 10 years: No If all of the above answers are "NO", then may proceed with Cephalosporin use.   Marland Kitchen Percocet [Oxycodone-Acetaminophen] Itching   Discharge Instructions    Increase activity slowly    Complete by:  As directed    May have comfort feeds from floor stock    Complete by:  As directed      Discharge  Exam: There were no vitals filed for this visit. Vitals:   01/25/17 0118 01/25/17 0436  BP: (!) 164/89 (!) 157/82  Pulse: 78 78  Resp: 20 20  Temp: 98.2 F (36.8 C) 98.8 F (37.1 C)   General: Appear in moderate distress, no Rash; Oral Mucosa dry. Cardiovascular: S1 and S2 Present, no Murmur Respiratory: Bilateral Air entry present and no Crackles, no wheezes Abdomen: Bowel Sound present,  The results of significant diagnostics from this hospitalization (including imaging, microbiology, ancillary and laboratory) are listed below for reference.    Significant Diagnostic Studies: Dg Chest 1 View  Result Date: 01/22/2017 CLINICAL DATA:  Fall on floor.  Recent pneumonia. EXAM: CHEST 1 VIEW COMPARISON:  01/07/2017 and prior radiographs FINDINGS: The patient is rotated. Upper limits normal heart size noted. Left basilar opacity has slightly improved as compared to the prior study. No new airspace disease, pleural effusion or pneumothorax identified. Vertebral augmentation changes within the lower thoracic spine again noted. IMPRESSION: No acute abnormality. Slightly improved left basilar opacity/airspace disease since the prior study. Electronically Signed   By: Margarette Canada M.D.   On: 01/22/2017 10:16   Dg Chest 2 View  Result Date: 01/07/2017 CLINICAL DATA:  Coughing. EXAM: CHEST  2 VIEW COMPARISON:  12/18/2016 FINDINGS: AP and lateral views of the chest show new airspace disease left lung base, suspicious for pneumonia. Right lung appears hyperexpanded but clear. Cardiopericardial silhouette is at upper limits of normal for size. The visualized bony structures of the thorax are intact. Lower thoracic vertebral augmentation again noted. IMPRESSION: New left base airspace disease, suspicious for pneumonia. Electronically Signed   By: Misty Stanley M.D.   On: 01/07/2017 16:12   Dg Chest 2 View  Result Date: 12/26/2016 CLINICAL DATA:  Worsening right-sided weakness. Worsening back pain. EXAM:  CHEST  2 VIEW COMPARISON:  Chest radiographs 09/29/2016. Performed concurrently with dedicated thoracic spine radiographs. FINDINGS: Unchanged heart size and mediastinal contours with tortuosity and atherosclerosis of the thoracic aorta. No pulmonary edema or focal airspace disease. No pleural fluid or pneumothorax. There is eventration of the right hemidiaphragm containing air filled colon. Kyphoplasty within T11 and T8 compression fractures. IMPRESSION: Unchanged appearance of the chest. No acute pulmonary process. Stable aortic tortuosity and atherosclerosis. Electronically Signed   By: Jeb Levering M.D.   On: 12/26/2016 21:44   Dg Thoracic Spine 2 View  Result Date: 12/26/2016 CLINICAL DATA:  Worsening back pain. EXAM: THORACIC SPINE 2 VIEWS COMPARISON:  Thoracic spine CT and MRI 09/29/2016 FINDINGS: Kyphoplasty within T10 and T11 compression fractures since prior exam. No evidence of new compression fracture. Upper thoracic spine not well visualized due to overlapping osseous and soft tissue structures. Degenerative disc disease most prominent in the lower thoracic spine. Degenerative change in scoliosis of the visualized lumbar spine is partially included. IMPRESSION: 1. Kyphoplasty within T10 and T11 compression fractures, new since most recent prior imaging. 2. No acute abnormality in the thoracic spine is seen radiographically. Electronically Signed   By: Jeb Levering M.D.   On: 12/26/2016 21:47   Dg Elbow Complete Right  Result Date: 01/22/2017 CLINICAL DATA:  Acute right elbow pain following fall. Initial encounter. EXAM: RIGHT ELBOW - COMPLETE 3+ VIEW COMPARISON:  None. FINDINGS: There is no evidence of acute fracture, subluxation or dislocation. There is no joint effusion identified. Mild degenerative changes are present. IMPRESSION: No acute abnormality. Electronically Signed   By: Margarette Canada M.D.   On: 01/22/2017 10:15   Dg Forearm Left  Result Date: 01/22/2017 CLINICAL DATA:   Left forearm pain following fall. Initial encounter. EXAM: LEFT FOREARM - 2 VIEW COMPARISON:  02/16/2014 radiographs. FINDINGS: No acute fracture, subluxation or dislocation identified. Plate and screw fixation of the distal radius again noted. No focal bony lesions are present. IMPRESSION: No acute bony abnormality. Electronically Signed   By: Margarette Canada M.D.   On: 01/22/2017 10:13   Ct Head Wo Contrast  Result Date: 01/22/2017 CLINICAL DATA:  Unwitnessed fall. Scalp laceration. Hypertension. Dementia. EXAM: CT HEAD WITHOUT CONTRAST CT CERVICAL SPINE WITHOUT CONTRAST TECHNIQUE: Multidetector CT imaging of the head and cervical spine was performed following the standard protocol without intravenous contrast. Multiplanar CT image reconstructions of the cervical spine were also generated. COMPARISON:  01/07/2017 FINDINGS: CT HEAD FINDINGS Brain: No evidence of acute infarction, hemorrhage, hydrocephalus, extra-axial collection or mass lesion/mass effect. Mild chronic small vessel disease again noted. Vascular: No hyperdense vessel or unexpected calcification. Skull: Mild frontal scalp hematoma and soft tissue laceration noted. No evidence of skull fracture. Sinuses/Orbits: No acute finding. Other: None. CT CERVICAL SPINE FINDINGS Skull base and vertebrae: Fractures are seen involving the right anterior arch and left lamina of C1. Type 2 odontoid process fracture is seen with posterior displacement of the odontoid by approximately 8 mm, and posterior subluxation of C1 on C2. Motion artifact is noted, but no other definite acute cervical spine fractures are identified. Soft tissues and spinal canal: Moderate narrowing of the upper cervical spinal canal at C1-2. Disc levels: Moderate to severe degenerative disc disease and facet DJD from levels of C3 to C7. Associated cervical kyphosis. Mild degenerative retrolisthesis at C5-6 measuring 3 mm. Upper chest: Biapical pleural- parenchymal scarring. Other: None  IMPRESSION: Frontal scalp hematoma and laceration. No acute intracranial abnormality. Type 2 odontoid process fracture, and fractures of the right anterior arch and left lamina of C1. Posterior displacement of odontoid process by approximately 8 mm, with posterior subluxation of C1 on C2. Critical Value/emergent results were called by telephone at the time of interpretation on 01/22/2017 at 10:01 am to Dr. Leo Grosser, who verbally acknowledged these results. Electronically Signed   By: Earle Gell M.D.   On: 01/22/2017 10:06   Ct Head Wo Contrast  Result Date: 01/07/2017 CLINICAL DATA:  Unwitnessed fall.  Nasal deformity. EXAM: CT HEAD WITHOUT CONTRAST CT MAXILLOFACIAL WITHOUT CONTRAST CT CERVICAL SPINE WITHOUT CONTRAST TECHNIQUE: Multidetector CT imaging of the head, cervical spine, and maxillofacial structures were performed using the  standard protocol without intravenous contrast. Multiplanar CT image reconstructions of the cervical spine and maxillofacial structures were also generated. COMPARISON:  December 26, 2016 FINDINGS: CT HEAD FINDINGS Brain: No subdural, epidural, or subarachnoid hemorrhage. The ventricles and sulci are normal for age. The cerebellum, brainstem, and basal cisterns are normal. No mass, mass effect, or midline shift. White matter changes identified. No acute cortical ischemia or infarct. Vascular: Atherosclerotic changes in the intracranial carotid arteries. Skull: Nasal bone fractures, described below. No other bony abnormalities. Other: None. CT MAXILLOFACIAL FINDINGS Osseous: Bilateral nasal bone fractures.  No other fractures. Orbits: Negative. No traumatic or inflammatory finding. Sinuses: Clear. Soft tissues: Negative. CT CERVICAL SPINE FINDINGS Alignment: Kyphosis is seen in the cervical spine centered at C4-5. 2.6 mm of anterolisthesis of C7 versus T1. No other significant malalignment. Skull base and vertebrae: Fragmentation of the C7 right facet is favored to be chronic from  previous trauma. No evidence of acute fracture on this study. Soft tissues and spinal canal: No prevertebral fluid or swelling. No visible canal hematoma. Disc levels:  Multilevel degenerative changes. Upper chest: By a focal pleuroparenchymal opacities are most consistent with scarring, right greater than left. No other abnormality seen within the lung apices. Other: No other abnormalities. IMPRESSION: 1. No acute intracranial abnormality. 2. Multiple bilateral nasal bone fractures. 3. No acute fracture or traumatic malalignment in the cervical spine. Minimal anterolisthesis of C7 versus T1 is probably degenerative with no identified fracture. Fragmentation of the right C7 inferior facet has a chronic appearance as well. Electronically Signed   By: Dorise Bullion III M.D   On: 01/07/2017 14:18   Ct Head Wo Contrast  Result Date: 12/26/2016 CLINICAL DATA:  Altered mental status with urinary frequency. Difficulty ambulating. EXAM: CT HEAD WITHOUT CONTRAST TECHNIQUE: Contiguous axial images were obtained from the base of the skull through the vertex without intravenous contrast. COMPARISON:  MRI brain 08/18/2016 FINDINGS: Brain: Ventricles, cisterns and other CSF spaces are within normal. There is no mass, mass effect, shift of midline structures or acute hemorrhage. No evidence of acute infarction. There is evidence of chronic ischemic microvascular disease. Vascular: Mild calcified plaque over the cavernous segment of the internal carotid arteries. Skull: Within normal Sinuses/Orbits: Within normal. Other: None. IMPRESSION: No acute intracranial findings. Chronic ischemic microvascular disease. Electronically Signed   By: Marin Olp M.D.   On: 12/26/2016 21:33   Ct Cervical Spine Wo Contrast  Result Date: 01/22/2017 CLINICAL DATA:  Unwitnessed fall. Scalp laceration. Hypertension. Dementia. EXAM: CT HEAD WITHOUT CONTRAST CT CERVICAL SPINE WITHOUT CONTRAST TECHNIQUE: Multidetector CT imaging of the head  and cervical spine was performed following the standard protocol without intravenous contrast. Multiplanar CT image reconstructions of the cervical spine were also generated. COMPARISON:  01/07/2017 FINDINGS: CT HEAD FINDINGS Brain: No evidence of acute infarction, hemorrhage, hydrocephalus, extra-axial collection or mass lesion/mass effect. Mild chronic small vessel disease again noted. Vascular: No hyperdense vessel or unexpected calcification. Skull: Mild frontal scalp hematoma and soft tissue laceration noted. No evidence of skull fracture. Sinuses/Orbits: No acute finding. Other: None. CT CERVICAL SPINE FINDINGS Skull base and vertebrae: Fractures are seen involving the right anterior arch and left lamina of C1. Type 2 odontoid process fracture is seen with posterior displacement of the odontoid by approximately 8 mm, and posterior subluxation of C1 on C2. Motion artifact is noted, but no other definite acute cervical spine fractures are identified. Soft tissues and spinal canal: Moderate narrowing of the upper cervical spinal canal at C1-2. Disc  levels: Moderate to severe degenerative disc disease and facet DJD from levels of C3 to C7. Associated cervical kyphosis. Mild degenerative retrolisthesis at C5-6 measuring 3 mm. Upper chest: Biapical pleural- parenchymal scarring. Other: None IMPRESSION: Frontal scalp hematoma and laceration. No acute intracranial abnormality. Type 2 odontoid process fracture, and fractures of the right anterior arch and left lamina of C1. Posterior displacement of odontoid process by approximately 8 mm, with posterior subluxation of C1 on C2. Critical Value/emergent results were called by telephone at the time of interpretation on 01/22/2017 at 10:01 am to Dr. Leo Grosser, who verbally acknowledged these results. Electronically Signed   By: Earle Gell M.D.   On: 01/22/2017 10:06   Ct Cervical Spine Wo Contrast  Result Date: 01/07/2017 CLINICAL DATA:  Unwitnessed fall.  Nasal  deformity. EXAM: CT HEAD WITHOUT CONTRAST CT MAXILLOFACIAL WITHOUT CONTRAST CT CERVICAL SPINE WITHOUT CONTRAST TECHNIQUE: Multidetector CT imaging of the head, cervical spine, and maxillofacial structures were performed using the standard protocol without intravenous contrast. Multiplanar CT image reconstructions of the cervical spine and maxillofacial structures were also generated. COMPARISON:  December 26, 2016 FINDINGS: CT HEAD FINDINGS Brain: No subdural, epidural, or subarachnoid hemorrhage. The ventricles and sulci are normal for age. The cerebellum, brainstem, and basal cisterns are normal. No mass, mass effect, or midline shift. White matter changes identified. No acute cortical ischemia or infarct. Vascular: Atherosclerotic changes in the intracranial carotid arteries. Skull: Nasal bone fractures, described below. No other bony abnormalities. Other: None. CT MAXILLOFACIAL FINDINGS Osseous: Bilateral nasal bone fractures.  No other fractures. Orbits: Negative. No traumatic or inflammatory finding. Sinuses: Clear. Soft tissues: Negative. CT CERVICAL SPINE FINDINGS Alignment: Kyphosis is seen in the cervical spine centered at C4-5. 2.6 mm of anterolisthesis of C7 versus T1. No other significant malalignment. Skull base and vertebrae: Fragmentation of the C7 right facet is favored to be chronic from previous trauma. No evidence of acute fracture on this study. Soft tissues and spinal canal: No prevertebral fluid or swelling. No visible canal hematoma. Disc levels:  Multilevel degenerative changes. Upper chest: By a focal pleuroparenchymal opacities are most consistent with scarring, right greater than left. No other abnormality seen within the lung apices. Other: No other abnormalities. IMPRESSION: 1. No acute intracranial abnormality. 2. Multiple bilateral nasal bone fractures. 3. No acute fracture or traumatic malalignment in the cervical spine. Minimal anterolisthesis of C7 versus T1 is probably degenerative  with no identified fracture. Fragmentation of the right C7 inferior facet has a chronic appearance as well. Electronically Signed   By: Dorise Bullion III M.D   On: 01/07/2017 14:18   Dg Chest Port 1 View  Result Date: 01/23/2017 CLINICAL DATA:  Shortness of breath.  Subsequent encounter. EXAM: PORTABLE CHEST 1 VIEW COMPARISON:  01/22/2017 FINDINGS: There is persistent linear opacity at the left lung base consistent with atelectasis. Lung volumes are low. Lungs are otherwise clear. Cardiac silhouette is normal in size. No mediastinal or hilar masses. No pleural effusion or pneumothorax. IMPRESSION: 1. No significant change from the previous day's study. Persistent left base atelectasis. Electronically Signed   By: Lajean Manes M.D.   On: 01/23/2017 10:00   Dg Hand Complete Right  Result Date: 01/22/2017 CLINICAL DATA:  Patient found down today. EXAM: RIGHT HAND - COMPLETE 3+ VIEW COMPARISON:  None. FINDINGS: No acute bony or joint abnormality is identified. Advanced first [place] osteoarthritis is seen. Chondrocalcinosis is noted. IMPRESSION: No acute abnormality. Advanced first [place] osteoarthritis. Electronically Signed   By: Marcello Moores  Dalessio M.D.   On: 01/22/2017 10:12   Dg Hip Unilat W Or Wo Pelvis 2-3 Views Left  Result Date: 01/22/2017 CLINICAL DATA:  Unwitnessed fall, found on floor. Left hip abrasions. EXAM: DG HIP (WITH OR WITHOUT PELVIS) 2-3V LEFT COMPARISON:  05/04/2016. FINDINGS: Oral contrast is seen in the colon, obscuring the majority of the sacrum. No fracture dislocation. Mild subchondral sclerosis and collar osteophytosis in the left hip. Degenerative and postoperative changes, as well as scoliosis, in the spine. IMPRESSION: 1. No acute findings. 2. Mild left hip osteoarthritis. Electronically Signed   By: Lorin Picket M.D.   On: 01/22/2017 10:13   Ct Maxillofacial Wo Cm  Result Date: 01/07/2017 CLINICAL DATA:  Unwitnessed fall.  Nasal deformity. EXAM: CT HEAD WITHOUT CONTRAST CT  MAXILLOFACIAL WITHOUT CONTRAST CT CERVICAL SPINE WITHOUT CONTRAST TECHNIQUE: Multidetector CT imaging of the head, cervical spine, and maxillofacial structures were performed using the standard protocol without intravenous contrast. Multiplanar CT image reconstructions of the cervical spine and maxillofacial structures were also generated. COMPARISON:  December 26, 2016 FINDINGS: CT HEAD FINDINGS Brain: No subdural, epidural, or subarachnoid hemorrhage. The ventricles and sulci are normal for age. The cerebellum, brainstem, and basal cisterns are normal. No mass, mass effect, or midline shift. White matter changes identified. No acute cortical ischemia or infarct. Vascular: Atherosclerotic changes in the intracranial carotid arteries. Skull: Nasal bone fractures, described below. No other bony abnormalities. Other: None. CT MAXILLOFACIAL FINDINGS Osseous: Bilateral nasal bone fractures.  No other fractures. Orbits: Negative. No traumatic or inflammatory finding. Sinuses: Clear. Soft tissues: Negative. CT CERVICAL SPINE FINDINGS Alignment: Kyphosis is seen in the cervical spine centered at C4-5. 2.6 mm of anterolisthesis of C7 versus T1. No other significant malalignment. Skull base and vertebrae: Fragmentation of the C7 right facet is favored to be chronic from previous trauma. No evidence of acute fracture on this study. Soft tissues and spinal canal: No prevertebral fluid or swelling. No visible canal hematoma. Disc levels:  Multilevel degenerative changes. Upper chest: By a focal pleuroparenchymal opacities are most consistent with scarring, right greater than left. No other abnormality seen within the lung apices. Other: No other abnormalities. IMPRESSION: 1. No acute intracranial abnormality. 2. Multiple bilateral nasal bone fractures. 3. No acute fracture or traumatic malalignment in the cervical spine. Minimal anterolisthesis of C7 versus T1 is probably degenerative with no identified fracture. Fragmentation  of the right C7 inferior facet has a chronic appearance as well. Electronically Signed   By: Dorise Bullion III M.D   On: 01/07/2017 14:18    Microbiology: No results found for this or any previous visit (from the past 240 hour(s)).   Labs: CBC:  Recent Labs Lab 01/22/17 1211 01/23/17 0957  WBC 16.1* 12.0*  NEUTROABS 14.5* 10.6*  HGB 11.6* 10.2*  HCT 35.0* 31.9*  MCV 98.3 99.7  PLT 263 0000000   Basic Metabolic Panel:  Recent Labs Lab 01/22/17 1211 01/23/17 0957  NA 138 136  K 3.9 3.6  CL 109 108  CO2 22 22  GLUCOSE 126* 142*  BUN 13 16  CREATININE 0.61 0.56  CALCIUM 8.7* 8.4*  MG  --  1.8   Liver Function Tests:  Recent Labs Lab 01/22/17 1211 01/23/17 0957  AST 29 34  ALT 14 7*  ALKPHOS 56 53  BILITOT 0.6 0.5  PROT 6.2* 5.6*  ALBUMIN 3.5 3.1*   Time spent: 30 minutes  Signed:  PATEL, PRANAV  [facility]  01/25/2017  , 10:53 AM

## 2017-01-25 NOTE — Progress Notes (Signed)
Report given to nurse at [facility]. Family aware of disposition. Transport to facility per [facility].   Ave Filter, RN

## 2017-01-25 NOTE — Clinical Social Work Note (Signed)
Clinical Social Worker facilitated patient discharge including contacting patient family and facility to confirm patient discharge plans.  Clinical information faxed to facility and family agreeable with plan.  CSW arranged ambulance transport via [facility] to [facility] .  RN to call for report prior to discharge.  Clinical Social Worker will sign off for now as social work intervention is no longer needed. Please consult [place] again if new need arises.  [address], [place]

## 2017-01-25 NOTE — Consult Note (Signed)
[facility] room available to day for Mrs. Sirmon. Met with spouse and daughter to complete paper work. CSW and RN aware. Appreciate report from PMT NP.  Please fax discharge summary to [phone].  RN please call report to [phone].  Thank you,  Erling Conte, LCSW [phone]

## 2017-01-25 NOTE — Progress Notes (Signed)
Daily Progress Note   Patient Name: Tara Neal       Date: 01/25/2017 DOB: 1941-03-14  Age: 76 y.o. MRN#: [id number] Attending Physician: Lavina Hamman, MD Primary Care Physician: [name], MD Admit Date: 01/22/2017  Reason for Consultation/Follow-up: Establishing goals of care r/t adult failure to thrive for months now further complicated by type II odontoid fracture and she is not a candidate for surgery.    Subjective: Tara Neal is sitting up in bed and NT attempting to feed her.   Length of Stay: 2  Current Medications: Scheduled Meds:  . amLODipine  5 mg Oral Daily  . benazepril  40 mg Oral Daily  . carbidopa-levodopa  1 tablet Oral TID  . divalproex  125 mg Oral BID  . famotidine  20 mg Oral BID  . feeding supplement (ENSURE ENLIVE)  237 mL Oral BID BM  . fesoterodine  8 mg Oral Daily  . gabapentin  300 mg Oral TID  . latanoprost  1 drop Both Eyes QHS  . metoprolol tartrate  25 mg Oral BID  . morphine CONCENTRATE  5 mg Oral Q6H  . senna-docusate  1 tablet Oral Daily  . sodium chloride flush  3 mL Intravenous Q12H  . sodium chloride flush  3 mL Intravenous Q12H  . timolol  1 drop Both Eyes BID  . venlafaxine XR  150 mg Oral Q breakfast    Continuous Infusions:   PRN Meds: sodium chloride, acetaminophen **OR** acetaminophen, glycopyrrolate **OR** glycopyrrolate **OR** glycopyrrolate, haloperidol **OR** haloperidol **OR** haloperidol lactate, LORazepam **OR** LORazepam **OR** LORazepam, morphine CONCENTRATE **OR** morphine injection, ondansetron **OR** ondansetron (ZOFRAN) IV, sodium chloride flush  Physical Exam  Constitutional: She appears well-developed.  HENT:  Head: Normocephalic and atraumatic.  Cardiovascular: Normal rate.   Pulmonary/Chest: No accessory  muscle usage. Tachypnea noted. She is in respiratory distress.  Abdominal: Soft. Normal appearance.  Neurological: She is alert. She is disoriented.  Nursing note and vitals reviewed.           Vital Signs: BP (!) 157/82 (BP Location: Right Arm)   Pulse 78   Temp 98.8 F (37.1 C) (Axillary)   Resp 20   SpO2 98%  SpO2: SpO2: 98 % O2 Device: O2 Device: Not Delivered O2 Flow Rate: O2 Flow Rate (L/min): 1 L/min  Intake/output  summary: No intake or output data in the 24 hours ending 01/25/17 0852 LBM:   Baseline Weight:   Most recent weight:         Palliative Assessment/Data:    Flowsheet Rows   Flowsheet Row Most Recent Value  Intake Tab  Referral Department  Hospitalist  Unit at Time of Referral  Med/Surg Unit  Palliative Care Primary Diagnosis  Neurology  Date Notified  01/23/17  Palliative Care Type  Return patient Palliative Care  Reason for referral  Clarify Goals of Care  Date of Admission  01/22/17  Date first seen by Palliative Care  01/24/17  # of days Palliative referral response time  1 Day(s)  # of days IP prior to Palliative referral  1  Clinical Assessment  Psychosocial & Spiritual Assessment  Palliative Care Outcomes      Patient Active Problem List   Diagnosis Date Noted  . Poor appetite   . Palliative care encounter   . Odontoid fracture with type II morphology ([place]) 01/22/2017  . Parkinson's disease ([place]) 01/22/2017  . Laceration of scalp 01/22/2017  . Closed displaced fracture of first cervical vertebra ([place])   . Community acquired pneumonia   . Encounter for palliative care   . Goals of care, counseling/discussion   . Adult failure to thrive 01/09/2017  . CAP (community acquired pneumonia) 01/07/2017  . Suicidal ideation 01/07/2017  . Rheumatoid arthritis ([place]) 01/07/2017  . OAB (overactive bladder) 12/31/2016  . 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 Depression 11/20/2016  . Dementia without behavioral  disturbance 11/20/2016  . Dehydration 10/02/2016  . Thoracic compression fracture ([place]) 09/30/2016  . Acute diarrhea 09/30/2016  . Acute cystitis without hematuria   . Fall   . Urinary retention 07/08/2016  . UTI (lower urinary tract infection) 05/15/2016  . Bilateral hand pain 06/05/2015  . Gait disorder 04/15/2015  . Orthostatic hypotension 04/10/2015  . Recurrent falls while walking 04/10/2015  . Memory loss 04/10/2015  . Abnormal x-ray 02/20/2015  . Cough 11/18/2014  . Thrush, oral 04/04/2014  . Umbilical hernia 0000000  . Benign skin lesion 07/24/2013  . Anemia, unspecified 12/21/2012  . Insomnia 04/22/2012  . Right hip pain 12/13/2011  . Right knee pain 12/13/2011  . Preventative health care 12/11/2011  . DIVERTICULOSIS, COLON 11/18/2010  . ARTERIOVENOUS MALFORMATION, COLON 11/18/2010  . ANXIETY 12/02/2009  . COPD (chronic obstructive pulmonary disease) ([place]) 05/27/2009  . Diabetes ([place]) 02/06/2009  . FATIGUE 12/12/2008  . Hyperlipidemia 10/01/2007  . OBESITY, MILD 10/01/2007  . MIGRAINE HEADACHE 10/01/2007  . Essential hypertension 10/01/2007  . 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 Pneumothorax 10/01/2007  . GERD 10/01/2007  . Osteoarthritis 10/01/2007  . LOW BACK PAIN 10/01/2007  . OSTEOPOROSIS 10/01/2007    Palliative Care Assessment & Plan   HPI: 76 y.o. female  with past medical history of HTN, Parkinson's disease with recurrent fall, dementia, COPD, recurrent pneumonia admitted on 01/22/2017 with fall and type II odontoid fracture.    Assessment: Tara Neal appears uncomfortable today. NT says that she was refusing her breakfast. She is not as verbal today but appears distressed. Discussed with RN and will increase scheduled pain medication. No family at bedside. Discussed GOC and discussion from family with hospice representative. Hopeful for transition to [facility] today.   Recommendations/Plan:  Pain/dyspnea: Roxanol 5 mg every 4  hours scheduled (may need increased). Roxanol 2.5-5 mg every 2 hours prn.   Anxiety: Ativan prn.   Bowel: Senokot  1 tablet daily.   Comfort feeds dys 3, thin liquids.   Goals of Care and Additional Recommendations:  Limitations on Scope of Treatment: Full Comfort Care  Code Status:  DNR  Prognosis:   < 2 weeks with increasing symptom management and declining functional status and intake.    Discharge Planning:  Hospice facility   Thank you for allowing the Palliative Medicine Team to assist in the care of this patient.   Total Time 36min Prolonged Time Billed  no       Greater than 50%  of this time was spent counseling and coordinating care related to the above assessment and plan.  Vinie Sill, NP Palliative Medicine Team Pager # [phone] (M-F 8a-5p) Team Phone # [phone] (Nights/Weekends)

## 2017-01-25 NOTE — Care Management Note (Signed)
Case Management Note  Patient Details  Name: Tara Neal MRN: [id number] Date of Birth: 08/16/1941  Subjective/Objective:                    Action/Plan: Pt discharging to [facility] today. No further needs per CM.   Expected Discharge Date:  01/25/17               Expected Discharge Plan:  [place]  In-House Referral:     Discharge planning Services     Post Acute Care Choice:    Choice offered to:     DME Arranged:    DME Agency:     HH Arranged:    HH Agency:     Status of Service:  Completed, signed off  If discussed at [employer] [facility] [employer], dates discussed:    Additional Comments:  Pollie Friar, RN 01/25/2017, 11:50 AM

## 2017-01-26 ENCOUNTER — Telehealth: Payer: Self-pay | Admitting: *Deleted

## 2017-01-26 NOTE — Telephone Encounter (Signed)
Pt was on the TCM list admitted for 2/24 for Odontoid fracture with type II morphology. Pt was D/C 2/27, and sent to hospice @ [facility]...Tara Neal

## 2017-01-26 NOTE — Consult Note (Signed)
   [facility] CM Inpatient Consult   01/26/2017  LAKRESHA DIERKING 1941-01-15 [id number]   Chart reviewed. Noted Ms. Damman went to [facility] facility on 01/25/17. [facility] Care Management no longer appropriate.  Marthenia Rolling, MSN-Ed, RN,BSN [facility] Liaison [phone]

## 2017-02-09 ENCOUNTER — Ambulatory Visit: Payer: Medicare Other | Admitting: Podiatry

## 2017-02-09 ENCOUNTER — Ambulatory Visit: Payer: Medicare Other | Admitting: Neurology

## 2017-02-27 DEATH — deceased
